# Patient Record
Sex: Female | Born: 1945 | Race: White | Hispanic: No | Marital: Married | Smoking: Former smoker
Health system: Southern US, Community
[De-identification: ages and names within clinical notes are randomized; demographics above are authoritative.]

## PROBLEM LIST (undated history)

## (undated) DIAGNOSIS — C50919 Malignant neoplasm of unspecified site of unspecified female breast: Secondary | ICD-10-CM

## (undated) DIAGNOSIS — E109 Type 1 diabetes mellitus without complications: Secondary | ICD-10-CM

## (undated) DIAGNOSIS — F419 Anxiety disorder, unspecified: Secondary | ICD-10-CM

## (undated) DIAGNOSIS — I10 Essential (primary) hypertension: Secondary | ICD-10-CM

## (undated) DIAGNOSIS — E222 Syndrome of inappropriate secretion of antidiuretic hormone: Secondary | ICD-10-CM

## (undated) DIAGNOSIS — I4891 Unspecified atrial fibrillation: Secondary | ICD-10-CM

## (undated) DIAGNOSIS — Z923 Personal history of irradiation: Secondary | ICD-10-CM

## (undated) DIAGNOSIS — E039 Hypothyroidism, unspecified: Secondary | ICD-10-CM

## (undated) DIAGNOSIS — I5030 Unspecified diastolic (congestive) heart failure: Secondary | ICD-10-CM

## (undated) DIAGNOSIS — N83209 Unspecified ovarian cyst, unspecified side: Secondary | ICD-10-CM

## (undated) DIAGNOSIS — Z9641 Presence of insulin pump (external) (internal): Secondary | ICD-10-CM

## (undated) DIAGNOSIS — E785 Hyperlipidemia, unspecified: Secondary | ICD-10-CM

## (undated) DIAGNOSIS — Z79811 Long term (current) use of aromatase inhibitors: Secondary | ICD-10-CM

## (undated) HISTORY — DX: Unspecified ovarian cyst, unspecified side: N83.209

## (undated) HISTORY — PX: TONSILLECTOMY: SUR1361

## (undated) HISTORY — PX: COLONOSCOPY: SHX174

## (undated) HISTORY — DX: Personal history of irradiation: Z92.3

## (undated) HISTORY — DX: Essential (primary) hypertension: I10

## (undated) HISTORY — DX: Syndrome of inappropriate secretion of antidiuretic hormone: E22.2

## (undated) HISTORY — PX: APPENDECTOMY: SHX54

## (undated) HISTORY — DX: Long term (current) use of aromatase inhibitors: Z79.811

## (undated) HISTORY — DX: Malignant neoplasm of unspecified site of unspecified female breast: C50.919

## (undated) HISTORY — PX: OVARIAN CYST SURGERY: SHX726

---

## 2010-11-06 ENCOUNTER — Encounter: Payer: Self-pay | Admitting: Otolaryngology

## 2012-01-25 ENCOUNTER — Other Ambulatory Visit: Payer: Self-pay | Admitting: Radiology

## 2012-01-25 DIAGNOSIS — C50912 Malignant neoplasm of unspecified site of left female breast: Secondary | ICD-10-CM

## 2012-01-28 ENCOUNTER — Other Ambulatory Visit: Payer: Self-pay | Admitting: *Deleted

## 2012-01-28 ENCOUNTER — Telehealth: Payer: Self-pay | Admitting: *Deleted

## 2012-01-28 DIAGNOSIS — C50419 Malignant neoplasm of upper-outer quadrant of unspecified female breast: Secondary | ICD-10-CM

## 2012-01-28 NOTE — Telephone Encounter (Signed)
Confirmed [facility] for 01/30/12 at 0800 .  Instructions and contact information given.  

## 2012-01-29 ENCOUNTER — Ambulatory Visit
Admission: RE | Admit: 2012-01-29 | Discharge: 2012-01-29 | Disposition: A | Discharge status: Home / Self Care | Payer: 59 | Source: Ambulatory Visit | Attending: Radiology | Admitting: Radiology

## 2012-01-29 DIAGNOSIS — C50912 Malignant neoplasm of unspecified site of left female breast: Secondary | ICD-10-CM

## 2012-01-29 MED ORDER — GADOBENATE DIMEGLUMINE 529 MG/ML IV SOLN
14.0000 mL | Freq: Once | INTRAVENOUS | Status: AC | PRN
  Administered 2012-01-29: 14 mL via INTRAVENOUS

## 2012-01-30 ENCOUNTER — Ambulatory Visit: Payer: 59 | Admitting: Oncology

## 2012-01-30 ENCOUNTER — Telehealth: Payer: Self-pay | Admitting: Oncology

## 2012-01-30 ENCOUNTER — Ambulatory Visit (HOSPITAL_BASED_OUTPATIENT_CLINIC_OR_DEPARTMENT_OTHER): Payer: 59 | Admitting: General Surgery

## 2012-01-30 ENCOUNTER — Encounter (INDEPENDENT_AMBULATORY_CARE_PROVIDER_SITE_OTHER): Payer: Self-pay | Admitting: General Surgery

## 2012-01-30 ENCOUNTER — Encounter: Payer: Self-pay | Admitting: Oncology

## 2012-01-30 ENCOUNTER — Ambulatory Visit: Payer: 59

## 2012-01-30 ENCOUNTER — Encounter: Payer: Self-pay | Admitting: Radiation Oncology

## 2012-01-30 ENCOUNTER — Encounter: Payer: Self-pay | Admitting: *Deleted

## 2012-01-30 ENCOUNTER — Ambulatory Visit
Admission: RE | Admit: 2012-01-30 | Discharge: 2012-01-30 | Disposition: A | Discharge status: Home / Self Care | Payer: Managed Care, Other (non HMO) | Source: Ambulatory Visit | Attending: Radiation Oncology | Admitting: Radiation Oncology

## 2012-01-30 ENCOUNTER — Other Ambulatory Visit: Payer: 59 | Admitting: Lab

## 2012-01-30 ENCOUNTER — Ambulatory Visit: Payer: Managed Care, Other (non HMO) | Attending: General Surgery | Admitting: Physical Therapy

## 2012-01-30 VITALS — BP 145/87 | HR 67 | Temp 98.1°F | Ht 63.0 in | Wt 152.8 lb

## 2012-01-30 DIAGNOSIS — E119 Type 2 diabetes mellitus without complications: Secondary | ICD-10-CM

## 2012-01-30 DIAGNOSIS — C50419 Malignant neoplasm of upper-outer quadrant of unspecified female breast: Secondary | ICD-10-CM

## 2012-01-30 DIAGNOSIS — R293 Abnormal posture: Secondary | ICD-10-CM | POA: Insufficient documentation

## 2012-01-30 DIAGNOSIS — C50919 Malignant neoplasm of unspecified site of unspecified female breast: Secondary | ICD-10-CM | POA: Insufficient documentation

## 2012-01-30 DIAGNOSIS — E7800 Pure hypercholesterolemia, unspecified: Secondary | ICD-10-CM

## 2012-01-30 DIAGNOSIS — IMO0001 Reserved for inherently not codable concepts without codable children: Secondary | ICD-10-CM | POA: Insufficient documentation

## 2012-01-30 DIAGNOSIS — Z170 Estrogen receptor positive status [ER+]: Secondary | ICD-10-CM

## 2012-01-30 LAB — CBC WITH DIFFERENTIAL/PLATELET
Basophils Absolute: 0.1 10*3/uL (ref 0.0–0.1)
Eosinophils Absolute: 0 10*3/uL (ref 0.0–0.5)
HGB: 13.1 g/dL (ref 11.6–15.9)
LYMPH%: 42.3 % (ref 14.0–49.7)
MONO#: 0.4 10*3/uL (ref 0.1–0.9)
NEUT#: 1.3 10*3/uL — ABNORMAL LOW (ref 1.5–6.5)
Platelets: 172 10*3/uL (ref 145–400)
RBC: 4.37 10*6/uL (ref 3.70–5.45)
WBC: 3.1 10*3/uL — ABNORMAL LOW (ref 3.9–10.3)
nRBC: 0 % (ref 0–0)

## 2012-01-30 LAB — COMPREHENSIVE METABOLIC PANEL
ALT: 29 U/L (ref 0–35)
CO2: 29 mEq/L (ref 19–32)
Calcium: 8.9 mg/dL (ref 8.4–10.5)
Chloride: 93 mEq/L — ABNORMAL LOW (ref 96–112)
Glucose, Bld: 286 mg/dL — ABNORMAL HIGH (ref 70–99)
Sodium: 130 mEq/L — ABNORMAL LOW (ref 135–145)
Total Protein: 6.7 g/dL (ref 6.0–8.3)

## 2012-01-30 NOTE — Progress Notes (Signed)
Gina [name] [id number] [id number] 66 y.o. 01/30/2012 12:04 PM  CC Dr. Glenna Fellows Dr. Lonie Peak Dr. Reather Littler Dr. 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 Patient is seen in the multidisciplinary clinic for discussion of treatment options.  STAGE:   Cancer of upper-outer quadrant of female breast   Primary site: Breast (Left)   Staging method: AJCC 7th Edition   Clinical: Stage IIA (T2, N0, cM0)   Summary: Stage IIA (T2, N0, cM0)  REFERRING PHYSICIAN: Dr. Sharlet Salina Hoxworth  HISTORY OF PRESENT ILLNESS:  Gina [name] is a 66 y.o. female.  With medical history significant for diabetes hypertension hypercholesterolemia. Patient has never had a mammogram previously. Recently she palpated a mass in her left breast. Because of this he she presented for a mammogram. The mammogram showed a spiculated mass in the upper outer quadrant of the left breast measuring about 1.8 cm. Subsequent ultrasound showed a well circumscribed hypoechoic mass at the 6:00 position 3 cm from the nipple. Patient went on to have a needle core biopsy performed on 01/24/2012. The pathology showed a invasive ductal carcinoma grade 1 ER positive PR positive HER-2/neu negative proliferation marker 10%. She had an MRI of the breasts performed. The MRI showed a round had true Beverely Low is an enhancing mass with spiculated margins in the 3:00 position of the left breast measuring 2.1 x 1.8 x 1.8 cm. There was no axillary or internal mammary adenopathy and no other masses or enhancement were seen in either breasts. Patient now presents to the multidisciplinary clinic for discussion of treatment options. Her case was discussed pathology and radiology was reviewed personally by me.She is without any complaints related to her breast cancer other than anxiety over her  treatment options.   Past Medical History: Past Medical History  Diagnosis Date  . Ovarian cyst   . Diabetes mellitus     Insulin pump  . Hypertension     Past Surgical History: Past Surgical History  Procedure Date  . Tonsillectomy     Family History: Family History  Problem Relation Age of Onset  . Cancer Mother     Social History History  Substance Use Topics  . Smoking status: Never Smoker   . Smokeless tobacco: Not on file  . Alcohol Use: No    Allergies: No Known Allergies  Current Medications: Current medications are reviewed and they are recorded separately into the electronic medical record.  OB/GYN History: Menarche at age 63 patient underwent menopause at 29. She was on hormone replacement therapy for a short while orally but then did not take it she has been using progesterone cream and will now discontinue it. She has had 3 term pregnancies first one at age 29. Her last colonoscopy was in 2011 bone density 2012 last Pap smear was about 5 years ago first mammogram was on 12/27/2011.  ECOG PERFORMANCE STATUS: 0 - Asymptomatic  Genetic Counseling/testing: Patient's family history is significant for mom with history of breast cancer at age 58 she died at 63 no other family history of malignancies. Therefore genetic counseling and testing has not been recommended at this time  REVIEW OF SYSTEMS:  Constitutional: positive for fatigue Ears, nose, mouth, throat, and face: positive for nasal congestion Respiratory: negative Cardiovascular: negative Gastrointestinal: negative Genitourinary:negative Integument/breast: positive for breast lump and breast tenderness Hematologic/lymphatic: negative Musculoskeletal:positive for arthralgias Neurological: negative Endocrine: positive for  diabetic symptoms including increased fatigue  PHYSICAL EXAMINATION: Blood pressure 145/87, pulse 67, temperature 98.1 F (36.7 C), temperature source Oral, height 5\' 3"  (1.6  m), weight 152 lb 12.8 oz (69.31 kg).  ZOX:WRUEA, healthy, no distress, well nourished and well developed SKIN: skin color, texture, turgor are normal HEAD: Normocephalic, No masses, lesions, tenderness or abnormalities EYES: PERRLA, EOMI, Conjunctiva are pink and non-injected, sclera clear EARS: External ears normal OROPHARYNX:no exudate, no erythema and lips, buccal mucosa, and tongue normal  NECK: no adenopathy, thyroid normal size, non-tender, without nodularity LYMPH:  no palpable lymphadenopathy, no hepatosplenomegaly BREAST:abnormal mass palpable In the left breast measuring about 2 cm there is area of ecchymosis from the biopsy site. LUNGS: clear to auscultation and percussion HEART: regular rate & rhythm, no murmurs and no gallops ABDOMEN:abdomen soft, non-tender, normal bowel sounds and no masses or organomegaly BACK: No CVA tenderness EXTREMITIES:no edema, no clubbing, no cyanosis  NEURO: alert & oriented x 3 with fluent speech, no focal motor/sensory deficits, gait normal, reflexes normal and symmetric   STUDIES/RESULTS: Mr Breast Bilateral W Wo Contrast  01/29/2012  *RADIOLOGY REPORT*  Clinical Data: New diagnosis left sided breast cancer  BILATERAL BREAST MRI WITH AND WITHOUT CONTRAST  Technique: Multiplanar, multisequence MR images of both breasts were obtained prior to and following the intravenous administration of 14ml of Multihance.  Three dimensional images were evaluated at the independent DynaCad workstation.  Comparison:  Mammogram dated 01/24/2012  Findings: Foci of nonspecific enhancement seen bilaterally.  A round, heterogeneously enhancing mass with spiculated margins is seen in the 3 o'clock position of the left breast, posteriorly measuring 2.1 x 1.8 x 1.8 cm.  A biopsy clip is seen in association with the mass.  No edema or enhancement is seen extending to the chest wall. No other suspicious mass or enhancement seen in either breast.  There is no axillary or  internal mammary adenopathy.  IMPRESSION: Known malignancy, left breast as detailed above.  No MRI specific evidence of malignancy, right breast.  THREE-DIMENSIONAL MR IMAGE RENDERING ON INDEPENDENT WORKSTATION:  Three-dimensional MR images were rendered by post-processing of the original MR data on an independent workstation.  The three- dimensional MR images were interpreted, and findings were reported in the accompanying complete MRI report for this study.  BI-RADS CATEGORY 6:  Known biopsy-proven malignancy - appropriate action should be taken.  Original Report Authenticated By: Hiram Gash, M.D.     LABS:    Chemistry      Component Value Date/Time   NA 130* 01/30/2012 0814   K 4.3 01/30/2012 0814   CL 93* 01/30/2012 0814   CO2 29 01/30/2012 0814   BUN 12 01/30/2012 0814   CREATININE 0.68 01/30/2012 0814      Component Value Date/Time   CALCIUM 8.9 01/30/2012 0814   ALKPHOS 85 01/30/2012 0814   AST 30 01/30/2012 0814   ALT 29 01/30/2012 0814   BILITOT 0.5 01/30/2012 0814      Lab Results  Component Value Date   WBC 3.1* 01/30/2012   HGB 13.1 01/30/2012   HCT 39.5 01/30/2012   MCV 90.4 01/30/2012   PLT 172 01/30/2012       PATHOLOGY: Needle core biopsy of left breast upper outer quadrant at 2:00 to 3:00 position revealing invasive ductal carcinoma grade 1 ER positive PR positive HER-2/neu negative proliferation marker 10%   ASSESSMENT    66 year old female with  #1 2.1 cm invasive ductal carcinoma of the left breast found on self  breast examination. Patient's clinical staging is T2, N0, M0 (stage II). Core needle biopsy revealed invasive ductal carcinoma grade 1 ER positive PR positive HER-2/neu negative proliferation marker 10%. Patient is seen in the multidisciplinary breast clinic for discussion of treatment options.  #2 diabetes patient is on insulin.  #3 hypertension hypercholesterolemia.  PLAN:    #1 patient and I discussed her pathology as well as radiology and  I gave her written material. Patient was recommended a lumpectomy with sentinel node biopsy. Post lumpectomy she would be a candidate for radiation therapy. Post Dr. Johna Sheriff and Dr. Doristine Devoid discussed these options respectively.  #2 adjuvant therapy was discussed. We discussed Oncotype DX 2 evaluated breast cancer recurrence score. Although my index of suspicion is that her recurrence score with most likely fall into the low risk category due to febrile goal prognostic features. We will order Oncotype DX however per patient wishes. Her antiestrogen therapy would most likely consist of an aromatase inhibitor. Risks and benefits of this were discussed with the patient as well. We discussed all 3 of the eyes. We also discussed tamoxifen.  #3 I spent extensive time discussing with the patient the standards of care for treatment of stage II hormone positive breast cancer. Per NCCN guidelines patient would be a candidate for endocrine therapy. Oncotype DX would evaluate whether she should receive chemotherapy or not.  #4 patient will be seen back in 4-5 weeks time after her surgery.        Thank you so much for allowing me to participate in the care of [facility]. I will continue to follow up the patient with you and assist in her care.  All questions were answered. The patient knows to call the clinic with any problems, questions or concerns. We can certainly see the patient much sooner if necessary.  I spent 60 minutes counseling the patient face to face. The total time spent in the appointment was 60 minutes.  Drue Second, MD Medical/Oncology [facility] [phone] (beeper) [phone] (Office)  01/30/2012, 12:04 PM 01/30/2012, 12:04 PM

## 2012-01-30 NOTE — Progress Notes (Signed)
[facility] Radiation Oncology NEW PATIENT EVALUATION  Name: Gina [name] MRN: [id number]  Date:   01/30/2012           DOB: August 14, 1946  Status: outpatient  REFERRING PHYSICIAN: Hoxworth, Lorne Skeens, MD   DIAGNOSIS: Clinical T2, N0, M0 Left breast cancer, upper outer quadrant, invasive ductal carcinoma, ER/PR positive HER-2/neu negative, grade 1    HISTORY OF PRESENT ILLNESS:  Gina [name] is a 66 y.o. female who self palpated a lump in her left breast about 16 months ago. She underwent her first mammogram to work this up in 12/27/2011. This demonstrated a  suspicious spiculated mass in the upper-outer quadrant of the left breast. On mammography it was 1.8 cm in greatest dimension. There was also a small circumscribed lesion appreciated in the right breast that was felt to be benign but malignancy could not be excluded. The patient underwent biopsies of both of these sites on 01/24/2012. The left lesion was biopsied by core needle and consistent with invasive ductal carcinoma. This was  grade 1 ER/PR positive HER-2/neu negative. The right lesion was biopsied by core needle biopsy and found to be a fibroadenoma  The patient is status post MRI of her breasts and 01/29/2012. It demonstrates a 2.1 x 1.8 x 1.8 cm in the 3:00 position of the left breast. There are no other suspicious masses in either breast nor in the axillary or internal mammary adenopathy.  She is otherwise in her usual state of health.  PREVIOUS RADIATION THERAPY: No   PAST MEDICAL HISTORY:  has a past medical history of Ovarian cyst; Diabetes mellitus; and Hypertension.  her diabetes is type I.   Gynecologic history: the patient has been applying a progesterone cream to her chest for about 15 years or more. She was on oral hormonal therapy for less than one month prior to using this cream. Age of first menstrual period was 50 age at last period was 50; she has carried 3 children at term; her age at first live  birth was 28.   PAST SURGICAL HISTORY:  Past Surgical History  Procedure Date  . Tonsillectomy      FAMILY HISTORY: family history includes Cancer in her mother.   SOCIAL HISTORY:  reports that she has never smoked. She does not have any smokeless tobacco history on file. She reports that she does not drink alcohol or use illicit drugs.   ALLERGIES: Review of patient's allergies indicates no known allergies.   MEDICATIONS:  Current Outpatient Prescriptions  Medication Sig Dispense Refill  . alendronate (FOSAMAX) 70 MG tablet Take 70 mg by mouth every 7 (seven) days. Take with a full glass of water on an empty stomach.      Marland Kitchen aspirin 81 MG tablet Take 81 mg by mouth daily.      . calcium carbonate (TUMS - DOSED IN MG ELEMENTAL CALCIUM) 500 MG chewable tablet Chew 1 tablet by mouth daily.      . cholecalciferol (VITAMIN D) 1000 UNITS tablet Take 2,000 Units by mouth daily.      Marland Kitchen co-enzyme Q-10 50 MG capsule Take 400 mg by mouth daily.      Marland Kitchen glucosamine-chondroitin 500-400 MG tablet Take 1 tablet by mouth 3 (three) times daily.      . insulin lispro (HUMALOG) 100 UNIT/ML injection Inject into the skin. 23.25 units basal 1:C ratio 1U to 10 Carbs      . Methylsulfonylmethane (MSM PO) Take 2,000 each by mouth daily.      Marland Kitchen  niacin (NIASPAN) 500 MG CR tablet Take 500 mg by mouth at bedtime.      . NON FORMULARY Take 600 mg by mouth daily. Tuneric Extract      . olmesartan (BENICAR) 20 MG tablet Take 10 mg by mouth daily.      Marland Kitchen UNABLE TO FIND Take 40 mg by mouth daily. Med Name: Phosphorus      . vitamin A 16109 UNIT capsule Take 10,000 Units by mouth daily.         REVIEW OF SYSTEMS:  A 14 point comprehensive review of systems was obtained and reviewed with the patient placed in her chart. It is notable for that above.    PHYSICAL EXAM:  vitals were not taken for this visit.  General: Alert and oriented, in no acute distress HEENT: Head is normocephalic. Pupils are equally round  and reactive to light. Extraocular movements are intact. Oropharynx is clear. Neck: Neck is supple, no palpable cervical or supraclavicular lymphadenopathy. Heart: Regular in rate and rhythm with no murmurs, rubs, or gallops. Chest: Clear to auscultation bilaterally, with no rhonchi, wheezes, or rales. Abdomen: Soft, nontender, nondistended, with no rigidity or guarding. Extremities: No cyanosis or edema. Lymphatics: No concerning lymphadenopathy. Skin: No concerning lesions. Musculoskeletal: symmetric strength and muscle tone throughout. Neurologic: Cranial nerves II through XII are grossly intact. No obvious focalities. Speech is fluent. Coordination is intact. Psychiatric: Judgment and insight are intact. Affect is appropriate. Breasts: In the 2:00 position - the left upper outer quadrant - there is a palpable abnormality corresponding to the known lesion. It is approximately 2 cm in greatest dimension. The right breast is free of any palpable abnormalities. There are no suspicious axillary lymph nodes bilaterally    LABORATORY DATA:  Lab Results  Component Value Date   WBC 3.1* 01/30/2012   HGB 13.1 01/30/2012   HCT 39.5 01/30/2012   MCV 90.4 01/30/2012   PLT 172 01/30/2012    CMP     Component Value Date/Time   NA 130* 01/30/2012 0814   K 4.3 01/30/2012 0814   CL 93* 01/30/2012 0814   CO2 29 01/30/2012 0814   GLUCOSE 286* 01/30/2012 0814   BUN 12 01/30/2012 0814   CREATININE 0.68 01/30/2012 0814   CALCIUM 8.9 01/30/2012 0814   PROT 6.7 01/30/2012 0814   ALBUMIN 3.7 01/30/2012 0814   AST 30 01/30/2012 0814   ALT 29 01/30/2012 0814   ALKPHOS 85 01/30/2012 0814   BILITOT 0.5 01/30/2012 0814     PATHOLOGY: As above   RADIOLOGY: As above    IMPRESSION/PLAN: This is a lovely 66 year old woman with T2, N0, M0 ER/PR positive HER-2/neu negative grade 1 left breast cancer. I spoke to her about mastectomy versus breast conservation.  I explained that both approaches have been found by  randomized data to procure equal overall survival. In the setting of breast conservation, radiotherapy would decrease her risk of a local recurrence by about two thirds compared to lumpectomy alone. The patient and her family had many thoughtful questions about radiotherapy in the adjuvant setting of lumpectomy.  All of their questions were answered today but they will be given  my contact information should more questions arise.  It was a pleasure meeting the patient today. We discussed the risks, benefits, and side effects of radiotherapy. We discussed that radiation would take approximately 6 weeks to complete.  We spoke about acute effects including skin irritation and fatigue as well as much less common late effects including  lung and heart irritation. We spoke about the latest technology that is used to minimize the risk of late effects for breast cancer patients undergoing radiotherapy. No guarantees of treatment were given.  I sense that she is leaning towards breast conservation but as of the end of our consultation, the patient expressed that she would like to give this some more thought. She will meet with our medical oncologist and surgeon later this morning to help clarify her decision.   Of note, I recommended that she stop the progesterone cream immediately in light of her hormone receptor positive cancer. She is amenable to this recommendation.

## 2012-01-30 NOTE — Telephone Encounter (Signed)
gve the pt her may 2013 appt calendar 

## 2012-01-30 NOTE — Progress Notes (Signed)
Mailed after appt letter to pt. 

## 2012-01-30 NOTE — Progress Notes (Signed)
Subjective:   new diagnosis cancer of left breast  Patient ID: Gina [name], female   DOB: 04/08/1946, 66 y.o.   MRN: [id number]  HPI Patient is a pleasant 66 year old female seen today in the breast multidisciplinary clinic for a new diagnosis of invasive left breast cancer, referred by Dr. Margy [name] at [facility] the patient states that she was able to initially feel a lump in her left breast at least several months ago. It has not changed since she noticed it. She recently saw Dr. Tanya [name] and was referred for further imaging and diagnosis. She has not noted any skin changes or nipple discharge or pain or other worrisome symptoms. Diagnostic mammogram was performed which revealed a 1.8 cm spiculated mass in the upper outer quadrant of the left breast. An 8 mm mass was also noted in the right retroareolar area. Ultrasound on the left showed an ill-defined 1.5 cm hypoechoic mass. Ultrasound-guided core biopsy was recommended and performed on both sides. The right revealed a benign fibroadenoma. The left revealed invasive ductal carcinoma, grade 1 ER PR positive. Subsequent breast MRI has been performed which has revealed a mass posteriorly at the 3:00 position in the left breast with greatest diameter of 2.1 cm. There was no extension to the chest wall or any abnormal lymph nodes seen. The patient is now here for further treatment planning.  Past Medical History  Diagnosis Date  . Ovarian cyst   . Diabetes mellitus     Insulin pump  . Hypertension    Past Surgical History  Procedure Date  . Tonsillectomy        Review of Systems  Constitutional: Negative.   HENT: Positive for hearing loss and tinnitus.   Eyes: Negative.   Respiratory: Negative.   Cardiovascular: Negative.   Gastrointestinal: Negative.        Colonoscopy up to date  Genitourinary: Negative.   Musculoskeletal: Negative.   Psychiatric/Behavioral: Negative.        Objective:   Physical Exam General: Alert,  well-developed Caucasian female, in no distress Skin: Warm and dry without rash or infection. HEENT: No palpable masses or thyromegaly. Sclera nonicteric. Pupils equal round and reactive. Oropharynx clear. Lymph nodes: No cervical, supraclavicular, or inguinal nodes palpable. Breasts: In the lateral aspect of the left breast posteriorly at the 3:00 position is an approximately 1-1/2 cm palpable freely movable mass. Slight bruising from the biopsy. No other palpable masses or skin changes Lungs: Breath sounds clear and equal without increased work of breathing Cardiovascular: Regular rate and rhythm without murmur. No JVD or edema. Peripheral pulses intact. Abdomen: Nondistended. Soft and nontender. No masses palpable. No organomegaly. No palpable hernias. Extremities: No edema or joint swelling or deformity. No chronic venous stasis changes. Neurologic: Alert and fully oriented. Gait normal.    Assessment:     New diagnosis of approximately 2 cm invasive ductal carcinoma of the left breast. We discussed initial surgical treatment options in detail today. I believe she would be a good candidate for breast conservation with lumpectomy and sentinel lymph node biopsy. We discussed this procedure in detail including its indications in nature and recovery as well as risks of bleeding, infection, and possible need for further surgery based on pathology findings. We discussed risks of anesthesia. We discussed total mastectomy as an option and discussed the advantages and disadvantages of each treatment course. We discussed that reconstruction is an option should she choose mastectomy. We discussed in general terms and treatment radiation and hormonal treatment. All  her questions were answered. She would like a little time to consider her options and will be back in touch with [place] to schedule initial surgical treatment.    Plan:     The patient is considering initial surgical treatment options and will be back  in touch with [place] in the near future to schedule surgery.

## 2012-01-30 NOTE — Progress Notes (Signed)
Patient came in today as a new patient with her husband ,she has one [employer] streamer.I did explain to her our financial assistance program and our co-pay assistance,she said she would call us if she need some assistance,so I gave her [employer] Drought cards just in case she had any question.

## 2012-01-30 NOTE — Patient Instructions (Signed)
1. I will see you back after surgery 2. Oncotype Dx ordered

## 2012-02-04 ENCOUNTER — Encounter: Payer: 59 | Admitting: Genetic Counselor

## 2012-02-04 ENCOUNTER — Telehealth: Payer: Self-pay | Admitting: *Deleted

## 2012-02-04 ENCOUNTER — Telehealth (INDEPENDENT_AMBULATORY_CARE_PROVIDER_SITE_OTHER): Payer: Self-pay | Admitting: General Surgery

## 2012-02-04 ENCOUNTER — Other Ambulatory Visit: Payer: 59 | Admitting: Lab

## 2012-02-04 ENCOUNTER — Other Ambulatory Visit (INDEPENDENT_AMBULATORY_CARE_PROVIDER_SITE_OTHER): Payer: Self-pay | Admitting: General Surgery

## 2012-02-04 DIAGNOSIS — C50919 Malignant neoplasm of unspecified site of unspecified female breast: Secondary | ICD-10-CM

## 2012-02-04 NOTE — Telephone Encounter (Signed)
Spoke to pt concerning [facility] from 01/30/12.  Pt relate she is concerned about radiation and is "leaning toward having surgery, skipping radiation, and taking the pill".  Pt also relate she would like to have surgery after May 15th due to CE with work.  I have called [facility] scheduling to inform of pt request.  Told pt we will r/s her f/u appt with Dr. Welton Flakes after surgery has been r/s d/t her oncotype results will not be back in time for 5/24 f/u with Dr. Welton Flakes.  Received verbal understanding.  Informed pt I would relay her concerns to Dr. Basilio Cairo regarding radiation.  Encourage pt to call with further questions or concerns.  Pt denies questions r/t dx.  Contact information given.

## 2012-02-06 ENCOUNTER — Telehealth: Payer: Self-pay | Admitting: *Deleted

## 2012-02-06 ENCOUNTER — Encounter: Payer: Self-pay | Admitting: *Deleted

## 2012-02-06 NOTE — Telephone Encounter (Signed)
Spoke to pt regarding change in f/u appt with Dr. Welton Flakes.  Informed pt that her 5/24 appt is being r/s to 6/11 d/t surgery date and oncotype results taking 2 wks to return.  Received verbal confirmation of f/u date.  Pt relate she has questions regarding radiation and heart irritation.  I will inform Dr. Basilio Cairo of patients concerns.  Pt denies further needs at this time.  Encourage pt to call with further questions.  Contact information given.

## 2012-02-07 ENCOUNTER — Telehealth: Payer: Self-pay | Admitting: Oncology

## 2012-02-07 NOTE — Telephone Encounter (Signed)
S/w the pt and she is aware of her md appt that has been r/s from may to June 11th

## 2012-02-12 NOTE — Telephone Encounter (Signed)
Written OR posting face sheet given to Amy M. In scheduling.

## 2012-02-13 ENCOUNTER — Telehealth: Payer: Self-pay | Admitting: *Deleted

## 2012-02-13 NOTE — Telephone Encounter (Signed)
Called patient to reschedule the genetics appt for 5/2 due to Gina [name] unable to be here and I confirmed 02/21/12 genetics appt w/ pt.

## 2012-02-14 ENCOUNTER — Other Ambulatory Visit: Payer: 59 | Admitting: Lab

## 2012-02-14 ENCOUNTER — Encounter: Payer: 59 | Admitting: Genetic Counselor

## 2012-02-21 ENCOUNTER — Other Ambulatory Visit: Payer: Self-pay

## 2012-02-21 ENCOUNTER — Ambulatory Visit (HOSPITAL_BASED_OUTPATIENT_CLINIC_OR_DEPARTMENT_OTHER): Payer: Managed Care, Other (non HMO) | Admitting: Genetic Counselor

## 2012-02-21 DIAGNOSIS — C50419 Malignant neoplasm of upper-outer quadrant of unspecified female breast: Secondary | ICD-10-CM

## 2012-02-22 ENCOUNTER — Encounter: Payer: Self-pay | Admitting: Genetic Counselor

## 2012-02-22 NOTE — Progress Notes (Signed)
Dr.  Welton Flakes requested a consultation for genetic counseling and risk assessment for Gina [name], a 66 y.o. female, for discussion of her personal history of breast cancer. She presents to clinic today with her daughters to discuss the possibility of a genetic predisposition to cancer, and to further clarify her risks, as well as her family members' risks for cancer.   HISTORY OF PRESENT ILLNESS: In April 2013, at the age of 45, Melessa Cowell was diagnosed with invasive ductal carcinoma.  Surgery is scheduled for Mar 05, 2012.    Past Medical History  Diagnosis Date  . Ovarian cyst   . Diabetes mellitus     Insulin pump  . Hypertension   . Breast cancer     Past Surgical History  Procedure Date  . Tonsillectomy     History  Substance Use Topics  . Smoking status: Never Smoker   . Smokeless tobacco: Not on file  . Alcohol Use: No    REPRODUCTIVE HISTORY AND PERSONAL RISK ASSESSMENT FACTORS: Menarche was at age 66.   Menopause was at age 77 Uterus Intact: Yes Ovaries Intact: Yes G2P3A0 , first live birth at age 73  She has not previously undergone treatment for infertility.   No OCP use   She has used HRT in the past.    FAMILY HISTORY:  We obtained a detailed, 4-generation family history.  Significant diagnoses are listed below: Family History  Problem Relation Age of Onset  . Breast cancer Mother 80  . Prostate cancer Maternal Uncle     died in his 80s  The patient was diagnosed with breast cancer at age 24.  There is no reported cancer history on her fathers side of the family.  Her mother was diagnosed with breast cancer at age 53 and died at 86.  Her mother has 8 full siblings and multiple paternal half siblings.  One full brother was diagnosed with prostate cancer and died in his 59s.  The patient's maternal grandmother had a lumpectomy, but it was unknown if it was cancerous.  There is no other reported cancer history on this side of the family.  Patient's  maternal ancestors are of [place] and [place] descent, and paternal ancestors are of [place] descent. There is no reported Ashkenazi Jewish ancestry. There is no known consanguinity.  GENETIC COUNSELING RISK ASSESSMENT, DISCUSSION, AND SUGGESTED FOLLOW UP: We reviewed the natural history and genetic etiology of sporadic, familial and hereditary cancer syndromes. About 5-10% of breast cancer is hereditary and of this, about 85% is the result of mutations in the BRCA1 or BRCA2 genes.  We discussed the red flags of hereditary cancer syndromes and the dominant inheritance pattern.  Currently, she does not meet guidelines for testing for BRCA mutations.  The family will look further into the family history to determine if others in the family had breast, ovarian or pancreatic cancer.  The patient's personal and family history of cancer is not suggestive, at this time, of a hereditary cancer syndrome.  We discussed that, if additional family history indicates that they are at increased risk for a hereditary cancer syndrome, genetic testing could be performed.  Identification of a hereditary cancer syndrome may help her care providers tailor the patients medical management. If a mutation indicating a hereditary cancer syndrome is detected in this case, the [employer] recommendations would include increased cancer screening and possible prophylactic surgery. If a mutation is detected, the patient will be referred back to the referring provider  and to any additional appropriate care providers to discuss the relevant options.   If a mutation is not found in the patient, this will decrease the likelihood of hereditary cancer sydnrome as the explanation for her breast cancer. Cancer surveillance options would be discussed for the patient according to the appropriate standard National Comprehensive Cancer Network and American Cancer Society guidelines, with consideration of their personal  and family history risk factors. In this case, the patient will be referred back to their care providers for discussions of management.   In order to estimate her chance of having a BRCA1 or BRCA2 mutation, we used statistical models (Penn II) and laboratory data that take into account her personal medical history, family history and ancestry.  Because each model is different, there can be a lot of variability in the risks they give.  Therefore, these numbers must be considered a rough range and not a precise risk of having a BRCA1 or BRCA2 mutation.  These models estimate that she has approximately a 7% chance of having a mutation. Specifically, there is about a 2% chance for a BRCA1 mutation and a 5% chance for a BRCA2 mutation.    After considering the risks, benefits, and limitations, the patient declined genetic testing at this time.   The patient was seen for a total of 60 minutes, greater than 50% of which was spent face-to-face counseling.  This plan is being carried out per Dr. Milta Deiters recommendations.  This note will also be sent to the referring provider via the electronic medical record. The patient will be supplied with a summary of this genetic counseling discussion as well as educational information on the discussed hereditary cancer syndromes following the conclusion of their visit.   Patient was discussed with Dr. Drue Second. _______________________________________________________________________ For Office Staff:  Number of people involved in session: 4 Was an Intern/ student involved with case: no

## 2012-02-28 ENCOUNTER — Encounter (HOSPITAL_BASED_OUTPATIENT_CLINIC_OR_DEPARTMENT_OTHER): Payer: Self-pay | Admitting: *Deleted

## 2012-02-28 NOTE — Progress Notes (Signed)
To come in for bmet and ekg-no resp problems Still works full time- Insulin pump cbg usually-100 or more in am-have good hs protein snack pm prior to surg-

## 2012-02-29 ENCOUNTER — Encounter: Payer: Self-pay | Admitting: Radiation Oncology

## 2012-02-29 ENCOUNTER — Encounter (HOSPITAL_BASED_OUTPATIENT_CLINIC_OR_DEPARTMENT_OTHER)
Admission: RE | Admit: 2012-02-29 | Discharge: 2012-02-29 | Disposition: A | Discharge status: Home / Self Care | Payer: Managed Care, Other (non HMO) | Source: Ambulatory Visit | Attending: General Surgery | Admitting: General Surgery

## 2012-02-29 LAB — BASIC METABOLIC PANEL
BUN: 8 mg/dL (ref 6–23)
CO2: 30 mEq/L (ref 19–32)
Calcium: 9.3 mg/dL (ref 8.4–10.5)
GFR calc non Af Amer: >90 mL/min (ref >90)
Glucose, Bld: 144 mg/dL — ABNORMAL HIGH (ref 70–99)
Sodium: 132 mEq/L — ABNORMAL LOW (ref 135–145)

## 2012-02-29 NOTE — Progress Notes (Signed)
Gina [name] has had numerous questions and concerns that I have addressed with her by phone conversations on two occasions since her consultation . All questions were answered each time to her satisfaction.  Since these discussions, she brought up more concerns with [name]. I have left messages on her home and work voicemail encouraging her to call me back today to discuss this further, in hope that I can be of help to her.

## 2012-03-05 ENCOUNTER — Ambulatory Visit (HOSPITAL_BASED_OUTPATIENT_CLINIC_OR_DEPARTMENT_OTHER)
Admission: RE | Admit: 2012-03-05 | Discharge: 2012-03-05 | Disposition: A | Discharge status: Home / Self Care | Payer: Managed Care, Other (non HMO) | Source: Ambulatory Visit | Attending: General Surgery | Admitting: General Surgery

## 2012-03-05 ENCOUNTER — Ambulatory Visit (HOSPITAL_COMMUNITY)
Admission: RE | Admit: 2012-03-05 | Discharge: 2012-03-05 | Disposition: A | Discharge status: Home / Self Care | Payer: Managed Care, Other (non HMO) | Source: Ambulatory Visit | Attending: General Surgery | Admitting: General Surgery

## 2012-03-05 ENCOUNTER — Encounter (HOSPITAL_BASED_OUTPATIENT_CLINIC_OR_DEPARTMENT_OTHER): Payer: Self-pay | Admitting: *Deleted

## 2012-03-05 ENCOUNTER — Ambulatory Visit (HOSPITAL_BASED_OUTPATIENT_CLINIC_OR_DEPARTMENT_OTHER): Payer: Managed Care, Other (non HMO) | Admitting: *Deleted

## 2012-03-05 ENCOUNTER — Encounter (HOSPITAL_BASED_OUTPATIENT_CLINIC_OR_DEPARTMENT_OTHER)
Admission: RE | Disposition: A | Discharge status: Home / Self Care | Payer: Self-pay | Source: Ambulatory Visit | Attending: General Surgery

## 2012-03-05 DIAGNOSIS — Z01812 Encounter for preprocedural laboratory examination: Secondary | ICD-10-CM | POA: Insufficient documentation

## 2012-03-05 DIAGNOSIS — C50919 Malignant neoplasm of unspecified site of unspecified female breast: Secondary | ICD-10-CM | POA: Insufficient documentation

## 2012-03-05 DIAGNOSIS — Z01810 Encounter for preprocedural cardiovascular examination: Secondary | ICD-10-CM | POA: Insufficient documentation

## 2012-03-05 DIAGNOSIS — I10 Essential (primary) hypertension: Secondary | ICD-10-CM | POA: Insufficient documentation

## 2012-03-05 DIAGNOSIS — D0590 Unspecified type of carcinoma in situ of unspecified breast: Secondary | ICD-10-CM

## 2012-03-05 DIAGNOSIS — C50419 Malignant neoplasm of upper-outer quadrant of unspecified female breast: Secondary | ICD-10-CM

## 2012-03-05 DIAGNOSIS — C773 Secondary and unspecified malignant neoplasm of axilla and upper limb lymph nodes: Secondary | ICD-10-CM | POA: Insufficient documentation

## 2012-03-05 DIAGNOSIS — Z794 Long term (current) use of insulin: Secondary | ICD-10-CM | POA: Insufficient documentation

## 2012-03-05 HISTORY — PX: BREAST LUMPECTOMY: SHX2

## 2012-03-05 HISTORY — DX: Malignant neoplasm of unspecified site of unspecified female breast: C50.919

## 2012-03-05 HISTORY — DX: Presence of insulin pump (external) (internal): Z96.41

## 2012-03-05 LAB — POCT HEMOGLOBIN-HEMACUE: Hemoglobin: 13.5 g/dL (ref 12.0–15.0)

## 2012-03-05 SURGERY — BREAST LUMPECTOMY WITH AXILLARY LYMPH NODE BIOPSY
Anesthesia: General | Laterality: Left | Wound class: Clean

## 2012-03-05 MED ORDER — BUPIVACAINE-EPINEPHRINE 0.25% -1:200000 IJ SOLN
INTRAMUSCULAR | Status: DC | PRN
  Administered 2012-03-05: 20 mL

## 2012-03-05 MED ORDER — FENTANYL CITRATE 0.05 MG/ML IJ SOLN
50.0000 ug | INTRAMUSCULAR | Status: DC | PRN
  Administered 2012-03-05: 50 ug via INTRAVENOUS

## 2012-03-05 MED ORDER — METHYLENE BLUE 1 % INJ SOLN
INTRAMUSCULAR | Status: DC | PRN
  Administered 2012-03-05: 2 mL via SUBMUCOSAL

## 2012-03-05 MED ORDER — EPHEDRINE SULFATE 50 MG/ML IJ SOLN
INTRAMUSCULAR | Status: DC | PRN
  Administered 2012-03-05 (×2): 5 mg via INTRAVENOUS
  Administered 2012-03-05: 10 mg via INTRAVENOUS

## 2012-03-05 MED ORDER — SODIUM CHLORIDE 0.9 % IJ SOLN
INTRAMUSCULAR | Status: DC | PRN
  Administered 2012-03-05: 3 mL

## 2012-03-05 MED ORDER — ACETAMINOPHEN 10 MG/ML IV SOLN
1000.0000 mg | Freq: Once | INTRAVENOUS | Status: AC
  Administered 2012-03-05: 1000 mg via INTRAVENOUS

## 2012-03-05 MED ORDER — LIDOCAINE HCL (CARDIAC) 20 MG/ML IV SOLN
INTRAVENOUS | Status: DC | PRN
  Administered 2012-03-05: 40 mg via INTRAVENOUS

## 2012-03-05 MED ORDER — ONDANSETRON HCL 4 MG/2ML IJ SOLN
INTRAMUSCULAR | Status: DC | PRN
  Administered 2012-03-05: 4 mg via INTRAVENOUS

## 2012-03-05 MED ORDER — OXYCODONE HCL 5 MG PO TABS
5.0000 mg | ORAL_TABLET | Freq: Once | ORAL | Status: AC
  Administered 2012-03-05: 5 mg via ORAL

## 2012-03-05 MED ORDER — LACTATED RINGERS IV SOLN
INTRAVENOUS | Status: DC
  Administered 2012-03-05 (×3): via INTRAVENOUS

## 2012-03-05 MED ORDER — CHLORHEXIDINE GLUCONATE 4 % EX LIQD: 1.0000 "application " | Freq: Once | CUTANEOUS | Status: DC

## 2012-03-05 MED ORDER — METOCLOPRAMIDE HCL 5 MG/ML IJ SOLN: 10.0000 mg | Freq: Once | INTRAMUSCULAR | Status: DC | PRN

## 2012-03-05 MED ORDER — FENTANYL CITRATE 0.05 MG/ML IJ SOLN
INTRAMUSCULAR | Status: DC | PRN
  Administered 2012-03-05: 50 ug via INTRAVENOUS
  Administered 2012-03-05: 25 ug via INTRAVENOUS

## 2012-03-05 MED ORDER — CEFAZOLIN SODIUM 1-5 GM-% IV SOLN
1.0000 g | INTRAVENOUS | Status: AC
  Administered 2012-03-05: 1 g via INTRAVENOUS

## 2012-03-05 MED ORDER — DEXAMETHASONE SODIUM PHOSPHATE 4 MG/ML IJ SOLN
INTRAMUSCULAR | Status: DC | PRN
  Administered 2012-03-05: 4 mg via INTRAVENOUS

## 2012-03-05 MED ORDER — PROPOFOL 10 MG/ML IV EMUL
INTRAVENOUS | Status: DC | PRN
  Administered 2012-03-05: 200 mg via INTRAVENOUS

## 2012-03-05 MED ORDER — HYDROMORPHONE HCL PF 1 MG/ML IJ SOLN
0.2500 mg | INTRAMUSCULAR | Status: DC | PRN
  Administered 2012-03-05: 0.5 mg via INTRAVENOUS
  Administered 2012-03-05: 0.25 mg via INTRAVENOUS
  Administered 2012-03-05: 0.5 mg via INTRAVENOUS
  Administered 2012-03-05: 0.25 mg via INTRAVENOUS

## 2012-03-05 MED ORDER — MIDAZOLAM HCL 2 MG/2ML IJ SOLN
0.5000 mg | INTRAMUSCULAR | Status: DC | PRN
  Administered 2012-03-05: 1 mg via INTRAVENOUS

## 2012-03-05 MED ORDER — TECHNETIUM TC 99M SULFUR COLLOID FILTERED
1.0000 | Freq: Once | INTRAVENOUS | Status: AC | PRN
  Administered 2012-03-05: 1 via INTRADERMAL

## 2012-03-05 MED ORDER — METOCLOPRAMIDE HCL 5 MG/ML IJ SOLN
INTRAMUSCULAR | Status: DC | PRN
  Administered 2012-03-05: 10 mg via INTRAVENOUS

## 2012-03-05 SURGICAL SUPPLY — 62 items
ADH SKN CLS APL DERMABOND .7 (GAUZE/BANDAGES/DRESSINGS) ×2
APPLIER CLIP 11 MED OPEN (CLIP)
APR CLP MED 11 20 MLT OPN (CLIP)
BINDER BREAST LRG (GAUZE/BANDAGES/DRESSINGS) ×1 IMPLANT
BLADE SURG 10 STRL SS (BLADE) ×2 IMPLANT
BLADE SURG 15 STRL LF DISP TIS (BLADE) ×1 IMPLANT
BLADE SURG 15 STRL SS (BLADE) ×2
CANISTER SUCTION 1200CC (MISCELLANEOUS) ×2 IMPLANT
CHLORAPREP W/TINT 26ML (MISCELLANEOUS) ×2 IMPLANT
CLIP APPLIE 11 MED OPEN (CLIP) IMPLANT
CLIP TI WIDE RED SMALL 6 (CLIP) ×3 IMPLANT
CLOTH BEACON ORANGE TIMEOUT ST (SAFETY) ×2 IMPLANT
COVER MAYO STAND STRL (DRAPES) ×2 IMPLANT
COVER PROBE W GEL 5X96 (DRAPES) ×2 IMPLANT
COVER TABLE BACK 60X90 (DRAPES) ×2 IMPLANT
DECANTER SPIKE VIAL GLASS SM (MISCELLANEOUS) IMPLANT
DERMABOND ADVANCED (GAUZE/BANDAGES/DRESSINGS) ×2
DERMABOND ADVANCED .7 DNX12 (GAUZE/BANDAGES/DRESSINGS) ×2 IMPLANT
DEVICE DUBIN W/COMP PLATE 8390 (MISCELLANEOUS) ×2 IMPLANT
DRAIN CHANNEL 19F RND (DRAIN) IMPLANT
DRAIN HEMOVAC 1/8 X 5 (WOUND CARE) IMPLANT
DRAPE LAPAROSCOPIC ABDOMINAL (DRAPES) ×2 IMPLANT
DRAPE UTILITY XL STRL (DRAPES) ×3 IMPLANT
ELECT COATED BLADE 2.86 ST (ELECTRODE) ×2 IMPLANT
ELECT REM PT RETURN 9FT ADLT (ELECTROSURGICAL) ×2
ELECTRODE REM PT RTRN 9FT ADLT (ELECTROSURGICAL) ×1 IMPLANT
EVACUATOR SILICONE 100CC (DRAIN) IMPLANT
GLOVE BIOGEL PI IND STRL 8 (GLOVE) ×1 IMPLANT
GLOVE BIOGEL PI INDICATOR 8 (GLOVE) ×1
GLOVE SKINSENSE NS SZ7.0 (GLOVE) ×2
GLOVE SKINSENSE STRL SZ7.0 (GLOVE) IMPLANT
GLOVE SS BIOGEL STRL SZ 7.5 (GLOVE) ×1 IMPLANT
GLOVE SUPERSENSE BIOGEL SZ 7.5 (GLOVE) ×1
GOWN PREVENTION PLUS XLARGE (GOWN DISPOSABLE) ×3 IMPLANT
GOWN PREVENTION PLUS XXLARGE (GOWN DISPOSABLE) ×2 IMPLANT
KIT MARKER MARGIN INK (KITS) ×2 IMPLANT
NDL HYPO 25X1 1.5 SAFETY (NEEDLE) ×2 IMPLANT
NDL SAFETY ECLIPSE 18X1.5 (NEEDLE) ×1 IMPLANT
NEEDLE HYPO 18GX1.5 SHARP (NEEDLE) ×2
NEEDLE HYPO 25X1 1.5 SAFETY (NEEDLE) ×6 IMPLANT
NS IRRIG 1000ML POUR BTL (IV SOLUTION) ×2 IMPLANT
PACK BASIN DAY SURGERY FS (CUSTOM PROCEDURE TRAY) ×2 IMPLANT
PAD ALCOHOL SWAB (MISCELLANEOUS) ×2 IMPLANT
PENCIL BUTTON HOLSTER BLD 10FT (ELECTRODE) ×2 IMPLANT
PIN SAFETY STERILE (MISCELLANEOUS) IMPLANT
SLEEVE SCD COMPRESS KNEE MED (MISCELLANEOUS) IMPLANT
SPONGE LAP 18X18 X RAY DECT (DISPOSABLE) IMPLANT
SPONGE LAP 4X18 X RAY DECT (DISPOSABLE) ×2 IMPLANT
STAPLER VISISTAT 35W (STAPLE) ×1 IMPLANT
SUT ETHILON 3 0 FSL (SUTURE) IMPLANT
SUT MON AB 4-0 PC3 18 (SUTURE) IMPLANT
SUT MON AB 5-0 PS2 18 (SUTURE) ×2 IMPLANT
SUT SILK 3 0 SH 30 (SUTURE) IMPLANT
SUT VIC AB 3-0 SH 27 (SUTURE) ×4
SUT VIC AB 3-0 SH 27X BRD (SUTURE) IMPLANT
SUT VICRYL 3-0 CR8 SH (SUTURE) IMPLANT
SYR CONTROL 10ML LL (SYRINGE) ×5 IMPLANT
TOWEL OR 17X24 6PK STRL BLUE (TOWEL DISPOSABLE) ×3 IMPLANT
TOWEL OR NON WOVEN STRL DISP B (DISPOSABLE) ×2 IMPLANT
TUBE CONNECTING 20X1/4 (TUBING) ×2 IMPLANT
WATER STERILE IRR 1000ML POUR (IV SOLUTION) ×2 IMPLANT
YANKAUER SUCT BULB TIP NO VENT (SUCTIONS) ×3 IMPLANT

## 2012-03-05 NOTE — Op Note (Signed)
Preoperative Diagnosis: left breast cancer  Postoprative Diagnosis: left breast cancer  Procedure: Procedure(s): BREAST LUMPECTOMY WITH AXILLARY SENTINAL LYMPH NODE BIOPSY, BLUE DYE INJECTION   Surgeon: Glenna Fellows T   Assistants: None  Anesthesia:  General LMA anesthesiaDiagnos  Indications:   Patient is a 66 year old female who who recently discovered a lump in the lateral aspect of her left breast. Subsequent diagnostic mammogram, ultrasound and MRI is significant for an approximately 2 cm mass in the lateral aspect of the left breast and the chest wall. Large core biopsy has shown a low-grade, ER/PR positive infiltrating ductal carcinoma. After extensive discussions of treatment options detailed elsewhere we are proceeding with left breast lumpectomy with sentinel lymph node biopsy.  Procedure Detail:  Patient is brought to the operating room, placed in the supine position on the operating table, and laryngeal mask anesthesia induced. Preoperatively she had undergone injection of 1 mCi of technetium sulfur colloid intradermally around the left nipple. The left breast chest axilla and arm were widely sterilely prepped and draped. She received preoperative IV antibiotics. PAS were in place. Patient timeout was performed and correct procedure verified. Sterile technique 10 cc of dilute methylene blue was injected subcutaneously beneath the left nipple and massaged for several minutes. Attention was then turned to the lumpectomy. The mass was palpable at the 3:00 position left breast. A curvilinear incision was made over the mass and dissection carried down into the subcutaneous tissue to the breast capsule. The fibrous capsule was approached the dissection was widened in all directions taking gross margins around the palpable area down toward the chest wall. The mass was near the chest wall but clearly mobile and as I came across the base of the mass the pectoralis fascia was taken with  the specimen. The specimen was oriented with ink. I did obtain a specimen mammogram which showed the clip in the spiculated lesion within the center of the specimen. This was sent for permanent section. The wound was irrigated and complete hemostasis obtained with cautery. I mobilized the breast somewhat off of the chest were medially toward the nipple and using interrupted 3-0 Vicryl the breast tissue was reapproximated bilaterally over the chest wall to at least partially obliterate the surgical defect. The subcutaneous was closed over this with interrupted 3-0 Vicryl. Attention was then turned to the axilla. The neoprobe was used to localize a hot area and a small transverse incision made. Dissection was carried down through the subcutaneous tissue and the clavipectoral fascia with cautery. Using blunt dissection I was able to identify a blue lymphatic and traced this into the axilla where there was a bright blue lymph node with very high counts. This was completely excised with cautery and ex vivo had counts in excess of 1600 background in the axilla less than 100. This was sent as a hot blue left axillary sentinel lymph node for permanent section. This wound was irrigated and hemostasis obtained. Both wounds were infiltrated with Marcaine with epinephrine. The deep tissue the axilla was closed with interrupted Vicryl. The subcutaneous was closed with interrupted Vicryl. Both skin incisions were closed with subcuticular 5-0 Monocryl and Dermabond. Sponge needle instrument counts were correct.    Estimated Blood Loss:  Minimal         Drains: none  Blood Given: none          Specimens: #1 left breast lumpectomy #2 left axillary sentinel lymph node        Complications:  * No complications entered in  OR log *         Disposition: PACU - hemodynamically stable.         Condition: stable  Mariella Saa MD, FACS  03/05/2012, 1:37 PM

## 2012-03-05 NOTE — Discharge Instructions (Signed)
[facility] °Office Phone Number [phone] ° °BREAST BIOPSY/ PARTIAL MASTECTOMY: POST OP INSTRUCTIONS ° °Always review your discharge instruction sheet given to you by the facility where your surgery was performed. ° °IF YOU HAVE DISABILITY OR FAMILY LEAVE FORMS, YOU MUST BRING THEM TO THE OFFICE FOR PROCESSING.  DO NOT GIVE THEM TO YOUR DOCTOR. ° °1. A prescription for pain medication may be given to you upon discharge.  Take your pain medication as prescribed, if needed.  If narcotic pain medicine is not needed, then you may take acetaminophen (Tylenol) or ibuprofen (Advil) as needed. °2. Take your usually prescribed medications unless otherwise directed °3. If you need a refill on your pain medication, please contact your pharmacy.  They will contact our office to request authorization.  Prescriptions will not be filled after 5pm or on week-ends. °4. You should eat very light the first 24 hours after surgery, such as soup, crackers, pudding, etc.  Resume your normal diet the day after surgery. °5. Most patients will experience some swelling and bruising in the breast.  Ice packs and a good support bra will help.  Swelling and bruising can take several days to resolve.  °6. It is common to experience some constipation if taking pain medication after surgery.  Increasing fluid intake and taking a stool softener will usually help or prevent this problem from occurring.  A mild laxative (Milk of Magnesia or Miralax) should be taken according to package directions if there are no bowel movements after 48 hours. °7. Unless discharge instructions indicate otherwise, you may remove your bandages 24-48 hours after surgery, and you may shower at that time.  You may have steri-strips (small skin tapes) in place directly over the incision.  These strips should be left on the skin for 7-10 days.  If your surgeon used skin glue on the incision, you may shower in 24 hours.  The glue will flake off over the  next 2-3 weeks.  Any sutures or staples will be removed at the office during your follow-up visit. °8. ACTIVITIES:  You may resume regular daily activities (gradually increasing) beginning the next day.  Wearing a good support bra or sports bra minimizes pain and swelling.  You may have sexual intercourse when it is comfortable. °a. You may drive when you no longer are taking prescription pain medication, you can comfortably wear a seatbelt, and you can safely maneuver your car and apply brakes. °b. RETURN TO WORK:  ______________________________________________________________________________________ °9. You should see your doctor in the office for a follow-up appointment approximately two weeks after your surgery.  Your doctor’s nurse will typically make your follow-up appointment when she calls you with your pathology report.  Expect your pathology report 2-3 business days after your surgery.  You may call to check if you do not hear from us after three days. °10. OTHER INSTRUCTIONS: _______________________________________________________________________________________________ _____________________________________________________________________________________________________________________________________ °_____________________________________________________________________________________________________________________________________ °_____________________________________________________________________________________________________________________________________ ° °WHEN TO CALL YOUR DOCTOR: °1. Fever over 101.0 °2. Nausea and/or vomiting. °3. Extreme swelling or bruising. °4. Continued bleeding from incision. °5. Increased pain, redness, or drainage from the incision. ° °The clinic staff is available to answer your questions during regular business hours.  Please don’t hesitate to call and ask to speak to one of the nurses for clinical concerns.  If you have a medical emergency, go to the nearest  emergency room or call 911.  A surgeon from [facility] is always on call at the hospital. ° °For further questions, please visit [email or web address]  ° ° °  Post Anesthesia Home Care Instructions ° °Activity: °Get plenty of rest for the remainder of the day. A responsible adult should stay with you for 24 hours following the procedure.  °For the next 24 hours, DO NOT: °-Drive a car °-Operate machinery °-Drink alcoholic beverages °-Take any medication unless instructed by your physician °-Make any legal decisions or sign important papers. ° °Meals: °Start with liquid foods such as gelatin or soup. Progress to regular foods as tolerated. Avoid greasy, spicy, heavy foods. If nausea and/or vomiting occur, drink only clear liquids until the nausea and/or vomiting subsides. Call your physician if vomiting continues. ° °Special Instructions/Symptoms: °Your throat may feel dry or sore from the anesthesia or the breathing tube placed in your throat during surgery. If this causes discomfort, gargle with warm salt water. The discomfort should disappear within 24 hours. ° °

## 2012-03-05 NOTE — Anesthesia Preprocedure Evaluation (Addendum)
Anesthesia Evaluation  Patient identified by MRN, date of birth, ID band Patient awake    Reviewed: Allergy & Precautions, H&P , NPO status , Patient's Chart, lab work & pertinent test results, reviewed documented beta blocker date and time   Airway Mallampati: II TM Distance: >3 FB Neck ROM: full    Dental   Pulmonary neg pulmonary ROS,          Cardiovascular hypertension, On Medications     Neuro/Psych negative neurological ROS  negative psych ROS   GI/Hepatic negative GI ROS, Neg liver ROS,   Endo/Other  Diabetes mellitus-, Insulin Dependent  Renal/GU negative Renal ROS  negative genitourinary   Musculoskeletal   Abdominal   Peds  Hematology negative hematology ROS (+)   Anesthesia Other Findings See surgeon's H&P   Reproductive/Obstetrics negative OB ROS                           Anesthesia Physical Anesthesia Plan  ASA: III  Anesthesia Plan: General   Post-op Pain Management:    Induction: Intravenous  Airway Management Planned: LMA  Additional Equipment:   Intra-op Plan:   Post-operative Plan: Extubation in OR  Informed Consent: I have reviewed the patients History and Physical, chart, labs and discussed the procedure including the risks, benefits and alternatives for the proposed anesthesia with the patient or authorized representative who has indicated his/her understanding and acceptance.   Dental Advisory Given  Plan Discussed with: CRNA and Surgeon  Anesthesia Plan Comments:         Anesthesia Quick Evaluation

## 2012-03-05 NOTE — Anesthesia Procedure Notes (Signed)
Procedure Name: LMA Insertion Date/Time: 03/05/2012 12:17 PM Performed by: Meyer Russel Pre-anesthesia Checklist: Patient identified, Emergency Drugs available, Suction available and Patient being monitored Patient Re-evaluated:Patient Re-evaluated prior to inductionOxygen Delivery Method: Circle System Utilized Preoxygenation: Pre-oxygenation with 100% oxygen Intubation Type: IV induction Ventilation: Mask ventilation without difficulty LMA: LMA inserted LMA Size: 4.0 Number of attempts: 1 Airway Equipment and Method: bite block Placement Confirmation: positive ETCO2 and breath sounds checked- equal and bilateral Tube secured with: Tape Dental Injury: Teeth and Oropharynx as per pre-operative assessment

## 2012-03-05 NOTE — Transfer of Care (Signed)
Immediate Anesthesia Transfer of Care Note  Patient: Gina [name]  Procedure(s) Performed: Procedure(s) (LRB): BREAST LUMPECTOMY WITH AXILLARY LYMPH NODE BIOPSY (Left)  Patient Location: PACU  Anesthesia Type: General  Level of Consciousness: awake and oriented  Airway & Oxygen Therapy: Patient Spontanous Breathing and Patient connected to face mask oxygen  Post-op Assessment: Report given to PACU RN, Post -op Vital signs reviewed and stable and Patient moving all extremities  Post vital signs: Reviewed and stable  Complications: No apparent anesthesia complications

## 2012-03-05 NOTE — H&P (Signed)
HPI  Patient is a pleasant 66 year old female seen today in the breast multidisciplinary clinic for a new diagnosis of invasive left breast cancer, referred by Dr. Margy Clarks at [facility] the patient states that she was able to initially feel a lump in her left breast at least several months ago. It has not changed since she noticed it. She recently saw Dr. Tanya Nones and was referred for further imaging and diagnosis. She has not noted any skin changes or nipple discharge or pain or other worrisome symptoms. Diagnostic mammogram was performed which revealed a 1.8 cm spiculated mass in the upper outer quadrant of the left breast. An 8 mm mass was also noted in the right retroareolar area. Ultrasound on the left showed an ill-defined 1.5 cm hypoechoic mass. Ultrasound-guided core biopsy was recommended and performed on both sides. The right revealed a benign fibroadenoma. The left revealed invasive ductal carcinoma, grade 1 ER PR positive. Subsequent breast MRI has been performed which has revealed a mass posteriorly at the 3:00 position in the left breast with greatest diameter of 2.1 cm. There was no extension to the chest wall or any abnormal lymph nodes seen. The patient is now here for further treatment planning.  Past Medical History   Diagnosis  Date   .  Ovarian cyst    .  Diabetes mellitus      Insulin pump   .  Hypertension     Past Surgical History   Procedure  Date   .  Tonsillectomy     Review of Systems  Constitutional: Negative.  HENT: Positive for hearing loss and tinnitus.  Eyes: Negative.  Respiratory: Negative.  Cardiovascular: Negative.  Gastrointestinal: Negative.  Colonoscopy up to date  Genitourinary: Negative.  Musculoskeletal: Negative.  Psychiatric/Behavioral: Negative.    Objective:   Physical Exam  General: Alert, well-developed Caucasian female, in no distress  Skin: Warm and dry without rash or infection.  HEENT: No palpable masses or thyromegaly. Sclera  nonicteric. Pupils equal round and reactive. Oropharynx clear.  Lymph nodes: No cervical, supraclavicular, or inguinal nodes palpable.  Breasts: In the lateral aspect of the left breast posteriorly at the 3:00 position is an approximately 1-1/2 cm palpable freely movable mass. Slight bruising from the biopsy. No other palpable masses or skin changes  Lungs: Breath sounds clear and equal without increased work of breathing  Cardiovascular: Regular rate and rhythm without murmur. No JVD or edema. Peripheral pulses intact.  Abdomen: Nondistended. Soft and nontender. No masses palpable. No organomegaly. No palpable hernias.  Extremities: No edema or joint swelling or deformity. No chronic venous stasis changes.  Neurologic: Alert and fully oriented. Gait normal.  Assessment:    New diagnosis of approximately 2 cm invasive ductal carcinoma of the left breast. We discussed initial surgical treatment options in detail today. I believe she would be a good candidate for breast conservation with lumpectomy and sentinel lymph node biopsy. We discussed this procedure in detail including its indications in nature and recovery as well as risks of bleeding, infection, and possible need for further surgery based on pathology findings. We discussed risks of anesthesia. We discussed total mastectomy as an option and discussed the advantages and disadvantages of each treatment course. We discussed that reconstruction is an option should she choose mastectomy. We discussed in general terms and treatment radiation and hormonal treatment. All her questions were answered. She has now elected to proceed with breast conservation therapy   PLAN: Left breast lumpectomy and sentinal lymph node  biopsy

## 2012-03-05 NOTE — Progress Notes (Signed)
On admission, patient reports taking 3 Glucose tablets prior to arrival, and placing insulin pump on hold.  Blood glucose checked, 96 at 1035. Dr Gelene Mink updated and patient instructed by Dr Gelene Mink to keep insulin pump off in pre op

## 2012-03-06 LAB — GLUCOSE, CAPILLARY: Glucose-Capillary: 217 mg/dL — ABNORMAL HIGH (ref 70–99)

## 2012-03-06 NOTE — Anesthesia Postprocedure Evaluation (Signed)
Anesthesia Post Note  Patient: Gina [name]  Procedure(s) Performed: Procedure(s) (LRB): BREAST LUMPECTOMY WITH AXILLARY LYMPH NODE BIOPSY (Left)  Anesthesia type: General  Patient location: PACU  Post pain: Pain level controlled  Post assessment: Patient's Cardiovascular Status Stable  Last Vitals:  Filed Vitals:   03/05/12 1524  BP: 156/77  Pulse: 64  Temp: 36.9 C  Resp: 18    Post vital signs: Reviewed and stable  Level of consciousness: alert  Complications: No apparent anesthesia complications

## 2012-03-07 ENCOUNTER — Ambulatory Visit: Payer: 59 | Admitting: Oncology

## 2012-03-11 ENCOUNTER — Encounter: Payer: Self-pay | Admitting: *Deleted

## 2012-03-11 ENCOUNTER — Telehealth (INDEPENDENT_AMBULATORY_CARE_PROVIDER_SITE_OTHER): Payer: Self-pay | Admitting: General Surgery

## 2012-03-11 NOTE — Telephone Encounter (Signed)
Discussed path report with pt

## 2012-03-11 NOTE — Progress Notes (Signed)
Ordered Oncotype Dx test w/ [facility].  Faxed request to [facility].  Faxed PAC to [employer]. 

## 2012-03-13 ENCOUNTER — Telehealth (INDEPENDENT_AMBULATORY_CARE_PROVIDER_SITE_OTHER): Payer: Self-pay

## 2012-03-13 NOTE — Telephone Encounter (Signed)
Follow up appointment given to patient for 03/14/12 @ 10:15 as work in w/Dr. Johna Sheriff.

## 2012-03-14 ENCOUNTER — Telehealth: Payer: Self-pay | Admitting: *Deleted

## 2012-03-14 ENCOUNTER — Ambulatory Visit (INDEPENDENT_AMBULATORY_CARE_PROVIDER_SITE_OTHER): Payer: Managed Care, Other (non HMO) | Admitting: General Surgery

## 2012-03-14 ENCOUNTER — Encounter (INDEPENDENT_AMBULATORY_CARE_PROVIDER_SITE_OTHER): Payer: Self-pay | Admitting: General Surgery

## 2012-03-14 VITALS — BP 125/80 | HR 63 | Temp 98.6°F | Ht 64.0 in | Wt 150.0 lb

## 2012-03-14 DIAGNOSIS — C50419 Malignant neoplasm of upper-outer quadrant of unspecified female breast: Secondary | ICD-10-CM

## 2012-03-14 NOTE — Telephone Encounter (Signed)
Pt called states " I dont know why I need the Oncotype DX test that was ordered. " Discussed with pt the test is performed to see if pt would benefit from Chemo and what recurrence score is if no chemo Pt was upset that this test is $4700.00. She received a call from insurance and did approve this test to be done.  After thinking more about the test pt is asking to hold off on the test until she see's Dr. Welton Flakes on 6/11 to discuss it further. Pt is aware the test results may be back by her office visit as she did consent to testing with insurance company. Offered to have Dawn, [employer] speak with pt to further discuss information concerning her treatment plan and test information. Pt declined. Pt states " I will just talk with Dr. Welton Flakes when I see her on 6/11." Reviewed with pt her concerns and I will forward them to Dr. Welton Flakes. Pt verbalized understanding.

## 2012-03-14 NOTE — Progress Notes (Signed)
Chief complaint: Followup lumpectomy and sentinel lymph node biopsy  History: Patient returns possibly 10 days following left breast lumpectomy and left axillary sentinel lymph node biopsy. She reports some expected soreness but is generally doing well.  Exam: Both incisions are healing very nicely without swelling or bruising or any evidence of infection.  Review of her pathology which were previously discussed by phone. Her sentinel lymph node was positive. Her tumor is 2 cm and margins are negative. The posterior margin was within 1 mm but this was against chest wall and I did take the fascia and this margin is clear.  The patient had a lot of questions regarding chemotherapy and hormonal therapy that I answered to the best of my ability. We discussed management of her axilla with positive sentinel lymph node. If she were to receive chemotherapy therapy and radiation certainly no lymph node dissection would be required. If she receives hormonal therapy and radiation there would be some controversy here which I would need to discuss with Dr. Park Breed. She has an appointment with medical oncology within the next 2 weeks. I will see her back in 3 months or sooner as necessary.

## 2012-03-18 ENCOUNTER — Encounter: Payer: Self-pay | Admitting: *Deleted

## 2012-03-18 NOTE — Progress Notes (Signed)
Received Oncotype Dx results of 5.  Gave copy to MD.  Took copy to Med Rec to scan. 

## 2012-03-20 ENCOUNTER — Encounter (INDEPENDENT_AMBULATORY_CARE_PROVIDER_SITE_OTHER): Payer: Managed Care, Other (non HMO) | Admitting: General Surgery

## 2012-03-20 ENCOUNTER — Telehealth: Payer: Self-pay | Admitting: *Deleted

## 2012-03-20 NOTE — Telephone Encounter (Signed)
Pt called to c/o cost of oncotype dx testing and why she wasn't informed testing would be performed.  Referred pt to care plan summary from [facility] clinic on 01/30/12 which states in the recommendations we would send out testing for recurrence score.  I also referred her to the written material we sent home with her about the testing.  Explained the importance of testing and that we would be able to cancel if she desired.  Pt request percentage of recurrence with and without chemo and with and with radiation.  I have given these requests to Dr. Welton Flakes and Dr. Basilio Cairo.  Confirmed f/u appt with Dr. Welton Flakes on 6/11.  Encourage pt to call with further concerns.  I called and put her oncotype dx testing on hold until further notice from the pt.

## 2012-03-20 NOTE — Telephone Encounter (Signed)
Thank you :)

## 2012-03-21 ENCOUNTER — Telehealth: Payer: Self-pay | Admitting: *Deleted

## 2012-03-21 NOTE — Telephone Encounter (Signed)
Pt call to c/o of not understanding billing and why she was charged for certain visits etc.  Informed pt that I could give her information to the billing department for them to discuss exactly what was billed to her.  Informed pt I didn't work with billing and wasn't very knowledgeable in the area.  Pt relayed she understood and gave me permission to reach billing and have them call her to discuss her concerns.  I called and gave billing department her information for them to reach out as well as [facility] billing department to contact her about her concerns.

## 2012-03-25 ENCOUNTER — Encounter: Payer: Self-pay | Admitting: Oncology

## 2012-03-25 ENCOUNTER — Encounter: Payer: Self-pay | Admitting: Radiation Oncology

## 2012-03-25 ENCOUNTER — Ambulatory Visit (HOSPITAL_BASED_OUTPATIENT_CLINIC_OR_DEPARTMENT_OTHER): Payer: Managed Care, Other (non HMO) | Admitting: Oncology

## 2012-03-25 ENCOUNTER — Telehealth: Payer: Self-pay | Admitting: *Deleted

## 2012-03-25 VITALS — BP 135/75 | HR 67 | Temp 98.1°F | Ht 64.0 in | Wt 150.9 lb

## 2012-03-25 DIAGNOSIS — C773 Secondary and unspecified malignant neoplasm of axilla and upper limb lymph nodes: Secondary | ICD-10-CM

## 2012-03-25 DIAGNOSIS — C50419 Malignant neoplasm of upper-outer quadrant of unspecified female breast: Secondary | ICD-10-CM

## 2012-03-25 DIAGNOSIS — Z170 Estrogen receptor positive status [ER+]: Secondary | ICD-10-CM

## 2012-03-25 NOTE — Progress Notes (Signed)
OFFICE PROGRESS NOTE  CC  [name],[name], MD, MD [address] [place] [address] Dr. Felizardo [address]mann Dr. 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 Patient is status post lumpectomy with sentinel node biopsy performed on 03/05/2012  PRIOR THERAPY:  #1 patient was originally seen at the multidisciplinary breast clinic. At that time she was recommended a lumpectomy with sentinel node biopsy. Based on her final pathology we recommended sending a Oncotype testing on her as well.  #2 patient has gone on to have a lumpectomy with sentinel node biopsy on 03/05/2012. Her final pathology revealed a 2.0 cm invasive ductal carcinoma. ER positive PR positive HER-2/neu negative grade 1. One sentinel node was positive for macro metastatic disease with no extracapsular extension. There was a close posterior margin at the fascia.  #3 patient had Oncotype DX testing performed on her final specimen and her recurrence score was 5 with an average rate of distant recurrence of only 5%. This patient is now being recommended adjuvant chemotherapy.  #4 patient will begin radiation therapy postoperatively and she will be seen by Dr. Doristine Devoid on 03/26/2012.once patient completes radiation therapy then we will plan on giving her antiestrogen therapy and I discussed aromatase inhibitors with her as well as tamoxifen.  CURRENT THERAPY:patient to begin adjuvant radiation therapy to the left breast.  INTERVAL HISTORY: Gina [name] 66 y.o. female returns for followup visit post lumpectomy. Patient has been in constant contact with our breast navigator Serafina Royals regarding a lot of questions which.has very nicely and kindly counselor for her. Postoperatively patient seems to be doing well. She clinically  feels well she has no fevers chills night sweats she does experiencing some shooting pains in the left breast. She is denying any nausea or vomiting no myalgias or arthralgias. Remainder of the 10 point review of systems is negative.  MEDICAL HISTORY: Past Medical History  Diagnosis Date  . Ovarian cyst   . Hypertension   . Breast cancer   . Diabetes mellitus     Insulin pump  . Insulin pump in place     ALLERGIES:   has no known allergies.  MEDICATIONS:  Current Outpatient Prescriptions  Medication Sig Dispense Refill  . alendronate (FOSAMAX) 70 MG tablet Take 70 mg by mouth every 7 (seven) days. Take with a full glass of water on an empty stomach.      Marland Kitchen aspirin 81 MG tablet Take 81 mg by mouth daily.      . calcium carbonate (TUMS - DOSED IN MG ELEMENTAL CALCIUM) 500 MG chewable tablet Chew 1 tablet by mouth daily.      . cholecalciferol (VITAMIN D) 1000 UNITS tablet Take 2,000 Units by mouth daily.      Marland Kitchen co-enzyme Q-10 50 MG capsule Take 400 mg by mouth daily.      Marland Kitchen glucosamine-chondroitin 500-400 MG tablet Take 1 tablet by mouth 3 (three) times daily.      Marland Kitchen glucose 4 GM chewable tablet Chew 16 g by mouth as needed.      . insulin lispro (HUMALOG) 100 UNIT/ML injection Inject into the skin. 23.25 units basal 1:C ratio 1U to 10 Carbs      . Methylsulfonylmethane (MSM PO) Take 2,000 each by mouth daily.      Marland Kitchen  niacin (NIASPAN) 500 MG CR tablet Take 500 mg by mouth at bedtime.      . NON FORMULARY Take 600 mg by mouth daily. Tuneric Extract      . olmesartan (BENICAR) 20 MG tablet Take 10 mg by mouth daily.      Marland Kitchen UNABLE TO FIND Take 40 mg by mouth daily. Med Name: Phosphorus      . vitamin A 16109 UNIT capsule Take 10,000 Units by mouth daily.        SURGICAL HISTORY:  Past Surgical History  Procedure Date  . Tonsillectomy   . Ovarian cyst surgery   . Appendectomy   . Colonoscopy   . Breast surgery 03/05/2012    Lt br lumpectomy    REVIEW OF SYSTEMS:  Pertinent items  are noted in HPI.   PHYSICAL EXAMINATION: General appearance: alert, cooperative and appears stated age Resp: clear to auscultation bilaterally and normal percussion bilaterally Back: symmetric, no curvature. ROM normal. No CVA tenderness. Cardio: regular rate and rhythm, S1, S2 normal, no murmur, click, rub or gallop GI: soft, non-tender; bowel sounds normal; no masses,  no organomegaly Extremities: extremities normal, atraumatic, no cyanosis or edema Neurologic: Grossly normal Breast examination left breast reveals a healed incisional scar in the upper outer quadrant as well as the sentinel node incisional scar. There is no redness erythema no nipple discharge no skin changes otherwise. Right breast no masses or nipple discharge.  ECOG PERFORMANCE STATUS: 0 - Asymptomatic  Blood pressure 135/75, pulse 67, temperature 98.1 F (36.7 C), temperature source Oral, height 5\' 4"  (1.626 m), weight 150 lb 14.4 oz (68.448 kg).  LABORATORY DATA: Lab Results  Component Value Date   WBC 3.1* 01/30/2012   HGB 13.5 03/05/2012   HCT 39.5 01/30/2012   MCV 90.4 01/30/2012   PLT 172 01/30/2012      Chemistry      Component Value Date/Time   NA 132* 02/29/2012 1350   K 4.9 02/29/2012 1350   CL 93* 02/29/2012 1350   CO2 30 02/29/2012 1350   BUN 8 02/29/2012 1350   CREATININE 0.64 02/29/2012 1350      Component Value Date/Time   CALCIUM 9.3 02/29/2012 1350   ALKPHOS 85 01/30/2012 0814   AST 30 01/30/2012 0814   ALT 29 01/30/2012 0814   BILITOT 0.5 01/30/2012 0814     Visit #: [id number] REPORT OF SURGICAL PATHOLOGY ADDITIONAL INFORMATION: 1. A sample (Block 1G) was sent to [facility] for Oncotype testing. The patient's recurrence score is 5. Those patients who had a recurrence score of 5 had an average rate of distant recurrence of 5%. (JK:mw 03-18-12) Pecola Leisure MD Pathologist, Electronic Signature ( Signed 03/18/2012) 1. CHROMOGENIC IN-SITU HYBRIDIZATION Interpretation HER-2/NEU BY CISH - NO  AMPLIFICATION OF HER-2 DETECTED. THE RATIO OF HER-2: CEP 17 SIGNALS WAS 1.00. Reference range: Ratio: HER2:CEP17 < 1.8 - gene amplification not observed Ratio: HER2:CEP 17 1.8-2.2 - equivocal result Ratio: HER2:CEP17 > 2.2 - gene amplification observed Pecola Leisure MD Pathologist, Electronic Signature ( Signed 03/12/2012) FINAL DIAGNOSIS Diagnosis 1. Breast, lumpectomy, Left breast - INVASIVE DUCTAL CARCINOMA, 2 CM. - DUCTAL CARCINOMA IN SITU WITH CALCIFICATIONS. 1 of 3 FINAL for [facility], [place] [phone]) Diagnosis(continued) - INVASIVE CARCINOMA LESS THAN 0.1 CM FROM POSTERIOR MARGIN. - FOCAL LYMPHATIC VASCULAR INVOLVEMENT BY TUMOR. 2. Lymph node, sentinel, biopsy, Left axilla - METASTATIC CARCINOMA IN ONE LYMPH NODE (1/1). Microscopic Comment 1. BREAST, INVASIVE TUMOR, WITH LYMPH NODE SAMPLING Specimen, including laterality: Left breast Procedure:  Lumpectomy and sentinel lymph node Grade: I Tubule formation: 2 Nuclear pleomorphism: 2 Mitotic: 1 Tumor size (gross measurement): 2 cm Margins: Free of tumor Invasive, distance to closest margin: Less than 0.1 cm from posterior margin In-situ, distance to closest margin: 0.3 cm from posterior margin If margin positive, focally or broadly: N/A Lymphovascular invasion: Present Ductal carcinoma in situ: Present Grade: Intermediate Extensive intraductal component: No Lobular neoplasia: No Tumor focality: Unifocal Treatment effect: No If present, treatment effect in breast tissue, lymph nodes or both: N/A Extent of tumor: Skin: N/A Nipple: N/A Skeletal muscle: N/A Lymph nodes: # examined: 1 Lymph nodes with metastasis: 1 Isolated tumor cells (< 0.2 mm): 0 Micrometastasis: (> 0.2 mm and < 2.0 mm): 0 Macrometastasis: (> 2.0 mm): 1 Extracapsular extension: No Breast prognostic profile: Case [id number] Estrogen receptor: 99%, positive Progesterone receptor: 100%, positive Her 2 neu: No amplification, ratio 1.19. Will be  repeated on current specimen. Ki-67: 12% Non-neoplastic breast: Fibrocystic changes. TNM: pT1c, pN1a, pMX (JDP:kh 03-07-12) Jimmy Picket MD Pathologist, Electronic Signature (Case signed 03/07/2012) Specimen Gross and Clinical Information 2 of  RADIOGRAPHIC STUDIES:  Nm Sentinel Node Inj-no Rpt (breast)  03/05/2012  CLINICAL DATA: Cancer breast   Sulfur colloid was injected intradermally by the nuclear medicine  technologist for breast cancer sentinel node localization.      ASSESSMENT: 66 year old female with  #1 stage II (T1 C. N1 a) low-grade invasive ductal carcinoma of the left breast she is now status post lumpectomy with the final pathology revealing a 2.0 cm invasive ductal carcinoma with a posterior margin that is less than 0.1 cm. One sentinel node was positive for metastatic disease without any extracapsular extension.  #2 patient had Oncotype DX testing performed with a recurrence score of only 5 giving her a distant recurrence of only 9% with 5 years of tamoxifen. Patient and I discussed this at great lengths today. We discussed the rationale for obtaining this test.  #3 patient had other numerous questions which I spent a great deal of time these tests did involve some billing issues as well as others logistics including getting an appointment with Dr. Basilio Cairo. A referral was made to her as well.   PLAN:   #1 patient will be referred back to Dr. Basilio Cairo for postop radiation therapy.  #2 once patient finishes the radiation therapy then she will go on to antiestrogen therapy with letrozole 2.5 mg daily. We discussed the risks and benefits. They also mentioned other antiestrogen treatments as well including Arimidex Aromasin. We also discussed tamoxifen. Risks and benefits of the treatments were discussed with the family and the patient.  #3 I spent approximately one hour with the patient answered all of her questions and counseling her regarding her diagnosis treatment options  and her final pathology results as well as the Oncotype DX testing results. Do think that at the end of our discussion patient understood quite a bit regarding her disease process.  #4 patient up to date has declined genetic testing certainly we could discuss this again at a later date.   All questions were answered. The patient knows to call the clinic with any problems, questions or concerns. We can certainly see the patient much sooner if necessary.  I spent 55 minutes counseling the patient face to face. The total time spent in the appointment was 30 minutes.    Drue Second, MD Medical/Oncology [facility] [phone] (beeper) [phone] (Office)  03/25/2012, 10:25 AM

## 2012-03-25 NOTE — Patient Instructions (Signed)
1. Proceed with radiation first  2. i will see you back in 2 months  3. Please call with any questions or problems

## 2012-03-25 NOTE — Telephone Encounter (Signed)
made patient appointment for 03-26-2012 with dr.squire on 03-26-2012 starting at 3:00pm gave patient appointment for 06-04-2012 at 9:00am printed out calendar and gave to the patient

## 2012-03-26 ENCOUNTER — Ambulatory Visit
Admission: RE | Admit: 2012-03-26 | Discharge: 2012-03-26 | Disposition: A | Discharge status: Home / Self Care | Payer: Managed Care, Other (non HMO) | Source: Ambulatory Visit | Attending: Radiation Oncology | Admitting: Radiation Oncology

## 2012-03-26 ENCOUNTER — Encounter: Payer: Self-pay | Admitting: Radiation Oncology

## 2012-03-26 ENCOUNTER — Encounter: Payer: Self-pay | Admitting: *Deleted

## 2012-03-26 VITALS — BP 146/77 | HR 65 | Temp 97.5°F | Resp 20 | Wt 151.3 lb

## 2012-03-26 DIAGNOSIS — Z794 Long term (current) use of insulin: Secondary | ICD-10-CM | POA: Insufficient documentation

## 2012-03-26 DIAGNOSIS — C50419 Malignant neoplasm of upper-outer quadrant of unspecified female breast: Secondary | ICD-10-CM

## 2012-03-26 DIAGNOSIS — E119 Type 2 diabetes mellitus without complications: Secondary | ICD-10-CM | POA: Insufficient documentation

## 2012-03-26 DIAGNOSIS — I10 Essential (primary) hypertension: Secondary | ICD-10-CM | POA: Insufficient documentation

## 2012-03-26 DIAGNOSIS — Z510 Encounter for antineoplastic radiation therapy: Secondary | ICD-10-CM | POA: Insufficient documentation

## 2012-03-26 DIAGNOSIS — F419 Anxiety disorder, unspecified: Secondary | ICD-10-CM | POA: Insufficient documentation

## 2012-03-26 DIAGNOSIS — D0590 Unspecified type of carcinoma in situ of unspecified breast: Secondary | ICD-10-CM | POA: Insufficient documentation

## 2012-03-26 DIAGNOSIS — E785 Hyperlipidemia, unspecified: Secondary | ICD-10-CM | POA: Insufficient documentation

## 2012-03-26 DIAGNOSIS — Z79899 Other long term (current) drug therapy: Secondary | ICD-10-CM | POA: Insufficient documentation

## 2012-03-26 HISTORY — DX: Hyperlipidemia, unspecified: E78.5

## 2012-03-26 HISTORY — DX: Anxiety disorder, unspecified: F41.9

## 2012-03-26 NOTE — Progress Notes (Signed)
Left breast cancer MTOCH  Consult Alert ,oriented x3, interested in nutrition consult asap,  Married, 3 children Allergies: NKDA

## 2012-03-26 NOTE — Progress Notes (Signed)
Radiation Oncology         ([phone]) [phone] ________________________________  Initial outpatient Consultation  Name: Gina [name] MRN: [id number]  Date: 03/26/2012  DOB: 09-Jul-1946  ZH:[id number],ONGEXB TOM, MD  Victorino December, MD   REFERRING PHYSICIAN: Victorino December, MD  DIAGNOSIS: Stage II Pathologic T1c N1a clinical M0 grade 1 ER/PR positive HER-2/neu negative invasive ductal carcinoma of the left breast with ductal carcinoma in situ in the specimen.  HISTORY OF PRESENT ILLNESS::Gina [name] is a 66 y.o. female who returns for followup after her surgery. She underwent lumpectomy and sentinel lymph node biopsy on 03/05/2012. Pathology revealed a T1 C. lesion measuring 2.0 cm. She had a close posterior margin at the fascia but her surgeon is not concerned about this and feels that it is clear. The margin at that point was less than 0.1 cm due to fascia. There was focal lymphovascular space invasion. One out of one sentinel lymph nodes were positive. The sentinel lymph node contained a macro metastasis but no extracapsular extension.  She is healing well from surgery. She has shooting pains in her left breast. She has numerous questions today. I have also spoken to her on the phone since consultation with other questions.  She underwent Oncotype DX testing and has been told that she won't need chemotherapy but will eventually receive antiestrogen therapy. She has met with her genetics counselor but declined genetic testing.  PREVIOUS RADIATION THERAPY: No  PAST MEDICAL HISTORY:  has a past medical history of Ovarian cyst; Hypertension; Insulin pump in place; Breast cancer (03/05/12); Diabetes mellitus; Anxiety; and Hyperlipidemia.    PAST SURGICAL HISTORY: Past Surgical History  Procedure Date  . Tonsillectomy   . Ovarian cyst surgery   . Colonoscopy     1 polyp   . Breast surgery 03/05/2012    Left Breast Lumpectomy: Invasive Ductla Carcinoma: Ductal Carcinoma  Insitu with  Calcifications: 1/1 Node Positive for Mets.: ER/PR POs., Her 2 Neu negative, Ki-67 12%  . Appendectomy     FAMILY HISTORY: family history includes Breast cancer (age of onset:57) in her mother and Prostate cancer in her maternal uncle.  SOCIAL HISTORY:  reports that she has never smoked. She does not have any smokeless tobacco history on file. She reports that she does not drink alcohol or use illicit drugs.  ALLERGIES: Review of patient's allergies indicates no known allergies.  MEDICATIONS:  Reviewed in electronic medical record.  REVIEW OF SYSTEMS:  As above   PHYSICAL EXAM:  weight is 151 lb 4.8 oz (68.629 kg). Her oral temperature is 97.5 F (36.4 C). Her blood pressure is 146/77 and her pulse is 65. Her respiration is 20.   She is well appearing and in no acute distress. Her left breast demonstrates a lumpectomy scar that is healing well at the 3:00 position. Axillary scar is also healing well. There is no palpable seroma.   LABORATORY DATA:  Lab Results  Component Value Date   WBC 3.1* 01/30/2012   HGB 13.5 03/05/2012   HCT 39.5 01/30/2012   MCV 90.4 01/30/2012   PLT 172 01/30/2012   Lab Results  Component Value Date   NA 132* 02/29/2012   K 4.9 02/29/2012   CL 93* 02/29/2012   CO2 30 02/29/2012   Lab Results  Component Value Date   ALT 29 01/30/2012   AST 30 01/30/2012   ALKPHOS 85 01/30/2012   BILITOT 0.5 01/30/2012     RADIOGRAPHY: Nm Sentinel Node Inj-no Rpt (breast)  03/05/2012  CLINICAL DATA: Cancer breast   Sulfur colloid was injected intradermally by the nuclear medicine  technologist for breast cancer sentinel node localization.        IMPRESSION/PLAN:  Doing well postoperatively. The patient had numerous questions today which I answered in detail. She is very concerned about side effects from therapy. I explained the recommendations that have been agreed upon at our tumor board conference.  She will not need chemotherapy, but will eventually be a candidate for  antiestrogen therapy.  In terms of surgery, I do not think that further axillary lymph node dissection is necessary.  Based on the acosog z-0011 trial, I will treat the patient with high tangents to cover her axillary lymph nodes that are undissected. She prefers this approach to further surgery.  In terms of radiation, we spoke extensively about her future treatment course. I told her to anticipate about 6 weeks of radiotherapy. We spoke extensively about the risks benefits and side effects of radiotherapy. All questions were answered today. In detail, I explained how our  technology minimizes dose to normal tissues including the heart and lung.  Ultimately, the patient and her husband seemed satisfied with the plan. A consent form has been signed and placed in her chart. They have my contact information and have been encouraged to call if they have further questions. Otherwise I will schedule a simulation next week with anticipation of starting treatment during the last week of June.  Additionally, the patient is concerned about managing her diabetes and adhering to an ideal diet while seeding radiotherapy. She requested a nutrition consult and I will make that referral.   I spent 60 minutes minutes face to face with the patient and more than 50% of that time was spent in counseling and/or coordination of care.  -----------------------------------  Lonie Peak, MD

## 2012-03-26 NOTE — Progress Notes (Signed)
Please see the Nurse Progress Note in the MD Initial Consult Encounter for this patient. 

## 2012-03-27 ENCOUNTER — Telehealth: Payer: Self-pay | Admitting: *Deleted

## 2012-03-27 ENCOUNTER — Ambulatory Visit: Payer: Managed Care, Other (non HMO)

## 2012-03-27 NOTE — Telephone Encounter (Signed)
Error

## 2012-03-31 ENCOUNTER — Telehealth: Payer: Self-pay | Admitting: Nutrition

## 2012-03-31 ENCOUNTER — Telehealth: Payer: Self-pay | Admitting: Oncology

## 2012-03-31 NOTE — Telephone Encounter (Signed)
The patient called to follow up on her AM request.    I called Gina [name]- who reported that Dr. Johna [name] is out of the office until 04/10/12.    Gina [name] will talk to Dr. Jamse [name] partner for a note to keep her out of work until he can see her July 2 at 3:00 PM.   The patient stated that she felt better week number 2.   However, week number 3- she was having diarrhea- four-five times a day.   Pt started taking Imodium for the first day but then stopped.   We discussed dehydration as a possible side effect from the diarrhea.    I suggested that she resume imodium if she has diarrhea again, and to sip water the rest of the evening- and explained the importance of fluid intake.    I asked the patient to call me in the am with an update on her status.

## 2012-03-31 NOTE — Telephone Encounter (Signed)
Patient requested that I contact her by phone instead of setting up a nutrition consult.  I spoke with patient today but she was waiting for a call back from her MD.  She requested to call me back after she speaks to her MD.  I will await patient's phone call and assist her as needed.

## 2012-03-31 NOTE — Telephone Encounter (Signed)
The patient called stating that she is too tired to return to work.  She is post op from her breast surgery and is scheduled for a sim 04/02/12.   I emailed Dr. Johna Sheriff and Maryan Puls to see how they would like to proceed.

## 2012-04-01 ENCOUNTER — Telehealth (INDEPENDENT_AMBULATORY_CARE_PROVIDER_SITE_OTHER): Payer: Self-pay

## 2012-04-01 ENCOUNTER — Telehealth: Payer: Self-pay | Admitting: Oncology

## 2012-04-01 ENCOUNTER — Encounter (INDEPENDENT_AMBULATORY_CARE_PROVIDER_SITE_OTHER): Payer: Self-pay

## 2012-04-01 NOTE — Telephone Encounter (Signed)
The patient called this am stating that her diarrhea has improved, and she is drinking more water.  She is feeling better.   Gina [name] emailed a letter for medical leave to the patient's employer for her to stay home and recover until she sees Dr. Johna Sheriff 04/15/12.   Pt verbalized understanding.

## 2012-04-01 NOTE — Telephone Encounter (Signed)
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  Patient RTW note extended to 04/12/12, will review with Dr. Johna Sheriff on 04/10/2012.  Patient requested this to be emailed to her supervisor:  [email or web address]

## 2012-04-02 ENCOUNTER — Ambulatory Visit
Admission: RE | Admit: 2012-04-02 | Discharge: 2012-04-02 | Disposition: A | Discharge status: Home / Self Care | Payer: Managed Care, Other (non HMO) | Source: Ambulatory Visit | Attending: Radiation Oncology | Admitting: Radiation Oncology

## 2012-04-02 DIAGNOSIS — C50419 Malignant neoplasm of upper-outer quadrant of unspecified female breast: Secondary | ICD-10-CM

## 2012-04-02 NOTE — Progress Notes (Signed)
Met with patient and spouse, Jake Shark, to discuss RO billing.  Patient had no concerns today except stated she did not "have any trust in the [facility] billing system" and that she had already spoken to Trey Paula H previously and wanted to speak with him again.  Will email Trey Paula and ask him to contact the patient.

## 2012-04-02 NOTE — Progress Notes (Signed)
Simulation / 3-D conformal Treatment Planning Note  The patient has a diagnosis of  [id number] Left breast cancer; she is status post lumpectomy and sentinel lymph node biospy. She will receive whole breast radiotherapy. The patient was laid in the supine position on the treatment table with her arms over her head. Her head was in an Accuform device. I placed adhesive wiring over her lumpectomy scar and around the borders of her breast tissue. High-resolution CT axial imaging was obtained of the patient's chest. An isocenter was placed in her anterior left lung. Skin markings were made and she tolerated the procedure well without any complications.  The patient was then scanned in the CT simulator while holding her breath. I verified with our physicist that this increased the distance between her heart and chest wall. We will treat her with the breath-hold technique and base her planning on the breath-hold images.   Treatment planning note: the patient will be treated with opposed tangential fields using MLCs for custom blocks. High Tangents will be used to cover her axillary nodes and left breast together.  I plan to prescribe 50 Gray in 25 fractions to the whole breast. 3D conformal radiotherapy will be used to minimize the dose to her heart and lungs while delivering adequate dose to the breast /axilla and lumpectomy cavity. A DVH has been ordered of the heart and lungs and also the lumpectomy cavity.  After whole breast radiation, An electron boost will then follow to the lumpectomy cavity. This will be given with the patient is freely breathing. This will be to a additional dose of 10 Gray in 5 fractions.

## 2012-04-03 ENCOUNTER — Encounter: Payer: Managed Care, Other (non HMO) | Admitting: Nutrition

## 2012-04-03 ENCOUNTER — Encounter: Payer: Self-pay | Admitting: Radiation Oncology

## 2012-04-09 ENCOUNTER — Ambulatory Visit
Admission: RE | Admit: 2012-04-09 | Discharge: 2012-04-09 | Disposition: A | Discharge status: Home / Self Care | Payer: Managed Care, Other (non HMO) | Source: Ambulatory Visit | Attending: Radiation Oncology | Admitting: Radiation Oncology

## 2012-04-09 DIAGNOSIS — C50419 Malignant neoplasm of upper-outer quadrant of unspecified female breast: Secondary | ICD-10-CM

## 2012-04-09 NOTE — Progress Notes (Signed)
VERIFICATION SIMULATION NOTE  The patient was laid in the correct position on the treatment table for simulation verification. Portal imaging was obtained and I verified the fields and MLCs to be accurate for her left breast. The patient tolerated the procedure well.  -----------------------------------------------------  Lonie Peak, MD

## 2012-04-10 ENCOUNTER — Ambulatory Visit
Admission: RE | Admit: 2012-04-10 | Discharge: 2012-04-10 | Disposition: A | Discharge status: Home / Self Care | Payer: Managed Care, Other (non HMO) | Source: Ambulatory Visit | Attending: Radiation Oncology | Admitting: Radiation Oncology

## 2012-04-11 ENCOUNTER — Ambulatory Visit: Payer: Managed Care, Other (non HMO)

## 2012-04-14 ENCOUNTER — Ambulatory Visit
Admission: RE | Admit: 2012-04-14 | Discharge: 2012-04-14 | Disposition: A | Discharge status: Home / Self Care | Payer: Managed Care, Other (non HMO) | Source: Ambulatory Visit | Attending: Radiation Oncology | Admitting: Radiation Oncology

## 2012-04-14 ENCOUNTER — Encounter: Payer: Self-pay | Admitting: Radiation Oncology

## 2012-04-14 VITALS — BP 143/84 | HR 67 | Resp 18 | Wt 151.9 lb

## 2012-04-14 DIAGNOSIS — C50419 Malignant neoplasm of upper-outer quadrant of unspecified female breast: Secondary | ICD-10-CM

## 2012-04-14 NOTE — Progress Notes (Signed)
Patient presents to the clinic today accompanied by her husband for an under treat visit with Dr. Basilio Cairo. Patient is alert and oriented to person, place, and time. No distress noted. Steady gait noted. Pleasant affect noted. Patient denies pain at this time. However, patient does reports left breast discomfort. No skin changes noted to treated breast. Patient denies nausea, vomiting, headache, dizziness or diarrhea. Patient reports that today is her first day back at work. Reported all findings to Dr. Basilio Cairo.

## 2012-04-14 NOTE — Progress Notes (Signed)
   Weekly Management Note- pt seen before treatment today. pT1cN1aM0 Left breast cancer  Current Dose:   200 cGy  Projected Dose: 6000 cGy   Narrative:  The patient presents for routine under treatment assessment.  CBCT/MVCT images/Port film x-rays were reviewed.  The chart was checked. Doing well. Did not get treatment on Friday due to the machine being out of order. It is now restored.  Physical Findings:  weight is 151 lb 14.4 oz (68.901 kg). Her blood pressure is 143/84 and her pulse is 67. Her respiration is 18.  No distress.  Impression:  The patient is tolerating radiotherapy.  Plan:  Continue radiotherapy as planned.  ________________________________   Lonie Peak, M.D.

## 2012-04-15 ENCOUNTER — Ambulatory Visit
Admission: RE | Admit: 2012-04-15 | Discharge: 2012-04-15 | Disposition: A | Discharge status: Home / Self Care | Payer: Managed Care, Other (non HMO) | Source: Ambulatory Visit | Attending: Radiation Oncology | Admitting: Radiation Oncology

## 2012-04-15 ENCOUNTER — Encounter (INDEPENDENT_AMBULATORY_CARE_PROVIDER_SITE_OTHER): Payer: Managed Care, Other (non HMO) | Admitting: General Surgery

## 2012-04-15 DIAGNOSIS — C50419 Malignant neoplasm of upper-outer quadrant of unspecified female breast: Secondary | ICD-10-CM

## 2012-04-16 ENCOUNTER — Ambulatory Visit
Admission: RE | Admit: 2012-04-16 | Discharge: 2012-04-16 | Disposition: A | Discharge status: Home / Self Care | Payer: Managed Care, Other (non HMO) | Source: Ambulatory Visit | Attending: Radiation Oncology | Admitting: Radiation Oncology

## 2012-04-16 MED ORDER — ALRA NON-METALLIC DEODORANT (RAD-ONC)
1.0000 "application " | Freq: Once | TOPICAL | Status: AC
  Administered 2012-04-16: 1 via TOPICAL

## 2012-04-16 MED ORDER — RADIAPLEXRX EX GEL
Freq: Once | CUTANEOUS | Status: AC
  Administered 2012-04-16: 11:00:00 via TOPICAL

## 2012-04-16 NOTE — Progress Notes (Signed)
Received patient in the clinic tonight following treatment for post sim education with Sam, RN. Patient is unaccompanied. Patient is alert and oriented to person, place, and time. No distress noted. Steady gait noted. Pleasant affect noted. Patient denies pain at this time. Oriented patient to staff and routine of the clinic. Provided patient with Radiaplex and Alra then, reviewed use. Provided patient with RADIATION THERAPY AND YOU handbook then, reviewed pertinent information. Educated patient on potential side effects and management. All questions answered. Provided patient with this writer's business card and encouraged to call with needs. Patient verbalized understanding of all things reviewed.

## 2012-04-18 ENCOUNTER — Ambulatory Visit
Admission: RE | Admit: 2012-04-18 | Discharge: 2012-04-18 | Disposition: A | Discharge status: Home / Self Care | Payer: Managed Care, Other (non HMO) | Source: Ambulatory Visit | Attending: Radiation Oncology | Admitting: Radiation Oncology

## 2012-04-21 ENCOUNTER — Encounter: Payer: Self-pay | Admitting: Radiation Oncology

## 2012-04-21 ENCOUNTER — Ambulatory Visit
Admission: RE | Admit: 2012-04-21 | Discharge: 2012-04-21 | Disposition: A | Discharge status: Home / Self Care | Payer: Managed Care, Other (non HMO) | Source: Ambulatory Visit | Attending: Radiation Oncology | Admitting: Radiation Oncology

## 2012-04-21 VITALS — BP 135/84 | HR 59 | Resp 18 | Wt 152.5 lb

## 2012-04-21 DIAGNOSIS — C50419 Malignant neoplasm of upper-outer quadrant of unspecified female breast: Secondary | ICD-10-CM

## 2012-04-21 NOTE — Progress Notes (Signed)
Patient presents to the clinic today accompanied by her husband for an under treat visit with Dr. Basilio Cairo. Patient is alert and oriented to person, place, and time. No distress noted. Steady gait noted. Pleasant affect noted. Patient denies pain at this time. Patient reports an occasional brief sharp shooting pain in her left/treated breast but, understanding that is"part of healing." Patient reports hyperpigmentation without desquamation of left/treated breast. Patient reports using Radiaplex as directed. Patient reports eating and sleeping without difficulty. Reported all findings to Dr. Basilio Cairo.

## 2012-04-21 NOTE — Progress Notes (Addendum)
   Weekly Management Note T1 N1 left breast cancer Current Dose:   12 Gy  Projected Dose: 60 Gy   Narrative:  The patient presents for routine under treatment assessment.  CBCT/MVCT images/Port film x-rays were reviewed.  The chart was checked. Noticing some early skin irritation over the left breast. She is working full-time.  Physical Findings:  weight is 152 lb 8 oz (69.174 kg). Her blood pressure is 135/84 and her pulse is 59. Her respiration is 18.  early erythema over the left breast.  Impression:  The patient is tolerating radiotherapy.  Plan:  Continue radiotherapy as planned.  ________________________________   Lonie Peak, M.D.

## 2012-04-22 ENCOUNTER — Ambulatory Visit
Admission: RE | Admit: 2012-04-22 | Discharge: 2012-04-22 | Disposition: A | Discharge status: Home / Self Care | Payer: Managed Care, Other (non HMO) | Source: Ambulatory Visit | Attending: Radiation Oncology | Admitting: Radiation Oncology

## 2012-04-23 ENCOUNTER — Ambulatory Visit
Admission: RE | Admit: 2012-04-23 | Discharge: 2012-04-23 | Disposition: A | Discharge status: Home / Self Care | Payer: Managed Care, Other (non HMO) | Source: Ambulatory Visit | Attending: Radiation Oncology | Admitting: Radiation Oncology

## 2012-04-24 ENCOUNTER — Ambulatory Visit
Admission: RE | Admit: 2012-04-24 | Discharge: 2012-04-24 | Disposition: A | Discharge status: Home / Self Care | Payer: Managed Care, Other (non HMO) | Source: Ambulatory Visit | Attending: Radiation Oncology | Admitting: Radiation Oncology

## 2012-04-25 ENCOUNTER — Encounter (INDEPENDENT_AMBULATORY_CARE_PROVIDER_SITE_OTHER): Payer: Managed Care, Other (non HMO) | Admitting: General Surgery

## 2012-04-25 ENCOUNTER — Ambulatory Visit
Admission: RE | Admit: 2012-04-25 | Discharge: 2012-04-25 | Disposition: A | Discharge status: Home / Self Care | Payer: Managed Care, Other (non HMO) | Source: Ambulatory Visit | Attending: Radiation Oncology | Admitting: Radiation Oncology

## 2012-04-28 ENCOUNTER — Ambulatory Visit: Admission: RE | Admit: 2012-04-28 | Payer: Managed Care, Other (non HMO) | Source: Ambulatory Visit

## 2012-04-28 ENCOUNTER — Ambulatory Visit
Admission: RE | Admit: 2012-04-28 | Discharge: 2012-04-28 | Disposition: A | Discharge status: Home / Self Care | Payer: Managed Care, Other (non HMO) | Source: Ambulatory Visit | Attending: Radiation Oncology | Admitting: Radiation Oncology

## 2012-04-28 ENCOUNTER — Encounter: Payer: Self-pay | Admitting: Radiation Oncology

## 2012-04-28 VITALS — BP 134/74 | HR 62 | Temp 97.2°F | Wt 151.0 lb

## 2012-04-28 DIAGNOSIS — C50419 Malignant neoplasm of upper-outer quadrant of unspecified female breast: Secondary | ICD-10-CM

## 2012-04-28 NOTE — Progress Notes (Signed)
   Weekly Management Note Current Dose:   20 Gy  Projected Dose: 60  Gy   Narrative:  The patient presents for routine under treatment assessment.  CBCT/MVCT images/Port film x-rays were reviewed.  The chart was checked. She is doing well. Has some pruritis in the upper inner quadrant of the left breast.hydrocortisone cream is mildly effective  Physical Findings:  weight is 151 lb (68.493 kg). Her temperature is 97.2 F (36.2 C). Her blood pressure is 134/74 and her pulse is 62.  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 Early erythema throughout the rest of the left breast.  Impression:  The patient is tolerating radiotherapy.  Plan:  Continue radiotherapy as planned.we will transition to Biafine. Continue hydrocortisone cream. Will try hydrogel pads as well for the itching.. Machine broke down this afternoon so patient did not get treated. We'll hopefully resume tomorrow. ________________________________   Lonie Peak, M.D.

## 2012-04-29 ENCOUNTER — Ambulatory Visit
Admission: RE | Admit: 2012-04-29 | Discharge: 2012-04-29 | Disposition: A | Discharge status: Home / Self Care | Payer: Managed Care, Other (non HMO) | Source: Ambulatory Visit | Attending: Radiation Oncology | Admitting: Radiation Oncology

## 2012-04-30 ENCOUNTER — Ambulatory Visit
Admission: RE | Admit: 2012-04-30 | Discharge: 2012-04-30 | Disposition: A | Discharge status: Home / Self Care | Payer: Managed Care, Other (non HMO) | Source: Ambulatory Visit | Attending: Radiation Oncology | Admitting: Radiation Oncology

## 2012-05-01 ENCOUNTER — Ambulatory Visit
Admission: RE | Admit: 2012-05-01 | Discharge: 2012-05-01 | Disposition: A | Discharge status: Home / Self Care | Payer: Managed Care, Other (non HMO) | Source: Ambulatory Visit | Attending: Radiation Oncology | Admitting: Radiation Oncology

## 2012-05-02 ENCOUNTER — Ambulatory Visit
Admission: RE | Admit: 2012-05-02 | Discharge: 2012-05-02 | Disposition: A | Discharge status: Home / Self Care | Payer: Managed Care, Other (non HMO) | Source: Ambulatory Visit | Attending: Radiation Oncology | Admitting: Radiation Oncology

## 2012-05-05 ENCOUNTER — Ambulatory Visit
Admission: RE | Admit: 2012-05-05 | Discharge: 2012-05-05 | Disposition: A | Discharge status: Home / Self Care | Payer: Managed Care, Other (non HMO) | Source: Ambulatory Visit | Attending: Radiation Oncology | Admitting: Radiation Oncology

## 2012-05-05 VITALS — BP 137/79 | HR 57 | Temp 97.0°F | Wt 151.1 lb

## 2012-05-05 DIAGNOSIS — C50919 Malignant neoplasm of unspecified site of unspecified female breast: Secondary | ICD-10-CM

## 2012-05-05 NOTE — Progress Notes (Signed)
C/o tenderness left axilla. Itching Rt. upper innner left breast region which demonstrates a rash-like appearance with erythema.  Skin left breast remains intact.    Has been using Biafine for itching without results and states she tried 1% Hydrocortisone cream without any benefit.

## 2012-05-05 NOTE — Progress Notes (Signed)
Weekly Management Note:  Site:L Breast/axilla Current Dose:  3000  cGy Projected Dose: 5000  CGy plus 1000cGy boost  Narrative: The patient is seen today for routine under treatment assessment. CBCT/MVCT images/port films were reviewed. The chart was reviewed.   She is having more pruritus along her left breast which is not well-controlled with Biafine cream.  Physical Examination:  Filed Vitals:   05/05/12 1824  BP: 137/79  Pulse: 57  Temp: 97 F (36.1 C)  .  Weight: 151 lb 1.6 oz (68.539 kg). There is a papular radiation dermatitis along left breast, particularly the sun exposed region along the upper inner quadrant.  Impression: Tolerating radiation therapy well, although she is having significant pruritus. I told her that I would like for her to stop Biafine cream to start hydrocortisone cream when necessary. I do not feel that she is allergic to Biafine cream. She also make take Benadryl at night should she have nighttime pruritus.  Plan: Continue radiation therapy as planned.

## 2012-05-06 ENCOUNTER — Ambulatory Visit
Admission: RE | Admit: 2012-05-06 | Discharge: 2012-05-06 | Disposition: A | Discharge status: Home / Self Care | Payer: Managed Care, Other (non HMO) | Source: Ambulatory Visit | Attending: Radiation Oncology | Admitting: Radiation Oncology

## 2012-05-07 ENCOUNTER — Ambulatory Visit
Admission: RE | Admit: 2012-05-07 | Discharge: 2012-05-07 | Disposition: A | Discharge status: Home / Self Care | Payer: Managed Care, Other (non HMO) | Source: Ambulatory Visit | Attending: Radiation Oncology | Admitting: Radiation Oncology

## 2012-05-08 ENCOUNTER — Ambulatory Visit
Admission: RE | Admit: 2012-05-08 | Discharge: 2012-05-08 | Disposition: A | Discharge status: Home / Self Care | Payer: Managed Care, Other (non HMO) | Source: Ambulatory Visit | Attending: Radiation Oncology | Admitting: Radiation Oncology

## 2012-05-09 ENCOUNTER — Ambulatory Visit
Admission: RE | Admit: 2012-05-09 | Discharge: 2012-05-09 | Disposition: A | Discharge status: Home / Self Care | Payer: Managed Care, Other (non HMO) | Source: Ambulatory Visit | Attending: Radiation Oncology | Admitting: Radiation Oncology

## 2012-05-09 DIAGNOSIS — C50419 Malignant neoplasm of upper-outer quadrant of unspecified female breast: Secondary | ICD-10-CM

## 2012-05-09 MED ORDER — BIAFINE EX EMUL
CUTANEOUS | Status: DC | PRN
  Administered 2012-05-09: 18:00:00 via TOPICAL

## 2012-05-12 ENCOUNTER — Encounter: Payer: Self-pay | Admitting: Radiation Oncology

## 2012-05-12 ENCOUNTER — Ambulatory Visit
Admission: RE | Admit: 2012-05-12 | Discharge: 2012-05-12 | Disposition: A | Discharge status: Home / Self Care | Payer: Managed Care, Other (non HMO) | Source: Ambulatory Visit | Attending: Radiation Oncology | Admitting: Radiation Oncology

## 2012-05-12 VITALS — BP 157/78 | HR 63 | Resp 18 | Wt 152.2 lb

## 2012-05-12 DIAGNOSIS — C50419 Malignant neoplasm of upper-outer quadrant of unspecified female breast: Secondary | ICD-10-CM

## 2012-05-12 DIAGNOSIS — C50919 Malignant neoplasm of unspecified site of unspecified female breast: Secondary | ICD-10-CM

## 2012-05-12 MED ORDER — LIDOCAINE HCL 2 % EX GEL: Freq: Three times a day (TID) | CUTANEOUS | Status: DC

## 2012-05-12 NOTE — Progress Notes (Signed)
Received patient in the clinic today following treatment for PUT with Dr. Basilio Cairo. Patient is alert and oriented to person, place, and time. No distress noted. Steady gait noted. Pleasant affect noted. Patient denies pain at this time. Blood pressure elevated but, patient relates it to itching associated with radiation dermatitis. Hyperpigmentation with radiation dermatitis to left chest wall and center. Patient reports using Biafine, hydrocortisone, benadryl spray and oral benadryl but, is unable to obtain any relief. Patient reports the itching from the dermatitis is "about to dry her crazy and wearing me out." Patient reports she is unable to concentrate to perform her job because of the intense itching.Reported all finding to Dr. Basilio Cairo.

## 2012-05-12 NOTE — Progress Notes (Signed)
   Weekly Management Note Current Dose:  40 Gy  Projected Dose:  60 Gy   Narrative:  The patient presents for routine under treatment assessment.  CBCT/MVCT images/Port film x-rays were reviewed.  The chart was checked. She has extreme itching in the upper inner  Quadrant of the left breast.  She is taking oral Benadryl and also using topical Benadryl as well as hydrocortisone. She has tried hydrogel pads but these are not helping. The symptoms are very bothersome in distracting her work. She's not sure she can continue radiotherapy due to the itching  Physical Findings:  weight is 152 lb 3.2 oz (69.037 kg). Her blood pressure is 157/78 and her pulse is 63. Her respiration is 18.  left breast is erythematous with radiation dermatitis in the upper inner quadrant.  Impression:  The patient is tolerating radiotherapy with difficulty.  Plan:  I discussed starting the boost treatments with the patient and putting her whole breast radiotherapy on hold for about a week. She is pleased with this idea. We will start the boost treatments tomorrow and after these 5 treatments she will resume the whole breast radiotherapy. I will reevaluate her in a week. I recommended trying 2 percent lidocaine gel over the area where she is itching. I provided a note to excuse her from work..   ________________________________   Lonie Peak, M.D.

## 2012-05-13 ENCOUNTER — Ambulatory Visit
Admission: RE | Admit: 2012-05-13 | Discharge: 2012-05-13 | Disposition: A | Discharge status: Home / Self Care | Payer: Managed Care, Other (non HMO) | Source: Ambulatory Visit | Attending: Radiation Oncology | Admitting: Radiation Oncology

## 2012-05-14 ENCOUNTER — Ambulatory Visit
Admission: RE | Admit: 2012-05-14 | Discharge: 2012-05-14 | Disposition: A | Discharge status: Home / Self Care | Payer: Managed Care, Other (non HMO) | Source: Ambulatory Visit | Attending: Radiation Oncology | Admitting: Radiation Oncology

## 2012-05-14 ENCOUNTER — Telehealth: Payer: Self-pay | Admitting: Radiation Oncology

## 2012-05-14 NOTE — Telephone Encounter (Signed)
Late entry from 05/13/2012 at 1742. Patient presented to the clinic prior to treatment. Patient expressed that she is upset and so is her husband that no one retrieved him from the waiting area so he could be present during her PUT visit the Dr. Basilio Cairo the night prior. Validated the patient's feelings. Expressed to the patient that we encourage spouse and family involvement. Encouraged patient to remind staff about her husbands desire to be present for those PUT if they forgot. Patient verbalized understanding. Apologized for the event. Called the patient's husband at home and extended the apology to him.

## 2012-05-15 ENCOUNTER — Ambulatory Visit
Admission: RE | Admit: 2012-05-15 | Discharge: 2012-05-15 | Disposition: A | Discharge status: Home / Self Care | Payer: Managed Care, Other (non HMO) | Source: Ambulatory Visit | Attending: Radiation Oncology | Admitting: Radiation Oncology

## 2012-05-16 ENCOUNTER — Ambulatory Visit
Admission: RE | Admit: 2012-05-16 | Discharge: 2012-05-16 | Disposition: A | Discharge status: Home / Self Care | Payer: Managed Care, Other (non HMO) | Source: Ambulatory Visit | Attending: Radiation Oncology | Admitting: Radiation Oncology

## 2012-05-19 ENCOUNTER — Encounter: Payer: Self-pay | Admitting: Radiation Oncology

## 2012-05-19 ENCOUNTER — Ambulatory Visit
Admission: RE | Admit: 2012-05-19 | Discharge: 2012-05-19 | Disposition: A | Discharge status: Home / Self Care | Payer: Managed Care, Other (non HMO) | Source: Ambulatory Visit | Attending: Radiation Oncology | Admitting: Radiation Oncology

## 2012-05-19 VITALS — BP 152/83 | HR 65 | Resp 18 | Wt 150.5 lb

## 2012-05-19 DIAGNOSIS — C50419 Malignant neoplasm of upper-outer quadrant of unspecified female breast: Secondary | ICD-10-CM

## 2012-05-19 MED ORDER — BIAFINE EX EMUL
Freq: Every day | CUTANEOUS | Status: DC
  Administered 2012-05-19: 18:00:00 via TOPICAL

## 2012-05-19 NOTE — Progress Notes (Signed)
   Weekly Management Note Current Dose:  50 Gy  Projected Dose: 60 Gy   Narrative:  The patient presents for routine under treatment assessment.  CBCT/MVCT images/Port film x-rays were reviewed.  The chart was checked. The radiation dermatitis resolved nicely when she took a week off from whole breast radiation and received her boost treatments. She is using Biafine over her skin has decided not to use the lidocaine. She is very fatigued- plan to cut down to half a day at work.  Physical Findings:  weight is 150 lb 8 oz (68.266 kg). Her blood pressure is 152/83 and her pulse is 65. Her respiration is 18.  radiation dermatitis has settled down in the upper inner quadrant. Her left breast and axilla are diffusely erythematous with no  desquamation.  Impression:  The patient is tolerating radiotherapy.  Plan:  Continue radiotherapy as planned. We will resume her whole breast radiation tomorrow. Patient is agreeable with this.  ________________________________   Lonie Peak, M.D.

## 2012-05-19 NOTE — Progress Notes (Signed)
Patient presents to the clinic today accompanied by her husband for a PUT with Dr. Basilio Cairo. Patient is alert and oriented to person, place, and time. No distress noted. Steady gait noted. Pleasant affect noted. Patient denies pain at this time. However, patient reports discomfort and tenderness of left/treated breast. Patient big complaints is marked fatigue. Patient reports that she worked a full day today but, remained in the bed most of the weekend. Patient reports that she plans to take half day tomorrow but, reports that will put her behind at work. Patient reports she is unable to walk each day. Validated patient's feelings and attempt to provide reassurance that once treatment was complete her energy level would improve with time. Patient states,"I invite anyone who is telling patient's these things to walk in my shoes." patient reports that her rash has greatly improved. Patient reports she continues to use Biafine but, refuses to use lidocaine. Provided patient with additional tube of biafine. Reported all findings to Dr. Basilio Cairo.

## 2012-05-20 ENCOUNTER — Ambulatory Visit
Admission: RE | Admit: 2012-05-20 | Discharge: 2012-05-20 | Disposition: A | Discharge status: Home / Self Care | Payer: Managed Care, Other (non HMO) | Source: Ambulatory Visit | Attending: Radiation Oncology | Admitting: Radiation Oncology

## 2012-05-21 ENCOUNTER — Ambulatory Visit
Admission: RE | Admit: 2012-05-21 | Discharge: 2012-05-21 | Disposition: A | Discharge status: Home / Self Care | Payer: Managed Care, Other (non HMO) | Source: Ambulatory Visit | Attending: Radiation Oncology | Admitting: Radiation Oncology

## 2012-05-22 ENCOUNTER — Ambulatory Visit
Admission: RE | Admit: 2012-05-22 | Discharge: 2012-05-22 | Disposition: A | Discharge status: Home / Self Care | Payer: Managed Care, Other (non HMO) | Source: Ambulatory Visit | Attending: Radiation Oncology | Admitting: Radiation Oncology

## 2012-05-23 ENCOUNTER — Ambulatory Visit: Payer: Managed Care, Other (non HMO)

## 2012-05-23 ENCOUNTER — Ambulatory Visit
Admission: RE | Admit: 2012-05-23 | Discharge: 2012-05-23 | Disposition: A | Discharge status: Home / Self Care | Payer: Managed Care, Other (non HMO) | Source: Ambulatory Visit | Attending: Radiation Oncology | Admitting: Radiation Oncology

## 2012-05-26 ENCOUNTER — Ambulatory Visit: Payer: Managed Care, Other (non HMO)

## 2012-05-26 ENCOUNTER — Encounter: Payer: Self-pay | Admitting: Radiation Oncology

## 2012-05-26 ENCOUNTER — Ambulatory Visit
Admission: RE | Admit: 2012-05-26 | Discharge: 2012-05-26 | Disposition: A | Discharge status: Home / Self Care | Payer: Managed Care, Other (non HMO) | Source: Ambulatory Visit | Attending: Radiation Oncology | Admitting: Radiation Oncology

## 2012-05-26 ENCOUNTER — Telehealth: Payer: Self-pay | Admitting: Oncology

## 2012-05-26 VITALS — BP 130/83 | HR 66 | Resp 18 | Wt 151.0 lb

## 2012-05-26 DIAGNOSIS — C50419 Malignant neoplasm of upper-outer quadrant of unspecified female breast: Secondary | ICD-10-CM

## 2012-05-26 NOTE — Telephone Encounter (Signed)
Pt. Requesting all her medical records. Printed pt. Med. Rec. For pt to pick up.

## 2012-05-26 NOTE — Progress Notes (Signed)
   Weekly Management Note Completed Radiotherapy. Total Dose:  60 Gy   Narrative:  The patient presents for routine under treatment assessment on last day of radiotherapy.  CBCT/MVCT images/Port film x-rays were reviewed.  The chart was checked. She reports that the dermatitis in her upper inner quadrant is starting to itch a quite a bit again. She decided to take today off of work due to fatigue. Using Biafine only over her skin  Physical Findings:  weight is 151 lb (68.493 kg). Her blood pressure is 130/83 and her pulse is 66. Her respiration is 18.  left breast is diffusely erythematous. There is radiation dermatitis in the upper inner quadrant but it's not as bad as it had looked 2 weeks ago. The lateral aspect of her breast at the lumpectomy scar is most erythematous. There is minimal dry desquamation over her breast.  Impression:  The patient has tolerated radiotherapy.  Plan:  Routine follow-up in one month. Continue Biafine. ________________________________   Lonie Peak, M.D.

## 2012-05-26 NOTE — Progress Notes (Signed)
Patient presents to the clinic today accompanied by her husband for a PUT with Dr. Basilio Cairo. Patient is alert and oriented to person, place, and time. No distress noted. Steady gait noted. Pleasant affect noted. Patient denies pain at this time. However, patient reports occasional brief sharp shooting pains in her left breast which she understands are related to healing. Radiation dermatitis in the center of the chest wall has greatly improved. Hyperpigmentation of the left breast noted mostly area closest to axilla. Patient reports using Biafine as directed. Encouraged patient to continue to apply Biafine for two more weeks. Provided another tube. Provided patient with follow up appointment card. Patient reports that she did not go to work today because she is so tired. Encouraged patient to call with needs. Patient verbalized understanding of all reviewed. Reported all findings to Dr. Basilio Cairo.

## 2012-05-27 ENCOUNTER — Encounter: Payer: Self-pay | Admitting: Radiation Oncology

## 2012-05-27 ENCOUNTER — Ambulatory Visit: Payer: Managed Care, Other (non HMO)

## 2012-05-27 NOTE — Progress Notes (Deleted)
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  Therefore, after 20 fractions to her left breast/ axilla, she was given a week's break from whole breast radiotherapy while receiving her 5 lumpectomy boost treatments.  After these treatments, the patient received a final week of radiotherapy to the breast and axilla. Her dermatitis responded nicely to this approach.  Plan: The patient has completed radiation treatment. The patient will return to radiation oncology clinic for routine followup in one month. I advised them to call or return sooner if they have any questions or concerns related to their recovery or treatment.  -----------------------------------  Lonie Peak, MD

## 2012-05-28 MED ORDER — BIAFINE EX EMUL
Freq: Every day | CUTANEOUS | Status: AC
  Administered 2012-05-28: 14:00:00 via TOPICAL

## 2012-05-28 NOTE — Addendum Note (Signed)
Encounter addended by: Agnes Lawrence, RN on: 05/28/2012  2:00 PM<BR>     Documentation filed: Inpatient MAR, Orders

## 2012-06-04 ENCOUNTER — Telehealth: Payer: Self-pay | Admitting: Oncology

## 2012-06-04 ENCOUNTER — Encounter: Payer: Self-pay | Admitting: Oncology

## 2012-06-04 ENCOUNTER — Ambulatory Visit (HOSPITAL_BASED_OUTPATIENT_CLINIC_OR_DEPARTMENT_OTHER): Payer: Managed Care, Other (non HMO) | Admitting: Oncology

## 2012-06-04 VITALS — BP 152/77 | HR 80 | Temp 97.5°F | Resp 20 | Ht 64.0 in | Wt 150.0 lb

## 2012-06-04 DIAGNOSIS — Z170 Estrogen receptor positive status [ER+]: Secondary | ICD-10-CM

## 2012-06-04 DIAGNOSIS — C50919 Malignant neoplasm of unspecified site of unspecified female breast: Secondary | ICD-10-CM

## 2012-06-04 DIAGNOSIS — C50419 Malignant neoplasm of upper-outer quadrant of unspecified female breast: Secondary | ICD-10-CM

## 2012-06-04 DIAGNOSIS — C773 Secondary and unspecified malignant neoplasm of axilla and upper limb lymph nodes: Secondary | ICD-10-CM

## 2012-06-04 MED ORDER — LETROZOLE 2.5 MG PO TABS: 2.5000 mg | ORAL_TABLET | Freq: Every day | ORAL | Status: DC

## 2012-06-04 NOTE — Telephone Encounter (Signed)
gve the pt her nov 2013 appt calendar °

## 2012-06-04 NOTE — Progress Notes (Signed)
OFFICE PROGRESS NOTE  CC  [name],[name], MD [address] [place] [address] Dr. Felizardo Hoffmann Dr. 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 Patient is status post lumpectomy with sentinel node biopsy performed on 03/05/2012  PRIOR THERAPY:  #1 patient was originally seen at the multidisciplinary breast clinic. At that time she was recommended a lumpectomy with sentinel node biopsy. Based on her final pathology we recommended sending a Oncotype testing on her as well.  #2 patient has gone on to have a lumpectomy with sentinel node biopsy on 03/05/2012. Her final pathology revealed a 2.0 cm invasive ductal carcinoma. ER positive PR positive HER-2/neu negative grade 1. One sentinel node was positive for macro metastatic disease with no extracapsular extension. There was a close posterior margin at the fascia.  #3 patient had Oncotype DX testing performed on her final specimen and her recurrence score was 5 with an average rate of distant recurrence of only 5%. This patient is now being recommended adjuvant chemotherapy.  #4 patient will begin radiation therapy postoperatively and she will be seen by Dr. Doristine Devoid on 03/26/2012.once patient completes radiation therapy then we will plan on giving her antiestrogen therapy and I discussed aromatase inhibitors with her as well as tamoxifen.  5. S/p radiation therapy to the left breast complete on 05/26/12  CURRENT THERAPY:patient to begin adjuvant letrozole 2.5 mg daily INTERVAL HISTORY: Gina [name] 66 y.o. female returns for followup visit post radiation. Overall she did well with radiation. She did develop weakness and fatigue as well as skin changes secondary to the RT, no nausea or vomiting no fevers or chills no headaches.  Remainder of the 10 point review of systems is negative. MEDICAL HISTORY: Past Medical History  Diagnosis Date  . Ovarian cyst   . Hypertension   . Insulin pump in place   . 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 Diabetes mellitus     Insulin pump  . Anxiety     new dx  . Hyperlipidemia     ALLERGIES:   has no known allergies.  MEDICATIONS:  reviewed  SURGICAL HISTORY:  Past Surgical History  Procedure Date  . Tonsillectomy   . Ovarian cyst surgery   . Colonoscopy     1 polyp   . Breast surgery 03/05/2012    Left Breast Lumpectomy: 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: 1/1 Node Positive for Mets.: ER/PR POs., Her 2 Neu negative, Ki-67 12%  . Appendectomy     REVIEW OF SYSTEMS:  Pertinent items are noted in HPI.   PHYSICAL EXAMINATION: General appearance: alert, cooperative and appears stated age Resp: clear to auscultation bilaterally and normal percussion bilaterally Back: symmetric, no curvature. ROM normal. No CVA tenderness. Cardio: regular rate and rhythm, S1, S2 normal, no murmur, click, rub or gallop GI: soft, non-tender; bowel sounds normal; no masses,  no organomegaly Extremities: extremities normal, atraumatic, no cyanosis or edema Neurologic: Grossly normal Breast examination left breast reveals a healed incisional scar in the upper outer quadrant as well as the sentinel node incisional scar. There is some redness erythema no nipple discharge no skin changes otherwise. Right breast no masses or nipple discharge.  ECOG PERFORMANCE STATUS: 0 -  Asymptomatic  Blood pressure 152/77, pulse 80, temperature 97.5 F (36.4 C), resp. rate 20, height 5\' 4"  (1.626 m), weight 150 lb (68.04 kg).  LABORATORY DATA: Lab Results  Component Value Date   WBC 3.1* 01/30/2012   HGB 13.5 03/05/2012   HCT 39.5 01/30/2012   MCV 90.4 01/30/2012   PLT 172 01/30/2012        Chemistry      Component Value Date/Time   NA 132* 02/29/2012 1350   K 4.9 02/29/2012 1350   CL 93* 02/29/2012 1350   CO2 30 02/29/2012 1350   BUN 8 02/29/2012 1350   CREATININE 0.64 02/29/2012 1350      Component Value Date/Time   CALCIUM 9.3 02/29/2012 1350   ALKPHOS 85 01/30/2012 0814   AST 30 01/30/2012 0814   ALT 29 01/30/2012 0814   BILITOT 0.5 01/30/2012 0814     Visit #: [id number] REPORT OF SURGICAL PATHOLOGY ADDITIONAL INFORMATION: 1. A sample (Block 1G) was sent to [facility] for Oncotype testing. The patient's recurrence score is 5. Those patients who had a recurrence score of 5 had an average rate of distant recurrence of 5%. (JK:mw 03-18-12) Pecola Leisure MD Pathologist, Electronic Signature ( Signed 03/18/2012) 1. CHROMOGENIC IN-SITU HYBRIDIZATION Interpretation HER-2/NEU BY CISH - NO AMPLIFICATION OF HER-2 DETECTED. THE RATIO OF HER-2: CEP 17 SIGNALS WAS 1.00. Reference range: Ratio: HER2:CEP17 < 1.8 - gene amplification not observed Ratio: HER2:CEP 17 1.8-2.2 - equivocal result Ratio: HER2:CEP17 > 2.2 - gene amplification observed Pecola Leisure MD Pathologist, Electronic Signature ( Signed 03/12/2012) FINAL DIAGNOSIS Diagnosis 1. Breast, lumpectomy, Left breast - INVASIVE DUCTAL CARCINOMA, 2 CM. - DUCTAL CARCINOMA IN SITU WITH CALCIFICATIONS. 1 of 3 FINAL for [facility], [place] [phone]) Diagnosis(continued) - INVASIVE CARCINOMA LESS THAN 0.1 CM FROM POSTERIOR MARGIN. - FOCAL LYMPHATIC VASCULAR INVOLVEMENT BY TUMOR. 2. Lymph node, sentinel, biopsy, Left axilla - METASTATIC CARCINOMA IN ONE LYMPH NODE (1/1). Microscopic Comment 1. BREAST, INVASIVE TUMOR, WITH LYMPH NODE SAMPLING Specimen, including laterality: Left breast Procedure: Lumpectomy and sentinel lymph node Grade: I Tubule formation: 2 Nuclear pleomorphism: 2 Mitotic: 1 Tumor size (gross measurement): 2 cm Margins: Free of tumor Invasive, distance to closest margin: Less than 0.1 cm  from posterior margin In-situ, distance to closest margin: 0.3 cm from posterior margin If margin positive, focally or broadly: N/A Lymphovascular invasion: Present Ductal carcinoma in situ: Present Grade: Intermediate Extensive intraductal component: No Lobular neoplasia: No Tumor focality: Unifocal Treatment effect: No If present, treatment effect in breast tissue, lymph nodes or both: N/A Extent of tumor: Skin: N/A Nipple: N/A Skeletal muscle: N/A Lymph nodes: # examined: 1 Lymph nodes with metastasis: 1 Isolated tumor cells (< 0.2 mm): 0 Micrometastasis: (> 0.2 mm and < 2.0 mm): 0 Macrometastasis: (> 2.0 mm): 1 Extracapsular extension: No Breast prognostic profile: Case [id number] Estrogen receptor: 99%, positive Progesterone receptor: 100%, positive Her 2 neu: No amplification, ratio 1.19. Will be repeated on current specimen. Ki-67: 12% Non-neoplastic breast: Fibrocystic changes. TNM: pT1c, pN1a, pMX (JDP:kh 03-07-12) Jimmy Picket MD Pathologist, Electronic Signature (Case signed 03/07/2012) Specimen Gross and Clinical Information 2 of  RADIOGRAPHIC STUDIES:  ASSESSMENT: 66 year old female with  #1 stage II (T1 C. N1 a) low-grade invasive ductal carcinoma of the left breast she is now status post lumpectomy with the final pathology revealing a 2.0 cm invasive ductal carcinoma with a posterior margin that is less than 0.1 cm. One sentinel node was positive for metastatic disease without any extracapsular extension.  #2 patient  had Oncotype DX testing performed with a recurrence score of only 5 giving her a distant recurrence of only 9% with 5 years of tamoxifen. Patient and I discussed this at great lengths today. We discussed the rationale for obtaining this test.  #3 She haas completed radiation therapy.  PLAN:   #1 patient finished the radiation therapy and nowshe will go on to antiestrogen therapy with letrozole 2.5 mg daily. We discussed the risks and benefits.  Patient unsure if she wants to take it or not. She wants to look into other alternatives. I did give her a prescription for letrozole. She will fill it and let me know if she is going to take it.  #2 I will see her back in 3 months or sooner.  #3 patient did speak to Jule Ser regarding her other concerns about billing  All questions were answered. The patient knows to call the clinic with any problems, questions or concerns. We can certainly see the patient much sooner if necessary.  I spent >25 minutes counseling the patient face to face. The total time spent in the appointment was 30 minutes.    Drue Second, MD Medical/Oncology [facility] [phone] (beeper) [phone] (Office)  06/04/2012, 9:12 AM

## 2012-06-04 NOTE — Patient Instructions (Addendum)
Prescription for letrozole 2.5 mg daily basis  Please vitamin D3 2000 iu daily  Please get a copy of the bone density results to [place] [phone] (fax)  I will see you back in November 2013

## 2012-06-06 ENCOUNTER — Encounter (INDEPENDENT_AMBULATORY_CARE_PROVIDER_SITE_OTHER): Payer: Self-pay | Admitting: General Surgery

## 2012-06-06 ENCOUNTER — Ambulatory Visit (INDEPENDENT_AMBULATORY_CARE_PROVIDER_SITE_OTHER): Payer: Managed Care, Other (non HMO) | Admitting: General Surgery

## 2012-06-06 VITALS — BP 140/72 | HR 74 | Ht 64.0 in | Wt 151.6 lb

## 2012-06-06 DIAGNOSIS — C50419 Malignant neoplasm of upper-outer quadrant of unspecified female breast: Secondary | ICD-10-CM

## 2012-06-06 NOTE — Progress Notes (Signed)
Chief complaint: Followup cancer of the breast  History: Patient returns for more long-term followup approximately 3 months following left breast lumpectomy and sentinel lymph node biopsy for T1 C. N 1 invasive cancer of the left breast. she has completed radiation therapy.  Dr. Park Breed has recommended anti-estrogen therapy the patient is concerned about side effects and is still thinking about her options. She reports no problems from the surgical site.  Exam: Axillary and breast wounds are well healed with some slight induration of the breast wound and minimal post radiation changes.  Assessment and plan: Doing well following surgery and radiation for cancer of the left breast. I told her that I thought systemic treatment is very important with her positive lymph node.  Questions were answered I will plan to see her back in 4 months.  She has an appointment with Dr. Park Breed in the near future.

## 2012-06-09 NOTE — Addendum Note (Signed)
Encounter addended by: Lonie Peak, MD on: 06/09/2012  3:24 PM<BR>     Documentation filed: Notes Section

## 2012-06-09 NOTE — Progress Notes (Signed)
[employer], operational Note  Diagnosis: Breast Cancer   The patient's CT images from her initial simulation were reviewed to plan her boost treatment to her left breast  lumpectomy cavity.  Measurements were made regarding the size and depth of the surgical bed. The boost to the lumpectomy cavity will be delivered with 12 MeV electrons prescribed to the 100% isodose line.   A special port plan was reviewed and approved. A custom electron cut-out will be used for her boost field.  10 Gy in 5 fractions has been prescribed.

## 2012-06-09 NOTE — Progress Notes (Addendum)
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  Therefore, after 20 fractions to her left breast/ axilla, she was given a week's break from whole breast radiotherapy while receiving her 5 lumpectomy boost treatments.  After these treatments, the patient received a final week of radiotherapy to the breast and axilla. Her dermatitis responded nicely to this approach.  Plan: The patient has completed radiation treatment. The patient will return to radiation oncology clinic for routine followup in one month. I advised them to call or return sooner if they have any questions or concerns related to their recovery or treatment.  -----------------------------------  Lonie Peak, MD

## 2012-06-13 DIAGNOSIS — Z79811 Long term (current) use of aromatase inhibitors: Secondary | ICD-10-CM

## 2012-06-13 HISTORY — DX: Long term (current) use of aromatase inhibitors: Z79.811

## 2012-06-20 ENCOUNTER — Ambulatory Visit: Payer: Managed Care, Other (non HMO) | Admitting: Radiation Oncology

## 2012-06-27 ENCOUNTER — Ambulatory Visit: Payer: Managed Care, Other (non HMO) | Admitting: Radiation Oncology

## 2012-07-04 ENCOUNTER — Ambulatory Visit
Admission: RE | Admit: 2012-07-04 | Discharge: 2012-07-04 | Disposition: A | Discharge status: Home / Self Care | Payer: Managed Care, Other (non HMO) | Source: Ambulatory Visit | Attending: Radiation Oncology | Admitting: Radiation Oncology

## 2012-07-04 ENCOUNTER — Encounter: Payer: Self-pay | Admitting: Radiation Oncology

## 2012-07-04 VITALS — BP 130/64 | HR 68 | Temp 97.5°F | Wt 152.1 lb

## 2012-07-04 DIAGNOSIS — C50419 Malignant neoplasm of upper-outer quadrant of unspecified female breast: Secondary | ICD-10-CM | POA: Insufficient documentation

## 2012-07-04 NOTE — Progress Notes (Signed)
Radiation Oncology         (336) [phone] ________________________________  Name: Gina [name] MRN: [id number]  Date: 07/04/2012  DOB: Jul 12, 1946  Follow-Up Visit Note  CC: Leo Grosser, MD  Donita Brooks, MD  Diagnosis:  T1cN1a M0 left breast cancer  Interval Since Last Radiation: 5-6 weeks  Narrative:  The patient returns today for routine follow-up.   She is doing well.  Her skin has healed well.  She has decided to decline on Femara.  Has instead chosen a "holistic" therapy recommended by a holistic provider, but she is not sure of its name.                             ALLERGIES:   has no known allergies.  Meds: Current Outpatient Prescriptions  Medication Sig Dispense Refill  . alendronate (FOSAMAX) 70 MG tablet Take 70 mg by mouth every 7 (seven) days. Take with a full glass of water on an empty stomach.      Marland Kitchen aspirin 81 MG tablet Take 81 mg by mouth daily.      . cholecalciferol (VITAMIN D) 1000 UNITS tablet Take 2,000 Units by mouth daily.      Marland Kitchen co-enzyme Q-10 50 MG capsule Take 400 mg by mouth daily.      Marland Kitchen glucosamine-chondroitin 500-400 MG tablet Take 1 tablet by mouth daily.       Marland Kitchen glucose 4 GM chewable tablet Chew 16 g by mouth as needed.      . insulin lispro (HUMALOG) 100 UNIT/ML injection Inject into the skin. 23.25 units basal 1:C ratio 1U to 10 Carbs      . Methylsulfonylmethane (MSM PO) Take 2,000 each by mouth daily.      . naproxen sodium (ANAPROX) 220 MG tablet Take 220 mg by mouth as needed.      . NON FORMULARY Take 600 mg by mouth daily. Tumeric Extract      . olmesartan (BENICAR) 20 MG tablet Take 10 mg by mouth daily.      . vitamin A 40981 UNIT capsule Take 10,000 Units by mouth daily.      . calcium carbonate (TUMS - DOSED IN MG ELEMENTAL CALCIUM) 500 MG chewable tablet Chew 1 tablet by mouth daily.       No current facility-administered medications for this encounter.   Facility-Administered Medications Ordered in Other Encounters    Medication Dose Route Frequency Provider Last Rate Last Dose  . topical emolient (BIAFINE) emulsion   Topical Daily Lonie Peak, MD        Physical Findings: The patient is in no acute distress. Patient is alert and oriented.  weight is 152 lb 1.6 oz (68.992 kg). Her temperature is 97.5 F (36.4 C). Her blood pressure is 130/64 and her pulse is 68. .  No significant changes. Left breast has healed well.  Mild residual hyperpigmentation, skin is intact.  Lab Findings: Lab Results  Component Value Date   WBC 3.1* 01/30/2012   HGB 13.5 03/05/2012   HCT 39.5 01/30/2012   MCV 90.4 01/30/2012   PLT 172 01/30/2012      Radiographic Findings: No results found.  Impression:  The patient is recovering from the effects of radiation.    Plan:  I recommended vitamin E cream for further breast healing.  At length, I discussed the importance of anti-estrogen therapy to optimize her prognosis.  She will think about this, but I am not  confident she will chose to take Femara. I will schedule f/u with mammography and a visit with her in the end of April 2014. _____________________________________   Lonie Peak, MD

## 2012-07-04 NOTE — Progress Notes (Signed)
FU appt. Today.  Received Radiation to the left breast which completed on 05/26/12.  States she has periods of fatigue intermittently.  Denies any pain.  Reports diabetes under control.  Continues to have some hyperpigmentation in the tx. Field.  Skin intact.

## 2012-07-07 ENCOUNTER — Telehealth: Payer: Self-pay | Admitting: *Deleted

## 2012-07-07 NOTE — Telephone Encounter (Signed)
CALLED PATIENT TO INFORM OF TEST AND FU VISIT, LVM FOR A RETURN CALL 

## 2012-08-20 ENCOUNTER — Encounter: Payer: Self-pay | Admitting: Oncology

## 2012-08-20 NOTE — Progress Notes (Signed)
Faxed cancer claim to [facility] @ [phone].

## 2012-08-25 ENCOUNTER — Telehealth: Payer: Self-pay | Admitting: Radiation Oncology

## 2012-08-25 NOTE — Telephone Encounter (Signed)
[employer] cancer claim forms mailed to pt today w path report, per patient.

## 2012-09-01 ENCOUNTER — Ambulatory Visit: Payer: Managed Care, Other (non HMO) | Admitting: Oncology

## 2012-09-01 ENCOUNTER — Other Ambulatory Visit: Payer: Managed Care, Other (non HMO) | Admitting: Lab

## 2013-01-28 ENCOUNTER — Encounter: Payer: Self-pay | Admitting: *Deleted

## 2013-01-28 ENCOUNTER — Encounter: Payer: Self-pay | Admitting: Radiation Oncology

## 2013-01-30 ENCOUNTER — Ambulatory Visit: Payer: Managed Care, Other (non HMO) | Admitting: Radiation Oncology

## 2013-02-03 ENCOUNTER — Encounter: Payer: Self-pay | Admitting: Radiation Oncology

## 2013-02-03 ENCOUNTER — Ambulatory Visit
Admission: RE | Admit: 2013-02-03 | Discharge: 2013-02-03 | Disposition: A | Discharge status: Home / Self Care | Payer: Medicare Other | Source: Ambulatory Visit | Attending: Radiation Oncology | Admitting: Radiation Oncology

## 2013-02-03 VITALS — BP 139/59 | HR 67 | Temp 97.4°F | Wt 147.0 lb

## 2013-02-03 DIAGNOSIS — C50412 Malignant neoplasm of upper-outer quadrant of left female breast: Secondary | ICD-10-CM

## 2013-02-03 NOTE — Progress Notes (Signed)
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  She is taking holistic medication in lieu of Femara, still unsure of the name(s) of the medication(s).     Mammography on 01/26/13 was BIRADS 2.  She has some tightness in the left axilla. Has been trying to exercise arm to loosen it up. No swelling in arm/hand.                    ALLERGIES:  has No Known Allergies.  Meds: Current Outpatient Prescriptions  Medication Sig Dispense Refill  . alendronate (FOSAMAX) 70 MG tablet Take 70 mg by mouth every 7 (seven) days. Take with a full glass of water on an empty stomach.      Marland Kitchen aspirin 81 MG tablet Take 81 mg by mouth daily.      . calcium carbonate (TUMS - DOSED IN MG ELEMENTAL CALCIUM) 500 MG chewable tablet Chew 1 tablet by mouth daily.      . cholecalciferol (VITAMIN D) 1000 UNITS tablet Take 2,000 Units by mouth daily.      Marland Kitchen co-enzyme Q-10 50 MG capsule Take 400 mg by mouth daily.      Marland Kitchen glucosamine-chondroitin 500-400 MG tablet Take 1 tablet by mouth daily.       Marland Kitchen glucose 4 GM chewable tablet Chew 16 g by mouth as needed.      . insulin lispro (HUMALOG) 100 UNIT/ML injection Inject into the skin. 23.25 units basal 1:C ratio 1U to 10 Carbs      . Methylsulfonylmethane (MSM PO) Take 2,000 each by mouth daily.      . naproxen sodium (ANAPROX) 220 MG tablet Take 220 mg by mouth as  needed.      . NON FORMULARY Take 600 mg by mouth daily. Tumeric Extract      . olmesartan (BENICAR) 20 MG tablet Take 10 mg by mouth daily.      . vitamin A 40981 UNIT capsule Take 10,000 Units by mouth daily.       No current facility-administered medications for this encounter.   Facility-Administered Medications Ordered in Other Encounters  Medication Dose Route Frequency Provider Last Rate Last Dose  . topical emolient (BIAFINE) emulsion   Topical Daily Lonie Peak, MD        Physical Findings: The patient is in no acute distress. Patient is alert and oriented.  weight is 147 lb (66.679 kg). Her temperature is 97.4 F (36.3 C). Her blood pressure is 139/59 and her pulse is 67.  No neck adenopathy. No concerning lesions in either breast and no palpable nodes in axillary regions.  Mild hyperpgimentation over left breast.  Lymphedema vs fibrosis in the lateral lower left chest wall adjacent to breast. No swelling in UE's. Good ROM is shoulders.  Lab Findings: Lab Results  Component Value Date   WBC 3.1* 01/30/2012  HGB 13.5 03/05/2012   HCT 39.5 01/30/2012   MCV 90.4 01/30/2012   PLT 172 01/30/2012     Radiographic Findings: As above  Impression/Plan: Doing well, no evidence of recurrence.  Refer to PT for sense of  tightness in axilla, lymphedema vs fibrosis in lateral chest wall.  F/u in 1 yr; will order mammography for mid April 2015.  Pt encouraged to call if questions prior to then.  I spent 20 minutes minutes face to face with the patient and more than 50% of that time was spent in counseling and/or coordination of care. _____________________________________   Lonie Peak, MD

## 2013-02-03 NOTE — Progress Notes (Signed)
Ms. Cogliano here for assessment following radiation therapy to her left breast.  She c/o soreness in the upper part of her Left breast and in the left lateral breast.  She denies any overt pain, but she states her left breast feels denser than before surgery.  She also feels a pulling sensation when she raises her left arm in the air and she notes an intermitent pulling sensation in her left forearm. She just had a mammogram at [facility].

## 2013-02-04 ENCOUNTER — Telehealth: Payer: Self-pay | Admitting: *Deleted

## 2013-02-04 NOTE — Telephone Encounter (Signed)
Called pt. To inform of mammo. On 01-27-14- arrival time - 10:15 am , at [facility] and fu visit on 01-29-14 at 3:00 pm, spoke with patient and she is aware of these appts.

## 2013-02-04 NOTE — Telephone Encounter (Addendum)
Gina [name] called and informed that her mammogram was received from [facility] and it reports "No Evidence of Malignancy".

## 2013-02-11 ENCOUNTER — Ambulatory Visit: Payer: Medicare Other | Attending: General Surgery | Admitting: Physical Therapy

## 2013-04-29 ENCOUNTER — Encounter: Payer: Self-pay | Admitting: Endocrinology

## 2013-06-19 ENCOUNTER — Other Ambulatory Visit: Payer: Self-pay | Admitting: *Deleted

## 2013-06-19 DIAGNOSIS — E119 Type 2 diabetes mellitus without complications: Secondary | ICD-10-CM

## 2013-06-23 ENCOUNTER — Other Ambulatory Visit: Payer: Medicare Other

## 2013-06-25 ENCOUNTER — Ambulatory Visit (INDEPENDENT_AMBULATORY_CARE_PROVIDER_SITE_OTHER): Payer: Medicare Other | Admitting: Endocrinology

## 2013-06-25 ENCOUNTER — Encounter: Payer: Self-pay | Admitting: Endocrinology

## 2013-06-25 VITALS — BP 135/68 | HR 78 | Temp 97.7°F | Ht 63.5 in | Wt 138.4 lb

## 2013-06-25 DIAGNOSIS — I10 Essential (primary) hypertension: Secondary | ICD-10-CM | POA: Insufficient documentation

## 2013-06-25 DIAGNOSIS — E1065 Type 1 diabetes mellitus with hyperglycemia: Secondary | ICD-10-CM

## 2013-06-25 NOTE — Progress Notes (Addendum)
Patient ID: Gina [name], female   DOB: 1945/11/24, 67 y.o.   MRN: [id number]  Gina [name] is an 67 y.o. female.   Reason for Appointment: Insulin Pump followup:   History of Present Illness   Diagnosis: Type 1 DIABETES MELITUS, date of diagnosis:  1984     CURRENT insulin pump:  One Touch Ping, has been on a pump since 1995  HISTORY: She has had overall fair control of her diabetes with insulin pump and A1c usually under 7%. However on her last visit she was having more frequent low sugars early morning. She thinks that she was again having low blood sugars recently overnight but does not appear to have made any changes in her pump settings except reducing by 0.05 at midnight. She is also having periodic HYPOGLYCEMIA may be an afternoon. She is still having some hypoglycemia on waking up with readings in the 50s and 60s. However she is tending to have more HYPERGLYCEMIA after breakfast and periodically midday and late evening also She has variable readings from day to day with occasional basis where she has more significant hyperglycemia and her lowest reading was 35 at about 1 AM, preceded by a bedtime reading of 82 and needing correction boluses 3 times the previous day  The pump SETTINGS are: Basal rate: Mn  Carb Ratio: 1: 0.50; 5 AM = 1.0, 10 AM = 1.2, 12 noon = 1.0, 4 PM = 0.8 and 9 PM = 0.75  Correction factor 1: 30 with target 110, carbohydrate ratio 1:10  GLUCOSE CONTROL with the pump is assessed today by pump download. Recent fasting blood sugars: 51-188, midday 51-265, suppertime 71-194 and late evening/PC supper 106-193. Overall median 124 but highest median ridge morning of 209 and lowest of 50 overnight Pump management: She is bolusing about 3 times a day, average carbohydrate intake 32 g per meal  HYPOGLYCEMIC episodes are  Overnight, 6-8 am and 11-1  Previous A1c 6.5 done in 6/14 and on 06/16/13 was 7.1%  EXERCISE: Gardening  MICROALBUMIN has been tested, and the  result is normal .     Medication List       This list is accurate as of: 06/25/13  9:42 AM.  Always use your most recent med list.               alendronate 70 MG tablet  Commonly known as:  FOSAMAX  Take 70 mg by mouth every 7 (seven) days. Take with a full glass of water on an empty stomach.     aspirin 81 MG tablet  Take 81 mg by mouth daily.     calcium carbonate 500 MG chewable tablet  Commonly known as:  TUMS - dosed in mg elemental calcium  Chew 1 tablet by mouth daily.     co-enzyme Q-10 50 MG capsule  Take 400 mg by mouth daily.     glucosamine-chondroitin 500-400 MG tablet  Take 1 tablet by mouth daily.     glucose 4 GM chewable tablet  Chew 16 g by mouth as needed.     insulin lispro 100 UNIT/ML injection  Commonly known as:  HUMALOG  Inject into the skin. 23.25 units basal 1:C ratio 1U to 10 Carbs     MSM PO  Take 2,000 each by mouth daily.     naproxen sodium 220 MG tablet  Commonly known as:  ANAPROX  Take 220 mg by mouth as needed.     NON FORMULARY  Take 600 mg by  mouth daily. Tumeric Extract     olmesartan 20 MG tablet  Commonly known as:  BENICAR  Take 10 mg by mouth daily.     vitamin A 16109 UNIT capsule  Take 10,000 Units by mouth daily.     Vitamin D-3 5000 UNITS Tabs  Take 5,000 Units by mouth daily.        Allergies: No Known Allergies  Past Medical History  Diagnosis Date  . Ovarian cyst   . Hypertension   . Insulin pump in place   . 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 Diabetes mellitus     Insulin pump  . Anxiety     new dx  . Hyperlipidemia   . S/P radiation therapy 04/10/12- 05/26/2012    left Breast and Axilla / 50 gy / 25 Fractions with Left Breast Boost / 10 Gy / 5 Fractions  . Use of letrozole (Femara) 06/13/12    Past Surgical History  Procedure Laterality Date  . Tonsillectomy    . Ovarian cyst surgery    . Colonoscopy       1 polyp   . 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 Appendectomy      Family History  Problem Relation Age of Onset  . Breast cancer Mother 35  . Prostate cancer Maternal Uncle     died in his 53s    Social History:  reports that she has never smoked. She does not have any smokeless tobacco history on file. She reports that she does not drink alcohol or use illicit drugs.  REVIEW of systems:  Syncope recently, at lunch and has had this 2-3x since June. Not associated with hypoglycemia and recovers spontaneously although features of seizure  HYPERCHOLESTEROLEMIA: Her LDL has been persistently high and on her last visit she agreed to start pravastatin. However she is out of this couple of weeks before her labs were done.  EXAM:  BP 130/70  Pulse 78  Temp(Src) 97.7 F (36.5 C) (Oral)  Ht 5' 3.5" (1.613 m)  Wt 138 lb 6.4 oz (62.778 kg)  BMI 24.13 kg/m2  SpO2 94%  ASSESSMENT:  DIABETES: Her blood sugars are somewhat more labile and continues to have episodes of hypoglycemia overnight and now in the morning also. She is however still having relatively higher A1c of 7.1% compared to 6.5 the last time Low glucose readings and not related to activity except possibly before lunch. Discussed reducing basal rates overnight and before lunch to avoid hypoglycemia  Her highest blood sugars are after breakfast and she should get a higher carbohydrate coverage at this time especially with reducing morning basal rates also Blood sugars before supper are generally fairly good with average about 123 and sporadically higher later at night However she is tending to eat little more the evening to prevent nocturnal hypoglycemia, discussed that it is better to avoid hypoglycemia with adjusting basal rate Have recommended continuous  glucose monitoring to her and she will look into this, discussed in detail how this works and recommended [employer], brochure given NEW basal rate: Midnight = 0.45, 5 AM = 0.9, 10 AM = 1.1. Carbohydrate ratio 1:8 at breakfast and 1:10 for lunch and supper  HYPERTENSION: Well controlled on Benicar  SYNCOPE: This does not appear to  be related to hypoglycemia or orthostatic hypotension, discussed that she should followup with her PCP as soon as possible  HYPERLIPIDEMIA: Her LDL is still high at 155 and she will resume her pravastatin which she ran out of  Counseling time over 50% of today's 25 minute visit  Franchot Pollitt 06/25/2013, 9:42 AM

## 2013-06-25 NOTE — Patient Instructions (Addendum)
Change setting as diected, Call Dexcom for continuous glucose monitoring

## 2013-08-06 ENCOUNTER — Ambulatory Visit (INDEPENDENT_AMBULATORY_CARE_PROVIDER_SITE_OTHER): Payer: Medicare Other | Admitting: Endocrinology

## 2013-08-06 ENCOUNTER — Other Ambulatory Visit: Payer: Self-pay | Admitting: *Deleted

## 2013-08-06 ENCOUNTER — Encounter: Payer: Self-pay | Admitting: Endocrinology

## 2013-08-06 VITALS — BP 158/84 | HR 65 | Temp 98.2°F | Resp 12 | Ht 64.0 in | Wt 139.4 lb

## 2013-08-06 DIAGNOSIS — E119 Type 2 diabetes mellitus without complications: Secondary | ICD-10-CM

## 2013-08-06 DIAGNOSIS — I10 Essential (primary) hypertension: Secondary | ICD-10-CM

## 2013-08-06 DIAGNOSIS — E785 Hyperlipidemia, unspecified: Secondary | ICD-10-CM

## 2013-08-06 LAB — LIPID PANEL
Cholesterol: 226 mg/dL — ABNORMAL HIGH (ref 0–200)
Total CHOL/HDL Ratio: 3
Triglycerides: 32 mg/dL (ref 0.0–149.0)
VLDL: 6.4 mg/dL (ref 0.0–40.0)

## 2013-08-06 LAB — COMPREHENSIVE METABOLIC PANEL
Albumin: 3.7 g/dL (ref 3.5–5.2)
Alkaline Phosphatase: 63 U/L (ref 39–117)
BUN: 8 mg/dL (ref 6–23)
Creatinine, Ser: 0.8 mg/dL (ref 0.4–1.2)
Glucose, Bld: 217 mg/dL — ABNORMAL HIGH (ref 70–99)
Total Bilirubin: 0.7 mg/dL (ref 0.3–1.2)

## 2013-08-06 LAB — MICROALBUMIN / CREATININE URINE RATIO
Creatinine,U: 39.4 mg/dL
Microalb, Ur: 0.1 mg/dL (ref 0.0–1.9)

## 2013-08-06 LAB — URINALYSIS
Leukocytes, UA: NEGATIVE
Nitrite: NEGATIVE
Specific Gravity, Urine: 1.01 (ref 1.000–1.030)
Urobilinogen, UA: 0.2 (ref 0.0–1.0)

## 2013-08-06 NOTE — Patient Instructions (Addendum)
Use Temp Basal for upto 6 hrs for exercise  Dexcom? pharmacy benefit  Bolus with every meal and snack  Check BP at home weekly

## 2013-08-06 NOTE — Progress Notes (Signed)
Patient ID: Gina [name], female   DOB: March 26, 1946, 67 y.o.   MRN: [id number]  Gina [name] is an 67 y.o. female.   Reason for Appointment: Insulin Pump followup:   History of Present Illness   Diagnosis: Type 1 DIABETES MELITUS, date of diagnosis:  1984     CURRENT insulin pump:  One Touch Ping, has been on a pump since 1995  HISTORY: She has had overall fair control of her diabetes with insulin pump and A1c usually under 7%.  Current blood sugar patterns and problems identified:  Frequent hypoglycemia but this is somewhat inconsistent, most often late in the evening but occasionally on waking up an early afternoon  Hypoglycemia may be at times caused by increased physical activity and her not using the temporary basal rate for this but rather waiting for the blood sugar to be low to treat it  Recently not waking up with low blood sugars  Occasional hypoglycemia may be following a mealtime bolus, possibly a post prandial bolus  Some of her relatively high readings are from either missing a bolus at mealtimes or taking it too late  Frequently overriding the pump and taking less insulin for fear of hypoglycemia; usually the post bolus that sugars are fairly good but occasionally over 250 or low  Rarely may have infusion set issues causing hyperglycemia as an 10/19  BOLUSES are infrequent and does not appear to be doing correction boluses every time her blood sugar is high and sometimes has no boluses around mealtimes possibly when she is eating less carbohydrate  The pump SETTINGS are: Midnight = 0.45, 5 AM = 0.9, 10 AM = 1.1, 12 noon = 1.0, 4 PM = 0.8 and 9 PM = 0.75. Carbohydrate ratio 1:8 at breakfast and 1:10 for lunch and supper. Blood sugar target 110  GLUCOSE CONTROL with the pump is assessed today by pump download. Recent fasting blood sugars: 75-180 but has been as low as 51 and as high as 313 Recent readings midday 86-251, afternoon 63-288, late evening 39-323 Overall  AVERAGE 154 with MEAN blood sugars as follows: Mornings about 150, midday 105, early afternoon 172, evenings about 165, showing significant fluctuation at all different times especially around 8-10 PM Pump management: She is bolusing 2.6 times a day, average carbohydrate intake 27 g daily  HYPOGLYCEMIC episodes are  Overnight, 6-8 am and 11-1  Previous A1c 6.5 done in 6/14 and on 06/16/13 was 7.1%  EXERCISE: Gardening  LABS:  Lab Results  Component Value Date   HGBA1C 7.4* 08/06/2013   Lab Results  Component Value Date   MICROALBUR 0.1 08/06/2013   CREATININE 0.8 08/06/2013       Medication List       This list is accurate as of: 08/06/13  9:18 AM.  Always use your most recent med list.               alendronate 70 MG tablet  Commonly known as:  FOSAMAX  Take 70 mg by mouth every 7 (seven) days. Take with a full glass of water on an empty stomach.     aspirin 81 MG tablet  Take 81 mg by mouth daily.     calcium carbonate 500 MG chewable tablet  Commonly known as:  TUMS - dosed in mg elemental calcium  Chew 1 tablet by mouth daily.     co-enzyme Q-10 50 MG capsule  Take 400 mg by mouth daily.     Cod Liver Oil 1000 MG Caps  Take by mouth.     glucosamine-chondroitin 500-400 MG tablet  Take 1 tablet by mouth daily.     glucose 4 GM chewable tablet  Chew 16 g by mouth as needed.     insulin lispro 100 UNIT/ML injection  Commonly known as:  HUMALOG  Inject into the skin. 23.25 units basal 1:C ratio 1U to 10 Carbs     MSM PO  Take 2,000 each by mouth daily.     naproxen sodium 220 MG tablet  Commonly known as:  ANAPROX  Take 220 mg by mouth as needed.     NON FORMULARY  Take 600 mg by mouth daily. Tumeric Extract     olmesartan 20 MG tablet  Commonly known as:  BENICAR  Take 10 mg by mouth daily.     pravastatin 40 MG tablet  Commonly known as:  PRAVACHOL  Take 40 mg by mouth daily.     vitamin A 16109 UNIT capsule  Take 10,000 Units by mouth  daily.     vitamin C 1000 MG tablet  Take 1,000 mg by mouth daily.     Vitamin D-3 5000 UNITS Tabs  Take 5,000 Units by mouth daily.        Allergies: No Known Allergies  Past Medical History  Diagnosis Date  . Ovarian cyst   . Hypertension   . Insulin pump in place   . 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 Diabetes mellitus     Insulin pump  . Anxiety     new dx  . Hyperlipidemia   . S/P radiation therapy 04/10/12- 05/26/2012    left Breast and Axilla / 50 gy / 25 Fractions with Left Breast Boost / 10 Gy / 5 Fractions  . Use of letrozole (Femara) 06/13/12    Past Surgical History  Procedure Laterality Date  . Tonsillectomy    . Ovarian cyst surgery    . Colonoscopy      1 polyp   . 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 Appendectomy      Family History  Problem Relation Age of Onset  . Breast cancer Mother 78  . Prostate cancer Maternal Uncle     died in his 40s    Social History:  reports that she has never smoked. She does not have any smokeless tobacco history on file. She reports that she does not drink alcohol or use illicit drugs.  REVIEW of systems:  Syncope recently, at lunch and has had this 2-3x since June. Not associated with hypoglycemia and recovers spontaneously although features of seizure  HYPERCHOLESTEROLEMIA: Her LDL has been persistently high and is supposed to be taking pravastatin   EXAM:  BP 158/84  Pulse 65  Temp(Src) 98.2 F (36.8 C)  Resp 12  Ht 5\' 4"  (1.626 m)  Wt 139 lb 6.4 oz (63.231 kg)  BMI 23.92 kg/m2  SpO2 99%  Repeat BP 146/80  ASSESSMENT:  DIABETES: Her blood sugars are continuing to be labile but no consistent pattern See history of present illness for detailed  discussion of current blood sugar levels, abnormal patterns, problems identified Discussed day-to-day management of her insulin in the following physicians were made  Use a temporary basal of around 50% for increased activity levels like  farming and may continue this for up to 6 hours to prevent delayed hypoglycemia  Bolus for every meal and snack while eating  Bolus for all high sugars  Checking to be continuous glucose monitoring again and see if it is covered under pharmacy benefit  Call if having low blood sugars on waking up or overnight  Will need to consider changing her correction factor or carbohydrate coverage since he is overriding the pump frequently. Currently we'll leave her settings the same  Continue present basal rates: Midnight = 0.45, 5 AM = 0.9, 10 AM = 1.1, 12 noon = 1.0, 4 PM = 0.8 and 9 PM = 0.75. Carbohydrate ratio 1:8 at breakfast and 1:10 for lunch and supper. Blood sugar target 110  HYPERTENSION: Usually well controlled on Benicar 20 mg but higher today  HYPERLIPIDEMIA: Her LDL is 148, discussed need to take pravastatin regularly  Counseling time over 50% of today's 25 minute visit  Arjay Jaskiewicz 08/06/2013, 9:18 AM

## 2013-08-07 ENCOUNTER — Telehealth: Payer: Self-pay | Admitting: Endocrinology

## 2013-08-07 DIAGNOSIS — E785 Hyperlipidemia, unspecified: Secondary | ICD-10-CM | POA: Insufficient documentation

## 2013-08-07 NOTE — Telephone Encounter (Signed)
Patient notified of results. Patient wants to know what the partial level is for her cholesterol before she makes any medication changes?

## 2013-08-07 NOTE — Telephone Encounter (Signed)
Cholesterol was significantly high on her visit, need to know if she has been taking her pravastatin recently, if so may need to consider changing the medication

## 2013-08-07 NOTE — Telephone Encounter (Signed)
LDL or bad cholesterol is 409, should be under 100 We can change her pravastatin to either generic Lipitor 40 mg daily or Crestor 20 mg daily

## 2013-08-10 NOTE — Telephone Encounter (Signed)
Patient given information. Patient stated that she will continue taking the pravastatin for now. Patient became upset when I told her I only see pravastatin on her med list. Patient wants to know where her records are from [place]. Patient is unreasonable about discussing her lab results and medication changes. Patient requested that I ask Bjorn Loser where patient's records are. Please advise.

## 2013-08-10 NOTE — Telephone Encounter (Signed)
Patient was given lab results, she said that she wants to stay on the pravastatin, she said her LDL is lower than last time and even though it's coming down slow, it's coming down and she does not want to make any changes at this time.

## 2013-08-10 NOTE — Telephone Encounter (Signed)
Please explain to the patient that LDL or bad cholesterol is 161, should be under 100  We can change her pravastatin to either generic Lipitor 40 mg daily or brand name Crestor 20 mg daily  Need to sort out about her records

## 2013-09-22 ENCOUNTER — Other Ambulatory Visit: Payer: Self-pay | Admitting: *Deleted

## 2013-09-22 MED ORDER — OLMESARTAN MEDOXOMIL 20 MG PO TABS: 20.0000 mg | ORAL_TABLET | Freq: Every day | ORAL | Status: DC

## 2013-09-24 ENCOUNTER — Ambulatory Visit: Payer: Medicare Other | Admitting: Endocrinology

## 2013-11-03 ENCOUNTER — Other Ambulatory Visit: Payer: Self-pay | Admitting: *Deleted

## 2013-11-03 ENCOUNTER — Other Ambulatory Visit: Payer: Medicare Other

## 2013-11-05 ENCOUNTER — Ambulatory Visit: Payer: Medicare Other | Admitting: Endocrinology

## 2013-11-30 ENCOUNTER — Other Ambulatory Visit (INDEPENDENT_AMBULATORY_CARE_PROVIDER_SITE_OTHER): Payer: Medicare Other

## 2013-11-30 DIAGNOSIS — E119 Type 2 diabetes mellitus without complications: Secondary | ICD-10-CM

## 2013-11-30 LAB — URINALYSIS
BILIRUBIN URINE: NEGATIVE
Hgb urine dipstick: NEGATIVE
KETONES UR: NEGATIVE
Leukocytes, UA: NEGATIVE
Nitrite: NEGATIVE
PH: 7 (ref 5.0–8.0)
SPECIFIC GRAVITY, URINE: 1.01 (ref 1.000–1.030)
Total Protein, Urine: NEGATIVE
URINE GLUCOSE: NEGATIVE
Urobilinogen, UA: 0.2 (ref 0.0–1.0)

## 2013-11-30 LAB — COMPREHENSIVE METABOLIC PANEL
ALT: 26 U/L (ref 0–35)
AST: 30 U/L (ref 0–37)
Albumin: 3.5 g/dL (ref 3.5–5.2)
Alkaline Phosphatase: 67 U/L (ref 39–117)
BILIRUBIN TOTAL: 0.6 mg/dL (ref 0.3–1.2)
BUN: 14 mg/dL (ref 6–23)
CALCIUM: 8.8 mg/dL (ref 8.4–10.5)
CHLORIDE: 98 meq/L (ref 96–112)
CO2: 29 meq/L (ref 19–32)
CREATININE: 0.7 mg/dL (ref 0.4–1.2)
GFR: 88.07 mL/min (ref >60.00)
Glucose, Bld: 105 mg/dL — ABNORMAL HIGH (ref 70–99)
Potassium: 4.2 mEq/L (ref 3.5–5.1)
SODIUM: 133 meq/L — AB (ref 135–145)
TOTAL PROTEIN: 6.2 g/dL (ref 6.0–8.3)

## 2013-11-30 LAB — MICROALBUMIN / CREATININE URINE RATIO
CREATININE, U: 39.8 mg/dL
MICROALB/CREAT RATIO: 0.5 mg/g (ref 0.0–30.0)
Microalb, Ur: 0.2 mg/dL (ref 0.0–1.9)

## 2013-11-30 LAB — HEMOGLOBIN A1C: HEMOGLOBIN A1C: 7.9 % — AB (ref 4.6–6.5)

## 2013-12-02 ENCOUNTER — Ambulatory Visit: Payer: Medicare Other | Admitting: Endocrinology

## 2013-12-16 ENCOUNTER — Encounter: Payer: Self-pay | Admitting: Endocrinology

## 2013-12-16 ENCOUNTER — Ambulatory Visit (INDEPENDENT_AMBULATORY_CARE_PROVIDER_SITE_OTHER): Payer: Medicare Other | Admitting: Endocrinology

## 2013-12-16 VITALS — BP 132/62 | HR 73 | Temp 97.7°F | Resp 14 | Ht 63.0 in | Wt 142.6 lb

## 2013-12-16 DIAGNOSIS — E063 Autoimmune thyroiditis: Secondary | ICD-10-CM

## 2013-12-16 DIAGNOSIS — I10 Essential (primary) hypertension: Secondary | ICD-10-CM

## 2013-12-16 DIAGNOSIS — IMO0002 Reserved for concepts with insufficient information to code with codable children: Secondary | ICD-10-CM

## 2013-12-16 DIAGNOSIS — E785 Hyperlipidemia, unspecified: Secondary | ICD-10-CM

## 2013-12-16 DIAGNOSIS — E1065 Type 1 diabetes mellitus with hyperglycemia: Secondary | ICD-10-CM

## 2013-12-16 DIAGNOSIS — E038 Other specified hypothyroidism: Secondary | ICD-10-CM

## 2013-12-16 NOTE — Patient Instructions (Signed)
Bolus with every meal and snack even if low  Take Pravastatin

## 2013-12-16 NOTE — Progress Notes (Signed)
Patient ID: Gina [name], female   DOB: 07-12-1946, 68 y.o.   MRN: [id number]   Reason for Appointment: Insulin Pump followup:   History of Present Illness   Diagnosis: Type 1 DIABETES MELITUS, date of diagnosis:  1984     CURRENT insulin pump:  One Touch Ping, has been on a pump since 1995  HISTORY: She has had overall fair control of her diabetes with insulin pump  Previous A1c 6.5  in 6/14 and on 06/16/13 was 7.1% Subsequently A1c appears to be relatively higher However she does not appear to have any consistent trend in her blood sugars with variable readings at all times  Current blood sugar patterns and problems identified:  Blood sugars are relatively good on waking up with occasional low sugars, only once below 60  Blood sugars are variable there are lunchtime and midafternoon  Has some tendency to low sugars between 1 PM-3 PM, not many recently  Has several high readings around 4 PM but again not consistent.  Blood sugars after supper late at night or extremely variable with some very high readings, possibly from missing her evening boluses  She thinks she may not bolus at every meal when she is eating only small amount of carbohydrate  The pump SETTINGS are: Midnight = 0.45, 5 AM = 0.9, 10 AM = 1.1, 12 noon = 1.0, 4 PM = 0.8 and 9 PM = 0.75. Carbohydrate ratio 1:8 at breakfast and 1:10 for lunch and supper. Blood sugar target 110  GLUCOSE CONTROL with the pump is assessed today by pump download.  She has 16% of readings below 70 and 50% of readings above 140  PREMEAL Breakfast Lunch Dinner Bedtime Overall  Glucose range:  55-196   55-277   85-181   60-443    Mean/median:  136   166   109   189   134    POST-MEAL PC Breakfast PC Lunch PC Dinner  Glucose range:   59-263    Mean/median:   160     Pump management: She is bolusing 3.8 times a day, average carbohydrate intake 29 g daily  HYPOGLYCEMIC episodes are  occurring at all different times and overall most  frequently after 6 PM but sporadically he also around breakfast and lunch  EXERCISE: None recently  Wt Readings from Last 3 Encounters:  12/16/13 142 lb 9.6 oz (64.683 kg)  08/06/13 139 lb 6.4 oz (63.231 kg)  06/25/13 138 lb 6.4 oz (62.778 kg)     LABS:  Lab Results  Component Value Date   HGBA1C 7.9* 11/30/2013   HGBA1C 7.4* 08/06/2013   Lab Results  Component Value Date   MICROALBUR 0.2 11/30/2013   CREATININE 0.7 11/30/2013       Medication List       This list is accurate as of: 12/16/13  2:01 PM.  Always use your most recent med list.               alendronate 70 MG tablet  Commonly known as:  FOSAMAX  Take 70 mg by mouth every 7 (seven) days. Take with a full glass of water on an empty stomach.     aspirin 81 MG tablet  Take 81 mg by mouth daily.     Betaine HCl 300 MG Tabs  Take by mouth every 3 (three) days.     calcium carbonate 500 MG chewable tablet  Commonly known as:  TUMS - dosed in mg elemental calcium  Chew 1  tablet by mouth daily.     co-enzyme Q-10 50 MG capsule  Take 200 mg by mouth daily.     Cod Liver Oil 1000 MG Caps  Take by mouth. 4 tablets daily     cyanocobalamin 1000 MCG tablet  Take 100 mcg by mouth daily.     glucosamine-chondroitin 500-400 MG tablet  Take 1 tablet by mouth daily.     glucose 4 GM chewable tablet  Chew 16 g by mouth as needed.     insulin lispro 100 UNIT/ML injection  Commonly known as:  HUMALOG  Inject into the skin. 23.25 units basal 1:C ratio 1U to 10 Carbs     MSM PO  Take 2,000 each by mouth daily.     naproxen sodium 220 MG tablet  Commonly known as:  ANAPROX  Take 220 mg by mouth as needed.     olmesartan 20 MG tablet  Commonly known as:  BENICAR  Take 1 tablet (20 mg total) by mouth daily.     pravastatin 40 MG tablet  Commonly known as:  PRAVACHOL  Take 40 mg by mouth daily.     PROGESTERONE (VAGINAL) 4 % Gel  Place vaginally.     thyroid 30 MG tablet  Commonly known as:  ARMOUR   Take 30 mg by mouth daily before breakfast. Takes 1 tablet every other day and 2 tablets alternating days     vitamin C 1000 MG tablet  Take 6,000 mg by mouth daily.     Vitamin D-3 5000 UNITS Tabs  Take 5,000 Units by mouth every other day.        Allergies: No Known Allergies  Past Medical History  Diagnosis Date  . Ovarian cyst   . Hypertension   . Insulin pump in place   . 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 Diabetes mellitus     Insulin pump  . Anxiety     new dx  . Hyperlipidemia   . S/P radiation therapy 04/10/12- 05/26/2012    left Breast and Axilla / 50 gy / 25 Fractions with Left Breast Boost / 10 Gy / 5 Fractions  . Use of letrozole (Femara) 06/13/12    Past Surgical History  Procedure Laterality Date  . Tonsillectomy    . Ovarian cyst surgery    . Colonoscopy      1 polyp   . 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 Appendectomy      Family History  Problem Relation Age of Onset  . Breast cancer Mother 12  . Prostate cancer Maternal Uncle     died in his 65s    Social History:  reports that she has never smoked. She does not have any smokeless tobacco history on file. She reports that she does not drink alcohol or use illicit drugs.  REVIEW of systems:   She has a history of mild hypothyroidism followed by PCP   No results found for this basename: TSH    HYPERCHOLESTEROLEMIA: Her LDL has been persistently high and is supposed to be taking pravastatin. Again has refused to do this since she thinks that it will cause side effects and is not going to improve her lifespan Also had recent lipoprotein analysis done with particle number of  1157 and borderline LDL of 116 from outside lab  Lab Results  Component  Value Date   CHOL 226* 08/06/2013   HDL 75.60 08/06/2013   LDLDIRECT 148.4 08/06/2013   TRIG 32.0 08/06/2013   CHOLHDL 3 08/06/2013     EXAM:  BP 132/62  Pulse 73  Temp(Src) 97.7 F (36.5 C)  Resp 14  Ht $R'5\' 3"'xa$  (1.6 m)  Wt 142 lb 9.6 oz (64.683 kg)  BMI 25.27 kg/m2  SpO2 99%   ASSESSMENT:  DIABETES: Her blood sugars are continuing to be labile  with no consistent pattern At any given time  A1c is relatively higher at 7.9% even though recent average glucose is 143 See history of present illness for detailed discussion of current blood sugar levels, abnormal patterns, problems identified Discussed needing to bolus for every meal with carbohydrate and she may not be doing this consistently in the evenings; may consider increasing her carbohydrate ratio at dinnertime if she has persistently high postprandial readings She is also good candidate for continuous glucose monitoring but  is concerned about the cost  HYPERTENSION: Usually well controlled on Benicar 20 mg   HYPERLIPIDEMIA: Again discussed in detail the benefits of statin drugs in patients with diabetes. Encouraged her to start taking statin drugs as recommended by the American diabetes Association recently. Also emphasized that she does have a LDL particle number which is above recommended range of 1000 or less  Counseling time over 50% of today's 25 minute visit  Sander Remedios 12/16/2013, 2:01 PM

## 2014-01-29 ENCOUNTER — Ambulatory Visit: Payer: Medicare Other | Admitting: Radiation Oncology

## 2014-02-12 ENCOUNTER — Other Ambulatory Visit: Payer: Self-pay

## 2014-02-12 MED ORDER — INSULIN LISPRO 100 UNIT/ML ~~LOC~~ SOLN: SUBCUTANEOUS | Status: DC

## 2014-02-19 ENCOUNTER — Ambulatory Visit
Admission: RE | Admit: 2014-02-19 | Discharge: 2014-02-19 | Disposition: A | Discharge status: Home / Self Care | Payer: Medicare Other | Source: Ambulatory Visit | Attending: Radiation Oncology | Admitting: Radiation Oncology

## 2014-02-19 ENCOUNTER — Encounter: Payer: Self-pay | Admitting: Radiation Oncology

## 2014-02-19 VITALS — BP 123/73 | HR 71 | Temp 97.7°F | Wt 146.7 lb

## 2014-02-19 DIAGNOSIS — C50419 Malignant neoplasm of upper-outer quadrant of unspecified female breast: Secondary | ICD-10-CM

## 2014-02-19 NOTE — Progress Notes (Signed)
Ms/ Gina [name] here for fu s/p radiation to her left breast.  She denies ay pain or concerns regarding her left breast, and states intermittent fatigue.  She is taking Osteo-strong and she feels much better.

## 2014-02-19 NOTE — Progress Notes (Signed)
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  She had a  BIRADS 2 Mammogram, [facility], 02-05-14.     She started progesterone vaginal cream approximately 1 to 1.5 yrs ago through [facility].  She is gardening with her husband. No complaints.               ALLERGIES:  has No Known Allergies.  Meds: Current Outpatient Prescriptions  Medication Sig Dispense Refill  . aspirin 81 MG tablet Take 81 mg by mouth daily.      . Betaine HCl 300 MG TABS Take by mouth every 3 (three) days.      . calcium carbonate (TUMS - DOSED IN MG ELEMENTAL CALCIUM) 500 MG chewable tablet Chew 1 tablet by mouth daily.      . Cholecalciferol (VITAMIN D-3) 5000 UNITS TABS Take 5,000 Units by mouth every other day.       Marland Kitchen co-enzyme Q-10 50 MG capsule Take 200 mg by mouth daily.       Marland Kitchen Cod Liver Oil 1000 MG CAPS Take by mouth. 4 tablets daily      . cyanocobalamin 1000 MCG tablet Take 100 mcg by mouth daily.      Marland Kitchen glucosamine-chondroitin 500-400 MG tablet Take 1 tablet by mouth daily.       Marland Kitchen glucose 4 GM chewable tablet Chew 16 g by mouth as needed.      . insulin lispro (HUMALOG) 100 UNIT/ML injection Basal rate 0.9 7am-12, 0.8 Noon-7am. Max of 60 units SQ every day with pump mer MD  20 mL  6  . Methylsulfonylmethane (MSM PO) Take 2,000 each by mouth daily.      Marland Kitchen olmesartan (BENICAR) 20 MG tablet  Take 1 tablet (20 mg total) by mouth daily.  30 tablet  6  . PROGESTERONE, VAGINAL, 4 % GEL Place vaginally.      . Red Yeast Rice 600 MG TABS Take 1 tablet by mouth 4 (four) times daily.      Marland Kitchen thyroid (ARMOUR) 30 MG tablet Take 30 mg by mouth daily before breakfast. Takes 1 tablet every other day and 2 tablets alternating days       No current facility-administered medications for this encounter.   Facility-Administered Medications Ordered in Other Encounters  Medication Dose Route Frequency Provider Last Rate Last Dose  . topical emolient (BIAFINE) emulsion   Topical Daily Lonie Peak, MD        Physical Findings: The patient is in no acute distress. Patient is alert and oriented.  weight is 146 lb 11.2 oz (66.543 kg). Her temperature is 97.7 F (36.5 C). Her blood pressure is 123/73 and her pulse is 71. Peri [place] Range of motion in her arms. Both breasts demonstrate no palpable lesions of concern. Underneath her left lumpectomy scar there is a little bit of firmness consistent  with scar tissue from surgery and radiation. No axillary adenopathy bilaterally. No cervical or supraclavicular adenopathy bilaterally. No lymphedema in her arms.   Lab Findings: Lab Results  Component Value Date   WBC 3.1* 01/30/2012   HGB 13.5 03/05/2012   HCT 39.5 01/30/2012   MCV 90.4 01/30/2012   PLT 172 01/30/2012    Radiographic Findings: BIRADS 2 Mammogram, [facility], 02-05-14  Impression/Plan:  Doing well with no evidence of disease. She has declined an antiestrogen pill as described in previous documentation. With a node positive ER/PR positive breast cancer, I would not recommend that she take progesterone vaginal cream. I informed her of the risks of this. I sent him an in basket note to providers in medical oncology to see if there are any alternative creams if they would recommend for patients with her prior diagnosis and risk factors. She is not interested in a followup appointment with them at this  time.  I will see her back for 1 yr f/u.  _____________________________________   Eppie Gibson, MD

## 2014-02-22 ENCOUNTER — Telehealth: Payer: Self-pay | Admitting: *Deleted

## 2014-02-22 NOTE — Telephone Encounter (Signed)
CALLED PATIENT TO INFORM OF MAMMO. SCHEDULED FOR 02-24-15- ARRIVAL TIME - 12:45 PM @ [facility], LVM FOR A RETURN CALL

## 2014-02-23 ENCOUNTER — Telehealth: Payer: Self-pay | Admitting: Adult Health

## 2014-02-23 NOTE — Telephone Encounter (Signed)
Attempted to call patient to discuss vaginal dryness issue she has been having.  LVOM.

## 2014-03-02 ENCOUNTER — Ambulatory Visit: Payer: Medicare Other | Admitting: Radiation Oncology

## 2014-03-18 ENCOUNTER — Ambulatory Visit: Payer: Medicare Other | Admitting: Endocrinology

## 2014-03-18 ENCOUNTER — Other Ambulatory Visit: Payer: Medicare Other

## 2014-03-19 ENCOUNTER — Ambulatory Visit: Payer: Medicare Other | Admitting: Endocrinology

## 2014-03-19 ENCOUNTER — Other Ambulatory Visit: Payer: Medicare Other

## 2014-03-22 ENCOUNTER — Other Ambulatory Visit: Payer: Medicare Other

## 2014-03-22 ENCOUNTER — Ambulatory Visit: Payer: Medicare Other | Admitting: Endocrinology

## 2014-04-05 ENCOUNTER — Other Ambulatory Visit: Payer: Medicare Other

## 2014-04-05 ENCOUNTER — Encounter: Payer: Self-pay | Admitting: Endocrinology

## 2014-04-05 ENCOUNTER — Ambulatory Visit (INDEPENDENT_AMBULATORY_CARE_PROVIDER_SITE_OTHER): Payer: Medicare Other | Admitting: Endocrinology

## 2014-04-05 VITALS — BP 130/70 | HR 68 | Temp 97.9°F | Resp 14 | Ht 63.0 in | Wt 139.6 lb

## 2014-04-05 DIAGNOSIS — E063 Autoimmune thyroiditis: Secondary | ICD-10-CM

## 2014-04-05 DIAGNOSIS — E038 Other specified hypothyroidism: Secondary | ICD-10-CM

## 2014-04-05 DIAGNOSIS — IMO0002 Reserved for concepts with insufficient information to code with codable children: Secondary | ICD-10-CM

## 2014-04-05 DIAGNOSIS — I10 Essential (primary) hypertension: Secondary | ICD-10-CM

## 2014-04-05 DIAGNOSIS — E785 Hyperlipidemia, unspecified: Secondary | ICD-10-CM

## 2014-04-05 DIAGNOSIS — E1065 Type 1 diabetes mellitus with hyperglycemia: Secondary | ICD-10-CM

## 2014-04-05 NOTE — Patient Instructions (Signed)
Bolus at lunch whenever getting any Carbs

## 2014-04-05 NOTE — Progress Notes (Signed)
Patient ID: Gina [name], female   DOB: 1946/01/25, 68 y.o.   MRN: [id number]   Reason for Appointment: Insulin Pump followup:   History of Present Illness   Diagnosis: Type 1 DIABETES MELITUS, date of diagnosis:  1984     CURRENT insulin pump:  One Touch Ping, has been on a pump since 1995  HISTORY:  Gina [name] has had overall fair control of Gina [name] diabetes with insulin pump  Previous A1c 6.5  in 6/14 and on 06/16/13 was 7.1% Subsequently A1c appears to be relatively higher and now over 7% consistently However Gina [name] has variable readings at all times  Current blood sugar patterns and problems identified:  Blood sugars are relatively good on waking up with occasional  high readings  Blood sugars may be higher after breakfast at times  Blood sugars are mostly significantly high in the afternoon  Blood sugars before supper are variable but sometimes high also  Not checking enough readings after meals to identify patterns  Usually blood sugars after breakfast or fairly good but sporadically higher  Minimal bolusing at lunchtime  No recent hypoglycemia compared to previous visits  Overall average glucose is higher recently  Not entering carbohydrates for Gina [name] boluses consistently even though Gina [name] claims Gina [name] is eating low-carb meals Gina [name] thinks Gina [name] is getting cardiograms at lunchtime  The pump SETTINGS are: Midnight = 0.45, 5 AM = 0.9, 10 AM = 1.1, 12 noon = 1.0, 4 PM = 0.8 and 9 PM = 0.75. Carbohydrate ratio 1:8 at breakfast and 1:10 for lunch and supper. Blood sugar target 110  GLUCOSE CONTROL with the pump is assessed today by pump download.  Gina [name] has 0% of readings below 70 and 53% of readings above 140  PREMEAL Breakfast Lunch Dinner Bedtime Overall  Glucose range:  86-209   112-286   113-375   146, 161    Mean/median:  159    196    188    POST-MEAL PC Breakfast PC Lunch PC Dinner  Glucose range:  100-312   198-300    Mean/median:        PREMEAL Breakfast Lunch Dinner Bedtime  Overall  Glucose range:  55-196   55-277   85-181   60-443    Mean/median:  136   166   109   189   134    POST-MEAL PC Breakfast PC Lunch PC Dinner  Glucose range:   59-263    Mean/median:   160     Pump management: Gina [name] is bolusing 3.8 times a day, average carbohydrate intake 29 g daily  HYPOGLYCEMIC episodes are  occurring at all different times and overall most frequently after 6 PM but sporadically he also around breakfast and lunch  EXERCISE: Irregular recently, walking 30 min  Wt Readings from Last 3 Encounters:  04/05/14 139 lb 9.6 oz (63.322 kg)  02/19/14 146 lb 11.2 oz (66.543 kg)  12/16/13 142 lb 9.6 oz (64.683 kg)    LABS:   A1c 7.6 in 5/15  Lab Results  Component Value Date   HGBA1C 7.9* 11/30/2013   HGBA1C 7.4* 08/06/2013   Lab Results  Component Value Date   MICROALBUR 0.2 11/30/2013   CREATININE 0.7 11/30/2013       Medication List       This list is accurate as of: 04/05/14 11:43 AM.  Always use your most recent med list.               aspirin 81 MG tablet  Take 81 mg by mouth daily.     Betaine HCl 300 MG Tabs  Take 600 mg by mouth every 3 (three) days.     calcium carbonate 500 MG chewable tablet  Commonly known as:  TUMS - dosed in mg elemental calcium  Chew 1 tablet by mouth daily.     co-enzyme Q-10 50 MG capsule  Take 200 mg by mouth daily.     Cod Liver Oil 1000 MG Caps  Take by mouth. 4 tablets daily     cyanocobalamin 1000 MCG tablet  Take 100 mcg by mouth daily.     glucosamine-chondroitin 500-400 MG tablet  Take 1 tablet by mouth daily.     glucose 4 GM chewable tablet  Chew 16 g by mouth as needed.     insulin lispro 100 UNIT/ML injection  Commonly known as:  HUMALOG  Basal rate 0.9 7am-12, 0.8 Noon-7am. Max of 60 units SQ every day with pump mer MD     MAGNESIUM GLUCONATE PO  Take 400 mg by mouth.     MSM PO  Take 2,000 each by mouth daily.     NALTREXONE HCL PO  Take 4.5 mg by mouth.     olmesartan 20 MG  tablet  Commonly known as:  BENICAR  Take 1 tablet (20 mg total) by mouth daily.     PROGESTERONE (VAGINAL) 4 % Gel  Place vaginally.     Red Yeast Rice 600 MG Tabs  Take 1 tablet by mouth 4 (four) times daily.     thyroid 30 MG tablet  Commonly known as:  ARMOUR  Take 30 mg by mouth daily before breakfast. Takes 1 tablet every other day and 2 tablets alternating days     vitamin C 1000 MG tablet  Take 8,000 mg by mouth daily.     Vitamin D-3 5000 UNITS Tabs  Take 5,000 Units by mouth every other day.        Allergies: No Known Allergies  Past Medical History  Diagnosis Date  . Ovarian cyst   . Hypertension   . Insulin pump in place   . 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 Diabetes mellitus     Insulin pump  . Anxiety     new dx  . Hyperlipidemia   . S/P radiation therapy 04/10/12- 05/26/2012    left Breast and Axilla / 50 gy / 25 Fractions with Left Breast Boost / 10 Gy / 5 Fractions  . Use of letrozole (Femara) 06/13/12    Past Surgical History  Procedure Laterality Date  . Tonsillectomy    . Ovarian cyst surgery    . Colonoscopy      1 polyp   . 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 Appendectomy      Family History  Problem Relation Age of Onset  . Breast cancer Mother 46  . Prostate cancer Maternal Uncle     died in his 22s    Social History:  reports that Gina [name] has never smoked. Gina [name] does not have any smokeless tobacco history on file. Gina [name] reports that Gina [name] does not drink alcohol or use illicit drugs.  REVIEW of systems:   Gina [name] has a history of mild hypothyroidism followed by PCP   No  results found for this basename: TSH    HYPERCHOLESTEROLEMIA: Gina [name] LDL has been persistently high and refuses to be taking  pravastatin. Again has refused to do this since Gina [name] thinks that it will cause side effects and is not going to improve Gina [name] lifespan  Also had recent lipoprotein analysis done with particle number of 1258 compared to this level of 1157 and recent LDL of 143  from outside lab  Lab Results  Component Value Date   CHOL 226* 08/06/2013   HDL 75.60 08/06/2013   LDLDIRECT 148.4 08/06/2013   TRIG 32.0 08/06/2013   CHOLHDL 3 08/06/2013     EXAM:  BP 130/70  Pulse 68  Temp(Src) 97.9 F (36.6 C)  Resp 14  Ht $R'5\' 3"'af$  (1.6 m)  Wt 139 lb 9.6 oz (63.322 kg)  BMI 24.74 kg/m2  SpO2 97%   ASSESSMENT:  DIABETES: Gina [name] blood sugars are continuing to be labile but overall high See history of present illness for detailed discussion of current blood sugar levels, abnormal patterns, problems identified Gina [name] is usually not compliant instructions on changing Gina [name] bolusing to cover Gina [name] lunch meal Patients download was shown to Gina [name] and explained that Gina [name] sugars are significantly higher in the afternoon most likely from not covering Gina [name] lunch meal when Gina [name] does probably getting carbohydrate fairly often Recommendations made:  Bolus for lunch consistently  Also occasionally has high readings after breakfast and may need to adjust boluses especially with high fat meals are the issue  Gina [name] needs to check more readings after dinner  Increase basal rate at a.m. to 1.1 and continue until 4 PM  HYPERTENSION:  well controlled on Benicar 20 mg   HYPERLIPIDEMIA: Again Gina [name] is totally against taking statin drugs even though Gina [name] LDL particle number and LDL is above target and Gina [name] will benefit from statin drugs simply because of having diabetes Gina [name] was to continue Gina [name] dietary changes and follow with Gina [name] holistic physician  Hypothyroidism: Advised Gina [name] to get TSH checked when Gina [name] has Gina [name] next lab work since this has not been done for a while  Counseling time over 50% of today's 25 minute  visit  Alejo Beamer 04/05/2014, 11:43 AM

## 2014-04-06 ENCOUNTER — Other Ambulatory Visit: Payer: Self-pay | Admitting: Endocrinology

## 2014-04-23 ENCOUNTER — Other Ambulatory Visit: Payer: Self-pay | Admitting: *Deleted

## 2014-04-23 ENCOUNTER — Telehealth: Payer: Self-pay | Admitting: Endocrinology

## 2014-04-23 DIAGNOSIS — E119 Type 2 diabetes mellitus without complications: Secondary | ICD-10-CM

## 2014-04-23 NOTE — Telephone Encounter (Signed)
Pt's pump is out of warranty and for her to qualify for a new pump she has to have a c peptide test done

## 2014-04-23 NOTE — Telephone Encounter (Signed)
Labs scheduled  

## 2014-04-26 ENCOUNTER — Other Ambulatory Visit (INDEPENDENT_AMBULATORY_CARE_PROVIDER_SITE_OTHER): Payer: Medicare Other

## 2014-04-26 DIAGNOSIS — E1065 Type 1 diabetes mellitus with hyperglycemia: Secondary | ICD-10-CM

## 2014-04-26 DIAGNOSIS — E038 Other specified hypothyroidism: Secondary | ICD-10-CM

## 2014-04-26 DIAGNOSIS — IMO0002 Reserved for concepts with insufficient information to code with codable children: Secondary | ICD-10-CM

## 2014-04-26 DIAGNOSIS — E785 Hyperlipidemia, unspecified: Secondary | ICD-10-CM

## 2014-04-26 DIAGNOSIS — E063 Autoimmune thyroiditis: Secondary | ICD-10-CM

## 2014-04-26 LAB — HEMOGLOBIN A1C: Hgb A1c MFr Bld: 7.4 % — ABNORMAL HIGH (ref 4.6–6.5)

## 2014-04-27 LAB — COMPREHENSIVE METABOLIC PANEL
ALT: 26 U/L (ref 0–35)
AST: 29 U/L (ref 0–37)
Albumin: 3.8 g/dL (ref 3.5–5.2)
Alkaline Phosphatase: 68 U/L (ref 39–117)
BILIRUBIN TOTAL: 0.6 mg/dL (ref 0.2–1.2)
BUN: 8 mg/dL (ref 6–23)
CALCIUM: 8.9 mg/dL (ref 8.4–10.5)
CHLORIDE: 99 meq/L (ref 96–112)
CO2: 29 meq/L (ref 19–32)
CREATININE: 0.7 mg/dL (ref 0.4–1.2)
GFR: 89.44 mL/min (ref >60.00)
GLUCOSE: 96 mg/dL (ref 70–99)
Potassium: 4.2 mEq/L (ref 3.5–5.1)
Sodium: 133 mEq/L — ABNORMAL LOW (ref 135–145)
Total Protein: 6.4 g/dL (ref 6.0–8.3)

## 2014-04-27 LAB — TSH: TSH: 1.66 u[IU]/mL (ref 0.35–4.50)

## 2014-04-27 LAB — T4, FREE: Free T4: 0.8 ng/dL (ref 0.60–1.60)

## 2014-04-27 LAB — LIPID PANEL
CHOLESTEROL: 225 mg/dL — AB (ref 0–200)
HDL: 70 mg/dL (ref >39.00)
LDL Cholesterol: 146 mg/dL — ABNORMAL HIGH (ref 0–99)
NonHDL: 155
TRIGLYCERIDES: 45 mg/dL (ref 0.0–149.0)
Total CHOL/HDL Ratio: 3
VLDL: 9 mg/dL (ref 0.0–40.0)

## 2014-05-02 NOTE — Progress Notes (Signed)
Quick Note:  Please let patient know that the thyroid result is normal and A1c was 7.4 ______

## 2014-05-10 ENCOUNTER — Other Ambulatory Visit: Payer: Self-pay | Admitting: *Deleted

## 2014-05-10 DIAGNOSIS — E119 Type 2 diabetes mellitus without complications: Secondary | ICD-10-CM

## 2014-05-12 ENCOUNTER — Other Ambulatory Visit (INDEPENDENT_AMBULATORY_CARE_PROVIDER_SITE_OTHER): Payer: Medicare Other

## 2014-05-12 DIAGNOSIS — E119 Type 2 diabetes mellitus without complications: Secondary | ICD-10-CM

## 2014-05-12 LAB — GLUCOSE, RANDOM: GLUCOSE: 156 mg/dL — AB (ref 70–99)

## 2014-05-13 LAB — C-PEPTIDE: C-Peptide: <0.1 ng/mL — ABNORMAL LOW (ref 1.1–4.4)

## 2014-05-14 ENCOUNTER — Telehealth: Payer: Self-pay | Admitting: Endocrinology

## 2014-05-14 NOTE — Telephone Encounter (Signed)
Patient Insurance Co requesting her C pep tide test results  In order for patient to get a new pump. Fax information over to [place]

## 2014-07-06 ENCOUNTER — Encounter: Payer: Self-pay | Admitting: Endocrinology

## 2014-07-06 ENCOUNTER — Ambulatory Visit (INDEPENDENT_AMBULATORY_CARE_PROVIDER_SITE_OTHER): Payer: Medicare Other | Admitting: Endocrinology

## 2014-07-06 VITALS — BP 132/67 | HR 65 | Temp 98.0°F | Resp 16 | Ht 63.0 in | Wt 146.4 lb

## 2014-07-06 DIAGNOSIS — E1065 Type 1 diabetes mellitus with hyperglycemia: Secondary | ICD-10-CM

## 2014-07-06 DIAGNOSIS — IMO0002 Reserved for concepts with insufficient information to code with codable children: Secondary | ICD-10-CM

## 2014-07-06 DIAGNOSIS — E785 Hyperlipidemia, unspecified: Secondary | ICD-10-CM

## 2014-07-06 DIAGNOSIS — E039 Hypothyroidism, unspecified: Secondary | ICD-10-CM

## 2014-07-06 NOTE — Progress Notes (Signed)
Patient ID: Gina [name], female   DOB: Feb 25, 1946, 68 y.o.   MRN: [id number]   Reason for Appointment: Insulin Pump followup:   History of Present Illness   Diagnosis: Type 1 DIABETES MELITUS, date of diagnosis:  1984     CURRENT insulin pump:  One Touch Ping, has been on a pump since 1995  HISTORY:  She has had overall fair control of her diabetes with insulin pump  Previous A1c 6.5  in 6/14 and on 06/16/13 was 7.1% Subsequently A1c was relatively higher but recently was down to 6.7 However she has somewhat variable readings at all times  Current blood sugar patterns and problems identified:  Blood sugars are on an average good on waking up with occasional  low normal readings and one high reading after rebound  She has tendency to significant hypoglycemia in the early afternoon and occasionally in the evenings which 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 She mostly forgets to  suspend her pump and did that only once when her glucose was 39  She has periodic high readings in the afternoon generally after her relatively large meals but does not notice with high fat.; She is usually entering her carbohydrates at mealtimes   occasionally will have a low carbohydrate meals at lunchtime  Most of her low sugars are after 6 PM and her high readings are usually around 4 PM but not consistently  When she does correction boluses she may have a blood sugar below 100 subsequently or a low sugar; currently her blood sugar target is 100   She had difficulty adjusting her pump settings especially boluses on her own and did not change her basal rate as directed on the last visit   The pump SETTINGS are: Midnight = 0.45, 5 AM = 0.9, 10 AM = 1.1, 12 noon = 1.0, 4 PM = 0.8 and 9 PM = 0.75. Carbohydrate ratio 1:8.  Blood sugar target 100   GLUCOSE CONTROL with the pump is assessed today by pump download.  She has 0% of readings below 70 and 53% of readings above  140  PREMEAL Breakfast Lunch Dinner Bedtime Overall  Glucose range: 48-209  58-183  32-279   48-218   32-284   Mean/median:  110     107   125+/-57    Pump management: She is bolusing 2.7  times a day, average carbohydrate intake 53  g daily  EXERCISE:  mostly with farming   Wt Readings from Last 3 Encounters:  07/06/14 146 lb 6.4 oz (66.407 kg)  04/05/14 139 lb 9.6 oz (63.322 kg)  02/19/14 146 lb 11.2 oz (66.543 kg)    LABS:   A1c  6.7 on 06/24/14, previously 7.6 in 5/15  Lab Results  Component Value Date   HGBA1C 7.4* 04/26/2014   HGBA1C 7.9* 11/30/2013   HGBA1C 7.4* 08/06/2013   Lab Results  Component Value Date   MICROALBUR 0.2 11/30/2013   LDLCALC 146* 04/26/2014   CREATININE 0.7 04/26/2014       Medication List       This list is accurate as of: 07/06/14 10:26 AM.  Always use your most recent med list.               aspirin 81 MG tablet  Take 81 mg by mouth daily.     BENICAR 20 MG tablet  Generic drug:  olmesartan  TAKE 1 TABLET BY MOUTH EVERY DAY     Betaine HCl  300 MG Tabs  Take 600 mg by mouth every 3 (three) days.     calcium carbonate 500 MG chewable tablet  Commonly known as:  TUMS - dosed in mg elemental calcium  Chew 1 tablet by mouth daily.     co-enzyme Q-10 50 MG capsule  Take 200 mg by mouth daily.     Cod Liver Oil 1000 MG Caps  Take by mouth. 4 tablets daily     cyanocobalamin 1000 MCG tablet  Take 100 mcg by mouth daily.     glucosamine-chondroitin 500-400 MG tablet  Take 1 tablet by mouth daily.     glucose 4 GM chewable tablet  Chew 16 g by mouth as needed.     insulin lispro 100 UNIT/ML injection  Commonly known as:  HUMALOG  Basal rate 0.9 7am-12, 0.8 Noon-7am. Max of 60 units SQ every day with pump mer MD     MAGNESIUM GLUCONATE PO  Take 400 mg by mouth.     MSM PO  Take 2,000 each by mouth daily.     NALTREXONE HCL PO  Take 4.5 mg by mouth.     PROGESTERONE (VAGINAL) 4 % Gel  Place vaginally.     Red Yeast  Rice 600 MG Tabs  Take 1 tablet by mouth 4 (four) times daily.     thyroid 30 MG tablet  Commonly known as:  ARMOUR  Take 30 mg by mouth daily before breakfast. Takes 1 tablet every other day and 2 tablets alternating days     vitamin C 1000 MG tablet  Take 8,000 mg by mouth daily.     Vitamin D-3 5000 UNITS Tabs  Take 5,000 Units by mouth every other day.        Allergies: No Known Allergies  Past Medical History  Diagnosis Date  . Ovarian cyst   . Hypertension   . Insulin pump in place   . 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 Diabetes mellitus     Insulin pump  . Anxiety     new dx  . Hyperlipidemia   . S/P radiation therapy 04/10/12- 05/26/2012    left Breast and Axilla / 50 gy / 25 Fractions with Left Breast Boost / 10 Gy / 5 Fractions  . Use of letrozole (Femara) 06/13/12    Past Surgical History  Procedure Laterality Date  . Tonsillectomy    . Ovarian cyst surgery    . Colonoscopy      1 polyp   . 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 Appendectomy      Family History  Problem Relation Age of Onset  . Breast cancer Mother 39  . Prostate cancer Maternal Uncle     died in his 26s    Social History:  reports that she has never smoked. She does not have any smokeless tobacco history on file. She reports that she does not drink alcohol or use illicit drugs.  REVIEW of systems:   She has a history of mild hypothyroidism followed by PCP   Lab Results  Component Value Date   TSH 1.66 04/26/2014    HYPERCHOLESTEROLEMIA: Her LDL has been persistently high and refuses to be taking pravastatin even though it has had no side effects with her.  She was told by her wellness  physician that she does not meet treatment  because her particle size normal  However her LDL particle number is much higher at 1619 in 9/15  Also had  recent LDL of 179  from outside lab  Lab Results  Component Value Date   CHOL 225* 04/26/2014   HDL 70.00 04/26/2014   LDLCALC 146* 04/26/2014   LDLDIRECT 148.4 08/06/2013   TRIG 45.0 04/26/2014   CHOLHDL 3 04/26/2014     EXAM:  BP 132/67  Pulse 65  Temp(Src) 98 F (36.7 C)  Resp 16  Ht 5' 3" (1.6 m)  Wt 146 lb 6.4 oz (66.407 kg)  BMI 25.94 kg/m2  SpO2 99%   ASSESSMENT:  DIABETES: Her blood sugars are continuing to be labile but overall high See history of present illness for detailed discussion of current blood sugar levels, abnormal patterns, problems identified She is usually not compliant instructions on changing her bolusing to cover her lunch meal Patients download was shown to her and explained that her sugars are significantly higher in the afternoon most likely from not covering her lunch meal when she does probably getting carbohydrate fairly often Recommendations made:  When eating a relatively higher fat meal may add extra 1-2 units bolus.  Consider increasing lunchtime carbohydrate coverage if postprandial readings are consistently high  More blood sugars after dinner to help adjust suppertime bolus  Suspend insulin pump when very active and also may need extra snack  Change sensitivity to 1:30 and target 110 from 100  HYPERTENSION:  well controlled on Benicar 20 mg   HYPERLIPIDEMIA: Again she is totally against taking statin drugs even though her LDL particle number and LDL is much higher and significantly above target. Discussed that she will benefit from statin drugs simply because of having diabetes Guidelines from the American diabetes Association and information on NMR particle analysis given to patient She again wants to  improve her dietary compliance  and follow with her holistic physician  She refuses an influenza vaccine because of fear of  getting heavy metals despite reassurance   Counseling time over 50% of today's 25 minute visit  Mabrey Howland 07/06/2014, 10:26 AM

## 2014-08-16 ENCOUNTER — Encounter: Payer: Self-pay | Admitting: Endocrinology

## 2014-09-27 ENCOUNTER — Ambulatory Visit: Payer: Medicare Other | Admitting: Endocrinology

## 2014-09-29 ENCOUNTER — Ambulatory Visit (INDEPENDENT_AMBULATORY_CARE_PROVIDER_SITE_OTHER): Payer: Medicare Other | Admitting: Endocrinology

## 2014-09-29 ENCOUNTER — Encounter: Payer: Self-pay | Admitting: Endocrinology

## 2014-09-29 VITALS — BP 138/60 | HR 71 | Temp 98.3°F | Resp 14 | Ht 63.0 in | Wt 143.6 lb

## 2014-09-29 DIAGNOSIS — E7800 Pure hypercholesterolemia, unspecified: Secondary | ICD-10-CM

## 2014-09-29 DIAGNOSIS — IMO0002 Reserved for concepts with insufficient information to code with codable children: Secondary | ICD-10-CM

## 2014-09-29 DIAGNOSIS — I10 Essential (primary) hypertension: Secondary | ICD-10-CM

## 2014-09-29 DIAGNOSIS — E1065 Type 1 diabetes mellitus with hyperglycemia: Secondary | ICD-10-CM

## 2014-09-29 NOTE — Progress Notes (Signed)
Patient ID: Gina [name], female   DOB: 1946/06/29, 68 y.o.   MRN: [id number]   Reason for Appointment: Insulin Pump followup:   History of Present Illness   Diagnosis: Type 1 DIABETES MELITUS, date of diagnosis:  1984     CURRENT insulin pump:  One Touch Ping, has been on a pump since 1995  HISTORY:  She has had overall fair control of her diabetes with insulin pump  Previous A1c has been as high as 7.6 but has been below 7 the last 2 times  Current blood sugar patterns and problems identified:  Blood sugars are generally near-normal waking up with an occasional reading that is low normal; only has 2 high readings waking up  She does tend to have relatively higher readings midmorning even if she does not bolus and she is asking about this  Blood sugars are generally lower around lunchtime with low readings on 3 occasions, some after doing a correction bolus  She has tendency to lower readings around 4-6 PM especially in the last few days; however still has periodic high readings in the early afternoon  Most of her readings after her evening meal are fairly good with only occasional high or low readings  Most of her hyperglycemic readings are ranging between 10 AM-6 PM  She will occasionally get hypoglycemic with exercise or increased activity; today with not eating any carbohydrates with her lunch she got hypoglycemic when she did a correction bolus of 1.5 units for a reading over 200.  She does try to suspend her pump for increased periods of activity but is not sure how to do it for her walking program  The pump SETTINGS are: Midnight = 0.45, 5 AM = 0.9, 10 AM = 1.1, 12 noon = 1.0, 4 PM = 0.8 and 9 PM = 0.75. Carbohydrate ratio 1:8.  Blood sugar target 110   GLUCOSE CONTROL with the pump is assessed today by pump download.  She has 15  of readings below 70 and 26  readings above 180  PRE-MEAL Breakfast Lunch Dinner Bedtime Overall  Glucose range:  69-242   59-207   52-224    40-317    Mean/median:  138     150   134+/-66    POST-MEAL PC Breakfast PC Lunch PC Dinner  Glucose range:  123-239     Mean/median:       Pump management: She is bolusing 2.8  times a day, average carbohydrate intake 35  g daily  EXERCISE:  Some farming activities and also walking 30 minutes briskly at times  Wt Readings from Last 3 Encounters:  09/29/14 143 lb 9.6 oz (65.137 kg)  07/06/14 146 lb 6.4 oz (66.407 kg)  04/05/14 139 lb 9.6 oz (63.322 kg)    LABS:   A1c 6.8 from outside lab on 09/13/14; previously 6.7 on 06/24/14  Lab Results  Component Value Date   HGBA1C 7.4* 04/26/2014   HGBA1C 7.9* 11/30/2013   HGBA1C 7.4* 08/06/2013   Lab Results  Component Value Date   MICROALBUR 0.2 11/30/2013   LDLCALC 146* 04/26/2014   CREATININE 0.7 04/26/2014       Medication List       This list is accurate as of: 09/29/14  4:34 PM.  Always use your most recent med list.               aspirin 81 MG tablet  Take 81 mg by mouth daily.     BENICAR 20 MG tablet  Generic drug:  olmesartan  TAKE 1 TABLET BY MOUTH EVERY DAY     Betaine HCl 300 MG Tabs  Take 600 mg by mouth every 3 (three) days.     calcium carbonate 500 MG chewable tablet  Commonly known as:  TUMS - dosed in mg elemental calcium  Chew 1 tablet by mouth daily.     co-enzyme Q-10 50 MG capsule  Take 200 mg by mouth daily.     Cod Liver Oil 1000 MG Caps  Take by mouth. 4 tablets daily     cyanocobalamin 1000 MCG tablet  Take 100 mcg by mouth daily.     glucosamine-chondroitin 500-400 MG tablet  Take 1 tablet by mouth daily.     glucose 4 GM chewable tablet  Chew 16 g by mouth as needed.     insulin lispro 100 UNIT/ML injection  Commonly known as:  HUMALOG  Basal rate 0.9 7am-12, 0.8 Noon-7am. Max of 60 units SQ every day with pump mer MD     MAGNESIUM GLUCONATE PO  Take 400 mg by mouth.     MSM PO  Take 2,000 each by mouth daily.     NALTREXONE HCL PO  Take 4.5 mg by mouth.      PROGESTERONE (VAGINAL) 4 % Gel  Place vaginally.     Red Yeast Rice 600 MG Tabs  Take 1 tablet by mouth 4 (four) times daily.     thyroid 30 MG tablet  Commonly known as:  ARMOUR  Take 30 mg by mouth daily before breakfast. Takes 1 tablet every other day and 2 tablets alternating days     vitamin C 1000 MG tablet  Take 8,000 mg by mouth daily.     Vitamin D-3 5000 UNITS Tabs  Take 5,000 Units by mouth every other day.        Allergies: No Known Allergies  Past Medical History  Diagnosis Date  . Ovarian cyst   . Hypertension   . Insulin pump in place   . 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 Diabetes mellitus     Insulin pump  . Anxiety     new dx  . Hyperlipidemia   . S/P radiation therapy 04/10/12- 05/26/2012    left Breast and Axilla / 50 gy / 25 Fractions with Left Breast Boost / 10 Gy / 5 Fractions  . Use of letrozole (Femara) 06/13/12    Past Surgical History  Procedure Laterality Date  . Tonsillectomy    . Ovarian cyst surgery    . Colonoscopy      1 polyp   . 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 Appendectomy      Family History  Problem Relation Age of Onset  . Breast cancer Mother 81  . Prostate cancer Maternal Uncle     died in his 52s    Social History:  reports that she has never smoked. She does not have any smokeless tobacco history on file. She reports that she does not drink alcohol or use illicit drugs.  REVIEW of systems:   She has a history of mild hypothyroidism followed by PCP   Lab Results  Component Value Date   TSH 1.66 04/26/2014    HYPERCHOLESTEROLEMIA: Her LDL has been  persistently high and refuses to take a statin drug including pravastatin even though it has had no side effects  previously She was told by her wellness  physician that she does not meet treatment because her particle size is normal  However her LDL particle number is consistently high and now about the same at 1621  Also had  recent LDL of 161  from outside lab  Lab Results  Component Value Date   CHOL 225* 04/26/2014   HDL 70.00 04/26/2014   LDLCALC 146* 04/26/2014   LDLDIRECT 148.4 08/06/2013   TRIG 45.0 04/26/2014   CHOLHDL 3 04/26/2014   BP not checked at home, with other physicians it is about 140 recently  EXAM:  BP 143/66 mmHg  Pulse 71  Temp(Src) 98.3 F (36.8 C)  Resp 14  Ht 5' 3" (1.6 m)  Wt 143 lb 9.6 oz (65.137 kg)  BMI 25.44 kg/m2  SpO2 97%   ASSESSMENT:  DIABETES: Her blood sugars are continuing to be overall well controlled with A1c below 7% She is getting some hypoglycemia compared to her last visit See history of present illness for detailed discussion of current blood sugar levels, abnormal patterns, problems identified Most of her low blood sugars are related to a higher basal rate in the afternoon but occasionally from over correcting high readings She thinks him for high readings are related to infusion set issues and does not have any consistently high readings Still compliant with checking her blood sugar frequently during the day  Recommendations made:  Try to have some carbohydrates with meals if she is planning to exercise  When eating a relatively higher fat meal may add extra 1-2 units bolus  Reduce basal rate in the afternoon to 0.9 starting at 12 noon.  She can increase her 7 AM basal rate to 1.0 to help with her dawn phenomenon  Use temporary basal for up to 2 hours when starting exercise  Consider changing sensitivity to 1:40 to avoid overcorrection  HYPERTENSION:  Fairly well controlled on Benicar 20 mg, she will continue to follow-up with PCP also   HYPERLIPIDEMIA: Again she is totally against taking statin drugs even though her LDL  particle number and LDL is consistently high.  Explained to her that LDL particle size is nothing to do with her hypercholesterolemia since she is not insulin resistant type II diabetic She is however wanting to go with the recommendations of her wellness physician only She will try to look into using Zetia or WelChol as alternatives Currently her diet appears to be excellent   Counseling time over 50% of today's 25 minute visit  Patient Instructions  Exercise: reduce basal 50% for 2 hours temporarily  New basals as directed.. 7 am 1.0 and 12 =0.9  Rx Zetia or Welchol     Gina [name] 09/29/2014, 4:34 PM

## 2014-09-29 NOTE — Patient Instructions (Addendum)
Exercise: reduce basal 50% for 2 hours temporarily  New basals as directed.. 7 am 1.0 and 12 =0.9  Rx Zetia or Welchol

## 2014-10-28 ENCOUNTER — Other Ambulatory Visit: Payer: Self-pay | Admitting: Endocrinology

## 2014-11-02 ENCOUNTER — Encounter: Payer: Self-pay | Admitting: Endocrinology

## 2014-11-19 ENCOUNTER — Telehealth: Payer: Self-pay | Admitting: Endocrinology

## 2014-11-19 NOTE — Telephone Encounter (Signed)
Patient would like for you to call her concerning her medication.

## 2014-11-22 ENCOUNTER — Other Ambulatory Visit: Payer: Self-pay | Admitting: *Deleted

## 2014-11-22 MED ORDER — IRBESARTAN 150 MG PO TABS: 150.0000 mg | ORAL_TABLET | Freq: Every day | ORAL | Status: DC

## 2014-12-31 ENCOUNTER — Other Ambulatory Visit: Payer: Self-pay

## 2014-12-31 MED ORDER — INSULIN LISPRO 100 UNIT/ML ~~LOC~~ SOLN: SUBCUTANEOUS | Status: DC

## 2015-01-03 ENCOUNTER — Ambulatory Visit: Payer: Medicare Other | Admitting: Endocrinology

## 2015-01-08 ENCOUNTER — Other Ambulatory Visit: Payer: Self-pay

## 2015-01-08 ENCOUNTER — Emergency Department (HOSPITAL_COMMUNITY): Payer: Medicare Other

## 2015-01-08 ENCOUNTER — Other Ambulatory Visit (HOSPITAL_COMMUNITY): Payer: Self-pay

## 2015-01-08 ENCOUNTER — Encounter (HOSPITAL_COMMUNITY): Payer: Self-pay

## 2015-01-08 ENCOUNTER — Inpatient Hospital Stay (HOSPITAL_COMMUNITY)
Admission: EM | Admit: 2015-01-08 | Discharge: 2015-01-10 | DRG: 871 | Disposition: A | Discharge status: Home / Self Care | Payer: Medicare Other | Attending: Internal Medicine | Admitting: Internal Medicine

## 2015-01-08 DIAGNOSIS — I959 Hypotension, unspecified: Secondary | ICD-10-CM | POA: Diagnosis present

## 2015-01-08 DIAGNOSIS — Z853 Personal history of malignant neoplasm of breast: Secondary | ICD-10-CM

## 2015-01-08 DIAGNOSIS — Z794 Long term (current) use of insulin: Secondary | ICD-10-CM

## 2015-01-08 DIAGNOSIS — I10 Essential (primary) hypertension: Secondary | ICD-10-CM | POA: Diagnosis present

## 2015-01-08 DIAGNOSIS — I248 Other forms of acute ischemic heart disease: Secondary | ICD-10-CM | POA: Diagnosis present

## 2015-01-08 DIAGNOSIS — E860 Dehydration: Secondary | ICD-10-CM | POA: Diagnosis not present

## 2015-01-08 DIAGNOSIS — N390 Urinary tract infection, site not specified: Secondary | ICD-10-CM | POA: Diagnosis present

## 2015-01-08 DIAGNOSIS — R55 Syncope and collapse: Secondary | ICD-10-CM

## 2015-01-08 DIAGNOSIS — R197 Diarrhea, unspecified: Secondary | ICD-10-CM | POA: Diagnosis present

## 2015-01-08 DIAGNOSIS — I9589 Other hypotension: Secondary | ICD-10-CM | POA: Diagnosis present

## 2015-01-08 DIAGNOSIS — D696 Thrombocytopenia, unspecified: Secondary | ICD-10-CM | POA: Diagnosis present

## 2015-01-08 DIAGNOSIS — Z170 Estrogen receptor positive status [ER+]: Secondary | ICD-10-CM | POA: Diagnosis not present

## 2015-01-08 DIAGNOSIS — E1065 Type 1 diabetes mellitus with hyperglycemia: Secondary | ICD-10-CM | POA: Diagnosis present

## 2015-01-08 DIAGNOSIS — R6521 Severe sepsis with septic shock: Secondary | ICD-10-CM | POA: Diagnosis present

## 2015-01-08 DIAGNOSIS — D649 Anemia, unspecified: Secondary | ICD-10-CM | POA: Diagnosis present

## 2015-01-08 DIAGNOSIS — E039 Hypothyroidism, unspecified: Secondary | ICD-10-CM | POA: Diagnosis present

## 2015-01-08 DIAGNOSIS — R42 Dizziness and giddiness: Secondary | ICD-10-CM | POA: Diagnosis present

## 2015-01-08 DIAGNOSIS — R001 Bradycardia, unspecified: Secondary | ICD-10-CM | POA: Diagnosis present

## 2015-01-08 DIAGNOSIS — A419 Sepsis, unspecified organism: Secondary | ICD-10-CM | POA: Diagnosis present

## 2015-01-08 DIAGNOSIS — F419 Anxiety disorder, unspecified: Secondary | ICD-10-CM | POA: Diagnosis present

## 2015-01-08 DIAGNOSIS — R778 Other specified abnormalities of plasma proteins: Secondary | ICD-10-CM | POA: Diagnosis present

## 2015-01-08 DIAGNOSIS — E109 Type 1 diabetes mellitus without complications: Secondary | ICD-10-CM | POA: Diagnosis present

## 2015-01-08 DIAGNOSIS — R202 Paresthesia of skin: Secondary | ICD-10-CM | POA: Diagnosis present

## 2015-01-08 DIAGNOSIS — Z923 Personal history of irradiation: Secondary | ICD-10-CM

## 2015-01-08 DIAGNOSIS — E785 Hyperlipidemia, unspecified: Secondary | ICD-10-CM | POA: Diagnosis present

## 2015-01-08 DIAGNOSIS — Z7982 Long term (current) use of aspirin: Secondary | ICD-10-CM

## 2015-01-08 DIAGNOSIS — Z9641 Presence of insulin pump (external) (internal): Secondary | ICD-10-CM | POA: Diagnosis present

## 2015-01-08 DIAGNOSIS — E119 Type 2 diabetes mellitus without complications: Secondary | ICD-10-CM

## 2015-01-08 DIAGNOSIS — T68XXXA Hypothermia, initial encounter: Secondary | ICD-10-CM

## 2015-01-08 DIAGNOSIS — R7989 Other specified abnormal findings of blood chemistry: Secondary | ICD-10-CM | POA: Diagnosis present

## 2015-01-08 LAB — URINE MICROSCOPIC-ADD ON

## 2015-01-08 LAB — COMPREHENSIVE METABOLIC PANEL
ALK PHOS: 71 U/L (ref 39–117)
ALT: 41 U/L — AB (ref 0–35)
ANION GAP: 11 (ref 5–15)
AST: 65 U/L — ABNORMAL HIGH (ref 0–37)
Albumin: 3 g/dL — ABNORMAL LOW (ref 3.5–5.2)
BILIRUBIN TOTAL: 0.6 mg/dL (ref 0.3–1.2)
BUN: 15 mg/dL (ref 6–23)
CALCIUM: 8.3 mg/dL — AB (ref 8.4–10.5)
CO2: 23 mmol/L (ref 19–32)
Chloride: 103 mmol/L (ref 96–112)
Creatinine, Ser: 1.16 mg/dL — ABNORMAL HIGH (ref 0.50–1.10)
GFR calc Af Amer: 54 mL/min — ABNORMAL LOW (ref >90)
GFR calc non Af Amer: 47 mL/min — ABNORMAL LOW (ref >90)
GLUCOSE: 221 mg/dL — AB (ref 70–99)
POTASSIUM: 4.2 mmol/L (ref 3.5–5.1)
SODIUM: 137 mmol/L (ref 135–145)
Total Protein: 5.2 g/dL — ABNORMAL LOW (ref 6.0–8.3)

## 2015-01-08 LAB — URINALYSIS, ROUTINE W REFLEX MICROSCOPIC
Bilirubin Urine: NEGATIVE
Glucose, UA: NEGATIVE mg/dL
Hgb urine dipstick: NEGATIVE
Ketones, ur: >80 mg/dL — AB
Nitrite: NEGATIVE
Protein, ur: 100 mg/dL — AB
SPECIFIC GRAVITY, URINE: 1.021 (ref 1.005–1.030)
UROBILINOGEN UA: 0.2 mg/dL (ref 0.0–1.0)
pH: 5.5 (ref 5.0–8.0)

## 2015-01-08 LAB — CBC WITH DIFFERENTIAL/PLATELET
Basophils Absolute: 0 10*3/uL (ref 0.0–0.1)
Basophils Relative: 0 % (ref 0–1)
Eosinophils Absolute: 0 10*3/uL (ref 0.0–0.7)
Eosinophils Relative: 0 % (ref 0–5)
HCT: 32.2 % — ABNORMAL LOW (ref 36.0–46.0)
Hemoglobin: 10.4 g/dL — ABNORMAL LOW (ref 12.0–15.0)
Lymphocytes Relative: 4 % — ABNORMAL LOW (ref 12–46)
Lymphs Abs: 0.5 10*3/uL — ABNORMAL LOW (ref 0.7–4.0)
MCH: 29.5 pg (ref 26.0–34.0)
MCHC: 32.3 g/dL (ref 30.0–36.0)
MCV: 91.2 fL (ref 78.0–100.0)
MONOS PCT: 7 % (ref 3–12)
Monocytes Absolute: 0.9 10*3/uL (ref 0.1–1.0)
NEUTROS PCT: 89 % — AB (ref 43–77)
Neutro Abs: 11.6 10*3/uL — ABNORMAL HIGH (ref 1.7–7.7)
PLATELETS: 124 10*3/uL — AB (ref 150–400)
RBC: 3.53 MIL/uL — AB (ref 3.87–5.11)
RDW: 13.6 % (ref 11.5–15.5)
WBC: 13 10*3/uL — ABNORMAL HIGH (ref 4.0–10.5)

## 2015-01-08 LAB — GLUCOSE, CAPILLARY
Glucose-Capillary: 113 mg/dL — ABNORMAL HIGH (ref 70–99)
Glucose-Capillary: 139 mg/dL — ABNORMAL HIGH (ref 70–99)

## 2015-01-08 LAB — I-STAT CG4 LACTIC ACID, ED
Lactic Acid, Venous: 3.84 mmol/L (ref 0.5–2.0)
Lactic Acid, Venous: 2.79 mmol/L (ref 0.5–2.0)
Lactic Acid, Venous: 1.22 mmol/L (ref 0.5–2.0)

## 2015-01-08 LAB — CBG MONITORING, ED
Glucose-Capillary: 199 mg/dL — ABNORMAL HIGH (ref 70–99)
Glucose-Capillary: 223 mg/dL — ABNORMAL HIGH (ref 70–99)

## 2015-01-08 LAB — TROPONIN I

## 2015-01-08 LAB — INFLUENZA PANEL BY PCR (TYPE A & B)
H1N1 flu by pcr: NOT DETECTED
INFLAPCR: NEGATIVE
Influenza B By PCR: NEGATIVE

## 2015-01-08 LAB — MRSA PCR SCREENING: MRSA by PCR: NEGATIVE

## 2015-01-08 MED ORDER — SODIUM CHLORIDE 0.9 % IV SOLN
1000.0000 mL | Freq: Once | INTRAVENOUS | Status: AC
  Administered 2015-01-08: 1000 mL via INTRAVENOUS

## 2015-01-08 MED ORDER — VANCOMYCIN HCL 500 MG IV SOLR
500.0000 mg | Freq: Two times a day (BID) | INTRAVENOUS | Status: DC
  Administered 2015-01-08: 500 mg via INTRAVENOUS
  Filled 2015-01-08 (×4): qty 500

## 2015-01-08 MED ORDER — ASPIRIN EC 81 MG PO TBEC
81.0000 mg | DELAYED_RELEASE_TABLET | Freq: Every day | ORAL | Status: DC
  Administered 2015-01-08 – 2015-01-10 (×3): 81 mg via ORAL
  Filled 2015-01-08 (×3): qty 1

## 2015-01-08 MED ORDER — NONFORMULARY OR COMPOUNDED ITEM: Freq: Every day | Status: DC

## 2015-01-08 MED ORDER — SODIUM CHLORIDE 0.9 % IV SOLN
1000.0000 mL | INTRAVENOUS | Status: DC
Start: 2015-01-08 — End: 2015-01-08
  Administered 2015-01-08: 1000 mL via INTRAVENOUS

## 2015-01-08 MED ORDER — SODIUM CHLORIDE 0.9 % IJ SOLN
3.0000 mL | Freq: Two times a day (BID) | INTRAMUSCULAR | Status: DC
  Administered 2015-01-08 – 2015-01-10 (×4): 3 mL via INTRAVENOUS

## 2015-01-08 MED ORDER — ONDANSETRON HCL 4 MG/2ML IJ SOLN: 4.0000 mg | Freq: Four times a day (QID) | INTRAMUSCULAR | Status: DC | PRN

## 2015-01-08 MED ORDER — PIPERACILLIN-TAZOBACTAM 3.375 G IVPB
3.3750 g | Freq: Three times a day (TID) | INTRAVENOUS | Status: DC
  Administered 2015-01-08 – 2015-01-09 (×2): 3.375 g via INTRAVENOUS
  Filled 2015-01-08 (×4): qty 50

## 2015-01-08 MED ORDER — SODIUM CHLORIDE 0.9 % IV BOLUS (SEPSIS)
1000.0000 mL | Freq: Once | INTRAVENOUS | Status: AC
  Administered 2015-01-08: 1000 mL via INTRAVENOUS

## 2015-01-08 MED ORDER — ACETAMINOPHEN 325 MG PO TABS
650.0000 mg | ORAL_TABLET | Freq: Four times a day (QID) | ORAL | Status: DC | PRN
  Administered 2015-01-09: 650 mg via ORAL
  Filled 2015-01-08: qty 2

## 2015-01-08 MED ORDER — SODIUM CHLORIDE 0.9 % IV SOLN
INTRAVENOUS | Status: DC
  Administered 2015-01-08 – 2015-01-09 (×2): via INTRAVENOUS

## 2015-01-08 MED ORDER — ENOXAPARIN SODIUM 40 MG/0.4ML ~~LOC~~ SOLN
40.0000 mg | SUBCUTANEOUS | Status: DC
  Administered 2015-01-08 – 2015-01-09 (×2): 40 mg via SUBCUTANEOUS
  Filled 2015-01-08 (×3): qty 0.4

## 2015-01-08 MED ORDER — ALBUTEROL SULFATE (2.5 MG/3ML) 0.083% IN NEBU: 2.5000 mg | INHALATION_SOLUTION | RESPIRATORY_TRACT | Status: DC | PRN

## 2015-01-08 MED ORDER — VANCOMYCIN HCL IN DEXTROSE 1-5 GM/200ML-% IV SOLN
1000.0000 mg | Freq: Once | INTRAVENOUS | Status: AC
  Administered 2015-01-08: 1000 mg via INTRAVENOUS
  Filled 2015-01-08: qty 200

## 2015-01-08 MED ORDER — ONDANSETRON HCL 4 MG PO TABS: 4.0000 mg | ORAL_TABLET | Freq: Four times a day (QID) | ORAL | Status: DC | PRN

## 2015-01-08 MED ORDER — PIPERACILLIN-TAZOBACTAM 3.375 G IVPB 30 MIN
3.3750 g | Freq: Once | INTRAVENOUS | Status: AC
  Administered 2015-01-08: 3.375 g via INTRAVENOUS
  Filled 2015-01-08: qty 50

## 2015-01-08 MED ORDER — ACETAMINOPHEN 650 MG RE SUPP: 650.0000 mg | Freq: Four times a day (QID) | RECTAL | Status: DC | PRN

## 2015-01-08 MED ORDER — ALUM & MAG HYDROXIDE-SIMETH 200-200-20 MG/5ML PO SUSP: 30.0000 mL | Freq: Four times a day (QID) | ORAL | Status: DC | PRN

## 2015-01-08 MED ORDER — FAMOTIDINE IN NACL 20-0.9 MG/50ML-% IV SOLN
20.0000 mg | Freq: Two times a day (BID) | INTRAVENOUS | Status: DC
  Administered 2015-01-08 – 2015-01-09 (×2): 20 mg via INTRAVENOUS
  Filled 2015-01-08 (×4): qty 50

## 2015-01-08 MED ORDER — INSULIN PUMP
Freq: Three times a day (TID) | SUBCUTANEOUS | Status: DC
  Administered 2015-01-09 – 2015-01-10 (×4): via SUBCUTANEOUS
  Administered 2015-01-10: 1.9 via SUBCUTANEOUS
  Administered 2015-01-10: 3.2 via SUBCUTANEOUS
  Filled 2015-01-08: qty 1

## 2015-01-08 MED ORDER — THYROID 30 MG PO TABS
30.0000 mg | ORAL_TABLET | Freq: Every day | ORAL | Status: DC
  Administered 2015-01-09 – 2015-01-10 (×2): 30 mg via ORAL
  Filled 2015-01-08 (×3): qty 1

## 2015-01-08 NOTE — ED Notes (Addendum)
Pt checked BS @ 12:45pm, BS 221.

## 2015-01-08 NOTE — ED Notes (Signed)
CBG 199 

## 2015-01-08 NOTE — ED Notes (Signed)
Attempted report 

## 2015-01-08 NOTE — ED Notes (Signed)
Per EMS - pt has been on new gluten free diet x2 weeks and has had diarrhea since then. Pt went to use RR - felt tingling in right arm around 0240. Pt went to couch and felt light-headed, episode of diarrhea, positive for LOC, 1 episode of vomiting. Pt initially 62/48 - given 800cc fluid and BP 80/40; hr 40bpm, 20G RAC.

## 2015-01-08 NOTE — ED Notes (Signed)
Pt checked BS 230.  Pt gave self 2.8 units per insulin pump.

## 2015-01-08 NOTE — H&P (Signed)
History and Physical  Gina [name] KGM:[id number] DOB: 10-Oct-1946 DOA: 01/08/2015  Referring physician: Dr. Dione Booze, EDP PCP: Leo Grosser, MD  Outpatient Specialists:  1. Endocrinology: Dr. Reather Littler.  Chief Complaint: Passed out, dizziness & lightheadedness, tingling of both hands, diarrhea 1  HPI: Gina [name] is a 69 y.o. female with history of type I DM on insulin pump, essential hypertension, hyperlipidemia, hypothyroid, presented to the [facility] ED on 01/08/15 with complaints of dizziness, lightheadedness, passing out 1, tingling of both hands and diarrhea 1. EMS initially found with blood pressure of 62/48 mmHg. Following 800 mL fluid bolus, blood pressures improved to 80/40, bradycardia in the 30s and patient was transported to ED where she remained hypotensive and was found to be hypothermic. She was assessed as sepsis and started on sepsis protocol. She has received 3 L of IV fluid bolus, IV vancomycin and Zosyn after cultures percent, chest x-ray is negative for pneumonia findings and urine microscopy is pending. Patient's blood pressures have improved to the 110s. Temperature has normalized. Patient states that her hands and laying resolved over the last hour and a half (about the time that her blood pressure normalized). Hospitalist admission requested.  Patient states that she was in her usual state of health until she went to bed last night. She actually drove back from [place]. No sickly contacts reported. She woke up at about 2:30 AM to urinate and when she went back to her bed she noticed some tingling of the right hand. She checked her face and said a few words to make sure she wasn't having a stroke and did not notice any facial asymmetry or slurred speech. She did not want to wake up her husband and hence went and laid down on the couch when she noticed tingling now off both hands. After a little while, she got up to use the restroom again and had diarrhea 1 without  blood or mucus. On getting up, she felt dizzy and lightheaded and while walking back to the couch, she passed out and landed on the kitchen floor. She hurt her right knee which is not very painful. Duration of LOC unknown. On waking up, she felt nauseous and called her spouse. He noticed that she was pale but no other significant findings. EMS was activated. Patient denies headache, earache, sore throat, fevers, chills, dyspnea, chest pain or abdominal pain. She has mild chronic intermittent cough related to the season and allergies. She had nausea and nonbloody small emesis 1. She does not complain of dysuria or urinary frequency. No insect bites or open wounds.    Review of Systems: All systems reviewed and apart from history of presenting illness, are negative.  Past Medical History  Diagnosis Date  . Ovarian cyst   . Hypertension   . Insulin pump in place   . 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 Diabetes mellitus     Insulin pump  . Anxiety     new dx  . Hyperlipidemia   . S/P radiation therapy 04/10/12- 05/26/2012    left Breast and Axilla / 50 gy / 25 Fractions with Left Breast Boost / 10 Gy / 5 Fractions  . Use of letrozole (Femara) 06/13/12   Past Surgical History  Procedure Laterality Date  . Tonsillectomy    . Ovarian cyst surgery    . Colonoscopy      1 polyp   . 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 Appendectomy     Social History:  reports that she has never smoked. She does not have any smokeless tobacco history on file. She reports that she does not drink alcohol or use illicit drugs.  married. Independent of activities of daily living. Never smoked.   No Known Allergies  Family History  Problem Relation Age of Onset  .  Breast cancer Mother 43  . Prostate cancer Maternal Uncle     died in his 51s   mother died at age 89 from CHF complications. Father died in his early 58s of a heart attack.  Prior to Admission medications   Medication Sig Start Date End Date Taking? Authorizing Provider  Ascorbic Acid (VITAMIN C) 1000 MG tablet Take 8,000 mg by mouth daily.   Yes Historical Provider, MD  aspirin 81 MG tablet Take 81 mg by mouth daily.   Yes Historical Provider, MD  BENICAR 20 MG tablet TAKE 1 TABLET BY MOUTH EVERY DAY 10/29/14  Yes Elayne Snare, MD  Betaine HCl 300 MG TABS Take 600 mg by mouth every 3 (three) days.    Yes Historical Provider, MD  Cholecalciferol (VITAMIN D-3) 5000 UNITS TABS Take 5,000 Units by mouth daily.    Yes Historical Provider, MD  co-enzyme Q-10 50 MG capsule Take 200 mg by mouth daily.    Yes Historical Provider, MD  [facility] Liver Oil 1000 MG CAPS Take by mouth. 4 tablets daily   Yes Historical Provider, MD  cyanocobalamin 1000 MCG tablet Take 1,000 mcg by mouth daily.    Yes Historical Provider, MD  glucose 4 GM chewable tablet Chew 16 g by mouth as needed.   Yes Historical Provider, MD  insulin lispro (HUMALOG) 100 UNIT/ML injection Basal rate 0.9 7am-12, 0.8 Noon-7am. Max of 60 units SQ every day with pump mer MD DX CODE: E10.65 Patient taking differently: Insulin pump - Basal rate 0.9 7am-12, 0.8 Noon-7am. Max of 60 units SQ every day with pump mer MD DX CODE: E10.65 12/31/14  Yes Elayne Snare, MD  MAGNESIUM GLYCINATE PLUS PO Take 400 mg by mouth daily.   Yes Historical Provider, MD  Methylsulfonylmethane (MSM PO) Take 1,000 each by mouth daily.    Yes Historical Provider, MD  Misc Natural Products (PROGESTERONE EX) Apply 1 application topically daily. Apply 4 clicks to chest daily.   Yes Historical Provider, MD  NALTREXONE HCL PO Take 4.5 mg by mouth.   Yes Historical Provider, MD  naproxen sodium (ANAPROX) 220 MG tablet Take 220 mg by mouth 2 (two) times daily as needed (pain).   Yes  Historical Provider, MD  OVER THE COUNTER MEDICATION Take 12.5 mg by mouth daily. Iodoral   Yes Historical Provider, MD  OVER THE COUNTER MEDICATION Take 1,000 mg by mouth daily. Calcium supplement   Yes Historical Provider, MD  OVER THE COUNTER MEDICATION Take 15-30 mLs by mouth daily. Take 1-2 heaping tablespoons of diatomaceous earth by mouth daily   Yes Historical Provider, MD  OVER THE COUNTER MEDICATION Take 100 mg by mouth daily. DIM (Diindolylmethane) - hormone supplement   Yes Historical Provider, MD  OVER THE COUNTER MEDICATION Take 1,000 mg by mouth daily. AHCC (active hexose correlated compound)   Yes Historical Provider, MD  Red Yeast Rice 600 MG TABS Take 1 tablet by mouth 4 (four) times  daily.   Yes Historical Provider, MD  Resveratrol 250 MG CAPS Take 250 mg by mouth daily.   Yes Historical Provider, MD  thyroid (ARMOUR) 30 MG tablet Take 30 mg by mouth daily before breakfast. Takes 1 tablet every other day and 2 tablets alternating days   Yes Historical Provider, MD  irbesartan (AVAPRO) 150 MG tablet Take 1 tablet (150 mg total) by mouth daily. Patient not taking: Reported on 01/08/2015 11/22/14   Elayne Snare, MD   Physical Exam: Filed Vitals:   01/08/15 0900 01/08/15 0915 01/08/15 0925 01/08/15 0930  BP: 117/56 102/90  117/44  Pulse: 104 115  109  Temp:   99 F (37.2 C)   TempSrc:   Rectal   Resp: $Remo'25 24  17  'EWlUB$ Weight:      SpO2: 95% 99%  100%     General exam: Moderately built and nourished pleasant elderly female patient, lying comfortably propped up on the gurney in no obvious distress.  Head, eyes and ENT: Nontraumatic and normocephalic. Pupils equally reacting to light and accommodation. Oral mucosa slightly dry.  Neck: Supple. No JVD, carotid bruit or thyromegaly.  Lymphatics: No lymphadenopathy.  Respiratory system: clear anteriorly. Few coarse crackles posteriorly-? Chronic. No increased work of breathing.  Cardiovascular system: S1 and S2 heard, RRR. No JVD,  murmurs, gallops, clicks or pedal edema.  Gastrointestinal system: Abdomen is nondistended, soft and nontender. Normal bowel sounds heard. No organomegaly or masses appreciated.  Central nervous system: Alert and oriented. No focal neurological deficits.  Extremities: Symmetric 5 x 5 power. Peripheral pulses symmetrically felt.   Skin: No rashes or acute findings.  Musculoskeletal system: Negative exam.  Psychiatry: Pleasant and cooperative.   Labs on Admission:  Basic Metabolic Panel:  Recent Labs Lab 01/08/15 0641  NA 137  K 4.2  CL 103  CO2 23  GLUCOSE 221*  BUN 15  CREATININE 1.16*  CALCIUM 8.3*   Liver Function Tests:  Recent Labs Lab 01/08/15 0641  AST 65*  ALT 41*  ALKPHOS 71  BILITOT 0.6  PROT 5.2*  ALBUMIN 3.0*   No results for input(s): LIPASE, AMYLASE in the last 168 hours. No results for input(s): AMMONIA in the last 168 hours. CBC:  Recent Labs Lab 01/08/15 0641  WBC 13.0*  NEUTROABS 11.6*  HGB 10.4*  HCT 32.2*  MCV 91.2  PLT 124*   Cardiac Enzymes:  Recent Labs Lab 01/08/15 0641  TROPONINI <0.03    BNP (last 3 results) No results for input(s): PROBNP in the last 8760 hours. CBG:  Recent Labs Lab 01/08/15 0555  GLUCAP 199*    Radiological Exams on Admission: Dg Chest Port 1 View  01/08/2015   CLINICAL DATA:  Dizziness and right arm tingling  EXAM: PORTABLE CHEST - 1 VIEW  COMPARISON:  None.  FINDINGS: There is a degree of underlying emphysematous change. There is generalized interstitial prominence without airspace consolidation. The heart is upper normal in size with mild pulmonary venous hypertension. No adenopathy. No bone lesions. There is calcification in the right carotid artery.  IMPRESSION: Findings felt to represent a degree of congestive heart failure superimposed on emphysematous change. No airspace consolidation. Calcification right carotid artery.   Electronically Signed   By: Lowella Grip III M.D.   On:  01/08/2015 08:41    EKG: Independently reviewed. Sinus rhythm, normal axis and no acute changes.  Assessment/Plan  69 y.o. female with history of type I DM on insulin pump, essential hypertension, hyperlipidemia, hypothyroid, presented to  the [facility] ED on 01/08/15 with complaints of dizziness, lightheadedness, passing out 1, tingling of both hands and diarrhea 1. She was in her usual state of health until she went to bed last night. Patient found to be hypotensive and hypothermic without clear focus of sepsis based on history, physical exam, lab work or imaging.  Principal Problem:   Sepsis - Source not clear. - Follow blood cultures, urine microscopy, C. difficile panel PCR and influenza panel PCR. - Follow repeat chest x-ray 3/27. - Initiated sepsis protocol on ED arrival and has thus far received 3 L of IV fluids. Continue empiric IV vancomycin and Zosyn pending culture results. - Improved with normalization of blood pressure and temperature. - Potential source: UTI.  Active Problems:   Syncope and collapse - Most likely secondary to hypotension. - EMS did note bradycardia and hence we'll place on telemetry. - Check orthostatic blood pressures.     Uncontrolled type 1 diabetes mellitus - Continue home insulin pump regimen and monitor CBGs closely.    Essential hypertension - Came in with hypotension. Hold home antihypertensives.    Dyslipidemia - Apparently intolerant to a lot of statins.    Hypothyroidism, acquired, autoimmune - Continue thyroid supplements as per home regimen.    Hypotension - Related to sepsis. Improved after IV fluid hydration.    Hypothermia - Likely secondary to sepsis physiology. Treat as above. - Temperature has normalized after warming blanket.    Diarrhea - 1 episode. - No reported antibiotic exposure or hospitalization. - May be viral. No reported eating anything unusual or sickly contacts. - Check C. difficile panel PCR.     Dehydration - IV fluids.    Tingling - Of both hands. Likely related to hypotension. - No other strokelike symptoms. No focal deficits on exam. - Resolved after blood pressures normalize. - Monitor closely. If has recurrent symptoms, then consider further evaluation.    Anemia - Follow CBCs.    Thrombocytopenia - No reported bleeding. Follow CBCs daily.   Transient bradycardia - Per EMS. - Resolved. Monitor on telemetry. - Check TSH.  Code Status: Full  Family Communication:  Discussed with patient's spouse and daughter at bedside. Disposition Plan: home when medically stable.   Time spent: 70 minutes.  Vernell Leep, MD, FACP, FHM. [facility] Pager [phone]  If 7PM-7AM, please contact night-coverage [email or web address] Password [email or web address] 01/08/2015, 10:12 AM

## 2015-01-08 NOTE — Progress Notes (Addendum)
ANTIBIOTIC CONSULT NOTE - INITIAL  Pharmacy Consult:  Vancomycin / Zosyn Indication:  Code sepsis  No Known Allergies  Patient Measurements: Weight: 143 lb 8.3 oz (65.1 kg)  Vital Signs: Temp: 96.3 F (35.7 C) (03/26 0731) Temp Source: Rectal (03/26 0731) BP: 92/36 mmHg (03/26 0715) Pulse Rate: 92 (03/26 0715) Intake/Output from previous day: 03/25 0701 - 03/26 0700 In: 1000 [I.V.:1000] Out: -  Intake/Output from this shift: Total I/O In: 1000 [I.V.:1000] Out: -   Labs:  Recent Labs  01/08/15 0641  WBC 13.0*  HGB 10.4*  PLT 124*  CREATININE 1.16*   Estimated Creatinine Clearance: 41 mL/min (by C-G formula based on Cr of 1.16). No results for input(s): VANCOTROUGH, VANCOPEAK, VANCORANDOM, GENTTROUGH, GENTPEAK, GENTRANDOM, TOBRATROUGH, TOBRAPEAK, TOBRARND, AMIKACINPEAK, AMIKACINTROU, AMIKACIN in the last 72 hours.   Microbiology: No results found for this or any previous visit (from the past 720 hour(s)).  Medical History: Past Medical History  Diagnosis Date  . Ovarian cyst   . Hypertension   . Insulin pump in place   . 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 Diabetes mellitus     Insulin pump  . Anxiety     new dx  . Hyperlipidemia   . S/P radiation therapy 04/10/12- 05/26/2012    left Breast and Axilla / 50 gy / 25 Fractions with Left Breast Boost / 10 Gy / 5 Fractions  . Use of letrozole (Femara) 06/13/12      Assessment: 12 YOF presented s/p passing out at home after feeling tingling in her right arm and vomiting.  Pharmacy consulted to initiate vancomycin and Zosyn for sepsis.  First doses of antibiotics already ordered.  Baseline labs and home meds reviewed.   Goal of Therapy:  Vancomycin trough level 15-20 mcg/ml   Plan:  - Vanc 500mg  IV Q12H, start tonight - Zosyn 3.375gm IV Q8H, 4 hr infusion - Monitor renal fxn, clinical progress, vanc trough at  Css    Gina [name] D. Mina Marble, PharmD, BCPS Pager:  [phone] - [phone] 01/08/2015, 7:53 AM    ==========================   Addendum: - patient uses naltrexone compounded at a different dose and to continue while hospitalized - patient aware to bring home supply as the hospital does not carry this strength    Jarryd Gratz D. Mina Marble, PharmD, BCPS Pager:  [phone] 01/08/2015, 12:20 PM

## 2015-01-08 NOTE — ED Notes (Signed)
Per Junie Panning, RN pt has received NS 1000 ml x 3 bolus, 4th NS bag infusing @ 125 ml/hr.

## 2015-01-08 NOTE — ED Provider Notes (Signed)
CSN: [id number]     Arrival date & time 01/08/15  5465 History   First MD Initiated Contact with Patient 01/08/15 [phone]     Chief Complaint  Patient presents with  . Hypotension  . Loss of Consciousness  . Numbness     (Consider location/radiation/quality/duration/timing/severity/associated sxs/prior Treatment) Patient is a 69 y.o. female presenting with syncope. The history is provided by the patient and the spouse.  Loss of Consciousness She had passed out at home. She woke up to go to the bathroom and on returning to bed, noted some tingling in her right arm. She walked to the side of the house and sat down. She felt like she was going to have some mild diarrhea. She went back to the bathroom and noted the tingling in her right arm had no spread to involve the left arm and legs. She passed out after getting up from a chair following this. She denies nausea but apparently she did vomit at home. She denies chest pain, heaviness, tightness, pressure. She is continuing to feel lightheaded and has noted a dry mouth. Ambulance had been called and noted initial blood pressure of 62/48. Following 800 mL of fluid, blood pressure was still down at 80/40 but was noted to be bradycardic with heart rate of 30. Patient states that the numbness in her feet has gone away but she still feels numbness in both hands.  Past Medical History  Diagnosis Date  . Ovarian cyst   . Hypertension   . Insulin pump in place   . 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 Diabetes mellitus     Insulin pump  . Anxiety     new dx  . Hyperlipidemia   . S/P radiation therapy 04/10/12- 05/26/2012    left Breast and Axilla / 50 gy / 25 Fractions with Left Breast Boost / 10 Gy / 5 Fractions  . Use of letrozole (Femara) 06/13/12   Past Surgical History  Procedure Laterality Date  . Tonsillectomy    . Ovarian cyst surgery    . Colonoscopy     1 polyp   . 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 Appendectomy     Family History  Problem Relation Age of Onset  . Breast cancer Mother 49  . Prostate cancer Maternal Uncle     died in his 85s   History  Substance Use Topics  . Smoking status: Never Smoker   . Smokeless tobacco: Not on file  . Alcohol Use: No     Comment: menarche age 28,menopause age 73,hrt short time oral,then progesterone cream ,stopped g3,p3,1st age 84   OB History    Gravida Para Term Preterm AB TAB SAB Ectopic Multiple Living   3 3              Obstetric Comments   Menarche age 2, Parity age 64, Last Menses age 25, Oral contraceptives less than a month. applie Progesterone cream to chest for over 15 years.     Review of Systems  Cardiovascular: Positive for syncope.  All other systems reviewed and are negative.     Allergies  Review of patient's allergies indicates no known allergies.  Home Medications   Prior to Admission medications   Medication Sig Start Date  End Date Taking? Authorizing Provider  Ascorbic Acid (VITAMIN C) 1000 MG tablet Take 8,000 mg by mouth daily.    Historical Provider, MD  aspirin 81 MG tablet Take 81 mg by mouth daily.    Historical Provider, MD  BENICAR 20 MG tablet TAKE 1 TABLET BY MOUTH EVERY DAY 10/29/14   Elayne Snare, MD  Betaine HCl 300 MG TABS Take 600 mg by mouth every 3 (three) days.     Historical Provider, MD  calcium carbonate (TUMS - DOSED IN MG ELEMENTAL CALCIUM) 500 MG chewable tablet Chew 1 tablet by mouth daily.    Historical Provider, MD  Cholecalciferol (VITAMIN D-3) 5000 UNITS TABS Take 5,000 Units by mouth every other day.     Historical Provider, MD  co-enzyme Q-10 50 MG capsule Take 200 mg by mouth daily.     Historical Provider, MD  [facility] Liver Oil 1000 MG CAPS Take by mouth. 4 tablets  daily    Historical Provider, MD  cyanocobalamin 1000 MCG tablet Take 100 mcg by mouth daily.    Historical Provider, MD  glucosamine-chondroitin 500-400 MG tablet Take 1 tablet by mouth daily.     Historical Provider, MD  glucose 4 GM chewable tablet Chew 16 g by mouth as needed.    Historical Provider, MD  insulin lispro (HUMALOG) 100 UNIT/ML injection Basal rate 0.9 7am-12, 0.8 Noon-7am. Max of 60 units SQ every day with pump mer MD DX CODE: E10.65 12/31/14   Elayne Snare, MD  irbesartan (AVAPRO) 150 MG tablet Take 1 tablet (150 mg total) by mouth daily. 11/22/14   Elayne Snare, MD  MAGNESIUM GLUCONATE PO Take 400 mg by mouth.    Historical Provider, MD  Methylsulfonylmethane (MSM PO) Take 2,000 each by mouth daily.    Historical Provider, MD  NALTREXONE HCL PO Take 4.5 mg by mouth.    Historical Provider, MD  PROGESTERONE, VAGINAL, 4 % GEL Place vaginally.    Historical Provider, MD  Red Yeast Rice 600 MG TABS Take 1 tablet by mouth 4 (four) times daily.    Historical Provider, MD  thyroid (ARMOUR) 30 MG tablet Take 30 mg by mouth daily before breakfast. Takes 1 tablet every other day and 2 tablets alternating days    Historical Provider, MD   BP 75/33 mmHg  Pulse 67  Resp 17  SpO2 100% Physical Exam  Nursing note and vitals reviewed.  69 year old female, resting comfortably and in no acute distress. Vital signs are significant for hypotension. Oxygen saturation is 100%, which is normal. Head is normocephalic and atraumatic. PERRLA, EOMI. Oropharynx is clear. Neck is nontender and supple without adenopathy or JVD. Back is nontender and there is no CVA tenderness. Lungs are clear without rales, wheezes, or rhonchi. Chest is nontender. Heart has regular rate and rhythm without murmur. Abdomen is soft, flat, nontender without masses or hepatosplenomegaly and peristalsis is normoactive. Aorta is palpable but nontender and without bruits. Extremities have no cyanosis or edema, full range of  motion is present. Skin is warm and dry without rash. Neurologic: Mental status is normal, cranial nerves are intact, there are no motor or sensory deficits.  ED Course  Procedures (including critical care time) Labs Review Results for orders placed or performed during the hospital encounter of 01/08/15  Comprehensive metabolic panel  Result Value Ref Range   Sodium 137 135 - 145 mmol/L   Potassium 4.2 3.5 - 5.1 mmol/L   Chloride 103 96 - 112 mmol/L   CO2  23 19 - 32 mmol/L   Glucose, Bld 221 (H) 70 - 99 mg/dL   BUN 15 6 - 23 mg/dL   Creatinine, Ser 1.16 (H) 0.50 - 1.10 mg/dL   Calcium 8.3 (L) 8.4 - 10.5 mg/dL   Total Protein 5.2 (L) 6.0 - 8.3 g/dL   Albumin 3.0 (L) 3.5 - 5.2 g/dL   AST 65 (H) 0 - 37 U/L   ALT 41 (H) 0 - 35 U/L   Alkaline Phosphatase 71 39 - 117 U/L   Total Bilirubin 0.6 0.3 - 1.2 mg/dL   GFR calc non Af Amer 47 (L) >90 mL/min   GFR calc Af Amer 54 (L) >90 mL/min   Anion gap 11 5 - 15  Troponin I  Result Value Ref Range   Troponin I <0.03 <0.031 ng/mL  CBC with Differential  Result Value Ref Range   WBC 13.0 (H) 4.0 - 10.5 K/uL   RBC 3.53 (L) 3.87 - 5.11 MIL/uL   Hemoglobin 10.4 (L) 12.0 - 15.0 g/dL   HCT 32.2 (L) 36.0 - 46.0 %   MCV 91.2 78.0 - 100.0 fL   MCH 29.5 26.0 - 34.0 pg   MCHC 32.3 30.0 - 36.0 g/dL   RDW 13.6 11.5 - 15.5 %   Platelets 124 (L) 150 - 400 K/uL   Neutrophils Relative % 89 (H) 43 - 77 %   Lymphocytes Relative 4 (L) 12 - 46 %   Monocytes Relative 7 3 - 12 %   Eosinophils Relative 0 0 - 5 %   Basophils Relative 0 0 - 1 %   Neutro Abs 11.6 (H) 1.7 - 7.7 K/uL   Lymphs Abs 0.5 (L) 0.7 - 4.0 K/uL   Monocytes Absolute 0.9 0.1 - 1.0 K/uL   Eosinophils Absolute 0.0 0.0 - 0.7 K/uL   Basophils Absolute 0.0 0.0 - 0.1 K/uL   RBC Morphology BURR CELLS    WBC Morphology VACUOLATED NEUTROPHILS   I-Stat CG4 Lactic Acid, ED  Result Value Ref Range   Lactic Acid, Venous 3.84 (HH) 0.5 - 2.0 mmol/L   Comment NOTIFIED PHYSICIAN   CBG  monitoring, ED  Result Value Ref Range   Glucose-Capillary 199 (H) 70 - 99 mg/dL  I-Stat CG4 Lactic Acid, ED  Result Value Ref Range   Lactic Acid, Venous 2.79 (HH) 0.5 - 2.0 mmol/L   Comment NOTIFIED PHYSICIAN    Imaging Review No results found.   EKG Interpretation   Date/Time:  Saturday January 08 2015 05:43:50 EDT Ventricular Rate:  68 PR Interval:  163 QRS Duration: 72 QT Interval:  448 QTC Calculation: 476 R Axis:   86 Text Interpretation:  Sinus rhythm Consider left atrial enlargement  Borderline right axis deviation Probable anteroseptal infarct, old When  compared with ECG of 02/29/2012, No significant change was found Confirmed  by [facility]  MD, Lenix Kidd ([id number]) on 01/08/2015 5:59:00 AM      CRITICAL CARE Performed by: [email or web address],[email or web address] Total critical care time: 100 minutes Critical care time was exclusive of separately billable procedures and treating other patients. Critical care was necessary to treat or prevent imminent or life-threatening deterioration. Critical care was time spent personally by me on the following activities: development of treatment plan with patient and/or surrogate as well as nursing, discussions with consultants, evaluation of patient's response to treatment, examination of patient, obtaining history from patient or surrogate, ordering and performing treatments and interventions, ordering and review of laboratory studies, ordering and review of radiographic studies, pulse oximetry  and re-evaluation of patient's condition.  MDM   Final diagnoses:  Hypotension, unspecified hypotension type  Syncope, unspecified syncope type  Hypothermia, initial encounter  Elevated lactic acid level  Normochromic normocytic anemia    Syncopal episode with persistent hypotension. She's being given IV fluids and screening labs are obtained.  Lactic acid is come back elevated and patient patient is a hypothermic consistent with sepsis syndrome. Early goal-directed  therapy has already been initiated due to her blood pressure. There is no obvious source, so she is started on antibiotics for sepsis of undetermined cause. Repeat lactic acid level is decreased but not back to normal. Blood pressure has stabilized in the mid 90s. Patient continues to [facility] well. Case is discussed with Dr. Alg[facility] agrees to admit the patient.    Delora Fuel, MD 14/23/95 [id number]

## 2015-01-08 NOTE — ED Notes (Signed)
EDP Dr. Roxanne Mins at bedside; [place], [place] - manual BP of 100/40.

## 2015-01-09 ENCOUNTER — Inpatient Hospital Stay (HOSPITAL_COMMUNITY): Payer: Medicare Other

## 2015-01-09 DIAGNOSIS — R55 Syncope and collapse: Secondary | ICD-10-CM | POA: Diagnosis not present

## 2015-01-09 DIAGNOSIS — E109 Type 1 diabetes mellitus without complications: Secondary | ICD-10-CM | POA: Diagnosis present

## 2015-01-09 DIAGNOSIS — R001 Bradycardia, unspecified: Secondary | ICD-10-CM

## 2015-01-09 DIAGNOSIS — A419 Sepsis, unspecified organism: Secondary | ICD-10-CM | POA: Diagnosis not present

## 2015-01-09 DIAGNOSIS — N390 Urinary tract infection, site not specified: Secondary | ICD-10-CM

## 2015-01-09 DIAGNOSIS — E038 Other specified hypothyroidism: Secondary | ICD-10-CM

## 2015-01-09 DIAGNOSIS — I9589 Other hypotension: Secondary | ICD-10-CM

## 2015-01-09 DIAGNOSIS — R778 Other specified abnormalities of plasma proteins: Secondary | ICD-10-CM | POA: Diagnosis present

## 2015-01-09 DIAGNOSIS — E1065 Type 1 diabetes mellitus with hyperglycemia: Secondary | ICD-10-CM

## 2015-01-09 DIAGNOSIS — I10 Essential (primary) hypertension: Secondary | ICD-10-CM

## 2015-01-09 DIAGNOSIS — R7989 Other specified abnormal findings of blood chemistry: Secondary | ICD-10-CM

## 2015-01-09 DIAGNOSIS — D696 Thrombocytopenia, unspecified: Secondary | ICD-10-CM

## 2015-01-09 DIAGNOSIS — R197 Diarrhea, unspecified: Secondary | ICD-10-CM

## 2015-01-09 DIAGNOSIS — E785 Hyperlipidemia, unspecified: Secondary | ICD-10-CM

## 2015-01-09 LAB — CBC
HCT: 29.1 % — ABNORMAL LOW (ref 36.0–46.0)
HEMOGLOBIN: 9.7 g/dL — AB (ref 12.0–15.0)
MCH: 30.1 pg (ref 26.0–34.0)
MCHC: 33.3 g/dL (ref 30.0–36.0)
MCV: 90.4 fL (ref 78.0–100.0)
Platelets: 145 10*3/uL — ABNORMAL LOW (ref 150–400)
RBC: 3.22 MIL/uL — ABNORMAL LOW (ref 3.87–5.11)
RDW: 14 % (ref 11.5–15.5)
WBC: 7.9 10*3/uL (ref 4.0–10.5)

## 2015-01-09 LAB — COMPREHENSIVE METABOLIC PANEL
ALBUMIN: 2.6 g/dL — AB (ref 3.5–5.2)
ALK PHOS: 55 U/L (ref 39–117)
ALT: 42 U/L — AB (ref 0–35)
AST: 51 U/L — ABNORMAL HIGH (ref 0–37)
Anion gap: 7 (ref 5–15)
BUN: 15 mg/dL (ref 6–23)
CO2: 22 mmol/L (ref 19–32)
Calcium: 7.6 mg/dL — ABNORMAL LOW (ref 8.4–10.5)
Chloride: 107 mmol/L (ref 96–112)
Creatinine, Ser: 0.82 mg/dL (ref 0.50–1.10)
GFR calc Af Amer: 82 mL/min — ABNORMAL LOW (ref >90)
GFR calc non Af Amer: 71 mL/min — ABNORMAL LOW (ref >90)
Glucose, Bld: 100 mg/dL — ABNORMAL HIGH (ref 70–99)
Potassium: 3.4 mmol/L — ABNORMAL LOW (ref 3.5–5.1)
SODIUM: 136 mmol/L (ref 135–145)
TOTAL PROTEIN: 4.9 g/dL — AB (ref 6.0–8.3)
Total Bilirubin: 0.9 mg/dL (ref 0.3–1.2)

## 2015-01-09 LAB — LIPID PANEL
Cholesterol: 206 mg/dL — ABNORMAL HIGH (ref 0–200)
HDL: 68 mg/dL (ref >39)
LDL Cholesterol: 130 mg/dL — ABNORMAL HIGH (ref 0–99)
TRIGLYCERIDES: 40 mg/dL (ref <150)
Total CHOL/HDL Ratio: 3 RATIO
VLDL: 8 mg/dL (ref 0–40)

## 2015-01-09 LAB — TROPONIN I
TROPONIN I: 0.61 ng/mL — AB (ref <0.031)
Troponin I: 0.82 ng/mL (ref <0.031)
Troponin I: 0.75 ng/mL (ref <0.031)

## 2015-01-09 LAB — GLUCOSE, CAPILLARY
GLUCOSE-CAPILLARY: 126 mg/dL — AB (ref 70–99)
GLUCOSE-CAPILLARY: 177 mg/dL — AB (ref 70–99)
Glucose-Capillary: 99 mg/dL (ref 70–99)
Glucose-Capillary: 91 mg/dL (ref 70–99)
Glucose-Capillary: 93 mg/dL (ref 70–99)

## 2015-01-09 LAB — URINE CULTURE
Colony Count: NO GROWTH
Culture: NO GROWTH

## 2015-01-09 LAB — TSH: TSH: 2.45 u[IU]/mL (ref 0.350–4.500)

## 2015-01-09 MED ORDER — FAMOTIDINE 20 MG PO TABS
20.0000 mg | ORAL_TABLET | Freq: Two times a day (BID) | ORAL | Status: DC
  Administered 2015-01-09 – 2015-01-10 (×2): 20 mg via ORAL
  Filled 2015-01-09 (×3): qty 1

## 2015-01-09 MED ORDER — CEFTRIAXONE SODIUM IN DEXTROSE 20 MG/ML IV SOLN
1.0000 g | INTRAVENOUS | Status: DC
  Administered 2015-01-09: 1 g via INTRAVENOUS
  Filled 2015-01-09 (×2): qty 50

## 2015-01-09 MED ORDER — FUROSEMIDE 10 MG/ML IJ SOLN
20.0000 mg | Freq: Once | INTRAMUSCULAR | Status: AC
  Administered 2015-01-09: 20 mg via INTRAVENOUS
  Filled 2015-01-09: qty 2

## 2015-01-09 NOTE — Progress Notes (Signed)
ANTIBIOTIC CONSULT NOTE - FOLLOW UP  Pharmacy Consult for ceftriaxone Indication: UTI  No Known Allergies  Patient Measurements: Height: 5\' 4"  (162.6 cm) Weight: 151 lb 14.4 oz (68.901 kg) IBW/kg (Calculated) : 54.7  Vital Signs: Temp: 97.8 F (36.6 C) (03/27 0813) Temp Source: Oral (03/27 0813) BP: 126/61 mmHg (03/27 0813) Pulse Rate: 72 (03/27 0813) Intake/Output from previous day: 03/26 0701 - 03/27 0700 In: 4400 [I.V.:4200; IV Piggyback:200] Out: 350 [Urine:350] Intake/Output from this shift:    Labs:  Recent Labs  01/08/15 0641 01/09/15 0305  WBC 13.0* 7.9  HGB 10.4* 9.7*  PLT 124* 145*  CREATININE 1.16* 0.82   Estimated Creatinine Clearance: 60.9 mL/min (by C-G formula based on Cr of 0.82). No results for input(s): VANCOTROUGH, VANCOPEAK, VANCORANDOM, GENTTROUGH, GENTPEAK, GENTRANDOM, TOBRATROUGH, TOBRAPEAK, TOBRARND, AMIKACINPEAK, AMIKACINTROU, AMIKACIN in the last 72 hours.   Microbiology: Recent Results (from the past 720 hour(s))  Blood Culture (routine x 2)     Status: None (Preliminary result)   Collection Time: 01/08/15  7:57 AM  Result Value Ref Range Status   Specimen Description BLOOD RIGHT HAND  Final   Special Requests BOTTLES DRAWN AEROBIC AND ANAEROBIC B 4CC R 3CC  Final   Culture   Final           BLOOD CULTURE RECEIVED NO GROWTH TO DATE CULTURE WILL BE HELD FOR 5 DAYS BEFORE ISSUING A FINAL NEGATIVE REPORT Performed at [facility]    Report Status PENDING  Incomplete  Blood Culture (routine x 2)     Status: None (Preliminary result)   Collection Time: 01/08/15  8:21 AM  Result Value Ref Range Status   Specimen Description BLOOD RIGHT ARM  Final   Special Requests BOTTLES DRAWN AEROBIC ONLY 5CC  Final   Culture   Final           BLOOD CULTURE RECEIVED NO GROWTH TO DATE CULTURE WILL BE HELD FOR 5 DAYS BEFORE ISSUING A FINAL NEGATIVE REPORT Performed at [facility]    Report Status PENDING  Incomplete  Urine culture      Status: None   Collection Time: 01/08/15  9:15 AM  Result Value Ref Range Status   Specimen Description URINE, CLEAN CATCH  Final   Special Requests NONE  Final   Colony Count NO GROWTH Performed at [facility]   Final   Culture NO GROWTH Performed at [facility]   Final   Report Status 01/09/2015 FINAL  Final  MRSA PCR Screening     Status: None   Collection Time: 01/08/15  4:22 PM  Result Value Ref Range Status   MRSA by PCR NEGATIVE NEGATIVE Final    Comment:        The GeneXpert MRSA Assay (FDA approved for NASAL specimens only), is one component of a comprehensive MRSA colonization surveillance program. It is not intended to diagnose MRSA infection nor to guide or monitor treatment for MRSA infections.     Anti-infectives    Start     Dose/Rate Route Frequency Ordered Stop   01/09/15 0945  cefTRIAXone (ROCEPHIN) 1 g in dextrose 5 % 50 mL IVPB - Premix     1 g 100 mL/hr over 30 Minutes Intravenous Every 24 hours 01/09/15 0936     01/08/15 2200  vancomycin (VANCOCIN) 500 mg in sodium chloride 0.9 % 100 mL IVPB  Status:  Discontinued     500 mg 100 mL/hr over 60 Minutes Intravenous Every 12 hours  01/08/15 0754 01/09/15 0924   01/08/15 1600  piperacillin-tazobactam (ZOSYN) IVPB 3.375 g  Status:  Discontinued     3.375 g 12.5 mL/hr over 240 Minutes Intravenous Every 8 hours 01/08/15 0754 01/09/15 0924   01/08/15 0745  piperacillin-tazobactam (ZOSYN) IVPB 3.375 g     3.375 g 100 mL/hr over 30 Minutes Intravenous  Once 01/08/15 0738 01/08/15 0911   01/08/15 0745  vancomycin (VANCOCIN) IVPB 1000 mg/200 mL premix     1,000 mg 200 mL/hr over 60 Minutes Intravenous  Once 01/08/15 7614 01/08/15 1027      Assessment: 69 yo f admitted on 3/26 after passing out at home.  Pharmacy was initially consulted to dose vancomycin and zosyn for presumed sepsis, but those were discontinued and pharmacy is now consulted to dose ceftriaxone for a UTI (dirty UA).  SCr is  0.82, wbc wnl, Tm 99.1.    Vancomycin 3/26 >> 3/27 Zosyn 3/26 >> 3/27 Ceftriaxone 3/27 >>  3/26 BCx: ngtd 3/26 UCx: negative MRSA PCR negative  Goal of Therapy:  Eradication of infection  Plan:  Ceftriaxone 1 gm IV q24h Monitor cultures, CBC, fever curve, clinical course  Cassie L. Nicole Kindred, PharmD Clinical Pharmacy Resident Pager: [phone] 01/09/2015 9:40 AM

## 2015-01-09 NOTE — Progress Notes (Signed)
CRITICAL VALUE ALERT  Critical value received:  Troponin 0.61  Date of notification:  01/09/15  Time of notification:  1140  Critical value read back: yes  Nurse who received alert:  Leary Roca, RN  MD notified (1st page):  Dr. Philis Pique  Time of first page:  1143  MD notified (2nd page):  Time of second page:  Responding MD: Dr. Philis Pique  Time MD responded:  [phone]

## 2015-01-09 NOTE — Consult Note (Signed)
CONSULTATION NOTE  Reason for Consult: Syncope, elevated troponin  Requesting Physician: Dr. Joseph [name]  Cardiologist: None (NEW)  HPI: [name] with a past medical history significant for type 1 diabetes on insulin pump, hypertension, dyslipidemia, hypothyroidism, who presented to the ER with complaints of dizziness, lightheadedness and syncope. She was hypotensive on admission with blood pressure 62/48 that seem to improve with IV fluids. She was also bradycardic. In addition she was hypothermic and placed on sepsis protocol. She received broad-spectrum antibiotics. There is no initial complaint of chest pain, however she did have nausea, dizziness and diarrhea. EKG shows nonspecific changes and poor R-wave progression. Initial troponin was negative however subsequent troponin was elevated 0.61. An echocardiogram was ordered but not yet obtained. Cardiologist consult regarding syncope, hypotension and elevated troponin.  PMHx:  Past Medical History  Diagnosis Date  . Ovarian cyst   . Hypertension   . Insulin pump in place   . 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 Diabetes mellitus     Insulin pump  . Anxiety     new dx  . Hyperlipidemia   . S/P radiation therapy 04/10/12- 05/26/2012    left Breast and Axilla / 50 gy / 25 Fractions with Left Breast Boost / 10 Gy / 5 Fractions  . Use of letrozole (Femara) 06/13/12   Past Surgical History  Procedure Laterality Date  . Tonsillectomy    . Ovarian cyst surgery    . Colonoscopy      1 polyp   . 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 Appendectomy      FAMHx: Family History  Problem Relation Age of Onset  . Breast cancer Mother 64  . Prostate cancer Maternal Uncle    died in his 95s    SOCHx:  reports that she has never smoked. She does not have any smokeless tobacco history on file. She reports that she does not drink alcohol or use illicit drugs.  ALLERGIES: No Known Allergies  ROS: A comprehensive review of systems was negative except for: Respiratory: positive for dyspnea on exertion  HOME MEDICATIONS: Prescriptions prior to admission  Medication Sig Dispense Refill Last Dose  . Ascorbic Acid (VITAMIN C) 1000 MG tablet Take 8,000 mg by mouth daily.   01/07/2015 at Unknown time  . aspirin 81 MG tablet Take 81 mg by mouth daily.   01/07/2015 at Unknown time  . BENICAR 20 MG tablet TAKE 1 TABLET BY MOUTH EVERY DAY 30 tablet 5 01/07/2015 at Unknown time  . Betaine HCl 300 MG TABS Take 600 mg by mouth every 3 (three) days.    01/07/2015 at Unknown time  . Cholecalciferol (VITAMIN D-3) 5000 UNITS TABS Take 5,000 Units by mouth daily.    01/07/2015 at Unknown time  . co-enzyme Q-10 50 MG capsule Take 200 mg by mouth daily.    01/07/2015 at Unknown time  . Cod Liver Oil 1000 MG CAPS Take by mouth. 4 tablets daily   01/07/2015 at Unknown time  . cyanocobalamin 1000 MCG tablet Take 1,000 mcg by mouth daily.    01/07/2015 at Unknown time  . glucose 4 GM chewable tablet Chew 16 g by mouth as needed.   01/07/2015 at Unknown time  . insulin  lispro (HUMALOG) 100 UNIT/ML injection Basal rate 0.9 7am-12, 0.8 Noon-7am. Max of 60 units SQ every day with pump mer MD DX CODE: E10.65 (Patient taking differently: Insulin pump - Basal rate from 12 am-4:59 am at 0.45 units/hr, 5 am-6:59 am at 0.9 units/hr, 7 am-9:59 am at 1 unit/hr, 10 am-11:59 am at 1.1 units/hr, 12 pm-3:59 pm at 0.9 units/hr, 4 pm-8:59 at 0.8 units/hr, 9 pm-11:59 pm at 0.75 units/hr. Max of 100 units SQ every day; max of 50 units SQ in a 2 hr time frame with pump mer MD DX CODE: E10.65) 20 mL 6 01/08/2015 at Unknown time  . MAGNESIUM GLYCINATE PLUS PO Take 400 mg by mouth daily.   01/07/2015 at Unknown time  .  Methylsulfonylmethane (MSM PO) Take 1,000 each by mouth daily.    01/07/2015 at Unknown time  . Misc Natural Products (PROGESTERONE EX) Apply 1 application topically daily. Apply 4 clicks to chest daily.   01/07/2015 at Unknown time  . NALTREXONE HCL PO Take 4.5 mg by mouth.   01/07/2015 at Unknown time  . naproxen sodium (ANAPROX) 220 MG tablet Take 220 mg by mouth 2 (two) times daily as needed (pain).   Past Month at Unknown time  . OVER THE COUNTER MEDICATION Take 12.5 mg by mouth daily. Iodoral   01/07/2015 at Unknown time  . OVER THE COUNTER MEDICATION Take 1,000 mg by mouth daily. Calcium supplement   01/07/2015 at Unknown time  . OVER THE COUNTER MEDICATION Take 15-30 mLs by mouth daily. Take 1-2 heaping tablespoons of diatomaceous earth by mouth daily   01/07/2015 at Unknown time  . OVER THE COUNTER MEDICATION Take 100 mg by mouth daily. DIM (Diindolylmethane) - hormone supplement   01/07/2015 at Unknown time  . OVER THE COUNTER MEDICATION Take 1,000 mg by mouth daily. AHCC (active hexose correlated compound)   01/07/2015 at Unknown time  . Red Yeast Rice 600 MG TABS Take 1 tablet by mouth 4 (four) times daily.   01/07/2015 at Unknown time  . Resveratrol 250 MG CAPS Take 250 mg by mouth daily.   01/07/2015 at Unknown time  . thyroid (ARMOUR) 30 MG tablet Take 30 mg by mouth daily before breakfast. Takes 1 tablet every other day and 2 tablets alternating days   01/07/2015 at Unknown time  . irbesartan (AVAPRO) 150 MG tablet Take 1 tablet (150 mg total) by mouth daily. (Patient not taking: Reported on 01/08/2015) 30 tablet 3 Not Taking at Unknown time    HOSPITAL MEDICATIONS: Prior to Admission:  Prescriptions prior to admission  Medication Sig Dispense Refill Last Dose  . Ascorbic Acid (VITAMIN C) 1000 MG tablet Take 8,000 mg by mouth daily.   01/07/2015 at Unknown time  . aspirin 81 MG tablet Take 81 mg by mouth daily.   01/07/2015 at Unknown time  . BENICAR 20 MG tablet TAKE 1 TABLET BY MOUTH EVERY  DAY 30 tablet 5 01/07/2015 at Unknown time  . Betaine HCl 300 MG TABS Take 600 mg by mouth every 3 (three) days.    01/07/2015 at Unknown time  . Cholecalciferol (VITAMIN D-3) 5000 UNITS TABS Take 5,000 Units by mouth daily.    01/07/2015 at Unknown time  . co-enzyme Q-10 50 MG capsule Take 200 mg by mouth daily.    01/07/2015 at Unknown time  . Cod Liver Oil 1000 MG CAPS Take by mouth. 4 tablets daily   01/07/2015 at Unknown time  . cyanocobalamin 1000 MCG tablet Take 1,000 mcg  by mouth daily.    01/07/2015 at Unknown time  . glucose 4 GM chewable tablet Chew 16 g by mouth as needed.   01/07/2015 at Unknown time  . insulin lispro (HUMALOG) 100 UNIT/ML injection Basal rate 0.9 7am-12, 0.8 Noon-7am. Max of 60 units SQ every day with pump mer MD DX CODE: E10.65 (Patient taking differently: Insulin pump - Basal rate from 12 am-4:59 am at 0.45 units/hr, 5 am-6:59 am at 0.9 units/hr, 7 am-9:59 am at 1 unit/hr, 10 am-11:59 am at 1.1 units/hr, 12 pm-3:59 pm at 0.9 units/hr, 4 pm-8:59 at 0.8 units/hr, 9 pm-11:59 pm at 0.75 units/hr. Max of 100 units SQ every day; max of 50 units SQ in a 2 hr time frame with pump mer MD DX CODE: E10.65) 20 mL 6 01/08/2015 at Unknown time  . MAGNESIUM GLYCINATE PLUS PO Take 400 mg by mouth daily.   01/07/2015 at Unknown time  . Methylsulfonylmethane (MSM PO) Take 1,000 each by mouth daily.    01/07/2015 at Unknown time  . Misc Natural Products (PROGESTERONE EX) Apply 1 application topically daily. Apply 4 clicks to chest daily.   01/07/2015 at Unknown time  . NALTREXONE HCL PO Take 4.5 mg by mouth.   01/07/2015 at Unknown time  . naproxen sodium (ANAPROX) 220 MG tablet Take 220 mg by mouth 2 (two) times daily as needed (pain).   Past Month at Unknown time  . OVER THE COUNTER MEDICATION Take 12.5 mg by mouth daily. Iodoral   01/07/2015 at Unknown time  . OVER THE COUNTER MEDICATION Take 1,000 mg by mouth daily. Calcium supplement   01/07/2015 at Unknown time  . OVER THE COUNTER MEDICATION  Take 15-30 mLs by mouth daily. Take 1-2 heaping tablespoons of diatomaceous earth by mouth daily   01/07/2015 at Unknown time  . OVER THE COUNTER MEDICATION Take 100 mg by mouth daily. DIM (Diindolylmethane) - hormone supplement   01/07/2015 at Unknown time  . OVER THE COUNTER MEDICATION Take 1,000 mg by mouth daily. AHCC (active hexose correlated compound)   01/07/2015 at Unknown time  . Red Yeast Rice 600 MG TABS Take 1 tablet by mouth 4 (four) times daily.   01/07/2015 at Unknown time  . Resveratrol 250 MG CAPS Take 250 mg by mouth daily.   01/07/2015 at Unknown time  . thyroid (ARMOUR) 30 MG tablet Take 30 mg by mouth daily before breakfast. Takes 1 tablet every other day and 2 tablets alternating days   01/07/2015 at Unknown time  . irbesartan (AVAPRO) 150 MG tablet Take 1 tablet (150 mg total) by mouth daily. (Patient not taking: Reported on 01/08/2015) 30 tablet 3 Not Taking at Unknown time    VITALS: Blood pressure 130/62, pulse 98, temperature 98.3 F (36.8 C), temperature source Oral, resp. rate 24, height $RemoveBe'5\' 4"'[id number]$  (1.626 m), weight 151 lb 14.4 oz (68.901 kg), SpO2 100 %.  PHYSICAL EXAM: General appearance: alert and no distress Neck: no carotid bruit and no JVD Lungs: clear to auscultation bilaterally Heart: regular rate and rhythm, S1, S2 normal, no murmur, click, rub or gallop Abdomen: soft, non-tender; bowel sounds normal; no masses,  no organomegaly Extremities: extremities normal, atraumatic, no cyanosis or edema Pulses: 2+ and symmetric Skin: Skin color, texture, turgor normal. No rashes or lesions Neurologic: Grossly normal Psych: Pleasant  LABS: Results for orders placed or performed during the hospital encounter of 01/08/15 (from the past 48 hour(s))  CBG monitoring, ED     Status: Abnormal   Collection  Time: 01/08/15  5:55 AM  Result Value Ref Range   Glucose-Capillary 199 (H) 70 - 99 mg/dL  Comprehensive metabolic panel     Status: Abnormal   Collection Time: 01/08/15   6:41 AM  Result Value Ref Range   Sodium 137 135 - 145 mmol/L   Potassium 4.2 3.5 - 5.1 mmol/L   Chloride 103 96 - 112 mmol/L   CO2 23 19 - 32 mmol/L   Glucose, Bld 221 (H) 70 - 99 mg/dL   BUN 15 6 - 23 mg/dL   Creatinine, Ser 1.16 (H) 0.50 - 1.10 mg/dL   Calcium 8.3 (L) 8.4 - 10.5 mg/dL   Total Protein 5.2 (L) 6.0 - 8.3 g/dL   Albumin 3.0 (L) 3.5 - 5.2 g/dL   AST 65 (H) 0 - 37 U/L   ALT 41 (H) 0 - 35 U/L   Alkaline Phosphatase 71 39 - 117 U/L   Total Bilirubin 0.6 0.3 - 1.2 mg/dL   GFR calc non Af Amer 47 (L) >90 mL/min   GFR calc Af Amer 54 (L) >90 mL/min    Comment: (NOTE) The eGFR has been calculated using the CKD EPI equation. This calculation has not been validated in all clinical situations. eGFR's persistently <90 mL/min signify possible Chronic Kidney Disease.    Anion gap 11 5 - 15  Troponin I     Status: None   Collection Time: 01/08/15  6:41 AM  Result Value Ref Range   Troponin I <0.03 <0.031 ng/mL    Comment:        NO INDICATION OF MYOCARDIAL INJURY.   CBC with Differential     Status: Abnormal   Collection Time: 01/08/15  6:41 AM  Result Value Ref Range   WBC 13.0 (H) 4.0 - 10.5 K/uL   RBC 3.53 (L) 3.87 - 5.11 MIL/uL   Hemoglobin 10.4 (L) 12.0 - 15.0 g/dL   HCT 32.2 (L) 36.0 - 46.0 %   MCV 91.2 78.0 - 100.0 fL   MCH 29.5 26.0 - 34.0 pg   MCHC 32.3 30.0 - 36.0 g/dL   RDW 13.6 11.5 - 15.5 %   Platelets 124 (L) 150 - 400 K/uL   Neutrophils Relative % 89 (H) 43 - 77 %   Lymphocytes Relative 4 (L) 12 - 46 %   Monocytes Relative 7 3 - 12 %   Eosinophils Relative 0 0 - 5 %   Basophils Relative 0 0 - 1 %   Neutro Abs 11.6 (H) 1.7 - 7.7 K/uL   Lymphs Abs 0.5 (L) 0.7 - 4.0 K/uL   Monocytes Absolute 0.9 0.1 - 1.0 K/uL   Eosinophils Absolute 0.0 0.0 - 0.7 K/uL   Basophils Absolute 0.0 0.0 - 0.1 K/uL   RBC Morphology BURR CELLS    WBC Morphology VACUOLATED NEUTROPHILS   I-Stat CG4 Lactic Acid, ED     Status: Abnormal   Collection Time: 01/08/15  6:46 AM    Result Value Ref Range   Lactic Acid, Venous 3.84 (HH) 0.5 - 2.0 mmol/L   Comment NOTIFIED PHYSICIAN   Blood Culture (routine x 2)     Status: None (Preliminary result)   Collection Time: 01/08/15  7:57 AM  Result Value Ref Range   Specimen Description BLOOD RIGHT HAND    Special Requests BOTTLES DRAWN AEROBIC AND ANAEROBIC B 4CC R 3CC    Culture             BLOOD CULTURE RECEIVED NO GROWTH TO  DATE CULTURE WILL BE HELD FOR 5 DAYS BEFORE ISSUING A FINAL NEGATIVE REPORT Performed at [facility]    Report Status PENDING   I-Stat CG4 Lactic Acid, ED     Status: Abnormal   Collection Time: 01/08/15  8:08 AM  Result Value Ref Range   Lactic Acid, Venous 2.79 (HH) 0.5 - 2.0 mmol/L   Comment NOTIFIED PHYSICIAN   Blood Culture (routine x 2)     Status: None (Preliminary result)   Collection Time: 01/08/15  8:21 AM  Result Value Ref Range   Specimen Description BLOOD RIGHT ARM    Special Requests BOTTLES DRAWN AEROBIC ONLY 5CC    Culture             BLOOD CULTURE RECEIVED NO GROWTH TO DATE CULTURE WILL BE HELD FOR 5 DAYS BEFORE ISSUING A FINAL NEGATIVE REPORT Performed at [facility]    Report Status PENDING   Urinalysis, Routine w reflex microscopic     Status: Abnormal   Collection Time: 01/08/15  9:15 AM  Result Value Ref Range   Color, Urine AMBER (A) YELLOW    Comment: BIOCHEMICALS MAY BE AFFECTED BY COLOR   APPearance CLOUDY (A) CLEAR   Specific Gravity, Urine 1.021 1.005 - 1.030   pH 5.5 5.0 - 8.0   Glucose, UA NEGATIVE NEGATIVE mg/dL   Hgb urine dipstick NEGATIVE NEGATIVE   Bilirubin Urine NEGATIVE NEGATIVE   Ketones, ur >80 (A) NEGATIVE mg/dL   Protein, ur 100 (A) NEGATIVE mg/dL   Urobilinogen, UA 0.2 0.0 - 1.0 mg/dL   Nitrite NEGATIVE NEGATIVE   Leukocytes, UA TRACE (A) NEGATIVE  Urine culture     Status: None   Collection Time: 01/08/15  9:15 AM  Result Value Ref Range   Specimen Description URINE, CLEAN CATCH    Special Requests NONE    Colony  Count NO GROWTH Performed at [facility]     Culture NO GROWTH Performed at [facility]     Report Status 01/09/2015 FINAL   Urine microscopic-add on     Status: Abnormal   Collection Time: 01/08/15  9:15 AM  Result Value Ref Range   Squamous Epithelial / LPF RARE RARE   WBC, UA 3-6 <3 WBC/hpf   RBC / HPF 0-2 <3 RBC/hpf   Bacteria, UA MANY (A) RARE   Casts HYALINE CASTS (A) NEGATIVE    Comment: GRANULAR CAST   Urine-Other MUCOUS PRESENT   CBG monitoring, ED     Status: Abnormal   Collection Time: 01/08/15 10:52 AM  Result Value Ref Range   Glucose-Capillary 223 (H) 70 - 99 mg/dL  I-Stat CG4 Lactic Acid, ED     Status: None   Collection Time: 01/08/15 11:11 AM  Result Value Ref Range   Lactic Acid, Venous 1.22 0.5 - 2.0 mmol/L  Influenza panel by PCR (type A & B, H1N1)     Status: None   Collection Time: 01/08/15 12:05 PM  Result Value Ref Range   Influenza A By PCR NEGATIVE NEGATIVE   Influenza B By PCR NEGATIVE NEGATIVE   H1N1 flu by pcr NOT DETECTED NOT DETECTED    Comment:        The Xpert Flu assay (FDA approved for nasal aspirates or washes and nasopharyngeal swab specimens), is intended as an aid in the diagnosis of influenza and should not be used as a sole basis for treatment.   MRSA PCR Screening     Status: None  Collection Time: 01/08/15  4:22 PM  Result Value Ref Range   MRSA by PCR NEGATIVE NEGATIVE    Comment:        The GeneXpert MRSA Assay (FDA approved for NASAL specimens only), is one component of a comprehensive MRSA colonization surveillance program. It is not intended to diagnose MRSA infection nor to guide or monitor treatment for MRSA infections.   Glucose, capillary     Status: Abnormal   Collection Time: 01/08/15  5:05 PM  Result Value Ref Range   Glucose-Capillary 113 (H) 70 - 99 mg/dL  Glucose, capillary     Status: Abnormal   Collection Time: 01/08/15 10:07 PM  Result Value Ref Range   Glucose-Capillary 139  (H) 70 - 99 mg/dL  Glucose, capillary     Status: None   Collection Time: 01/09/15  1:50 AM  Result Value Ref Range   Glucose-Capillary 99 70 - 99 mg/dL  Comprehensive metabolic panel     Status: Abnormal   Collection Time: 01/09/15  3:05 AM  Result Value Ref Range   Sodium 136 135 - 145 mmol/L   Potassium 3.4 (L) 3.5 - 5.1 mmol/L    Comment: DELTA CHECK NOTED   Chloride 107 96 - 112 mmol/L   CO2 22 19 - 32 mmol/L   Glucose, Bld 100 (H) 70 - 99 mg/dL   BUN 15 6 - 23 mg/dL   Creatinine, Ser 0.82 0.50 - 1.10 mg/dL   Calcium 7.6 (L) 8.4 - 10.5 mg/dL   Total Protein 4.9 (L) 6.0 - 8.3 g/dL   Albumin 2.6 (L) 3.5 - 5.2 g/dL   AST 51 (H) 0 - 37 U/L   ALT 42 (H) 0 - 35 U/L   Alkaline Phosphatase 55 39 - 117 U/L   Total Bilirubin 0.9 0.3 - 1.2 mg/dL   GFR calc non Af Amer 71 (L) >90 mL/min   GFR calc Af Amer 82 (L) >90 mL/min    Comment: (NOTE) The eGFR has been calculated using the CKD EPI equation. This calculation has not been validated in all clinical situations. eGFR's persistently <90 mL/min signify possible Chronic Kidney Disease.    Anion gap 7 5 - 15  CBC     Status: Abnormal   Collection Time: 01/09/15  3:05 AM  Result Value Ref Range   WBC 7.9 4.0 - 10.5 K/uL   RBC 3.22 (L) 3.87 - 5.11 MIL/uL   Hemoglobin 9.7 (L) 12.0 - 15.0 g/dL   HCT 29.1 (L) 36.0 - 46.0 %   MCV 90.4 78.0 - 100.0 fL   MCH 30.1 26.0 - 34.0 pg   MCHC 33.3 30.0 - 36.0 g/dL   RDW 14.0 11.5 - 15.5 %   Platelets 145 (L) 150 - 400 K/uL  Glucose, capillary     Status: None   Collection Time: 01/09/15  8:12 AM  Result Value Ref Range   Glucose-Capillary 91 70 - 99 mg/dL  TSH     Status: None   Collection Time: 01/09/15 10:20 AM  Result Value Ref Range   TSH 2.450 0.350 - 4.500 uIU/mL  Troponin I (q 6hr x 3)     Status: Abnormal   Collection Time: 01/09/15 10:20 AM  Result Value Ref Range   Troponin I 0.61 (HH) <0.031 ng/mL    Comment:        POSSIBLE MYOCARDIAL ISCHEMIA. SERIAL  TESTING RECOMMENDED. REPEATED TO VERIFY CRITICAL RESULT CALLED TO, READ BACK BY AND VERIFIED WITH: DOBSONSRN [place] [id number] MCCAULEG  Glucose, capillary     Status: Abnormal   Collection Time: 01/09/15 12:48 PM  Result Value Ref Range   Glucose-Capillary 126 (H) 70 - 99 mg/dL    IMAGING: X-ray Chest Pa And Lateral  01/09/2015   CLINICAL DATA:  Sepsis, breast cancer  EXAM: CHEST  2 VIEW  COMPARISON:  01/08/2015  FINDINGS: Cardiomediastinal silhouette is stable. Persistent mild interstitial prominence bilaterally. Mild interstitial edema cannot be excluded. Small left pleural effusion left basilar atelectasis or or infiltrate. No segmental infiltrates. Right lung is clear.  IMPRESSION: Mild interstitial prominence bilateral suspicious for mild interstitial edema. Small left pleural effusion with left basilar atelectasis or infiltrate. Right lung is clear.   Electronically Signed   By: Lahoma Crocker M.D.   On: 01/09/2015 12:33   Dg Chest Port 1 View  01/08/2015   CLINICAL DATA:  Dizziness and right arm tingling  EXAM: PORTABLE CHEST - 1 VIEW  COMPARISON:  None.  FINDINGS: There is a degree of underlying emphysematous change. There is generalized interstitial prominence without airspace consolidation. The heart is upper normal in size with mild pulmonary venous hypertension. No adenopathy. No bone lesions. There is calcification in the right carotid artery.  IMPRESSION: Findings felt to represent a degree of congestive heart failure superimposed on emphysematous change. No airspace consolidation. Calcification right carotid artery.   Electronically Signed   By: Lowella Grip III M.D.   On: 01/08/2015 08:41    HOSPITAL DIAGNOSES: Principal Problem:   Sepsis Active Problems:   Uncontrolled type 1 diabetes mellitus   Essential hypertension   Dyslipidemia   Hypothyroidism, acquired, autoimmune   Hypotension   Hypothermia   Diarrhea   Syncope and collapse   Dehydration   Tingling   Anemia    Thrombocytopenia   Sepsis due to urinary tract infection   Diabetes type 1, uncontrolled   Other specified hypothyroidism   Other specified hypotension   Bradycardia   IMPRESSION: 1. Septic shock - resolved 2. Bradycardia - resolved 3. Elevated troponin - suspect due to diffuse subendocardial ischemia (secondary to #1 and #2) versus possible ACS  RECOMMENDATION: 1. Gina [name] has an elevated troponin which was initially negative. This suggests that it could've been related to septic shock, bradycardia and diffuse subendocardial ischemia or possibly acute coronary syndrome. She does have multiple coronary risk factors. I would recommend checking an additional troponin. Her chest x-ray today shows worsening bilateral interstitial changes and small left pleural effusion. This could represent early pulmonary edema. I will discontinue her IV fluids and give single dose Lasix today. Plan to check a 2-D echocardiogram. She's not currently having any chest pain, therefore I would not recommend heparinization. We'll see what her troponin trend reveals. If her echo shows normal function without wall motion abnormalities, then I would recommend stress testing to rule out any high risk ischemia.  If her troponin comes back markedly elevated or there are new wall motion abnormalities or significant systolic dysfunction, she should undergo cardiac catheterization this week.  Thanks for the consultation.  Time Spent Directly with Patient: 45 minutes  Pixie Casino, MD, [facility] Attending Cardiologist [facility]  HILTY,Kenneth C 01/09/2015, 2:01 PM

## 2015-01-09 NOTE — Progress Notes (Addendum)
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 EMS initially found with blood pressure of 62/48 mmHg. Following 800 mL fluid bolus, blood pressures improved to 80/40, bradycardia in the 30s and patient was transported to ED where she remained hypotensive and was found to be hypothermic. She was assessed as sepsis and started on sepsis protocol. She has received 3 L of IV fluid bolus, IV vancomycin and Zosyn after cultures percent, chest x-ray is negative for pneumonia findings and urine microscopy is pending. Patient's blood pressures have improved to the 110s. Temperature has normalized. Patient states that her [name] and laying resolved over the last hour and a half (about the time that her blood pressure normalized). Hospitalist admission requested.  Patient states that she was in her usual state of health until she went to bed last night. She actually drove back from [place]. No sickly contacts reported. She woke up at about 2:30 AM to urinate and when she went back to her bed she noticed some tingling of the right hand. She checked her face and said a few words to make sure she wasn't having a stroke and did not notice any facial asymmetry or slurred speech. She did not want to wake up her husband and hence went and laid down on the couch when she noticed tingling now off both [name]. After a little while, she got up to use the restroom again and had diarrhea 1 without blood or mucus. On getting up, she felt dizzy and lightheaded and while walking back to the couch,  she passed out  and landed on the kitchen floor. She hurt her right knee which is not very painful. Duration of LOC unknown. On waking up, she felt nauseous and called her spouse. He noticed that she was pale but no other significant findings. EMS was activated. Patient denies headache, earache, sore throat, fevers, chills, dyspnea, chest pain or abdominal pain. She has mild chronic intermittent cough related to the season and allergies. She had nausea and nonbloody small emesis 1. She does not complain of dysuria or urinary frequency. No insect bites or open wounds.    HPI/Subjective: 3/20 7A/O 4, NAD, states last time she had syncopal event was ~20 years ago.   Assessment/Plan: Sepsis - Most likely source is urine - Urine many bacteria, C. difficile panel PCR (negative diarrhea since admission);influenza panel PCR. Negative - Follow repeat chest x-ray 3/27. - Initiated sepsis protocol on ED arrival and has thus far received 3 L of IV fluids.  - Improved with normalization of blood pressure and temperature.  UTI -DC vancomycin and Zosyn -Start ceftriaxone per pharmacy   Syncope and collapse - Most likely secondary to hypotension. - EMS did note bradycardia, therefore echocardiogram pending - Patient currently not orthostatic  -TSH pending -First troponin returned as 0.61 -HEART Score=7 -Cardiology consult    Uncontrolled type 1 diabetes mellitus - Continue home insulin pump regimen and monitor CBGs closely -A1c pending -Lipid panel pending   Essential hypertension - Came in with hypotension. Hold home antihypertensives.   Dyslipidemia - Apparently intolerant to a lot  of statins. -Lipid panel pending   Hypothyroidism, acquired, autoimmune - Continue thyroid Armour 30 mg daily  -TSH pending   Hypotension - Related to sepsis. Improved after IV fluid hydration.   Hypothermia - Likely secondary to sepsis physiology. Treat as above. - Temperature has normalized  after warming blanket.   Diarrhea - 1 episode. - No reported antibiotic exposure or hospitalization. - May be viral. No reported eating anything unusual or sickly contacts. - Check C. difficile panel PCR (currently unable to check secondary to no diarrhea since admission).   Dehydration - Continue normal saline 100 ml/hr.   Tingling - Of both [name]. Likely related to hypotension. - No other strokelike symptoms. No focal deficits on exam. - Resolved after blood pressures normalize. - Monitor closely. If has recurrent symptoms, then consider further evaluation.   Anemia - Follow CBCs.   Thrombocytopenia - No reported bleeding. Follow CBCs daily.  Transient bradycardia - Per EMS. - Resolved. Monitor on telemetry. - Check TSH.   Code Status: FULL Family Communication: Husband present at time of exam Disposition Plan: Completion of syncopal workup    Consultants: NA  Procedure/Significant Events:    Culture 3/26 blood right hand/arm NGTD 3/26 urine negative 3/26 MRSA by PCR negative 3/26 influenza A/B/H1N1 negative   Antibiotics: Zosyn 3/26>> stop 3/27 Vancomycin 3/26>> stopped 3/27 Ceftriaxone 3/27>>   DVT prophylaxis: Lovenox   Devices    LINES / TUBES:      Continuous Infusions: . sodium chloride 100 mL/hr at 01/08/15 2239    Objective: VITAL SIGNS: Temp: 97.8 F (36.6 C) (03/27 0813) Temp Source: Oral (03/27 0813) BP: 126/61 mmHg (03/27 0813) Pulse Rate: 72 (03/27 0813) SPO2; FIO2:   Intake/Output Summary (Last 24 hours) at 01/09/15 0849 Last data filed at 01/09/15 0600  Gross per 24 hour  Intake   1400 ml  Output    350 ml  Net   1050 ml     Exam: General: A/O 4, NAD, sitting comfortably eating breakfast No acute respiratory distress Lungs: Clear to auscultation bilaterally without wheezes or crackles Cardiovascular: Regular rate and rhythm without murmur gallop or rub normal S1 and S2 Abdomen: Nontender,  nondistended, soft, bowel sounds positive, no rebound, no ascites, no appreciable mass Extremities: No significant cyanosis, clubbing, +1 bilateral lower extremity edema     Data Reviewed: Basic Metabolic Panel:  Recent Labs Lab 01/08/15 0641 01/09/15 0305  NA 137 136  K 4.2 3.4*  CL 103 107  CO2 23 22  GLUCOSE 221* 100*  BUN 15 15  CREATININE 1.16* 0.82  CALCIUM 8.3* 7.6*   Liver Function Tests:  Recent Labs Lab 01/08/15 0641 01/09/15 0305  AST 65* 51*  ALT 41* 42*  ALKPHOS 71 55  BILITOT 0.6 0.9  PROT 5.2* 4.9*  ALBUMIN 3.0* 2.6*   No results for input(s): LIPASE, AMYLASE in the last 168 hours. No results for input(s): AMMONIA in the last 168 hours. CBC:  Recent Labs Lab 01/08/15 0641 01/09/15 0305  WBC 13.0* 7.9  NEUTROABS 11.6*  --   HGB 10.4* 9.7*  HCT 32.2* 29.1*  MCV 91.2 90.4  PLT 124* 145*   Cardiac Enzymes:  Recent Labs Lab 01/08/15 0641  TROPONINI <0.03   BNP (last 3 results) No results for input(s): BNP in the last 8760 hours.  ProBNP (last 3 results) No results for input(s): PROBNP in the last 8760 hours.  CBG:  Recent Labs Lab 01/08/15 1052 01/08/15 1705 01/08/15 2207 01/09/15 0150 01/09/15 3810  GLUCAP 223* 113* 139* 99 91    Recent Results (from the past 240 hour(s))  Blood Culture (routine x 2)     Status: None (Preliminary result)   Collection Time: 01/08/15  7:57 AM  Result Value Ref Range Status   Specimen Description BLOOD RIGHT HAND  Final   Special Requests BOTTLES DRAWN AEROBIC AND ANAEROBIC B 4CC R 3CC  Final   Culture   Final           BLOOD CULTURE RECEIVED NO GROWTH TO DATE CULTURE WILL BE HELD FOR 5 DAYS BEFORE ISSUING A FINAL NEGATIVE REPORT Performed at [facility]    Report Status PENDING  Incomplete  Blood Culture (routine x 2)     Status: None (Preliminary result)   Collection Time: 01/08/15  8:21 AM  Result Value Ref Range Status   Specimen Description BLOOD RIGHT ARM  Final   Special  Requests BOTTLES DRAWN AEROBIC ONLY 5CC  Final   Culture   Final           BLOOD CULTURE RECEIVED NO GROWTH TO DATE CULTURE WILL BE HELD FOR 5 DAYS BEFORE ISSUING A FINAL NEGATIVE REPORT Performed at [facility]    Report Status PENDING  Incomplete  Urine culture     Status: None   Collection Time: 01/08/15  9:15 AM  Result Value Ref Range Status   Specimen Description URINE, CLEAN CATCH  Final   Special Requests NONE  Final   Colony Count NO GROWTH Performed at [facility]   Final   Culture NO GROWTH Performed at [facility]   Final   Report Status 01/09/2015 FINAL  Final  MRSA PCR Screening     Status: None   Collection Time: 01/08/15  4:22 PM  Result Value Ref Range Status   MRSA by PCR NEGATIVE NEGATIVE Final    Comment:        The GeneXpert MRSA Assay (FDA approved for NASAL specimens only), is one component of a comprehensive MRSA colonization surveillance program. It is not intended to diagnose MRSA infection nor to guide or monitor treatment for MRSA infections.      Studies:  Recent x-ray studies have been reviewed in detail by the Attending Physician  Scheduled Meds:  Scheduled Meds: . aspirin EC  81 mg Oral Daily  . enoxaparin (LOVENOX) injection  40 mg Subcutaneous Q24H  . famotidine (PEPCID) IV  20 mg Intravenous Q12H  . insulin pump   Subcutaneous TID AC, HS, 0200  . NONFORMULARY OR COMPOUNDED ITEM   Per post-pyloric tube Daily  . piperacillin-tazobactam (ZOSYN)  IV  3.375 g Intravenous Q8H  . sodium chloride  3 mL Intravenous Q12H  . thyroid  30 mg Oral QAC breakfast  . vancomycin  500 mg Intravenous Q12H    Time spent on care of this patient: 40 mins   Marrion Accomando, Geraldo Docker , MD  [facility] Office  [phone] Pager - [phone]  On-Call/Text Page:      [email or web address]      password [email or web address]  If 7PM-7AM, please contact night-coverage [email or web address] Password [facility] 01/09/2015, 8:49 AM   LOS: 1 day   Care during  the described time interval was provided by me .  I have reviewed this patient's available data, including medical history, events of note, physical examination, radiology studies and test results as part of my evaluation  Dia Crawford, MD [phone] Pager

## 2015-01-10 DIAGNOSIS — R55 Syncope and collapse: Secondary | ICD-10-CM

## 2015-01-10 DIAGNOSIS — E860 Dehydration: Secondary | ICD-10-CM

## 2015-01-10 LAB — GLUCOSE, CAPILLARY
Glucose-Capillary: 74 mg/dL (ref 70–99)
Glucose-Capillary: 222 mg/dL — ABNORMAL HIGH (ref 70–99)
Glucose-Capillary: 105 mg/dL — ABNORMAL HIGH (ref 70–99)

## 2015-01-10 LAB — HEMOGLOBIN A1C
Hgb A1c MFr Bld: 7.2 % — ABNORMAL HIGH (ref 4.8–5.6)
Mean Plasma Glucose: 160 mg/dL

## 2015-01-10 MED ORDER — POTASSIUM CHLORIDE CRYS ER 20 MEQ PO TBCR
40.0000 meq | EXTENDED_RELEASE_TABLET | Freq: Once | ORAL | Status: AC
  Administered 2015-01-10: 40 meq via ORAL
  Filled 2015-01-10: qty 2

## 2015-01-10 MED ORDER — RISAQUAD PO CAPS: 1.0000 | ORAL_CAPSULE | Freq: Every day | ORAL | Status: DC

## 2015-01-10 MED ORDER — RISAQUAD PO CAPS
1.0000 | ORAL_CAPSULE | Freq: Every day | ORAL | Status: DC
  Administered 2015-01-10: 1 via ORAL
  Filled 2015-01-10: qty 1

## 2015-01-10 NOTE — Progress Notes (Signed)
Patient Name: Gina [name] Date of Encounter: 01/10/2015  Principal Problem:   Sepsis Active Problems:   Uncontrolled type 1 diabetes mellitus   Essential hypertension   Dyslipidemia   Hypothyroidism, acquired, autoimmune   Hypotension   Hypothermia   Diarrhea   Syncope and collapse   Dehydration   Tingling   Anemia   Thrombocytopenia   Sepsis due to urinary tract infection   Diabetes type 1, uncontrolled   Other specified hypothyroidism   Other specified hypotension   Bradycardia   Elevated troponin   Primary Cardiologist: Dr. Debara Pickett (new)  Patient Profile: 69 yo female w/ DM type 1, HTN, HL, hypothyroid, who was admitted 03/26 w/ syncope. Cards saw for elevated troponin  SUBJECTIVE: Has never had chest pain, no SOB. No palpitations. HgA1c usually in the low 7s. BP very low on admission, better now.  OBJECTIVE Filed Vitals:   01/09/15 2048 01/10/15 0012 01/10/15 0456 01/10/15 0751  BP: 132/60 122/47 142/64   Pulse: 82 81 81   Temp: 98.1 F (36.7 C) 97.6 F (36.4 C) 98 F (36.7 C) 98 F (36.7 C)  TempSrc: Oral Oral Oral Oral  Resp: 18 16 17    Height:      Weight:   153 lb 3.5 oz (69.5 kg)   SpO2: 99% 100% 100%     Intake/Output Summary (Last 24 hours) at 01/10/15 1058 Last data filed at 01/09/15 1800  Gross per 24 hour  Intake    340 ml  Output      2 ml  Net    338 ml   Filed Weights   01/08/15 1607 01/09/15 0500 01/10/15 0456  Weight: 149 lb 12.8 oz (67.949 kg) 151 lb 14.4 oz (68.901 kg) 153 lb 3.5 oz (69.5 kg)    PHYSICAL EXAM General: Well developed, well nourished, female in no acute distress. Head: Normocephalic, atraumatic.  Neck: Supple without bruits, JVD not elevated. Lungs:  Resp regular and unlabored, CTA. Heart: RRR, S1, S2, no S3, S4, or murmur; no rub. Abdomen: Soft, non-tender, non-distended, BS + x 4.  Extremities: No clubbing, cyanosis, no edema.  Neuro: Alert and oriented X 3. Moves all extremities spontaneously. Psych:  Normal affect.  LABS: CBC: Recent Labs  01/08/15 0641 01/09/15 0305  WBC 13.0* 7.9  NEUTROABS 11.6*  --   HGB 10.4* 9.7*  HCT 32.2* 29.1*  MCV 91.2 90.4  PLT 124* 174*   Basic Metabolic Panel: Recent Labs  01/08/15 0641 01/09/15 0305  NA 137 136  K 4.2 3.4*  CL 103 107  CO2 23 22  GLUCOSE 221* 100*  BUN 15 15  CREATININE 1.16* 0.82  CALCIUM 8.3* 7.6*   Liver Function Tests: Recent Labs  01/08/15 0641 01/09/15 0305  AST 65* 51*  ALT 41* 42*  ALKPHOS 71 55  BILITOT 0.6 0.9  PROT 5.2* 4.9*  ALBUMIN 3.0* 2.6*   Cardiac Enzymes: Recent Labs  01/09/15 1020 01/09/15 1602 01/09/15 2100  TROPONINI 0.61* 0.82* 0.75*   Hemoglobin A1C: Recent Labs  01/09/15 1602  HGBA1C 7.2*   Fasting Lipid Panel: Recent Labs  01/09/15 1602  CHOL 206*  HDL 68  LDLCALC 130*  TRIG 40  CHOLHDL 3.0   Thyroid Function Tests: Recent Labs  01/09/15 1020  TSH 2.450   TELE:  SR, occ PACs      Radiology/Studies: X-ray Chest Pa And Lateral 01/09/2015   CLINICAL DATA:  Sepsis, breast cancer  EXAM: CHEST  2 VIEW  COMPARISON:  01/08/2015  FINDINGS: Cardiomediastinal silhouette is stable. Persistent mild interstitial prominence bilaterally. Mild interstitial edema cannot be excluded. Small left pleural effusion left basilar atelectasis or or infiltrate. No segmental infiltrates. Right lung is clear.  IMPRESSION: Mild interstitial prominence bilateral suspicious for mild interstitial edema. Small left pleural effusion with left basilar atelectasis or infiltrate. Right lung is clear.   Electronically Signed   By: Lahoma Crocker M.D.   On: 01/09/2015 12:33    Current Medications:  . aspirin EC  81 mg Oral Daily  . enoxaparin (LOVENOX) injection  40 mg Subcutaneous Q24H  . famotidine  20 mg Oral BID  . insulin pump   Subcutaneous TID AC, HS, 0200  . sodium chloride  3 mL Intravenous Q12H  . thyroid  30 mg Oral QAC breakfast      ASSESSMENT AND PLAN:   Bradycardia - HR 50s on  admission, SBP 60s - now improved, not on rate-lowering rx PTA    Elevated troponin - slight crescendo/decrescendo pattern - significance unclear in the setting of acute illness - 2d echo pending - pt asking about chance of cath, advised her only if WMA or low EF on echo. She is VERY reluctant to do this. Seems agreeable to stress test, especially as OP.  Otherwise, per IM Principal Problem:   Sepsis Active Problems:   Uncontrolled type 1 diabetes mellitus    Essential hypertension - SBP currently 120s-140s - SBP as low as 64 on admission    Dyslipidemia   Hypothyroidism, acquired, autoimmune   Hypotension   Hypothermia   Diarrhea   Syncope and collapse   Dehydration   Tingling   Anemia   Thrombocytopenia   Sepsis due to urinary tract infection   Diabetes type 1, uncontrolled   Other specified hypothyroidism   Other specified hypotension   Signed, Rosaria Ferries , PA-C 10:58 AM 01/10/2015 Patient seen and examined and history reviewed. Agree with above findings and plan. She is feeling very well today. BP and HR are now normal. Some loose stools. Troponin peak 0.82. Ecg is normal. Echo is normal. I think her troponin elevation is due to demand ischemia in the setting of hypotension. She does not have angina. I think further cardiac evaluation is warranted with a stress Myoview. She is wanting to go home today. If the medial service is going to keep her here today we can schedule Myoview here in am. If she is ready for DC we can arrange as outpatient.  Keiland Pickering [place], [place] 01/10/2015 2:04 PM

## 2015-01-10 NOTE — Progress Notes (Signed)
Discharged to front door of hospital with family. Pt refused wheelchair, ambulated without assistance.

## 2015-01-10 NOTE — Progress Notes (Signed)
Insulin pump settings Basal rates:  12 am  0.45 units/hr 05 am  0.9 units/hr 07 am  1.0 units/hr 10 am 1.1 units/hr 12 pm  0.9 units/hr 04 pm  0.8 units/hr 09 pm  0.75 units/hr  1 units per 8 gms of carbs Pt unfamiliar with her sensitivity/correction factor.  Thanks,  Tama Headings RN, MSN, [facility] Inpatient Diabetes Coordinator Team Pager [phone]

## 2015-01-10 NOTE — Progress Notes (Signed)
  Echocardiogram 2D Echocardiogram has been performed.  Gina [name] 01/10/2015, 12:26 PM

## 2015-01-10 NOTE — Discharge Summary (Signed)
DISCHARGE SUMMARY  Gina [name]  MR#: [id number]  DOB:[birth year]  Date of Admission: 01/08/2015 Date of Discharge: 01/10/2015  Attending Physician:MCCLUNG,JEFFREY T  Patient's YQI:[id number] TOM, MD  Consults: [facility]  Disposition: d/c home   Follow-up Appts:     Follow-up Information    Follow up with [facility] TOM, MD. Schedule an appointment as soon as possible for a visit in 1 week.   Specialty:  Family Medicine   Contact information:   [address] [place] [address] [phone]       Follow up with [facility].   Specialty:  Cardiology   Why:  Call to arrange for a stress test as discussed w/ Dr. Peter Martinique   Contact information:   [address] [place] [address] [phone]      Tests Needing Follow-up: -if diarrhea continues, testing for C diff and other potential causes for chronic intermittent diarrhea should be carried out -recheck of BP will be indicated to determine if resumption of BP meds indicated -pt will need an outpt evaluation for her anemia, if this has not been recently accomplished    Discharge Diagnoses: Severe Sepsis (hypotension) of unclear etiology  ?UTI Syncope and collapse Elevated troponin Type 1 diabetes mellitus Essential hypertension Dyslipidemia Hypothyroidism, acquired, autoimmune Diarrhea Tingling Anemia Tansient bradycardia  Initial presentation: 69 y.o.F w/ hx of type I DM on insulin pump, essential hypertension, hyperlipidemia, hypothyroid, and invasive ductal breast cancer ER/PR positive (S/P XRT) who presented to the [facility] ED on 01/08/15 with complaints of dizziness, lightheadedness, passing out 1, tingling of both hands, and diarrhea. EMS initially found a blood pressure of 62/48 mmHg (following 800 mL fluid bolus, blood pressures improved to 80/40) and bradycardia in the 30s.    The patient was transported to ED where she  remained hypotensive and was found to be hypothermic. She was started on sepsis protocol. She received 3 L of IV fluid bolus, IV vancomycin and Zosyn.  Chest x-ray was negative for pneumonia. Patient's blood pressures improved to the 110s. Temperature normalized. Patient's tingling resolved over an hour and a half (about the time that her blood pressure normalized).   Hospital Course:   Severe Sepsis (hypotension) of unclear etiology  - UA many bacteria w/ trace leuk esterase and 3-6WBC - not convincing for severe UTI/Pyelo  - C. difficile PCR ordered but not obtained as diarrhea initially resolved - influenza PCR negative - Improved with normalization of blood pressure and temperature - sepsis physiology has resolved    UTI -DC'd vancomycin and Zosyn -ceftriaxone x1 dose -w/ lack of urinary sx and stabilization, continuation of abx course not clearly indicated therefore abx stopped    Syncope and collapse - Most likely secondary to hypotension - EMS did note bradycardia, but this was transient and no arrhythmia noted during hospital stay  - no orthostatic sx at time of d/c     Elevated troponin likely due to demand ischemia - trending down at time of d/c - to f/u w/ [facility] as outpt to arrange for Myoview - no chest pain or sx concerning for USAP - was seen by Cards during hospital stay     Type 1 diabetes mellitus - Continue home insulin pump regimen and monitor CBGs closely - A1c 7.2 - to f/u w/ her Endocrinologist as previously scheduled    Essential hypertension - Came in with hypotension - stopped home antihypertensives   Dyslipidemia - Reportedly intolerant of  statins  - Lipid panel notes LDL 130   Hypothyroidism, acquired, autoimmune - Continue thyroid Armour 30 mg daily  -TSH 2.45   Diarrhea - No reported antibiotic exposure or hospitalizations prior to this admit  - No reported eating anything unusual or sickly contacts. - C. difficile PCR ordered, but  no recurrence of diarrhea during most of hospital stay therefore was not obtained - further hx reveals pt is prone to episodes of loose stools intermittently - she states this is not unlike those episodes - counseled pt that change in frequency or duration of loose stools would require further w/u - for now do not feel this is c/w C diff by hx - to f/u as outpt    Dehydration - resolved w/ volume expansion    Tingling - Of both hands, likely related to hypotension. - No other stroke like symptoms. No focal deficits on exam. - Resolved after blood pressures normalized.   Anemia - no evidence of acute blood loss - pt will need outpt eval, to include possible GI eval as indicated  Transient bradycardia - Per EMS. - Resolved     Medication List    STOP taking these medications        BENICAR 20 MG tablet  Generic drug:  olmesartan     irbesartan 150 MG tablet  Commonly known as:  AVAPRO      TAKE these medications        acidophilus Caps capsule  Take 1 capsule by mouth daily.     aspirin 81 MG tablet  Take 81 mg by mouth daily.     Betaine HCl 300 MG Tabs  Take 600 mg by mouth every 3 (three) days.     co-enzyme Q-10 50 MG capsule  Take 200 mg by mouth daily.     Cod Liver Oil 1000 MG Caps  Take by mouth. 4 tablets daily     cyanocobalamin 1000 MCG tablet  Take 1,000 mcg by mouth daily.     glucose 4 GM chewable tablet  Chew 16 g by mouth as needed.     insulin lispro 100 UNIT/ML injection  Commonly known as:  HUMALOG  Basal rate 0.9 7am-12, 0.8 Noon-7am. Max of 60 units SQ every day with pump mer MD DX CODE: E10.65     MAGNESIUM GLYCINATE PLUS PO  Take 400 mg by mouth daily.     MSM PO  Take 1,000 each by mouth daily.     NALTREXONE HCL PO  Take 4.5 mg by mouth.     naproxen sodium 220 MG tablet  Commonly known as:  ANAPROX  Take 220 mg by mouth 2 (two) times daily as needed (pain).     OVER THE COUNTER MEDICATION  Take 12.5 mg by mouth  daily. Iodoral     OVER THE COUNTER MEDICATION  Take 1,000 mg by mouth daily. Calcium supplement     OVER THE COUNTER MEDICATION  Take 15-30 mLs by mouth daily. Take 1-2 heaping tablespoons of diatomaceous earth by mouth daily     OVER THE COUNTER MEDICATION  Take 100 mg by mouth daily. DIM (Diindolylmethane) - hormone supplement     OVER THE COUNTER MEDICATION  Take 1,000 mg by mouth daily. AHCC (active hexose correlated compound)     PROGESTERONE EX  Apply 1 application topically daily. Apply 4 clicks to chest daily.     Red Yeast Rice 600 MG Tabs  Take 1 tablet by mouth 4 (four) times  daily.     Resveratrol 250 MG Caps  Take 250 mg by mouth daily.     thyroid 30 MG tablet  Commonly known as:  ARMOUR  Take 30 mg by mouth daily before breakfast. Takes 1 tablet every other day and 2 tablets alternating days     vitamin C 1000 MG tablet  Take 8,000 mg by mouth daily.     Vitamin D-3 5000 UNITS Tabs  Take 5,000 Units by mouth daily.       Day of Discharge BP 110/54 mmHg  Pulse 69  Temp(Src) 97.7 F (36.5 C) (Oral)  Resp 20  Ht 5\' 4"  (1.626 m)  Wt 69.5 kg (153 lb 3.5 oz)  BMI 26.29 kg/m2  SpO2 99%  Physical Exam: General: No acute respiratory distress Lungs: Clear to auscultation bilaterally without wheezes or crackles Cardiovascular: Regular rate and rhythm without murmur gallop or rub normal S1 and S2 Abdomen: Nontender, nondistended, soft, bowel sounds positive, no rebound, no ascites, no appreciable mass Extremities: No significant cyanosis, clubbing, edema bilateral lower extremities  Basic Metabolic Panel:  Recent Labs Lab 01/08/15 0641 01/09/15 0305  NA 137 136  K 4.2 3.4*  CL 103 107  CO2 23 22  GLUCOSE 221* 100*  BUN 15 15  CREATININE 1.16* 0.82  CALCIUM 8.3* 7.6*    Liver Function Tests:  Recent Labs Lab 01/08/15 0641 01/09/15 0305  AST 65* 51*  ALT 41* 42*  ALKPHOS 71 55  BILITOT 0.6 0.9  PROT 5.2* 4.9*  ALBUMIN 3.0* 2.6*    CBC:  Recent Labs Lab 01/08/15 0641 01/09/15 0305  WBC 13.0* 7.9  NEUTROABS 11.6*  --   HGB 10.4* 9.7*  HCT 32.2* 29.1*  MCV 91.2 90.4  PLT 124* 145*    Cardiac Enzymes:  Recent Labs Lab 01/08/15 0641 01/09/15 1020 01/09/15 1602 01/09/15 2100  TROPONINI <0.03 0.61* 0.82* 0.75*   CBG:  Recent Labs Lab 01/09/15 1609 01/09/15 2046 01/10/15 0313 01/10/15 0745 01/10/15 1356  GLUCAP 177* 93 74 222* 105*    Recent Results (from the past 240 hour(s))  Blood Culture (routine x 2)     Status: None (Preliminary result)   Collection Time: 01/08/15  7:57 AM  Result Value Ref Range Status   Specimen Description BLOOD RIGHT HAND  Final   Special Requests BOTTLES DRAWN AEROBIC AND ANAEROBIC B 4CC R 3CC  Final   Culture   Final           BLOOD CULTURE RECEIVED NO GROWTH TO DATE CULTURE WILL BE HELD FOR 5 DAYS BEFORE ISSUING A FINAL NEGATIVE REPORT Performed at [facility]    Report Status PENDING  Incomplete  Blood Culture (routine x 2)     Status: None (Preliminary result)   Collection Time: 01/08/15  8:21 AM  Result Value Ref Range Status   Specimen Description BLOOD RIGHT ARM  Final   Special Requests BOTTLES DRAWN AEROBIC ONLY 5CC  Final   Culture   Final           BLOOD CULTURE RECEIVED NO GROWTH TO DATE CULTURE WILL BE HELD FOR 5 DAYS BEFORE ISSUING A FINAL NEGATIVE REPORT Performed at [facility]    Report Status PENDING  Incomplete  Urine culture     Status: None   Collection Time: 01/08/15  9:15 AM  Result Value Ref Range Status   Specimen Description URINE, CLEAN CATCH  Final   Special Requests NONE  Final   Colony Count  NO GROWTH Performed at [facility]   Final   Culture NO GROWTH Performed at [facility]   Final   Report Status 01/09/2015 FINAL  Final  MRSA PCR Screening     Status: None   Collection Time: 01/08/15  4:22 PM  Result Value Ref Range Status   MRSA by PCR NEGATIVE NEGATIVE Final    Comment:         The GeneXpert MRSA Assay (FDA approved for NASAL specimens only), is one component of a comprehensive MRSA colonization surveillance program. It is not intended to diagnose MRSA infection nor to guide or monitor treatment for MRSA infections.       Time spent in discharge (includes decision making & examination of pt): >35 minutes  01/10/2015, 3:33 PM   Cherene Altes, MD [facility] Office  [phone] Pager [phone]  On-Call/Text Page:      [email or web address]      password [facility]

## 2015-01-10 NOTE — Discharge Instructions (Signed)
Syncope °Syncope is a medical term for fainting or passing out. This means you lose consciousness and drop to the ground. People are generally unconscious for less than 5 minutes. You may have some muscle twitches for up to 15 seconds before waking up and returning to normal. Syncope occurs more often in older adults, but it can happen to anyone. While most causes of syncope are not dangerous, syncope can be a sign of a serious medical problem. It is important to seek medical care.  °CAUSES  °Syncope is caused by a sudden drop in blood flow to the brain. The specific cause is often not determined. Factors that can bring on syncope include: °· Taking medicines that lower blood pressure. °· Sudden changes in posture, such as standing up quickly. °· Taking more medicine than prescribed. °· Standing in one place for too long. °· Seizure disorders. °· Dehydration and excessive exposure to heat. °· Low blood sugar (hypoglycemia). °· Straining to have a bowel movement. °· Heart disease, irregular heartbeat, or other circulatory problems. °· Fear, emotional distress, seeing blood, or severe pain. °SYMPTOMS  °Right before fainting, you may: °· Feel dizzy or light-headed. °· Feel nauseous. °· See all white or all black in your field of vision. °· Have cold, clammy skin. °DIAGNOSIS  °Your health care provider will ask about your symptoms, perform a physical exam, and perform an electrocardiogram (ECG) to record the electrical activity of your heart. Your health care provider may also perform other heart or blood tests to determine the cause of your syncope which may include: °· Transthoracic echocardiogram (TTE). During echocardiography, sound waves are used to evaluate how blood flows through your heart. °· Transesophageal echocardiogram (TEE). °· Cardiac monitoring. This allows your health care provider to monitor your heart rate and rhythm in real time. °· Holter monitor. This is a portable device that records your  heartbeat and can help diagnose heart arrhythmias. It allows your health care provider to track your heart activity for several days, if needed. °· Stress tests by exercise or by giving medicine that makes the heart beat faster. °TREATMENT  °In most cases, no treatment is needed. Depending on the cause of your syncope, your health care provider may recommend changing or stopping some of your medicines. °HOME CARE INSTRUCTIONS °· Have someone stay with you until you feel stable. °· Do not drive, use machinery, or play sports until your health care provider says it is okay. °· Keep all follow-up appointments as directed by your health care provider. °· Lie down right away if you start feeling like you might faint. Breathe deeply and steadily. Wait until all the symptoms have passed. °· Drink enough fluids to keep your urine clear or pale yellow. °· If you are taking blood pressure or heart medicine, get up slowly and take several minutes to sit and then stand. This can reduce dizziness. °SEEK IMMEDIATE MEDICAL CARE IF:  °· You have a severe headache. °· You have unusual pain in the chest, abdomen, or back. °· You are bleeding from your mouth or rectum, or you have black or tarry stool. °· You have an irregular or very fast heartbeat. °· You have pain with breathing. °· You have repeated fainting or seizure-like jerking during an episode. °· You faint when sitting or lying down. °· You have confusion. °· You have trouble walking. °· You have severe weakness. °· You have vision problems. °If you fainted, call your local emergency services (911 in [place] Do not drive   yourself to the hospital.  °MAKE SURE YOU: °· Understand these instructions. °· Will watch your condition. °· Will get help right away if you are not doing well or get worse. °Document Released: 10/01/2005 Document Revised: 10/06/2013 Document Reviewed: 11/30/2011 °[employer]® Patient Information ©2015 [employer]. This information is not intended to replace  advice given to you by your health care provider. Make sure you discuss any questions you have with your health care provider. ° °

## 2015-01-11 ENCOUNTER — Encounter (HOSPITAL_COMMUNITY): Payer: Self-pay | Admitting: Emergency Medicine

## 2015-01-11 ENCOUNTER — Ambulatory Visit (INDEPENDENT_AMBULATORY_CARE_PROVIDER_SITE_OTHER): Payer: Medicare Other | Admitting: Family Medicine

## 2015-01-11 ENCOUNTER — Encounter: Payer: Self-pay | Admitting: Family Medicine

## 2015-01-11 ENCOUNTER — Telehealth: Payer: Self-pay | Admitting: *Deleted

## 2015-01-11 ENCOUNTER — Emergency Department (HOSPITAL_COMMUNITY): Payer: Medicare Other

## 2015-01-11 ENCOUNTER — Observation Stay (HOSPITAL_BASED_OUTPATIENT_CLINIC_OR_DEPARTMENT_OTHER)
Admission: EM | Admit: 2015-01-11 | Discharge: 2015-01-12 | Disposition: A | Discharge status: Home / Self Care | Payer: Medicare Other | Source: Home / Self Care | Attending: Emergency Medicine | Admitting: Emergency Medicine

## 2015-01-11 VITALS — BP 88/54 | HR 80 | Temp 98.6°F | Resp 20

## 2015-01-11 DIAGNOSIS — E109 Type 1 diabetes mellitus without complications: Secondary | ICD-10-CM

## 2015-01-11 DIAGNOSIS — D649 Anemia, unspecified: Secondary | ICD-10-CM | POA: Diagnosis present

## 2015-01-11 DIAGNOSIS — E119 Type 2 diabetes mellitus without complications: Secondary | ICD-10-CM | POA: Diagnosis not present

## 2015-01-11 DIAGNOSIS — I10 Essential (primary) hypertension: Secondary | ICD-10-CM | POA: Diagnosis present

## 2015-01-11 DIAGNOSIS — R509 Fever, unspecified: Secondary | ICD-10-CM | POA: Diagnosis not present

## 2015-01-11 DIAGNOSIS — I5031 Acute diastolic (congestive) heart failure: Secondary | ICD-10-CM

## 2015-01-11 DIAGNOSIS — R7989 Other specified abnormal findings of blood chemistry: Secondary | ICD-10-CM

## 2015-01-11 DIAGNOSIS — A419 Sepsis, unspecified organism: Secondary | ICD-10-CM | POA: Diagnosis not present

## 2015-01-11 DIAGNOSIS — Z7901 Long term (current) use of anticoagulants: Secondary | ICD-10-CM

## 2015-01-11 DIAGNOSIS — R55 Syncope and collapse: Secondary | ICD-10-CM

## 2015-01-11 DIAGNOSIS — R778 Other specified abnormalities of plasma proteins: Secondary | ICD-10-CM | POA: Diagnosis present

## 2015-01-11 DIAGNOSIS — I9589 Other hypotension: Secondary | ICD-10-CM | POA: Diagnosis not present

## 2015-01-11 DIAGNOSIS — I4891 Unspecified atrial fibrillation: Secondary | ICD-10-CM | POA: Diagnosis present

## 2015-01-11 DIAGNOSIS — C50919 Malignant neoplasm of unspecified site of unspecified female breast: Secondary | ICD-10-CM | POA: Diagnosis present

## 2015-01-11 DIAGNOSIS — I959 Hypotension, unspecified: Secondary | ICD-10-CM

## 2015-01-11 DIAGNOSIS — I480 Paroxysmal atrial fibrillation: Secondary | ICD-10-CM | POA: Diagnosis not present

## 2015-01-11 DIAGNOSIS — E039 Hypothyroidism, unspecified: Secondary | ICD-10-CM | POA: Diagnosis present

## 2015-01-11 HISTORY — DX: Type 1 diabetes mellitus without complications: E10.9

## 2015-01-11 LAB — BASIC METABOLIC PANEL
Anion gap: 8 (ref 5–15)
BUN: 10 mg/dL (ref 6–23)
CO2: 24 mmol/L (ref 19–32)
CREATININE: 0.91 mg/dL (ref 0.50–1.10)
Calcium: 8.4 mg/dL (ref 8.4–10.5)
Chloride: 100 mmol/L (ref 96–112)
GFR calc non Af Amer: 62 mL/min — ABNORMAL LOW (ref >90)
GFR, EST AFRICAN AMERICAN: 72 mL/min — AB (ref >90)
Glucose, Bld: 181 mg/dL — ABNORMAL HIGH (ref 70–99)
Potassium: 4 mmol/L (ref 3.5–5.1)
Sodium: 132 mmol/L — ABNORMAL LOW (ref 135–145)

## 2015-01-11 LAB — URINALYSIS, ROUTINE W REFLEX MICROSCOPIC
BILIRUBIN URINE: NEGATIVE
Glucose, UA: 100 mg/dL — AB
Hgb urine dipstick: NEGATIVE
KETONES UR: 40 mg/dL — AB
Leukocytes, UA: NEGATIVE
NITRITE: NEGATIVE
Protein, ur: NEGATIVE mg/dL
Specific Gravity, Urine: 1.011 (ref 1.005–1.030)
UROBILINOGEN UA: 0.2 mg/dL (ref 0.0–1.0)
pH: 6 (ref 5.0–8.0)

## 2015-01-11 LAB — I-STAT TROPONIN, ED: TROPONIN I, POC: 0.11 ng/mL — AB (ref 0.00–0.08)

## 2015-01-11 LAB — GLUCOSE, CAPILLARY: GLUCOSE-CAPILLARY: 123 mg/dL — AB (ref 70–99)

## 2015-01-11 LAB — APTT: aPTT: 20 seconds — ABNORMAL LOW (ref 24–37)

## 2015-01-11 LAB — TSH: TSH: 1.449 u[IU]/mL (ref 0.350–4.500)

## 2015-01-11 LAB — CBC
HCT: 32.6 % — ABNORMAL LOW (ref 36.0–46.0)
Hemoglobin: 10.7 g/dL — ABNORMAL LOW (ref 12.0–15.0)
MCH: 30.1 pg (ref 26.0–34.0)
MCHC: 32.8 g/dL (ref 30.0–36.0)
MCV: 91.8 fL (ref 78.0–100.0)
PLATELETS: 110 10*3/uL — AB (ref 150–400)
RBC: 3.55 MIL/uL — ABNORMAL LOW (ref 3.87–5.11)
RDW: 13.7 % (ref 11.5–15.5)
WBC: 9.3 10*3/uL (ref 4.0–10.5)

## 2015-01-11 LAB — PROTIME-INR
INR: 0.98 (ref 0.00–1.49)
PROTHROMBIN TIME: 13.1 s (ref 11.6–15.2)

## 2015-01-11 LAB — GLUCOSE, FINGERSTICK (STAT): GLUCOSE, FINGERSTICK: 191 mg/dL — AB (ref 70–99)

## 2015-01-11 LAB — I-STAT CG4 LACTIC ACID, ED
LACTIC ACID, VENOUS: 2.72 mmol/L — AB (ref 0.5–2.0)
LACTIC ACID, VENOUS: 1.35 mmol/L (ref 0.5–2.0)

## 2015-01-11 LAB — CBG MONITORING, ED: GLUCOSE-CAPILLARY: 167 mg/dL — AB (ref 70–99)

## 2015-01-11 LAB — HEMOGLOBIN, FINGERSTICK: Hemoglobin, fingerstick: 9.7 g/dL — ABNORMAL LOW (ref 12.0–16.0)

## 2015-01-11 LAB — TROPONIN I: TROPONIN I: 0.22 ng/mL — AB (ref <0.031)

## 2015-01-11 MED ORDER — APIXABAN 5 MG PO TABS
5.0000 mg | ORAL_TABLET | Freq: Two times a day (BID) | ORAL | Status: DC
  Administered 2015-01-11 – 2015-01-12 (×2): 5 mg via ORAL
  Filled 2015-01-11 (×2): qty 1

## 2015-01-11 MED ORDER — FUROSEMIDE 20 MG PO TABS
20.0000 mg | ORAL_TABLET | Freq: Once | ORAL | Status: AC
  Administered 2015-01-11: 20 mg via ORAL
  Filled 2015-01-11: qty 1

## 2015-01-11 MED ORDER — THYROID 30 MG PO TABS
30.0000 mg | ORAL_TABLET | ORAL | Status: DC
  Filled 2015-01-11: qty 1

## 2015-01-11 MED ORDER — VITAMIN C 500 MG PO TABS
8000.0000 mg | ORAL_TABLET | Freq: Every day | ORAL | Status: DC
  Filled 2015-01-11: qty 16

## 2015-01-11 MED ORDER — ONDANSETRON HCL 4 MG/2ML IJ SOLN: 4.0000 mg | Freq: Four times a day (QID) | INTRAMUSCULAR | Status: DC | PRN

## 2015-01-11 MED ORDER — RED YEAST RICE 600 MG PO TABS: 1.0000 | ORAL_TABLET | Freq: Four times a day (QID) | ORAL | Status: DC

## 2015-01-11 MED ORDER — THYROID 30 MG PO TABS: 30.0000 mg | ORAL_TABLET | Freq: Every day | ORAL | Status: DC

## 2015-01-11 MED ORDER — ACETAMINOPHEN 325 MG PO TABS
650.0000 mg | ORAL_TABLET | ORAL | Status: DC | PRN
  Administered 2015-01-12: 650 mg via ORAL
  Filled 2015-01-11: qty 2

## 2015-01-11 MED ORDER — RISAQUAD PO CAPS
1.0000 | ORAL_CAPSULE | Freq: Every day | ORAL | Status: DC
  Filled 2015-01-11: qty 1

## 2015-01-11 MED ORDER — BETAINE HCL 300 MG PO TABS: 600.0000 mg | ORAL_TABLET | ORAL | Status: DC

## 2015-01-11 MED ORDER — HEPARIN (PORCINE) IN NACL 100-0.45 UNIT/ML-% IJ SOLN
950.0000 [IU]/h | INTRAMUSCULAR | Status: DC
  Filled 2015-01-11: qty 250

## 2015-01-11 MED ORDER — METOPROLOL TARTRATE 12.5 MG HALF TABLET
12.5000 mg | ORAL_TABLET | Freq: Two times a day (BID) | ORAL | Status: DC
  Administered 2015-01-11 – 2015-01-12 (×2): 12.5 mg via ORAL
  Filled 2015-01-11 (×3): qty 1

## 2015-01-11 MED ORDER — CO-ENZYME Q-10 50 MG PO CAPS: 200.0000 mg | ORAL_CAPSULE | Freq: Every day | ORAL | Status: DC

## 2015-01-11 MED ORDER — HEPARIN BOLUS VIA INFUSION
4000.0000 [IU] | Freq: Once | INTRAVENOUS | Status: DC
  Filled 2015-01-11: qty 4000

## 2015-01-11 MED ORDER — ASPIRIN EC 81 MG PO TBEC
81.0000 mg | DELAYED_RELEASE_TABLET | Freq: Every day | ORAL | Status: DC
  Administered 2015-01-12: 81 mg via ORAL
  Filled 2015-01-11 (×2): qty 1

## 2015-01-11 MED ORDER — VITAMIN B-12 1000 MCG PO TABS
1000.0000 ug | ORAL_TABLET | Freq: Every day | ORAL | Status: DC
  Administered 2015-01-12: 1000 ug via ORAL
  Filled 2015-01-11: qty 1

## 2015-01-11 MED ORDER — DILTIAZEM HCL 100 MG IV SOLR
5.0000 mg/h | Freq: Once | INTRAVENOUS | Status: AC
  Administered 2015-01-11: 5 mg/h via INTRAVENOUS

## 2015-01-11 MED ORDER — RESVERATROL 250 MG PO CAPS: 250.0000 mg | ORAL_CAPSULE | Freq: Every day | ORAL | Status: DC

## 2015-01-11 MED ORDER — THYROID 60 MG PO TABS
60.0000 mg | ORAL_TABLET | ORAL | Status: DC
  Administered 2015-01-12: 60 mg via ORAL
  Filled 2015-01-11: qty 1

## 2015-01-11 MED ORDER — VITAMIN D 1000 UNITS PO TABS
5000.0000 [IU] | ORAL_TABLET | Freq: Every day | ORAL | Status: DC
  Administered 2015-01-12: 5000 [IU] via ORAL
  Filled 2015-01-11 (×3): qty 5

## 2015-01-11 NOTE — ED Notes (Signed)
Pt given 10mg  bolus per Dr. Canary Brim

## 2015-01-11 NOTE — H&P (Signed)
Patient ID: Gina [name] MRN: [id number], DOB/AGE: 01/14/1946   Admit date: 01/11/2015   Primary Physician: Odette Fraction, MD Primary Cardiologist: Dr. Debara Pickett  Pt. Profile:  69 year old Caucasian female with past medical history of Type I DM, HTN, HLD, hypothyroidism and history of L breast cancer s/p lumpectomy and radiation therapy in 2013 present with recurrent presyncope and new onset afib with RVR which converted in the ED  Problem List  Past Medical History  Diagnosis Date  . Ovarian cyst   . Hypertension   . Insulin pump in place   . 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 Diabetes mellitus     Insulin pump  . Anxiety     new dx  . Hyperlipidemia   . S/P radiation therapy 04/10/12- 05/26/2012    left Breast and Axilla / 50 gy / 25 Fractions with Left Breast Boost / 10 Gy / 5 Fractions  . Use of letrozole (Femara) 06/13/12    Past Surgical History  Procedure Laterality Date  . Tonsillectomy    . Ovarian cyst surgery    . Colonoscopy      1 polyp   . 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 Appendectomy       Allergies  No Known Allergies  HPI  The patient is a pleasant 69 year old Caucasian female with past medical history of Type I DM, HTN, HLD, hypothyroidism and history of L breast cancer s/p lumpectomy and radiation therapy in 2013. She was in her usual state of health until this past Saturday 01/08/2015 when she presented to [facility] for syncope, bradycardia and hypotension. She was also noted to be hypothermic at the time, sepsis protocol was initiated. Patient was started on IV Zosyn and fluid hydration. Her blood pressure improved nicely after IV hydration. Her urinalysis at that time did show many  bacteria, however negative nitrite. Urine culture was negative. Blood culture was negative. It was eventually felt that her presentation was inconsistent with sepsis, her antibiotic was discontinued. Cardiology was consulted for elevated troponin which did peak at 0.8. However patient had no chest pain, it was felt to be demand ischemia in the setting of hypotension. Echocardiogram was obtained on 01/10/2015 which showed EF 55-60%, no regional wall motion abnormality, PA peak pressure 39. Patient was eventually discharged on 01/10/2015 and a follow-up with cardiology as outpatient with plan for outpatient Myoview. After she went home, she felt very well. She woke up on the following morning also feeling good. She went to the pharmacy to obtain her prescription medication and obtain blood pressure at the pharmacy which showed systolic blood pressure 889. She went home to read up on her discharge instruction from yesterday as when she started having recurrent dizziness and a feeling of passing out, essentially this same symptom as last Saturday. Her husband called her PCP, on arrival to her PCPs office, they were unable to obtain reading from the patient. EMS arrived, she was noted to appear very pale. She was given IV fluid with improvement in her blood pressure. However her heart rate was in the 130s.   On arrival to [facility], it was noted she was in atrial fibrillation with RVR which is new for her. She was started on IV diltiazem.  Significant laboratory finding include hemoglobin 10.7, platelets 110, lactic acid 2.72, creatinine 0.91, troponin 0.11, glucose 181. TSH was normal. Urinalysis showed negative nitrite. Chest x-ray showed worsening aspiration, bilateral interstitial prominence consistent with early CHF. Cardiology has been consulted for atrial fibrillation with RVR and interstitial edema.  Of note, patient's A. fib with RVR spontaneously converted in the ED after IV diltiazem.  Home  Medications  Prior to Admission medications   Medication Sig Start Date End Date Taking? Authorizing Provider  acidophilus (RISAQUAD) CAPS capsule Take 1 capsule by mouth daily. 01/10/15  Yes Cherene Altes, MD  Ascorbic Acid (VITAMIN C) 1000 MG tablet Take 8,000 mg by mouth daily.   Yes Historical Provider, MD  aspirin 81 MG tablet Take 81 mg by mouth daily.   Yes Historical Provider, MD  Betaine HCl 300 MG TABS Take 600 mg by mouth every 3 (three) days.    Yes Historical Provider, MD  Cholecalciferol (VITAMIN D-3) 5000 UNITS TABS Take 5,000 Units by mouth daily.    Yes Historical Provider, MD  co-enzyme Q-10 50 MG capsule Take 200 mg by mouth daily.    Yes Historical Provider, MD  [facility] Liver Oil 1000 MG CAPS Take by mouth. 4 tablets daily   Yes Historical Provider, MD  cyanocobalamin 1000 MCG tablet Take 1,000 mcg by mouth daily.    Yes Historical Provider, MD  glucose 4 GM chewable tablet Chew 16 g by mouth as needed.   Yes Historical Provider, MD  insulin lispro (HUMALOG) 100 UNIT/ML injection Basal rate 0.9 7am-12, 0.8 Noon-7am. Max of 60 units SQ every day with pump mer MD DX CODE: E10.65 Patient taking differently: Insulin pump - Basal rate from 12 am-4:59 am at 0.45 units/hr, 5 am-6:59 am at 0.9 units/hr, 7 am-9:59 am at 1 unit/hr, 10 am-11:59 am at 1.1 units/hr, 12 pm-3:59 pm at 0.9 units/hr, 4 pm-8:59 at 0.8 units/hr, 9 pm-11:59 pm at 0.75 units/hr. Max of 100 units SQ every day; max of 50 units SQ in a 2 hr time frame with pump mer MD DX CODE: E10.65 12/31/14  Yes Elayne Snare, MD  irbesartan (AVAPRO) 150 MG tablet Take 1 tablet by mouth daily. 12/27/14  Yes Historical Provider, MD  MAGNESIUM GLYCINATE PLUS PO Take 400 mg by mouth daily.   Yes Historical Provider, MD  Methylsulfonylmethane (MSM PO) Take 1,000 each by mouth daily.    Yes Historical Provider, MD  Misc Natural Products (PROGESTERONE EX) Apply 1 application topically daily. Apply 4 clicks to chest daily.   Yes Historical  Provider, MD  NALTREXONE HCL PO Take 4.5 mg by mouth.   Yes Historical Provider, MD  naproxen sodium (ANAPROX) 220 MG tablet Take 220 mg by mouth 2 (two) times daily as needed (pain).   Yes Historical Provider, MD  OVER THE COUNTER MEDICATION Take 12.5 mg by mouth daily. Iodoral   Yes Historical Provider, MD  OVER THE COUNTER MEDICATION Take 1,000 mg by mouth daily. Calcium supplement   Yes Historical Provider, MD  OVER THE COUNTER MEDICATION Take 15-30 mLs by mouth daily. Take 1-2 heaping tablespoons of diatomaceous earth by mouth daily   Yes Historical Provider, MD  OVER THE COUNTER MEDICATION Take 100 mg by mouth daily. DIM (Diindolylmethane) - hormone supplement   Yes Historical Provider, MD  OVER THE COUNTER MEDICATION Take 1,000 mg by mouth daily. AHCC (active hexose correlated compound)   Yes Historical Provider, MD  Red Yeast Rice 600 MG TABS Take 1 tablet by mouth  4 (four) times daily.   Yes Historical Provider, MD  Resveratrol 250 MG CAPS Take 250 mg by mouth daily.   Yes Historical Provider, MD  thyroid (ARMOUR) 30 MG tablet Take 30 mg by mouth daily before breakfast. Takes 1 tablet every other day and 2 tablets alternating days   Yes Historical Provider, MD    Family History  Family History  Problem Relation Age of Onset  . Breast cancer Mother 59  . Prostate cancer Maternal Uncle     died in his 80s    Social History  History   Social History  . Marital Status: Married    Spouse Name: N/A  . Number of Children: N/A  . Years of Education: N/A   Occupational History  . Not on file.   Social History Main Topics  . Smoking status: Never Smoker   . Smokeless tobacco: Not on file  . Alcohol Use: No     Comment: menarche age 21,menopause age 44,hrt short time oral,then progesterone cream ,stopped g3,p3,1st age 1  . Drug Use: No  . Sexual Activity: Yes   Other Topics Concern  . Not on file   Social History Narrative     Review of Systems General:  No chills,  fever, night sweats or weight changes.  Cardiovascular:  No chest pain, dyspnea on exertion, edema, orthopnea, palpitations, paroxysmal nocturnal dyspnea. Dermatological: No rash, lesions/masses Respiratory: No cough +mild dyspnea Urologic: No hematuria, dysuria Abdominal:   No nausea, vomiting, diarrhea, bright red blood per rectum, melena, or hematemesis Neurologic:  No visual changes. +Dizziness, presyncope. All other systems reviewed and are otherwise negative except as noted above.  Physical Exam  Blood pressure 108/53, pulse 123, temperature 98 F (36.7 C), resp. rate 19, SpO2 96 %.  General: Pleasant, NAD Psych: Normal affect. Neuro: Alert and oriented X 3. Moves all extremities spontaneously. HEENT: Normal  Neck: Supple without bruits +JVD. Lungs:  Resp regular and unlabored, CTA. Heart: RRR no s3, s4, or murmurs. Abdomen: Soft, non-tender, non-distended, BS + x 4.  Extremities: No clubbing, cyanosis. DP/PT/Radials 2+ and equal bilaterally. Trace edema in LE.   Labs  Troponin [facility] Test)  Recent Labs  01/11/15 1326  TROPIPOC 0.11*    Recent Labs  01/09/15 1020 01/09/15 1602 01/09/15 2100  TROPONINI 0.61* 0.82* 0.75*   Lab Results  Component Value Date   WBC 9.3 01/11/2015   HGB 10.7* 01/11/2015   HCT 32.6* 01/11/2015   MCV 91.8 01/11/2015   PLT 110* 01/11/2015    Recent Labs Lab 01/09/15 0305  01/11/15 1320  NA 136  --  132*  K 3.4*  --  4.0  CL 107  --  100  CO2 22  --  24  BUN 15  --  10  CREATININE 0.82  --  0.91  CALCIUM 7.6*  --  8.4  PROT 4.9*  --   --   BILITOT 0.9  --   --   ALKPHOS 55  --   --   ALT 42*  --   --   AST 51*  --   --   GLUCOSE 100*  < > 181*  < > = values in this interval not displayed. Lab Results  Component Value Date   CHOL 206* 01/09/2015   HDL 68 01/09/2015   LDLCALC 130* 01/09/2015   TRIG 40 01/09/2015   No results found for: DDIMER   Radiology/Studies  Dg Chest 2 View  01/11/2015  CLINICAL  DATA:  Hypotension and dehydration. History of breast cancer.  EXAM: CHEST  2 VIEW  COMPARISON:  01/09/2015.  FINDINGS: Worsening interstitial edema. Cardiomegaly. Congestive heart failure not excluded. No areas of focal consolidation. Small BILATERAL effusions. LEFT mastectomy. No osseous findings.  IMPRESSION: Worsening aeration. BILATERAL interstitial prominence consistent with early congestive failure.   Electronically Signed   By: Rolla Flatten M.D.   On: 01/11/2015 14:17   X-ray Chest Pa And Lateral  01/09/2015   CLINICAL DATA:  Sepsis, breast cancer  EXAM: CHEST  2 VIEW  COMPARISON:  01/08/2015  FINDINGS: Cardiomediastinal silhouette is stable. Persistent mild interstitial prominence bilaterally. Mild interstitial edema cannot be excluded. Small left pleural effusion left basilar atelectasis or or infiltrate. No segmental infiltrates. Right lung is clear.  IMPRESSION: Mild interstitial prominence bilateral suspicious for mild interstitial edema. Small left pleural effusion with left basilar atelectasis or infiltrate. Right lung is clear.   Electronically Signed   By: Lahoma Crocker M.D.   On: 01/09/2015 12:33   Dg Chest Port 1 View  01/08/2015   CLINICAL DATA:  Dizziness and right arm tingling  EXAM: PORTABLE CHEST - 1 VIEW  COMPARISON:  None.  FINDINGS: There is a degree of underlying emphysematous change. There is generalized interstitial prominence without airspace consolidation. The heart is upper normal in size with mild pulmonary venous hypertension. No adenopathy. No bone lesions. There is calcification in the right carotid artery.  IMPRESSION: Findings felt to represent a degree of congestive heart failure superimposed on emphysematous change. No airspace consolidation. Calcification right carotid artery.   Electronically Signed   By: Lowella Grip III M.D.   On: 01/08/2015 08:41    ECG  Atrial fibrillation with RVR with heart rate in the 130s.  Echocardiogram 01/10/2015  LV EF: 55% -   60%  ------------------------------------------------------------------- Indications:   Syncope 780.2.  ------------------------------------------------------------------- History:  PMH: Sepsis. Uncontrolled type 1 diabetes. Essential hypertension. Hypothyroidism, acquired autoimmune. Dehydration. Anemia. Bradycardia. Hypotension. Risk factors: Dyslipidemia.  ------------------------------------------------------------------- Study Conclusions  - Left ventricle: The cavity size was normal. Wall thickness was normal. Systolic function was normal. The estimated ejection fraction was in the range of 55% to 60%. Wall motion was normal; there were no regional wall motion abnormalities. Left ventricular diastolic function parameters were normal. - Pulmonary arteries: PA peak pressure: 39 mm Hg (S).  Impressions:  - Normal LV function; trace MR and mild TR.    ASSESSMENT AND PLAN  1. Atrial fibrillation with RVR - spontaneously converted on IV diltiazem in ED  - d/c home ARB, start metorpolol 12.5mg  BID for rate control  - CHA2DS2-Vasc score of 5 (age, female, DM, HTN, HF)  - stable renal function, likely a candidate for NOAC  - recommend discharge with 30 days event monitor to monitor for recurrence on BB. Recommended outpatient myoview by cardiology from last discharge, would continue that recommendation  2. Acute diastolic heart failure 2/2 IVF and tachycardia  - likely will resolve with single dose of 20mg  IV lasix. Would not expect recurrence unless has recurrent tachycardia  3. Type I DM 4. HTN 5. HLD 6. hypothyroidism  7. history of L breast cancer s/p lumpectomy and radiation therapy in 2013   Signed, Meng, Hao, [place] 01/11/2015, 4:25 PM    Patient seen and examined. Agree with assessment and plan. Pt is a very pleasant 69 yr old female with DM, HTN, hyperlipidemia, hypothyroidism, and history of left breast cancer status post lumpectomy and subsequent  radiation therapy.  She was recently admitted with sepsis, treated with fluids and antibiotic therapy.  An echo Doppler study revealed normal systolic function with mild pulmonary hypertension.  She was admitted today following an episode of dizziness and was found to be in atrial fibrillation with a rapid ventricular response.  She was treated with IV Cardizem and ultimately was successfully converted back to normal sinus rhythm.  She did require significant fluids previous admission and today when she was hypotensive.  I suspect there is a component of mild diastolic dysfunction presently.  With her prior history of bradycardia and hypotension, we will initiate very low-dose beta blocker therapy with metoprolol, tartrate 12.5 mg twice a day.  Her chads 2 vas to score is at least 4 by virtue of her sex, age, diabetes mellitus, and history of hypertension.  I discussed potential thromboembolic risk with PAF and based on her chads score have recommended initiation of anticoagulation therapy.  We will start her on eloquence 5 mg twice a day.  She ultimately will need an Myoview study to assess for potential ischemia.  We will keep her overnight and if she remains stable with negative enzymes and without recurrent symptomatology.  Plan for possible discharge tomorrow with outpatient event monitoring post discharge.  Since she's not having recurrent tachycardia or bradycardia arrhythmia.   Troy Sine, MD, [facility] 01/11/2015 5:44 PM

## 2015-01-11 NOTE — ED Notes (Signed)
Pt has converted to NSR. ECG shown to Dr. Canary Brim. Advised to keep cardizem at rate of 6 gtt. ECG at bedside. Pt denies any pain at this time.

## 2015-01-11 NOTE — Progress Notes (Signed)
Pt came for BP check by nurse after husband called stating patient feeling weak and having tingling in arms.  Similar to symptoms that took her to ED on 3/26.  Husband arrived at [place] and came inside.  Asked if nurse could come to car and check BP, wife to weak to come in.  Reviewed ED notes.  Went to car.  Pt Very weak, clammy feeling.  Tried x 3 to get BP  Could only hear 56 an d 54.  Husband stated pt blood sugar this morning was 182.  Had front desk call 911 and got provider.  Dr Dennard Schaumann came out to car to see patient.  We then were able to get her in wheelchair and bring inside clinic.  Placed pt on treatment table with head down.  Provider was able th get BP 90/..     EKG done.  EMS here for transport.

## 2015-01-11 NOTE — Progress Notes (Signed)
ANTICOAGULATION CONSULT NOTE - Initial Consult  Pharmacy Consult for Heparin Indication: atrial fibrillation  No Known Allergies  Patient Measurements:    Wt Readings from Last 3 Encounters:  01/10/15 153 lb 3.5 oz (69.5 kg)  09/29/14 143 lb 9.6 oz (65.137 kg)  07/06/14 146 lb 6.4 oz (66.407 kg)    Vital Signs: Temp: 98 F (36.7 C) (03/29 1241) BP: 128/96 mmHg (03/29 1315) Pulse Rate: 110 (03/29 1315)  Labs:  Recent Labs  01/09/15 0305 01/09/15 1020 01/09/15 1602 01/09/15 2100 01/11/15 1320  HGB 9.7*  --   --   --  10.7*  HCT 29.1*  --   --   --  32.6*  PLT 145*  --   --   --  110*  CREATININE 0.82  --   --   --  0.91  TROPONINI  --  0.61* 0.82* 0.75*  --     Estimated Creatinine Clearance: 55 mL/min (by C-G formula based on Cr of 0.91).   Medical History: Past Medical History  Diagnosis Date  . Ovarian cyst   . Hypertension   . Insulin pump in place   . 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 Diabetes mellitus     Insulin pump  . Anxiety     new dx  . Hyperlipidemia   . S/P radiation therapy 04/10/12- 05/26/2012    left Breast and Axilla / 50 gy / 25 Fractions with Left Breast Boost / 10 Gy / 5 Fractions  . Use of letrozole (Femara) 06/13/12    Medications:  Infusions:   Assessment: 69 year old female admitted with hypotension and weakness.  She is to begin anticoagulation with Heparin for atrial fibrillation.  She is not on anticoagulants prior to admission.  Goal of Therapy:  Heparin level 0.3-0.7 units/ml Monitor platelets by anticoagulation protocol: Yes   Plan:  Heparin 4000 units IV bolus Start Heparin infusion at 950 units/hr Check Heparin level in 8 hours Daily Heparin level and CBC  Legrand Como, Pharm.D., BCPS, AAHIVP Clinical Pharmacist Phone: [phone] or [phone] 01/11/2015, 2:30 PM

## 2015-01-11 NOTE — Progress Notes (Signed)
PHARMACIST - PHYSICIAN ORDER COMMUNICATION  CONCERNING: P&T Medication Policy on Herbal Medications  DESCRIPTION:  This patient's order for:  Resveratrol, red yeast rice, CoQ10  has been noted.  This product(s) is classified as an "herbal" or natural product. Due to a lack of definitive safety studies or FDA approval, nonstandard manufacturing practices, plus the potential risk of unknown drug-drug interactions while on inpatient medications, the Pharmacy and Therapeutics Committee does not permit the use of "herbal" or natural products of this type within [facility].   ACTION TAKEN: The pharmacy department is unable to verify this order at this time and your patient has been informed of this safety policy. Please reevaluate patient's clinical condition at discharge and address if the herbal or natural product(s) should be resumed at that time.

## 2015-01-11 NOTE — ED Provider Notes (Signed)
CSN: [id number]     Arrival date & time 01/11/15  1233 History   First MD Initiated Contact with Patient 01/11/15 1250     Chief Complaint  Patient presents with  . Hypotension  . Tachycardia     (Consider location/radiation/quality/duration/timing/severity/associated sxs/prior Treatment) HPI  Pt presenting with c/o feeling fatigue and near syncope.  She was discharged from hospital yesterday after hypotensive episode.  Pt states she was feeling very well this morning and was hoping to get a lot of things accomplished today.  She then began to feel more weak and fatigued and nearly fainted.  No chest pain.  No shortness of breath.  Pt then was seen at her PMDs office and they were not able to get a blood pressure there, so she was sent to the ED for further evaluation.  Per chart review she was discharged yesterday after negative septic workup, cardiology saw patient as well due to elevated troponin.  There are no other associated systemic symptoms, there are no other alleviating or modifying factors.   Past Medical History  Diagnosis Date  . Ovarian cyst   . Hypertension   . Insulin pump in place   . 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 Diabetes mellitus     Insulin pump  . Anxiety     new dx  . Hyperlipidemia   . S/P radiation therapy 04/10/12- 05/26/2012    left Breast and Axilla / 50 gy / 25 Fractions with Left Breast Boost / 10 Gy / 5 Fractions  . Use of letrozole (Femara) 06/13/12   Past Surgical History  Procedure Laterality Date  . Tonsillectomy    . Ovarian cyst surgery    . Colonoscopy      1 polyp   . 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 Appendectomy     Family History  Problem Relation Age of Onset  . Breast cancer Mother  88  . Prostate cancer Maternal Uncle     died in his 42s   History  Substance Use Topics  . Smoking status: Never Smoker   . Smokeless tobacco: Not on file  . Alcohol Use: No     Comment: menarche age 63,menopause age 61,hrt short time oral,then progesterone cream ,stopped g3,p3,1st age 20   OB History    Gravida Para Term Preterm AB TAB SAB Ectopic Multiple Living   3 3              Obstetric Comments   Menarche age 62, Parity age 89, Last Menses age 40, Oral contraceptives less than a month. applie Progesterone cream to chest for over 15 years.     Review of Systems  ROS reviewed and all otherwise negative except for mentioned in HPI    Allergies  Review of patient's allergies indicates no known allergies.  Home Medications   Prior to Admission medications   Medication Sig Start Date End Date Taking? Authorizing Provider  acidophilus (RISAQUAD) CAPS capsule Take 1 capsule by mouth daily. 01/10/15  Yes Lonia Blood, MD  Ascorbic Acid (VITAMIN C) 1000 MG tablet Take 8,000 mg by mouth daily.   Yes Historical Provider, MD  aspirin 81 MG tablet Take 81 mg by mouth daily.   Yes Historical Provider, MD  Betaine HCl 300 MG TABS Take 600 mg by mouth every 3 (three) days.    Yes Historical Provider, MD  Cholecalciferol (VITAMIN D-3) 5000 UNITS TABS Take 5,000 Units by mouth daily.    Yes Historical Provider, MD  co-enzyme Q-10 50 MG capsule Take 200 mg by mouth daily.    Yes Historical Provider, MD  [facility] Liver Oil 1000 MG CAPS Take by mouth. 4 tablets daily   Yes Historical Provider, MD  cyanocobalamin 1000 MCG tablet Take 1,000 mcg by mouth daily.    Yes Historical Provider, MD  glucose 4 GM chewable tablet Chew 16 g by mouth as needed.   Yes Historical Provider, MD  insulin lispro (HUMALOG) 100 UNIT/ML injection Basal rate 0.9 7am-12, 0.8 Noon-7am. Max of 60 units SQ every day with pump mer MD DX CODE: E10.65 Patient taking differently: Insulin pump - Basal rate from 12 am-4:59  am at 0.45 units/hr, 5 am-6:59 am at 0.9 units/hr, 7 am-9:59 am at 1 unit/hr, 10 am-11:59 am at 1.1 units/hr, 12 pm-3:59 pm at 0.9 units/hr, 4 pm-8:59 at 0.8 units/hr, 9 pm-11:59 pm at 0.75 units/hr. Max of 100 units SQ every day; max of 50 units SQ in a 2 hr time frame with pump mer MD DX CODE: E10.65 12/31/14  Yes Elayne Snare, MD  irbesartan (AVAPRO) 150 MG tablet Take 1 tablet by mouth daily. 12/27/14  Yes Historical Provider, MD  MAGNESIUM GLYCINATE PLUS PO Take 400 mg by mouth daily.   Yes Historical Provider, MD  Methylsulfonylmethane (MSM PO) Take 1,000 each by mouth daily.    Yes Historical Provider, MD  Misc Natural Products (PROGESTERONE EX) Apply 1 application topically daily. Apply 4 clicks to chest daily.   Yes Historical Provider, MD  NALTREXONE HCL PO Take 4.5 mg by mouth.   Yes Historical Provider, MD  naproxen sodium (ANAPROX) 220 MG tablet Take 220 mg by mouth 2 (two) times daily as needed (pain).   Yes Historical Provider, MD  OVER THE COUNTER MEDICATION Take 12.5 mg by mouth daily. Iodoral   Yes Historical Provider, MD  OVER THE COUNTER MEDICATION Take 1,000 mg by mouth daily. Calcium supplement   Yes Historical Provider, MD  OVER THE COUNTER MEDICATION Take 15-30 mLs by mouth daily. Take 1-2 heaping tablespoons of diatomaceous earth by mouth daily   Yes Historical Provider, MD  OVER THE COUNTER MEDICATION Take 100 mg by mouth daily. DIM (Diindolylmethane) - hormone supplement   Yes Historical Provider, MD  OVER THE COUNTER MEDICATION Take 1,000 mg by mouth daily. AHCC (active hexose correlated compound)   Yes Historical Provider, MD  Red Yeast Rice 600 MG TABS Take 1 tablet by mouth 4 (four) times daily.   Yes Historical Provider, MD  Resveratrol 250 MG CAPS Take 250 mg by mouth daily.   Yes Historical Provider, MD  thyroid (ARMOUR) 30 MG tablet Take 30 mg by mouth daily before breakfast. Takes 1 tablet every other day and 2 tablets alternating days   Yes Historical Provider, MD    BP 108/53 mmHg  Pulse 123  Temp(Src) 98 F (36.7 C)  Resp 19  SpO2 96%  Vitals reviewed Physical Exam  Physical Examination: General appearance - alert, well appearing, and in no distress Mental status - alert, oriented to person, place, and time Eyes - no conjunctival injection no scleral icterus Mouth - mucous membranes moist, pharynx normal without lesions Chest - clear to auscultation, no wheezes, rales or rhonchi, symmetric air entry Heart - normal  rate, regular rhythm, normal S1, S2, no murmurs, rubs, clicks or gallops Abdomen - soft, nontender, nondistended, no masses or organomegaly Neurological - alert, oriented, normal speech, no cranial nerve deficit, strength 5/5 in extremities, sensation intact Extremities - peripheral pulses normal, no pedal edema, no clubbing or cyanosis Skin - normal coloration and turgor, no rashes  ED Course  Procedures (including critical care time)  3:10 PM d/w cardiology, they will see patient in the ED, they agree with treatment thus far.    CRITICAL CARE Performed by: Threasa Beards Total critical care time: 40 Critical care time was exclusive of separately billable procedures and treating other patients. Critical care was necessary to treat or prevent imminent or life-threatening deterioration. Critical care was time spent personally by me on the following activities: development of treatment plan with patient and/or surrogate as well as nursing, discussions with consultants, evaluation of patient's response to treatment, examination of patient, obtaining history from patient or surrogate, ordering and performing treatments and interventions, ordering and review of laboratory studies, ordering and review of radiographic studies, pulse oximetry and re-evaluation of patient's condition.  Labs Review Labs Reviewed  CBC - Abnormal; Notable for the following:    RBC 3.55 (*)    Hemoglobin 10.7 (*)    HCT 32.6 (*)    Platelets 110 (*)     All other components within normal limits  BASIC METABOLIC PANEL - Abnormal; Notable for the following:    Sodium 132 (*)    Glucose, Bld 181 (*)    GFR calc non Af Amer 62 (*)    GFR calc Af Amer 72 (*)    All other components within normal limits  URINALYSIS, ROUTINE W REFLEX MICROSCOPIC - Abnormal; Notable for the following:    Glucose, UA 100 (*)    Ketones, ur 40 (*)    All other components within normal limits  APTT - Abnormal; Notable for the following:    aPTT 20 (*)    All other components within normal limits  I-STAT TROPOININ, ED - Abnormal; Notable for the following:    Troponin i, poc 0.11 (*)    All other components within normal limits  I-STAT CG4 LACTIC ACID, ED - Abnormal; Notable for the following:    Lactic Acid, Venous 2.72 (*)    All other components within normal limits  CBG MONITORING, ED - Abnormal; Notable for the following:    Glucose-Capillary 167 (*)    All other components within normal limits  TSH  PROTIME-INR  HEPARIN LEVEL (UNFRACTIONATED)  I-STAT CG4 LACTIC ACID, ED    Imaging Review Dg Chest 2 View  01/11/2015   CLINICAL DATA:  Hypotension and dehydration. History of breast cancer.  EXAM: CHEST  2 VIEW  COMPARISON:  01/09/2015.  FINDINGS: Worsening interstitial edema. Cardiomegaly. Congestive heart failure not excluded. No areas of focal consolidation. Small BILATERAL effusions. LEFT mastectomy. No osseous findings.  IMPRESSION: Worsening aeration. BILATERAL interstitial prominence consistent with early congestive failure.   Electronically Signed   By: Rolla Flatten M.D.   On: 01/11/2015 14:17     EKG Interpretation   Date/Time:  Tuesday January 11 2015 12:56:51 EDT Ventricular Rate:  139 PR Interval:    QRS Duration: 75 QT Interval:  291 QTC Calculation: 442 R Axis:   91 Text Interpretation:  Atrial fibrillation with rapid V-rate Ventricular  premature complex Right axis deviation Repolarization abnormality, prob  rate related Since  previous tracing atrial fibrillation is new Confirmed  by [facility]  MD, MARTHA [phone]) on 01/11/2015 3:31:06 PM      MDM   Final diagnoses:  Atrial fibrillation with rapid ventricular response  Near syncope  Hypotension, unspecified hypotension type  Elevated troponin    Pt presenting with c/o near syncope, hypotensive at PMD, on arrival to the ED patient is found to be in rapid afib in the 150s.  Her blood pressure improved with bolus. Pt started on diltiazem drip.  Also started on heparin- afib is new.  Also in the setting of elevated troponin.  Have consulted cardiology for admission.  they will see patient in the ED.      Alfonzo Beers, MD 01/11/15 [phone]

## 2015-01-11 NOTE — ED Notes (Signed)
Showed Dr Canary Brim strips of EKG pause - advised to turn down cardizem gtt.

## 2015-01-11 NOTE — ED Notes (Signed)
Per [facility], pt recently admitted for hypotension and dehydration, pt went to PCP today to get BP check, unable to get reading from patient, EMS arrived, pt was pale, but AAOX4, palpated BP was in the 50's. Pt given 700ccs fluid and pressure now 120s, but HR is still 130, sinus tach. Pt is AAOX4 upon arrival to ED, in NAD, denies pain. No complaints at this time. Skin warm and dry.

## 2015-01-11 NOTE — Telephone Encounter (Signed)
Pt husband called stating that pt was in ED on Sun d/t passing out and vomiting and tingling in hands ED done EKG, was normal, done ECHO was normal, was released from [place] at 5pm yesterday, this am pt is c/o tingling in hands again along with Headache, pt husband wants to bring pt in to have BP checked to make sure is ok. Pt on way.

## 2015-01-11 NOTE — ED Notes (Signed)
Spoke with Cards PA, given instruction to hold heparin IV until a decision is made to keep her here in the hospital or not.

## 2015-01-11 NOTE — Progress Notes (Signed)
Subjective:    Patient ID: Gina [name], female    DOB: June 27, 1946, 69 y.o.   MRN: [id number]  HPI Patient was driven to the office by her husband. Patient had lost consciousness in the car. Upon examination in the parking lot, the patient was diaphoretic and weak. She was confused. In a sitting position we were unable to obtain a blood pressure due to weak soft pulse but systolic BP was approaching 70.  Patient was carried from the car into our treatment room and placed in Trendelenburg position. While in Trendelenburg position patient's blood pressure was 88/54. Pulse remained regularly irregular. EKG revealed normal sinus rhythm with no ischemia or infarction. Patient's blood sugar was normal. Fingerstick hemoglobin was normal. Patient was recently admitted to the hospital with hypotension presumed from sepsis and dehydration and diarrhea. I reviewed briefly the hospital discharge summary. At the time of discharge, the exact etiology of her low blood pressure was unsure. It seemed to be due to her diarrhea. However discharge from the hospital the patient has become hypotensive again and had a syncopal episode due to hypotension while driving to my office today.  EMS was called and the patient was given 500 mL of normal saline. In Trendelenburg position and after IV fluid rehydration, the patient began to feel better. Her mental status improved. She was taken to [facility] via EMS for further evaluation Past Medical History  Diagnosis Date  . Ovarian cyst   . Hypertension   . Insulin pump in place   . 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 Diabetes mellitus     Insulin pump  . Anxiety     new dx  . Hyperlipidemia   . S/P radiation therapy 04/10/12- 05/26/2012    left Breast and Axilla / 50 gy / 25 Fractions with Left Breast Boost / 10 Gy / 5 Fractions  . Use of letrozole (Femara) 06/13/12    Past Surgical History  Procedure Laterality Date  . Tonsillectomy    . Ovarian cyst surgery    . Colonoscopy      1 polyp   . 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 Appendectomy     Current Outpatient Prescriptions on File Prior to Visit  Medication Sig Dispense Refill  . acidophilus (RISAQUAD) CAPS capsule Take 1 capsule by mouth daily. 10 capsule 0  . Ascorbic Acid (VITAMIN C) 1000 MG tablet Take 8,000 mg by mouth daily.    Marland Kitchen aspirin 81 MG tablet Take 81 mg by mouth daily.    . Betaine HCl 300 MG TABS Take 600 mg by mouth every 3 (three) days.     . Cholecalciferol (VITAMIN D-3) 5000 UNITS TABS Take 5,000 Units by mouth daily.     Marland Kitchen co-enzyme Q-10 50 MG capsule Take 200 mg by mouth daily.     Marland Kitchen Cod Liver Oil 1000 MG CAPS Take by mouth. 4 tablets daily    . cyanocobalamin 1000 MCG tablet Take 1,000 mcg by mouth daily.     Marland Kitchen glucose 4 GM chewable tablet Chew 16 g by mouth as needed.    . insulin lispro (HUMALOG) 100 UNIT/ML injection Basal rate 0.9 7am-12, 0.8 Noon-7am. Max of 60 units SQ every day with pump mer MD DX  CODE: E10.65 (Patient taking differently: Insulin pump - Basal rate from 12 am-4:59 am at 0.45 units/hr, 5 am-6:59 am at 0.9 units/hr, 7 am-9:59 am at 1 unit/hr, 10 am-11:59 am at 1.1 units/hr, 12 pm-3:59 pm at 0.9 units/hr, 4 pm-8:59 at 0.8 units/hr, 9 pm-11:59 pm at 0.75 units/hr. Max of 100 units SQ every day; max of 50 units SQ in a 2 hr time frame with pump mer MD DX CODE: E10.65) 20 mL 6  . MAGNESIUM GLYCINATE PLUS PO Take 400 mg by mouth daily.    . Methylsulfonylmethane (MSM PO) Take 1,000 each by mouth daily.     . Misc Natural Products (PROGESTERONE EX) Apply 1 application topically daily. Apply 4 clicks to chest daily.    Marland Kitchen NALTREXONE HCL PO Take 4.5 mg by mouth.    . naproxen sodium (ANAPROX) 220 MG  tablet Take 220 mg by mouth 2 (two) times daily as needed (pain).    Marland Kitchen OVER THE COUNTER MEDICATION Take 12.5 mg by mouth daily. Iodoral    . OVER THE COUNTER MEDICATION Take 1,000 mg by mouth daily. Calcium supplement    . OVER THE COUNTER MEDICATION Take 15-30 mLs by mouth daily. Take 1-2 heaping tablespoons of diatomaceous earth by mouth daily    . OVER THE COUNTER MEDICATION Take 100 mg by mouth daily. DIM (Diindolylmethane) - hormone supplement    . OVER THE COUNTER MEDICATION Take 1,000 mg by mouth daily. AHCC (active hexose correlated compound)    . Red Yeast Rice 600 MG TABS Take 1 tablet by mouth 4 (four) times daily.    Marland Kitchen Resveratrol 250 MG CAPS Take 250 mg by mouth daily.    Marland Kitchen thyroid (ARMOUR) 30 MG tablet Take 30 mg by mouth daily before breakfast. Takes 1 tablet every other day and 2 tablets alternating days     Current Facility-Administered Medications on File Prior to Visit  Medication Dose Route Frequency Provider Last Rate Last Dose  . topical emolient (BIAFINE) emulsion   Topical Daily Eppie Gibson, MD       No Known Allergies History   Social History  . Marital Status: Married    Spouse Name: N/A  . Number of Children: N/A  . Years of Education: N/A   Occupational History  . Not on file.   Social History Main Topics  . Smoking status: Never Smoker   . Smokeless tobacco: Not on file  . Alcohol Use: No     Comment: menarche age 33,menopause age 49,hrt short time oral,then progesterone cream ,stopped g3,p3,1st age 71  . Drug Use: No  . Sexual Activity: Yes   Other Topics Concern  . Not on file   Social History Narrative      Review of Systems  All other systems reviewed and are negative.      Objective:   Physical Exam  Constitutional: She appears lethargic. She has a sickly appearance.  Neck: Neck supple. No JVD present.  Cardiovascular: Normal rate.  An irregularly irregular rhythm present.  Pulmonary/Chest: Effort normal and breath sounds normal.  No respiratory distress. She has no wheezes. She has no rales. She exhibits no tenderness.  Abdominal: Soft. Bowel sounds are normal. She exhibits no distension and no mass. There is no tenderness. There is no rebound and no guarding.  Musculoskeletal: She exhibits no edema.  Neurological: She appears lethargic.  Skin: She is diaphoretic.          Assessment & Plan:  Syncope and collapse -  Plan: EKG 12-Lead, Hemoglobin, fingerstick, Glucose, fingerstick (stat)  Patient's syncope was due to hypotension. On examination there is no evidence of cardiogenic shock. Her EKG is normal. Patient appears to be dehydrated and hypovolemic. She responded rapidly to IV fluids and Trendelenburg positioning. She was transported to [facility] via EMS for further evaluation. Apparently the patient has already been worked up for sepsis from infection with no specific cause found. At the present time this does not appear to be due to infection. There is no evidence of a myocardial infarction. While the patient may still be dehydrated, I would also be concerned about autonomic insufficiency such as  Shy-Drager syndrome if no other cause for the patient's hypotension can be found. I would also consider adrenal insufficiency.

## 2015-01-11 NOTE — ED Notes (Signed)
Lab results for CG4 reported to [place].

## 2015-01-12 ENCOUNTER — Other Ambulatory Visit (HOSPITAL_COMMUNITY): Payer: Self-pay

## 2015-01-12 ENCOUNTER — Encounter (HOSPITAL_COMMUNITY): Payer: Self-pay | Admitting: Family Medicine

## 2015-01-12 ENCOUNTER — Emergency Department (HOSPITAL_COMMUNITY): Payer: Medicare Other

## 2015-01-12 ENCOUNTER — Inpatient Hospital Stay (HOSPITAL_COMMUNITY)
Admission: EM | Admit: 2015-01-12 | Discharge: 2015-01-14 | DRG: 872 | Disposition: A | Discharge status: Home / Self Care | Payer: Medicare Other | Attending: Internal Medicine | Admitting: Internal Medicine

## 2015-01-12 ENCOUNTER — Telehealth: Payer: Self-pay | Admitting: Internal Medicine

## 2015-01-12 ENCOUNTER — Encounter (HOSPITAL_COMMUNITY): Payer: Self-pay | Admitting: *Deleted

## 2015-01-12 DIAGNOSIS — I480 Paroxysmal atrial fibrillation: Secondary | ICD-10-CM | POA: Diagnosis not present

## 2015-01-12 DIAGNOSIS — E039 Hypothyroidism, unspecified: Secondary | ICD-10-CM | POA: Diagnosis present

## 2015-01-12 DIAGNOSIS — E109 Type 1 diabetes mellitus without complications: Secondary | ICD-10-CM

## 2015-01-12 DIAGNOSIS — I5032 Chronic diastolic (congestive) heart failure: Secondary | ICD-10-CM | POA: Diagnosis present

## 2015-01-12 DIAGNOSIS — Z7982 Long term (current) use of aspirin: Secondary | ICD-10-CM

## 2015-01-12 DIAGNOSIS — E119 Type 2 diabetes mellitus without complications: Secondary | ICD-10-CM | POA: Diagnosis present

## 2015-01-12 DIAGNOSIS — Z923 Personal history of irradiation: Secondary | ICD-10-CM | POA: Diagnosis not present

## 2015-01-12 DIAGNOSIS — R079 Chest pain, unspecified: Secondary | ICD-10-CM | POA: Insufficient documentation

## 2015-01-12 DIAGNOSIS — Z853 Personal history of malignant neoplasm of breast: Secondary | ICD-10-CM | POA: Diagnosis not present

## 2015-01-12 DIAGNOSIS — Z794 Long term (current) use of insulin: Secondary | ICD-10-CM | POA: Diagnosis not present

## 2015-01-12 DIAGNOSIS — A419 Sepsis, unspecified organism: Principal | ICD-10-CM | POA: Diagnosis present

## 2015-01-12 DIAGNOSIS — Z9641 Presence of insulin pump (external) (internal): Secondary | ICD-10-CM | POA: Diagnosis present

## 2015-01-12 DIAGNOSIS — F419 Anxiety disorder, unspecified: Secondary | ICD-10-CM | POA: Diagnosis present

## 2015-01-12 DIAGNOSIS — I483 Typical atrial flutter: Secondary | ICD-10-CM

## 2015-01-12 DIAGNOSIS — R7989 Other specified abnormal findings of blood chemistry: Secondary | ICD-10-CM

## 2015-01-12 DIAGNOSIS — Z7901 Long term (current) use of anticoagulants: Secondary | ICD-10-CM

## 2015-01-12 DIAGNOSIS — I4892 Unspecified atrial flutter: Secondary | ICD-10-CM | POA: Diagnosis present

## 2015-01-12 DIAGNOSIS — M7989 Other specified soft tissue disorders: Secondary | ICD-10-CM | POA: Diagnosis not present

## 2015-01-12 DIAGNOSIS — E785 Hyperlipidemia, unspecified: Secondary | ICD-10-CM | POA: Diagnosis present

## 2015-01-12 DIAGNOSIS — R509 Fever, unspecified: Secondary | ICD-10-CM | POA: Diagnosis present

## 2015-01-12 DIAGNOSIS — I10 Essential (primary) hypertension: Secondary | ICD-10-CM | POA: Diagnosis present

## 2015-01-12 DIAGNOSIS — R791 Abnormal coagulation profile: Secondary | ICD-10-CM | POA: Diagnosis not present

## 2015-01-12 DIAGNOSIS — Z888 Allergy status to other drugs, medicaments and biological substances status: Secondary | ICD-10-CM | POA: Diagnosis not present

## 2015-01-12 DIAGNOSIS — I471 Supraventricular tachycardia: Secondary | ICD-10-CM

## 2015-01-12 DIAGNOSIS — I4891 Unspecified atrial fibrillation: Secondary | ICD-10-CM | POA: Diagnosis present

## 2015-01-12 HISTORY — DX: Unspecified atrial fibrillation: I48.91

## 2015-01-12 HISTORY — DX: Unspecified diastolic (congestive) heart failure: I50.30

## 2015-01-12 HISTORY — DX: Hypothyroidism, unspecified: E03.9

## 2015-01-12 LAB — TROPONIN I
TROPONIN I: 0.14 ng/mL — AB (ref <0.031)
Troponin I: 0.19 ng/mL — ABNORMAL HIGH (ref <0.031)

## 2015-01-12 LAB — BASIC METABOLIC PANEL
ANION GAP: 7 (ref 5–15)
ANION GAP: 16 — AB (ref 5–15)
BUN: 7 mg/dL (ref 6–23)
BUN: 5 mg/dL — ABNORMAL LOW (ref 6–23)
CHLORIDE: 103 mmol/L (ref 96–112)
CO2: 26 mmol/L (ref 19–32)
CO2: 21 mmol/L (ref 19–32)
CREATININE: 0.73 mg/dL (ref 0.50–1.10)
Calcium: 8.3 mg/dL — ABNORMAL LOW (ref 8.4–10.5)
Calcium: 8.9 mg/dL (ref 8.4–10.5)
Chloride: 97 mmol/L (ref 96–112)
Creatinine, Ser: 0.66 mg/dL (ref 0.50–1.10)
GFR calc Af Amer: >90 mL/min (ref >90)
GFR calc Af Amer: >90 mL/min (ref >90)
GFR, EST NON AFRICAN AMERICAN: 87 mL/min — AB (ref >90)
GFR, EST NON AFRICAN AMERICAN: 85 mL/min — AB (ref >90)
Glucose, Bld: 117 mg/dL — ABNORMAL HIGH (ref 70–99)
Glucose, Bld: 84 mg/dL (ref 70–99)
Potassium: 3.5 mmol/L (ref 3.5–5.1)
Potassium: 3.5 mmol/L (ref 3.5–5.1)
SODIUM: 136 mmol/L (ref 135–145)
Sodium: 134 mmol/L — ABNORMAL LOW (ref 135–145)

## 2015-01-12 LAB — CBC
HCT: 34.2 % — ABNORMAL LOW (ref 36.0–46.0)
HEMATOCRIT: 28.4 % — AB (ref 36.0–46.0)
HEMOGLOBIN: 9.5 g/dL — AB (ref 12.0–15.0)
HEMOGLOBIN: 11.5 g/dL — AB (ref 12.0–15.0)
MCH: 30 pg (ref 26.0–34.0)
MCH: 30.1 pg (ref 26.0–34.0)
MCHC: 33.5 g/dL (ref 30.0–36.0)
MCHC: 33.6 g/dL (ref 30.0–36.0)
MCV: 89.6 fL (ref 78.0–100.0)
MCV: 89.5 fL (ref 78.0–100.0)
PLATELETS: 113 10*3/uL — AB (ref 150–400)
PLATELETS: 140 10*3/uL — AB (ref 150–400)
RBC: 3.17 MIL/uL — ABNORMAL LOW (ref 3.87–5.11)
RBC: 3.82 MIL/uL — ABNORMAL LOW (ref 3.87–5.11)
RDW: 13.6 % (ref 11.5–15.5)
RDW: 13.5 % (ref 11.5–15.5)
WBC: 4 10*3/uL (ref 4.0–10.5)
WBC: 8.5 10*3/uL (ref 4.0–10.5)

## 2015-01-12 LAB — LIPID PANEL
CHOL/HDL RATIO: 3.1 ratio
CHOLESTEROL: 165 mg/dL (ref 0–200)
HDL: 54 mg/dL (ref >39)
LDL CALC: 100 mg/dL — AB (ref 0–99)
Triglycerides: 56 mg/dL (ref <150)
VLDL: 11 mg/dL (ref 0–40)

## 2015-01-12 LAB — I-STAT CHEM 8, ED
BUN: 7 mg/dL (ref 6–23)
CALCIUM ION: 1.09 mmol/L — AB (ref 1.13–1.30)
Chloride: 96 mmol/L (ref 96–112)
Creatinine, Ser: 0.5 mg/dL (ref 0.50–1.10)
GLUCOSE: 80 mg/dL (ref 70–99)
HCT: 39 % (ref 36.0–46.0)
Hemoglobin: 13.3 g/dL (ref 12.0–15.0)
POTASSIUM: 3.5 mmol/L (ref 3.5–5.1)
SODIUM: 136 mmol/L (ref 135–145)
TCO2: 23 mmol/L (ref 0–100)

## 2015-01-12 LAB — URINALYSIS, ROUTINE W REFLEX MICROSCOPIC
BILIRUBIN URINE: NEGATIVE
Glucose, UA: NEGATIVE mg/dL
HGB URINE DIPSTICK: NEGATIVE
KETONES UR: 40 mg/dL — AB
Leukocytes, UA: NEGATIVE
Nitrite: NEGATIVE
PH: 6.5 (ref 5.0–8.0)
PROTEIN: NEGATIVE mg/dL
SPECIFIC GRAVITY, URINE: 1.009 (ref 1.005–1.030)
UROBILINOGEN UA: 0.2 mg/dL (ref 0.0–1.0)

## 2015-01-12 LAB — I-STAT TROPONIN, ED: Troponin i, poc: 0.04 ng/mL (ref 0.00–0.08)

## 2015-01-12 LAB — GLUCOSE, CAPILLARY
GLUCOSE-CAPILLARY: 122 mg/dL — AB (ref 70–99)
GLUCOSE-CAPILLARY: 122 mg/dL — AB (ref 70–99)
Glucose-Capillary: 99 mg/dL (ref 70–99)
Glucose-Capillary: 121 mg/dL — ABNORMAL HIGH (ref 70–99)

## 2015-01-12 LAB — BRAIN NATRIURETIC PEPTIDE: B NATRIURETIC PEPTIDE 5: 345.8 pg/mL — AB (ref 0.0–100.0)

## 2015-01-12 LAB — D-DIMER, QUANTITATIVE: D-Dimer, Quant: 1.66 ug/mL-FEU — ABNORMAL HIGH (ref 0.00–0.48)

## 2015-01-12 MED ORDER — APIXABAN 5 MG PO TABS: 5.0000 mg | ORAL_TABLET | Freq: Two times a day (BID) | ORAL | Status: DC

## 2015-01-12 MED ORDER — APIXABAN 5 MG PO TABS
5.0000 mg | ORAL_TABLET | Freq: Two times a day (BID) | ORAL | Status: DC
  Administered 2015-01-12 – 2015-01-14 (×4): 5 mg via ORAL
  Filled 2015-01-12 (×5): qty 1

## 2015-01-12 MED ORDER — DILTIAZEM HCL 60 MG PO TABS
60.0000 mg | ORAL_TABLET | Freq: Four times a day (QID) | ORAL | Status: DC
  Administered 2015-01-12 – 2015-01-13 (×4): 60 mg via ORAL
  Filled 2015-01-12 (×8): qty 1

## 2015-01-12 MED ORDER — CEFTRIAXONE SODIUM IN DEXTROSE 20 MG/ML IV SOLN
1.0000 g | INTRAVENOUS | Status: DC
  Administered 2015-01-12 – 2015-01-13 (×2): 1 g via INTRAVENOUS
  Filled 2015-01-12 (×3): qty 50

## 2015-01-12 MED ORDER — THYROID 30 MG PO TABS: 30.0000 mg | ORAL_TABLET | Freq: Every day | ORAL | Status: DC

## 2015-01-12 MED ORDER — ONDANSETRON HCL 4 MG/2ML IJ SOLN: 4.0000 mg | Freq: Four times a day (QID) | INTRAMUSCULAR | Status: DC | PRN

## 2015-01-12 MED ORDER — VANCOMYCIN HCL IN DEXTROSE 1-5 GM/200ML-% IV SOLN: 1000.0000 mg | Freq: Two times a day (BID) | INTRAVENOUS | Status: DC

## 2015-01-12 MED ORDER — DILTIAZEM HCL 25 MG/5ML IV SOLN
20.0000 mg | Freq: Once | INTRAVENOUS | Status: AC
  Administered 2015-01-12: 20 mg via INTRAVENOUS
  Filled 2015-01-12: qty 5

## 2015-01-12 MED ORDER — DOCUSATE SODIUM 100 MG PO CAPS
100.0000 mg | ORAL_CAPSULE | Freq: Two times a day (BID) | ORAL | Status: DC
  Administered 2015-01-13 – 2015-01-14 (×2): 100 mg via ORAL
  Filled 2015-01-12 (×4): qty 1

## 2015-01-12 MED ORDER — ACETAMINOPHEN 325 MG PO TABS
650.0000 mg | ORAL_TABLET | Freq: Four times a day (QID) | ORAL | Status: DC | PRN
  Administered 2015-01-12 – 2015-01-13 (×2): 650 mg via ORAL
  Filled 2015-01-12 (×2): qty 2

## 2015-01-12 MED ORDER — METOPROLOL TARTRATE 12.5 MG HALF TABLET
12.5000 mg | ORAL_TABLET | Freq: Two times a day (BID) | ORAL | Status: DC
  Administered 2015-01-12 – 2015-01-14 (×4): 12.5 mg via ORAL
  Filled 2015-01-12 (×5): qty 1

## 2015-01-12 MED ORDER — INSULIN PUMP
Freq: Three times a day (TID) | SUBCUTANEOUS | Status: DC
  Administered 2015-01-11: 1.8 via SUBCUTANEOUS
  Administered 2015-01-12: 0.65 via SUBCUTANEOUS
  Filled 2015-01-12: qty 1

## 2015-01-12 MED ORDER — SODIUM CHLORIDE 0.9 % IV SOLN
INTRAVENOUS | Status: DC
  Administered 2015-01-12 – 2015-01-13 (×2): via INTRAVENOUS

## 2015-01-12 MED ORDER — ACETAMINOPHEN 650 MG RE SUPP: 650.0000 mg | Freq: Four times a day (QID) | RECTAL | Status: DC | PRN

## 2015-01-12 MED ORDER — OFF THE BEAT BOOK: Freq: Once | Status: DC

## 2015-01-12 MED ORDER — ONDANSETRON HCL 4 MG PO TABS: 4.0000 mg | ORAL_TABLET | Freq: Four times a day (QID) | ORAL | Status: DC | PRN

## 2015-01-12 MED ORDER — FLECAINIDE ACETATE 100 MG PO TABS
100.0000 mg | ORAL_TABLET | Freq: Once | ORAL | Status: AC
  Administered 2015-01-12: 100 mg via ORAL
  Filled 2015-01-12: qty 1

## 2015-01-12 MED ORDER — METOPROLOL TARTRATE 25 MG PO TABS: 12.5000 mg | ORAL_TABLET | Freq: Two times a day (BID) | ORAL | Status: DC

## 2015-01-12 MED ORDER — ASPIRIN EC 81 MG PO TBEC
81.0000 mg | DELAYED_RELEASE_TABLET | Freq: Every day | ORAL | Status: DC
  Administered 2015-01-12 – 2015-01-13 (×2): 81 mg via ORAL
  Filled 2015-01-12 (×2): qty 1

## 2015-01-12 MED ORDER — POTASSIUM CHLORIDE CRYS ER 20 MEQ PO TBCR
20.0000 meq | EXTENDED_RELEASE_TABLET | Freq: Once | ORAL | Status: AC
  Administered 2015-01-12: 20 meq via ORAL
  Filled 2015-01-12: qty 1

## 2015-01-12 MED ORDER — VANCOMYCIN HCL IN DEXTROSE 750-5 MG/150ML-% IV SOLN
750.0000 mg | Freq: Two times a day (BID) | INTRAVENOUS | Status: DC
  Administered 2015-01-13 – 2015-01-14 (×2): 750 mg via INTRAVENOUS
  Filled 2015-01-12 (×3): qty 150

## 2015-01-12 MED ORDER — SODIUM CHLORIDE 0.9 % IJ SOLN
3.0000 mL | Freq: Two times a day (BID) | INTRAMUSCULAR | Status: DC
  Administered 2015-01-12: 3 mL via INTRAVENOUS

## 2015-01-12 MED ORDER — INSULIN PUMP
Freq: Three times a day (TID) | SUBCUTANEOUS | Status: DC
Start: 2015-01-12 — End: 2015-01-14
  Administered 2015-01-13 (×2): via SUBCUTANEOUS
  Administered 2015-01-13: 0.9 via SUBCUTANEOUS
  Administered 2015-01-13 – 2015-01-14 (×3): via SUBCUTANEOUS
  Filled 2015-01-12: qty 1

## 2015-01-12 MED ORDER — THYROID 60 MG PO TABS
60.0000 mg | ORAL_TABLET | ORAL | Status: DC
  Administered 2015-01-14: 60 mg via ORAL
  Filled 2015-01-12: qty 1

## 2015-01-12 MED ORDER — MAGNESIUM HYDROXIDE 400 MG/5ML PO SUSP: 30.0000 mL | Freq: Every day | ORAL | Status: DC | PRN

## 2015-01-12 MED ORDER — ACETAMINOPHEN 500 MG PO TABS
1000.0000 mg | ORAL_TABLET | Freq: Once | ORAL | Status: AC
  Administered 2015-01-12: 1000 mg via ORAL
  Filled 2015-01-12: qty 2

## 2015-01-12 MED ORDER — THYROID 30 MG PO TABS
30.0000 mg | ORAL_TABLET | ORAL | Status: DC
  Administered 2015-01-13: 30 mg via ORAL
  Filled 2015-01-12 (×2): qty 1

## 2015-01-12 MED ORDER — ACETAMINOPHEN 325 MG PO TABS: 650.0000 mg | ORAL_TABLET | ORAL | Status: DC | PRN

## 2015-01-12 NOTE — Progress Notes (Signed)
Received pt report from [place].

## 2015-01-12 NOTE — ED Notes (Signed)
Attempted report x1. 

## 2015-01-12 NOTE — ED Provider Notes (Signed)
CSN: [id number]     Arrival date & time 01/12/15  1556 History   First MD Initiated Contact with Patient 01/12/15 1619     Chief Complaint  Patient presents with  . Tachycardia     (Consider location/radiation/quality/duration/timing/severity/associated sxs/prior Treatment) HPI Patient complains of tachycardia, onset of prostate 12 noon today. She returned home from the hospital from inpatient stay earlier today for atrial fibrillation with rapid ventricular response. She went home and was new or heart with a stethoscope heartbeat noted to be greater than 100. She denies any palpitations She called her primary care physician and advise her to return to the hospital. She is asymptomatic. Denies chest pain denies shortness of breath denies lightheadedness. No other associated symptoms. No treatment prior to coming here Past Medical History  Diagnosis Date  . Ovarian cyst   . Hypertension   . Insulin pump in place   . 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 Anxiety     new dx  . Hyperlipidemia   . S/P radiation therapy 04/10/12- 05/26/2012    left Breast and Axilla / 50 gy / 25 Fractions with Left Breast Boost / 10 Gy / 5 Fractions  . Use of letrozole (Femara) 06/13/12  . Diabetes mellitus type 1     Insulin pump   Past Surgical History  Procedure Laterality Date  . Tonsillectomy    . Ovarian cyst surgery    . Colonoscopy      1 polyp   . 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 Appendectomy     Family History  Problem Relation Age of Onset  . Breast cancer Mother 28  . Prostate cancer Maternal Uncle     died in his 64s   History  Substance Use Topics  . Smoking status: Never Smoker   . Smokeless tobacco: Not on file  . Alcohol Use: No      Comment: menarche age 38,menopause age 47,hrt short time oral,then progesterone cream ,stopped g3,p3,1st age 39   OB History    Gravida Para Term Preterm AB TAB SAB Ectopic Multiple Living   3 3              Obstetric Comments   Menarche age 72, Parity age 20, Last Menses age 20, Oral contraceptives less than a month. applie Progesterone cream to chest for over 15 years.     Review of Systems  Constitutional: Negative.   HENT: Negative.   Respiratory: Negative.   Cardiovascular: Negative.   Gastrointestinal: Negative.   Musculoskeletal: Negative.   Skin: Negative.   Allergic/Immunologic: Positive for immunocompromised state.       Diabetic  Neurological: Negative.   Psychiatric/Behavioral: Negative.   All other systems reviewed and are negative.     Allergies  Statins  Home Medications   Prior to Admission medications   Medication Sig Start Date End Date Taking? Authorizing Provider  acetaminophen (TYLENOL) 325 MG tablet Take 2 tablets (650 mg total) by mouth every 4 (four) hours as needed for headache or mild pain. 01/12/15   Erlene Quan, PA-C  acidophilus (RISAQUAD) CAPS capsule Take 1 capsule by mouth daily. 01/10/15   Cherene Altes, MD  apixaban (ELIQUIS) 5 MG TABS tablet Take 1 tablet (5 mg total) by  mouth 2 (two) times daily. 01/12/15   Erlene Quan, PA-C  Ascorbic Acid (VITAMIN C) 1000 MG tablet Take 8,000 mg by mouth daily.    Historical Provider, MD  aspirin 81 MG tablet Take 81 mg by mouth daily.    Historical Provider, MD  Cholecalciferol (VITAMIN D-3) 5000 UNITS TABS Take 5,000 Units by mouth daily.     Historical Provider, MD  co-enzyme Q-10 50 MG capsule Take 200 mg by mouth daily.     Historical Provider, MD  [facility] Liver Oil 1000 MG CAPS Take by mouth. 4 tablets daily    Historical Provider, MD  cyanocobalamin 1000 MCG tablet Take 1,000 mcg by mouth daily.     Historical Provider, MD  glucose 4 GM chewable tablet Chew 16 g by mouth as needed.     Historical Provider, MD  insulin lispro (HUMALOG) 100 UNIT/ML injection Basal rate 0.9 7am-12, 0.8 Noon-7am. Max of 60 units SQ every day with pump mer MD DX CODE: E10.65 Patient taking differently: Insulin pump - Basal rate from 12 am-4:59 am at 0.45 units/hr, 5 am-6:59 am at 0.9 units/hr, 7 am-9:59 am at 1 unit/hr, 10 am-11:59 am at 1.1 units/hr, 12 pm-3:59 pm at 0.9 units/hr, 4 pm-8:59 at 0.8 units/hr, 9 pm-11:59 pm at 0.75 units/hr. Max of 100 units SQ every day; max of 50 units SQ in a 2 hr time frame with pump mer MD DX CODE: E10.65 12/31/14   Elayne Snare, MD  MAGNESIUM GLYCINATE PLUS PO Take 400 mg by mouth daily.    Historical Provider, MD  Methylsulfonylmethane (MSM PO) Take 1,000 each by mouth daily.     Historical Provider, MD  metoprolol tartrate (LOPRESSOR) 25 MG tablet Take 0.5 tablets (12.5 mg total) by mouth 2 (two) times daily. 01/12/15   Erlene Quan, PA-C  Misc Natural Products (PROGESTERONE EX) Apply 1 application topically daily. Apply 4 clicks to chest daily.    Historical Provider, MD  NALTREXONE HCL PO Take 4.5 mg by mouth.    Historical Provider, MD  naproxen sodium (ANAPROX) 220 MG tablet Take 220 mg by mouth 2 (two) times daily as needed (pain).    Historical Provider, MD  Red Yeast Rice 600 MG TABS Take 1 tablet by mouth 4 (four) times daily.    Historical Provider, MD  thyroid (ARMOUR) 30 MG tablet Take 30 mg by mouth daily before breakfast. Takes 1 tablet every other day and 2 tablets alternating days    Historical Provider, MD   There were no vitals taken for this visit. Physical Exam  Constitutional: She appears well-developed and well-nourished.  HENT:  Head: Normocephalic and atraumatic.  Eyes: Conjunctivae are normal. Pupils are equal, round, and reactive to light.  Neck: Neck supple. No tracheal deviation present. No thyromegaly present.  Cardiovascular: Regular rhythm.   No murmur heard. Tachycardia  Pulmonary/Chest: Effort normal and breath sounds normal.   Abdominal: Soft. Bowel sounds are normal. She exhibits no distension. There is no tenderness.  Musculoskeletal: Normal range of motion. She exhibits no edema or tenderness.  Neurological: She is alert. Coordination normal.  Skin: Skin is warm and dry. No rash noted.  Psychiatric: She has a normal mood and affect.  Nursing note and vitals reviewed.   ED Course  Procedures (including critical care time) Labs Review Labs Reviewed  CBC  BASIC METABOLIC PANEL  BRAIN NATRIURETIC PEPTIDE  Randolm Idol, ED    Imaging Review Dg Chest 2 View  01/11/2015   CLINICAL DATA:  Hypotension and dehydration. History of breast cancer.  EXAM: CHEST  2 VIEW  COMPARISON:  01/09/2015.  FINDINGS: Worsening interstitial edema. Cardiomegaly. Congestive heart failure not excluded. No areas of focal consolidation. Small BILATERAL effusions. LEFT mastectomy. No osseous findings.  IMPRESSION: Worsening aeration. BILATERAL interstitial prominence consistent with early congestive failure.   Electronically Signed   By: Rolla Flatten M.D.   On: 01/11/2015 14:17     EKG Interpretation   Date/Time:  Wednesday January 12 2015 16:31:40 EDT Ventricular Rate:  150 PR Interval:  166 QRS Duration: 92 QT Interval:  290 QTC Calculation: 458 R Axis:   96 Text Interpretation:  Sinus tachycardia Rightward axis Septal infarct ,  age undetermined Marked ST abnormality, possible inferior subendocardial  injury Abnormal ECG SINCE LAST TRACING HEART RATE HAS INCREASED Confirmed  by Winfred Leeds  MD, Jere Bostrom [phone]) on 01/12/2015 5:00:28 PM     Correction EKG suggestive of atrial flutter with 2:1 block  5:05 PM patient remains asymptomatic Results for orders placed or performed during the hospital encounter of 01/12/15  CBC  Result Value Ref Range   WBC 8.5 4.0 - 10.5 K/uL   RBC 3.82 (L) 3.87 - 5.11 MIL/uL   Hemoglobin 11.5 (L) 12.0 - 15.0 g/dL   HCT 34.2 (L) 36.0 - 46.0 %   MCV 89.5 78.0 - 100.0 fL   MCH 30.1 26.0 - 34.0  pg   MCHC 33.6 30.0 - 36.0 g/dL   RDW 13.5 11.5 - 15.5 %   Platelets 140 (L) 150 - 400 K/uL  Basic metabolic panel  Result Value Ref Range   Sodium 134 (L) 135 - 145 mmol/L   Potassium 3.5 3.5 - 5.1 mmol/L   Chloride 97 96 - 112 mmol/L   CO2 21 19 - 32 mmol/L   Glucose, Bld 84 70 - 99 mg/dL   BUN 5 (L) 6 - 23 mg/dL   Creatinine, Ser 0.73 0.50 - 1.10 mg/dL   Calcium 8.9 8.4 - 10.5 mg/dL   GFR calc non Af Amer 85 (L) >90 mL/min   GFR calc Af Amer >90 >90 mL/min   Anion gap 16 (H) 5 - 15  I-stat troponin, ED (not at [facility])  Result Value Ref Range   Troponin i, poc <0.20 (HH) 0.00 - 0.08 ng/mL   Comment 3          I-stat chem 8, ed  Result Value Ref Range   Sodium 136 135 - 145 mmol/L   Potassium 3.5 3.5 - 5.1 mmol/L   Chloride 96 96 - 112 mmol/L   BUN 7 6 - 23 mg/dL   Creatinine, Ser 0.50 0.50 - 1.10 mg/dL   Glucose, Bld 80 70 - 99 mg/dL   Calcium, Ion 1.09 (L) 1.13 - 1.30 mmol/L   TCO2 23 0 - 100 mmol/L   Hemoglobin 13.3 12.0 - 15.0 g/dL   HCT 39.0 36.0 - 46.0 %   Dg Chest 2 View  01/11/2015   CLINICAL DATA:  Hypotension and dehydration. History of breast cancer.  EXAM: CHEST  2 VIEW  COMPARISON:  01/09/2015.  FINDINGS: Worsening interstitial edema. Cardiomegaly. Congestive heart failure not excluded. No areas of focal consolidation. Small BILATERAL effusions. LEFT mastectomy. No osseous findings.  IMPRESSION: Worsening aeration. BILATERAL interstitial prominence consistent with early congestive failure.   Electronically Signed   By: Rolla Flatten M.D.   On: 01/11/2015 14:17   X-ray Chest Pa And Lateral  01/09/2015   CLINICAL DATA:  Sepsis, breast cancer  EXAM: CHEST  2 VIEW  COMPARISON:  01/08/2015  FINDINGS: Cardiomediastinal silhouette is stable. Persistent mild interstitial prominence bilaterally. Mild interstitial edema cannot be excluded. Small left pleural effusion left basilar atelectasis or or infiltrate. No segmental infiltrates. Right lung is clear.  IMPRESSION: Mild  interstitial prominence bilateral suspicious for mild interstitial edema. Small left pleural effusion with left basilar atelectasis or infiltrate. Right lung is clear.   Electronically Signed   By: Lahoma Crocker M.D.   On: 01/09/2015 12:33   Dg Chest Port 1 View  01/08/2015   CLINICAL DATA:  Dizziness and right arm tingling  EXAM: PORTABLE CHEST - 1 VIEW  COMPARISON:  None.  FINDINGS: There is a degree of underlying emphysematous change. There is generalized interstitial prominence without airspace consolidation. The heart is upper normal in size with mild pulmonary venous hypertension. No adenopathy. No bone lesions. There is calcification in the right carotid artery.  IMPRESSION: Findings felt to represent a degree of congestive heart failure superimposed on emphysematous change. No airspace consolidation. Calcification right carotid artery.   Electronically Signed   By: Lowella Grip III M.D.   On: 01/08/2015 08:41   Results for orders placed or performed during the hospital encounter of 01/12/15  CBC  Result Value Ref Range   WBC 8.5 4.0 - 10.5 K/uL   RBC 3.82 (L) 3.87 - 5.11 MIL/uL   Hemoglobin 11.5 (L) 12.0 - 15.0 g/dL   HCT 34.2 (L) 36.0 - 46.0 %   MCV 89.5 78.0 - 100.0 fL   MCH 30.1 26.0 - 34.0 pg   MCHC 33.6 30.0 - 36.0 g/dL   RDW 13.5 11.5 - 15.5 %   Platelets 140 (L) 150 - 400 K/uL  Basic metabolic panel  Result Value Ref Range   Sodium 134 (L) 135 - 145 mmol/L   Potassium 3.5 3.5 - 5.1 mmol/L   Chloride 97 96 - 112 mmol/L   CO2 21 19 - 32 mmol/L   Glucose, Bld 84 70 - 99 mg/dL   BUN 5 (L) 6 - 23 mg/dL   Creatinine, Ser 0.73 0.50 - 1.10 mg/dL   Calcium 8.9 8.4 - 10.5 mg/dL   GFR calc non Af Amer 85 (L) >90 mL/min   GFR calc Af Amer >90 >90 mL/min   Anion gap 16 (H) 5 - 15  BNP (order ONLY if patient complains of dyspnea/SOB AND you have documented it for THIS visit)  Result Value Ref Range   B Natriuretic Peptide 345.8 (H) 0.0 - 100.0 pg/mL  I-stat troponin, ED (not at  [facility])  Result Value Ref Range   Troponin i, poc <0.20 (HH) 0.00 - 0.08 ng/mL   Comment 3          I-stat chem 8, ed  Result Value Ref Range   Sodium 136 135 - 145 mmol/L   Potassium 3.5 3.5 - 5.1 mmol/L   Chloride 96 96 - 112 mmol/L   BUN 7 6 - 23 mg/dL   Creatinine, Ser 0.50 0.50 - 1.10 mg/dL   Glucose, Bld 80 70 - 99 mg/dL   Calcium, Ion 1.09 (L) 1.13 - 1.30 mmol/L   TCO2 23 0 - 100 mmol/L   Hemoglobin 13.3 12.0 - 15.0 g/dL   HCT 39.0 36.0 - 46.0 %   Dg Chest 2 View  01/11/2015   CLINICAL DATA:  Hypotension and dehydration. History of breast cancer.  EXAM: CHEST  2 VIEW  COMPARISON:  01/09/2015.  FINDINGS: Worsening interstitial edema. Cardiomegaly.  Congestive heart failure not excluded. No areas of focal consolidation. Small BILATERAL effusions. LEFT mastectomy. No osseous findings.  IMPRESSION: Worsening aeration. BILATERAL interstitial prominence consistent with early congestive failure.   Electronically Signed   By: Rolla Flatten M.D.   On: 01/11/2015 14:17   X-ray Chest Pa And Lateral  01/09/2015   CLINICAL DATA:  Sepsis, breast cancer  EXAM: CHEST  2 VIEW  COMPARISON:  01/08/2015  FINDINGS: Cardiomediastinal silhouette is stable. Persistent mild interstitial prominence bilaterally. Mild interstitial edema cannot be excluded. Small left pleural effusion left basilar atelectasis or or infiltrate. No segmental infiltrates. Right lung is clear.  IMPRESSION: Mild interstitial prominence bilateral suspicious for mild interstitial edema. Small left pleural effusion with left basilar atelectasis or infiltrate. Right lung is clear.   Electronically Signed   By: Lahoma Crocker M.D.   On: 01/09/2015 12:33   Dg Chest Port 1 View  01/08/2015   CLINICAL DATA:  Dizziness and right arm tingling  EXAM: PORTABLE CHEST - 1 VIEW  COMPARISON:  None.  FINDINGS: There is a degree of underlying emphysematous change. There is generalized interstitial prominence without airspace consolidation. The heart is upper  normal in size with mild pulmonary venous hypertension. No adenopathy. No bone lesions. There is calcification in the right carotid artery.  IMPRESSION: Findings felt to represent a degree of congestive heart failure superimposed on emphysematous change. No airspace consolidation. Calcification right carotid artery.   Electronically Signed   By: Lowella Grip III M.D.   On: 01/08/2015 08:41   5:40 PM Pulse 140 after treatment with intravenous Cardizem patient remains asymptomatic MDM  Cardizem intravenous dose ordered, Tylenol ordered. Cardiology consulted to see patient in the emergency department. In light of fever cardiology recommends medical inpatient stay. I consult hospitalist to see patient in ED and arrange for inpatient stay Final diagnoses:  None   Diagnosis #1 supraventricular tachycardia #2 fever     Orlie Dakin, MD 01/12/15 [phone]

## 2015-01-12 NOTE — H&P (Signed)
[facility] History and Physical  Shawnae Leiva LOV:[id number] DOB: 08/10/1946 DOA: 01/12/2015  Referring physician: Dr. Milagros Evener PCP: Odette Fraction, MD   Chief Complaint: Palpitations and fever  HPI: Gina [name] is a 69 y.o. female with known history of diabetes mellitus, hypertension who was admitted on 01/08/2015 the concern of sepsis. Workup of with blood cultures, urine cultures, influenza screen was negative. Patient was initially hypothermic however improved significantly by 01/10/2015. Patient was discharged home in stable condition. Patient was given IV antibiotics during the stay. As the cultures came back to be negative the antibiotics were discontinued as there was no source of infection. Patient was found to have questionable left lower lobe atelectasis or infiltrate. Patient states has mild cough without any shortness of breath. Patient comes back the same day with the atrial fibrillation with rapid ventricular rate and was admitted to the cardiology service. Patient was discharged home with the beta blocker and eliquis. Patient's heart rate was well controlled prior to discharge. Patient was discharged on 01/11/2015. Echocardiogram showed preserved left ventricular ejection fraction. Since this afternoon patient started to experience palpitations concerning this came back to the emergency department. Patient in the patient was initially having heart rate over 100  Aflutter. Cardiology was consulted in the emergency department and was given flecainide. He shows heart rate is well controlled at this time and converted to normal sinus rhythm. In the emergency department patient was noted to have fever of 102. No obvious source of infection is found. Patient recently had urinalysis and urine cultures which came back negative. Denies having any abdominal pain, diarrhea, nausea and vomiting. Has normal white blood cell count but has a left shift of 83%. Denies having any chest  pain. During previous admission patient was found how mild elevation of the troponins which was thought to be from demand ischemia.    Review of Systems:  Constitutional:  No weight loss, night sweats, Fevers, chills, fatigue.  HEENT:  No headaches, Difficulty swallowing,Tooth/dental problems,Sore throat,  No sneezing, itching, ear ache, nasal congestion, post nasal drip,  Cardio-vascular:  No chest pain, Orthopnea, PND, swelling in lower extremities, anasarca, dizziness, palpitations  GI:  No heartburn, indigestion, abdominal pain, nausea, vomiting, diarrhea, change in bowel habits, loss of appetite  Resp:  No shortness of breath with exertion or at rest. No excess mucus, no productive cough, No non-productive cough, No coughing up of blood.No change in color of mucus.No wheezing.No chest wall deformity  Skin:  no rash or lesions.  GU:  no dysuria, change in color of urine, no urgency or frequency. No flank pain.  Musculoskeletal:  No joint pain or swelling. No decreased range of motion. No back pain.  Psych:  No change in mood or affect. No depression or anxiety. No memory loss.   Past Medical History  Diagnosis Date  . Ovarian cyst   . Hypertension   . Insulin pump in place   . 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 Anxiety     new dx  . Hyperlipidemia   . S/P radiation therapy 04/10/12- 05/26/2012    left Breast and Axilla / 50 gy / 25 Fractions with Left Breast Boost / 10 Gy / 5 Fractions  . Use of letrozole (Femara) 06/13/12  . Diabetes mellitus type 1     Insulin pump  . Hypothyroidism   . Diastolic CHF     a. 05/8415 -  due to AF RVR.  Marland Kitchen Atrial fibrillation    Past Surgical History  Procedure Laterality Date  . Tonsillectomy    . Ovarian cyst surgery    . Colonoscopy      1 polyp   . 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 Appendectomy     Social History:  reports that she has never smoked. She does not have any smokeless tobacco history on file. She reports that she does not drink alcohol or use illicit drugs.  Allergies  Allergen Reactions  . Statins Other (See Comments)    Leg pain    Family History  Problem Relation Age of Onset  . Breast cancer Mother 8  . Prostate cancer Maternal Uncle     died in his 43s     Prior to Admission medications   Medication Sig Start Date End Date Taking? Authorizing Provider  acetaminophen (TYLENOL) 325 MG tablet Take 2 tablets (650 mg total) by mouth every 4 (four) hours as needed for headache or mild pain. 01/12/15  Yes Erlene Quan, PA-C  apixaban (ELIQUIS) 5 MG TABS tablet Take 1 tablet (5 mg total) by mouth 2 (two) times daily. 01/12/15  Yes Erlene Quan, PA-C  aspirin EC 81 MG tablet Take 81 mg by mouth daily.   Yes Historical Provider, MD  glucose 4 GM chewable tablet Chew 16 g by mouth as needed for low blood sugar.    Yes Historical Provider, MD  insulin lispro (HUMALOG) 100 UNIT/ML injection Basal rate 0.9 7am-12, 0.8 Noon-7am. Max of 60 units SQ every day with pump mer MD DX CODE: E10.65 Patient taking differently: Insulin pump - Basal rate from 12 am-4:59 am at 0.45 units/hr, 5 am-6:59 am at 0.9 units/hr, 7 am-9:59 am at 1 unit/hr, 10 am-11:59 am at 1.1 units/hr, 12 pm-3:59 pm at 0.9 units/hr, 4 pm-8:59 at 0.8 units/hr, 9 pm-11:59 pm at 0.75 units/hr. Max of 100 units SQ every day; max of 50 units SQ in a 2 hr time frame with pump mer MD DX CODE: E10.65 12/31/14  Yes Elayne Snare, MD  metoprolol tartrate (LOPRESSOR) 25 MG tablet Take 0.5 tablets (12.5 mg total) by mouth 2 (two) times daily. 01/12/15  Yes Erlene Quan, PA-C  Misc Natural Products (PROGESTERONE EX) Apply 1 application topically daily. Apply 4 clicks to chest  daily. - compounded at [facility]   Yes Historical Provider, MD  NALTREXONE HCL PO Take 4.5 mg by mouth daily.    Yes Historical Provider, MD  naproxen sodium (ANAPROX) 220 MG tablet Take 220 mg by mouth 2 (two) times daily as needed (pain). Aleve   Yes Historical Provider, MD  thyroid (ARMOUR) 30 MG tablet Take 30-60 mg by mouth daily before breakfast. Take 1 tablet (30 mg) on day 1, then take 2 tablets (60 mg) on day 2, then repeat   Yes Historical Provider, MD  acidophilus (RISAQUAD) CAPS capsule Take 1 capsule by mouth daily. 01/10/15   Cherene Altes, MD  Ascorbic Acid (VITAMIN C) 1000 MG tablet Take 1,000-2,000 mg by mouth 2 (two) times daily. Take 1 tablet (1000 mg) twice daily on day 1, then 1 tablet (1000 mg) every morning and 2 tablets (2000 mg) at dinner on day 2, then repeat    Historical Provider, MD  Cholecalciferol (VITAMIN D-3)  5000 UNITS TABS Take 5,000 Units by mouth daily.     Historical Provider, MD  [facility] Liver Oil 1000 MG CAPS Take 2,000 mg by mouth 2 (two) times daily.     Historical Provider, MD  Coenzyme Q10 (COQ10) 200 MG CAPS Take 200 mg by mouth daily.    Historical Provider, MD  cyanocobalamin 1000 MCG tablet Take 1,000 mcg by mouth daily.     Historical Provider, MD  magnesium oxide (MAG-OX) 400 MG tablet Take 400 mg by mouth daily.    Historical Provider, MD  Misc Natural Products (GLUCOSAMINE CHONDROITIN MSM PO) Take 1 tablet by mouth daily.    Historical Provider, MD  Red Yeast Rice 600 MG TABS Take 1,200 mg by mouth 2 (two) times daily.     Historical Provider, MD   Physical Exam: Filed Vitals:   01/12/15 1745 01/12/15 1800 01/12/15 1815 01/12/15 1830  BP: 122/57 126/58 119/51 129/61  Pulse: 75 79 77 78  Temp:      TempSrc:      Resp: _0 SpO2: 93% 96% 93% 95%    Wt Readings from Last 3 Encounters:  01/10/15 69.5 kg (153 lb 3.5 oz)  09/29/14 65.137 kg (143 lb 9.6 oz)  07/06/14 66.407 kg (146 lb 6.4 oz)    General:  Appears calm and  comfortable Eyes: PERRL, normal lids, irises & conjunctiva ENT: grossly normal hearing, lips & tongue Neck: no LAD, masses or thyromegaly Cardiovascular: RRR, no m/r/g. No LE edema. Telemetry: SR, no arrhythmias  Respiratory: CTA bilaterally, no w/r/r. Normal respiratory effort. Abdomen: soft, ntnd Skin: no rash or induration seen on limited exam Musculoskeletal: grossly normal tone BUE/BLE Psychiatric: grossly normal mood and affect, speech fluent and appropriate Neurologic: grossly non-focal.          Labs on Admission:  Basic Metabolic Panel:  Recent Labs Lab 01/08/15 0641 01/09/15 0305 01/11/15 1207 01/11/15 1320 01/12/15 0300 01/12/15 1611 01/12/15 1644  NA 137 136  --  132* 136 134* 136  K 4.2 3.4*  --  4.0 3.5 3.5 3.5  CL 103 107  --  100 103 97 96  CO2 23 22  --  _1 --   GLUCOSE 221* 100* 191* 181* 117* 84 80  BUN 15 15  --  10 7 5* 7  CREATININE 1.16* 0.82  --  0.91 0.66 0.73 0.50  CALCIUM 8.3* 7.6*  --  8.4 8.3* 8.9  --    Liver Function Tests:  Recent Labs Lab 01/08/15 0641 01/09/15 0305  AST 65* 51*  ALT 41* 42*  ALKPHOS 71 55  BILITOT 0.6 0.9  PROT 5.2* 4.9*  ALBUMIN 3.0* 2.6*   No results for input(s): LIPASE, AMYLASE in the last 168 hours. No results for input(s): AMMONIA in the last 168 hours. CBC:  Recent Labs Lab 01/08/15 0641 01/09/15 0305 01/11/15 1320 01/12/15 0300 01/12/15 1611 01/12/15 1644  WBC 13.0* 7.9 9.3 4.0 8.5  --   NEUTROABS 11.6*  --   --   --   --   --   HGB 10.4* 9.7* 10.7* 9.5* 11.5* 13.3  HCT 32.2* 29.1* 32.6* 28.4* 34.2* 39.0  MCV 91.2 90.4 91.8 89.6 89.5  --   PLT 124* 145* 110* 113* 140*  --    Cardiac Enzymes:  Recent Labs Lab 01/09/15 1602 01/09/15 2100 01/11/15 1958 01/12/15 0117 01/12/15 0717  TROPONINI 0.82* 0.75* 0.22* 0.19* 0.14*    BNP (last 3 results)  Recent Labs  01/12/15 1611  BNP 345.8*    ProBNP (last 3 results) No results for input(s): PROBNP in the last 8760  hours.  CBG:  Recent Labs Lab 01/11/15 1510 01/11/15 2157 01/12/15 0159 01/12/15 0611 01/12/15 0723  GLUCAP 167* 123* 122* 122* 99    Radiological Exams on Admission: Dg Chest 2 View  01/11/2015   CLINICAL DATA:  Hypotension and dehydration. History of breast cancer.  EXAM: CHEST  2 VIEW  COMPARISON:  01/09/2015.  FINDINGS: Worsening interstitial edema. Cardiomegaly. Congestive heart failure not excluded. No areas of focal consolidation. Small BILATERAL effusions. LEFT mastectomy. No osseous findings.  IMPRESSION: Worsening aeration. BILATERAL interstitial prominence consistent with early congestive failure.   Electronically Signed   By: Rolla Flatten M.D.   On: 01/11/2015 14:17    EKG: Independently reviewed. A.flutter 2:1 block  Assessment/Plan Principal Problem:   Sepsis Active Problems:   Essential hypertension   Diabetes type 2, controlled   Atrial fibrillation with RVR   Hypothyroidism   1. Sepsis of unknown origin - Patient had blood cultures and urine cultures were done which came back to be negative from 01/08/2015 -Chest x-ray questionable left lower lobe infiltrate -We'll treat it as community-acquired pneumonia -Continue the Rocephin and Zithromax. -Also concerned about a viral etiology. Send CMV antibodies  2. Atrial fibrillation with a rapid ventricular rate -Rate is controlled with by mouth Cardizem and flecainide -Patient is a seen by cardiology in the emergency department -Discontinue beta blocker and continue the Cardizem and flecainide -Continue with eliquis  3. Diabetes mellitus insulin-dependent -Continue with home dose of Lantus and sliding scale insulin  4. Hypothyroidism -Continue the Synthroid.  5. Hypertension - Cont with cardizem.    Code Status: Full DVT Prophylaxis: On Eliquis Family Communication: Patient and husband Disposition Plan: Home. Time spent: 50 minutes  [facility] Pager [phone]

## 2015-01-12 NOTE — Discharge Instructions (Signed)
Stroke Prevention Some medical conditions and behaviors are associated with an increased chance of having a stroke. You may prevent a stroke by making healthy choices and managing medical conditions. HOW CAN I REDUCE MY RISK OF HAVING A STROKE?   Stay physically active. Get at least 30 minutes of activity on most or all days.  Do not smoke. It may also be helpful to avoid exposure to secondhand smoke.  Limit alcohol use. Moderate alcohol use is considered to be:  No more than 2 drinks per day for men.  No more than 1 drink per day for nonpregnant women.  Eat healthy foods. This involves:  Eating 5 or more servings of fruits and vegetables a day.  Making dietary changes that address high blood pressure (hypertension), high cholesterol, diabetes, or obesity.  Manage your cholesterol levels.  Making food choices that are high in fiber and low in saturated fat, trans fat, and cholesterol may control cholesterol levels.  Take any prescribed medicines to control cholesterol as directed by your health care provider.  Manage your diabetes.  Controlling your carbohydrate and sugar intake is recommended to manage diabetes.  Take any prescribed medicines to control diabetes as directed by your health care provider.  Control your hypertension.  Making food choices that are low in salt (sodium), saturated fat, trans fat, and cholesterol is recommended to manage hypertension.  Take any prescribed medicines to control hypertension as directed by your health care provider.  Maintain a healthy weight.  Reducing calorie intake and making food choices that are low in sodium, saturated fat, trans fat, and cholesterol are recommended to manage weight.  Stop drug abuse.  Avoid taking birth control pills.  Talk to your health care provider about the risks of taking birth control pills if you are over 7 years old, smoke, get migraines, or have ever had a blood clot.  Get evaluated for sleep  disorders (sleep apnea).  Talk to your health care provider about getting a sleep evaluation if you snore a lot or have excessive sleepiness.  Take medicines only as directed by your health care provider.  For some people, aspirin or blood thinners (anticoagulants) are helpful in reducing the risk of forming abnormal blood clots that can lead to stroke. If you have the irregular heart rhythm of atrial fibrillation, you should be on a blood thinner unless there is a good reason you cannot take them.  Understand all your medicine instructions.  Make sure that other conditions (such as anemia or atherosclerosis) are addressed. SEEK IMMEDIATE MEDICAL CARE IF:   You have sudden weakness or numbness of the face, arm, or leg, especially on one side of the body.  Your face or eyelid droops to one side.  You have sudden confusion.  You have trouble speaking (aphasia) or understanding.  You have sudden trouble seeing in one or both eyes.  You have sudden trouble walking.  You have dizziness.  You have a loss of balance or coordination.  You have a sudden, severe headache with no known cause.  You have new chest pain or an irregular heartbeat. Any of these symptoms may represent a serious problem that is an emergency. Do not wait to see if the symptoms will go away. Get medical help at once. Call your local emergency services (911 in [place] Do not drive yourself to the hospital. Document Released: 11/08/2004 Document Revised: 02/15/2014 Document Reviewed: 04/03/2013 [facility] Patient Information 2015 [place], [place]. This information is not intended to replace advice given  to you by your health care provider. Make sure you discuss any questions you have with your health care provider. Atrial Fibrillation Atrial fibrillation is a type of irregular heart rhythm (arrhythmia). During atrial fibrillation, the upper chambers of the heart (atria) quiver continuously in a chaotic pattern. This causes  an irregular and often rapid heart rate.  Atrial fibrillation is the result of the heart becoming overloaded with disorganized signals that tell it to beat. These signals are normally released one at a time by a part of the right atrium called the sinoatrial node. They then travel from the atria to the lower chambers of the heart (ventricles), causing the atria and ventricles to contract and pump blood as they pass. In atrial fibrillation, parts of the atria outside of the sinoatrial node also release these signals. This results in two problems. First, the atria receive so many signals that they do not have time to fully contract. Second, the ventricles, which can only receive one signal at a time, beat irregularly and out of rhythm with the atria.  There are three types of atrial fibrillation:   Paroxysmal. Paroxysmal atrial fibrillation starts suddenly and stops on its own within a week.  Persistent. Persistent atrial fibrillation lasts for more than a week. It may stop on its own or with treatment.  Permanent. Permanent atrial fibrillation does not go away. Episodes of atrial fibrillation may lead to permanent atrial fibrillation. Atrial fibrillation can prevent your heart from pumping blood normally. It increases your risk of stroke and can lead to heart failure.  CAUSES   Heart conditions, including a heart attack, heart failure, coronary artery disease, and heart valve conditions.   Inflammation of the sac that surrounds the heart (pericarditis).  Blockage of an artery in the lungs (pulmonary embolism).  Pneumonia or other infections.  Chronic lung disease.  Thyroid problems, especially if the thyroid is overactive (hyperthyroidism).  Caffeine, excessive alcohol use, and use of some illegal drugs.   Use of some medicines, including certain decongestants and diet pills.  Heart surgery.   Birth defects.  Sometimes, no cause can be found. When this happens, the atrial  fibrillation is called lone atrial fibrillation. The risk of complications from atrial fibrillation increases if you have lone atrial fibrillation and you are age 55 years or older. RISK FACTORS  Heart failure.  Coronary artery disease.  Diabetes mellitus.   High blood pressure (hypertension).   Obesity.   Other arrhythmias.   Increased age. SIGNS AND SYMPTOMS   A feeling that your heart is beating rapidly or irregularly.   A feeling of discomfort or pain in your chest.   Shortness of breath.   Sudden light-headedness or weakness.   Getting tired easily when exercising.   Urinating more often than normal (mainly when atrial fibrillation first begins).  In paroxysmal atrial fibrillation, symptoms may start and suddenly stop. DIAGNOSIS  Your health care provider may be able to detect atrial fibrillation when taking your pulse. Your health care provider may have you take a test called an ambulatory electrocardiogram (ECG). An ECG records your heartbeat patterns over a 24-hour period. You may also have other tests, such as:  Transthoracic echocardiogram (TTE). During echocardiography, sound waves are used to evaluate how blood flows through your heart.  Transesophageal echocardiogram (TEE).  Stress test. There is more than one type of stress test. If a stress test is needed, ask your health care provider about which type is best for you.  Chest X-ray exam.  Blood tests.  Computed tomography (CT). TREATMENT  Treatment may include:  Treating any underlying conditions. For example, if you have an overactive thyroid, treating the condition may correct atrial fibrillation.  Taking medicine. Medicines may be given to control a rapid heart rate or to prevent blood clots, heart failure, or a stroke.  Having a procedure to correct the rhythm of the heart:  Electrical cardioversion. During electrical cardioversion, a controlled, low-energy shock is delivered to the  heart through your skin. If you have chest pain, very low blood pressure, or sudden heart failure, this procedure may need to be done as an emergency.  Catheter ablation. During this procedure, heart tissues that send the signals that cause atrial fibrillation are destroyed.  Surgical ablation. During this surgery, thin lines of heart tissue that carry the abnormal signals are destroyed. This procedure can either be an open-heart surgery or a minimally invasive surgery. With the minimally invasive surgery, small cuts are made to access the heart instead of a large opening.  Pulmonary venous isolation. During this surgery, tissue around the veins that carry blood from the lungs (pulmonary veins) is destroyed. This tissue is thought to carry the abnormal signals. HOME CARE INSTRUCTIONS   Take medicines only as directed by your health care provider. Some medicines can make atrial fibrillation worse or recur.  If blood thinners were prescribed by your health care provider, take them exactly as directed. Too much blood-thinning medicine can cause bleeding. If you take too little, you will not have the needed protection against stroke and other problems.  Perform blood tests at home if directed by your health care provider. Perform blood tests exactly as directed.  Quit smoking if you smoke.  Do not drink alcohol.  Do not drink caffeinated beverages such as coffee, soda, and some teas. You may drink decaffeinated coffee, soda, or tea.   Maintain a healthy weight.Do not use diet pills unless your health care provider approves. They may make heart problems worse.   Follow diet instructions as directed by your health care provider.  Exercise regularly as directed by your health care provider.  Keep all follow-up visits as directed by your health care provider. This is important. PREVENTION  The following substances can cause atrial fibrillation to recur:   Caffeinated  beverages.  Alcohol.  Certain medicines, especially those used for breathing problems.  Certain herbs and herbal medicines, such as those containing ephedra or ginseng.  Illegal drugs, such as cocaine and amphetamines. Sometimes medicines are given to prevent atrial fibrillation from recurring. Proper treatment of any underlying condition is also important in helping prevent recurrence.  SEEK MEDICAL CARE IF:  You notice a change in the rate, rhythm, or strength of your heartbeat.  You suddenly begin urinating more frequently.  You tire more easily when exerting yourself or exercising. SEEK IMMEDIATE MEDICAL CARE IF:   You have chest pain, abdominal pain, sweating, or weakness.  You feel nauseous.  You have shortness of breath.  You suddenly have swollen feet and ankles.  You feel dizzy.  Your face or limbs feel numb or weak.  You have a change in your vision or speech. MAKE SURE YOU:   Understand these instructions.  Will watch your condition.  Will get help right away if you are not doing well or get worse. Document Released: 10/01/2005 Document Revised: 02/15/2014 Document Reviewed: 11/11/2012 [facility] Patient Information 2015 [place], [place]. This information is not intended to replace advice given to you by your health care  provider. Make sure you discuss any questions you have with your health care provider. ° °

## 2015-01-12 NOTE — Consult Note (Signed)
Electrophysiology Consultation Note  Patient ID: Gina [name], MRN: [id number], DOB/AGE: [birth year] 69 y.o. Admit date: 01/12/2015   Date of Consult: 01/12/2015 Primary Physician: Odette Fraction, MD Primary Cardiologist: Dr. Debara Pickett (new)  Chief Complaint: palpitations Reason for Consultation: atrial flutter  HPI: Gina [name] is a 69 y/o F with recently diagnosed AF RVR (discharged this AM) with subsequent diastolic CHF, HTN, HLD, DM type 1 on insulin pump, hypothyroidism, breast CA who presents back to [facility] with palpitations. She was recently admitted 01/08/15 with "severe sepsis" with hypotension into the 60s of unclear cause, HR 30s, treated with IV fluids and antibiotic therapy (blood and urine culture negative). She was discharged 01/10/15.2D Echo 3/28: EF 55-60%, no RWMA, trace MR and mild TR, PASP 78mmHg. She was at her PCP's office for followup 01/11/15 when she had a episode of dizziness and was found to be in atrial fibrillation with a rapid ventricular response. She was admitted to [facility] for further evaluation. She spontaneously converted to NSR on IV diltiazem and started started on low dose BB. Her ARB changed to Lopressor and Eliquis added. She has had an elevated troponin felt to be secondary to demand ischemia, possible plan for outpatient nuclear stress test. Her CXR suggested vascular congestion and she received one dose of po Lasix. She was discharged this AM in NSR. She has had 2 doses of Eliquis so far.  She went home and was feeling OK but this afternoon developed recurrence of tachypalpitations. She also reports feeling fatigued and has a headache. Temp 102.6, confirmed with recheck. She feels cold but denies overt rigors, malaise or nasal symptoms. On telemetry she has gone in and out of atrial flutter. Labs also notable for Hgb 11.5, Plt 140 (although Hgb earlier this AM was ?9.5 with platelets 113), Istat chem8 pending. BMET earlier this AM showed Na 136, K 3.5,  BUN 7, Cr 0.66. TSH normal on 3/27.  Note Hgb 12.8 in 08/2014, was 10.4 on last admission 01/08/15. Denies any unusual bleeding. She does endorse some chest discomfort upon inspiration. Had 9 hour flight to [place] on February 23. No recent LEE, LE erythema, pain, dyspnea or recent sick contacts.   Past Medical History  Diagnosis Date  . Ovarian cyst   . Hypertension   . Insulin pump in place   . 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 Anxiety     new dx  . Hyperlipidemia   . S/P radiation therapy 04/10/12- 05/26/2012    left Breast and Axilla / 50 gy / 25 Fractions with Left Breast Boost / 10 Gy / 5 Fractions  . Use of letrozole (Femara) 06/13/12  . Diabetes mellitus type 1     Insulin pump  . Hypothyroidism   . Diastolic CHF     a. 06/2118 - due to AF RVR.  Marland Kitchen Atrial fibrillation       Most Recent Cardiac Studies: 2D Echo 01/10/15 Study Conclusions - Left ventricle: The cavity size was normal. Wall thickness was normal. Systolic function was normal. The estimated ejection fraction was in the range of 55% to 60%. Wall motion was normal; there were no regional wall motion abnormalities. Left ventricular diastolic function parameters were normal. - Pulmonary arteries: PA peak pressure: 39 mm Hg (S). Impressions: - Normal LV function; trace MR and mild TR.   Surgical History:  Past Surgical History  Procedure Laterality Date  .  Tonsillectomy    . Ovarian cyst surgery    . Colonoscopy      1 polyp   . 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 Appendectomy       Home Meds: Prior to Admission medications   Medication Sig Start Date End Date Taking? Authorizing Provider  acetaminophen (TYLENOL) 325 MG  tablet Take 2 tablets (650 mg total) by mouth every 4 (four) hours as needed for headache or mild pain. 01/12/15  Yes Gina Quan, PA-C  apixaban (ELIQUIS) 5 MG TABS tablet Take 1 tablet (5 mg total) by mouth 2 (two) times daily. 01/12/15  Yes Gina Quan, PA-C  aspirin EC 81 MG tablet Take 81 mg by mouth daily.   Yes Historical Provider, MD  glucose 4 GM chewable tablet Chew 16 g by mouth as needed for low blood sugar.    Yes Historical Provider, MD  insulin lispro (HUMALOG) 100 UNIT/ML injection Basal rate 0.9 7am-12, 0.8 Noon-7am. Max of 60 units SQ every day with pump mer MD DX CODE: E10.65 Patient taking differently: Insulin pump - Basal rate from 12 am-4:59 am at 0.45 units/hr, 5 am-6:59 am at 0.9 units/hr, 7 am-9:59 am at 1 unit/hr, 10 am-11:59 am at 1.1 units/hr, 12 pm-3:59 pm at 0.9 units/hr, 4 pm-8:59 at 0.8 units/hr, 9 pm-11:59 pm at 0.75 units/hr. Max of 100 units SQ every day; max of 50 units SQ in a 2 hr time frame with pump mer MD DX CODE: E10.65 12/31/14  Yes Gina Snare, MD  metoprolol tartrate (LOPRESSOR) 25 MG tablet Take 0.5 tablets (12.5 mg total) by mouth 2 (two) times daily. 01/12/15  Yes Gina Quan, PA-C  Misc Natural Products (PROGESTERONE EX) Apply 1 application topically daily. Apply 4 clicks to chest daily. - compounded at [facility]   Yes Historical Provider, MD  NALTREXONE HCL PO Take 4.5 mg by mouth daily.    Yes Historical Provider, MD  naproxen sodium (ANAPROX) 220 MG tablet Take 220 mg by mouth 2 (two) times daily as needed (pain). Aleve   Yes Historical Provider, MD  thyroid (ARMOUR) 30 MG tablet Take 30-60 mg by mouth daily before breakfast. Take 1 tablet (30 mg) on day 1, then take 2 tablets (60 mg) on day 2, then repeat   Yes Historical Provider, MD  acidophilus (RISAQUAD) CAPS capsule Take 1 capsule by mouth daily. 01/10/15   Cherene Altes, MD  Ascorbic Acid (VITAMIN C) 1000 MG tablet Take 1,000-2,000 mg by mouth 2 (two) times daily. Take 1 tablet  (1000 mg) twice daily on day 1, then 1 tablet (1000 mg) every morning and 2 tablets (2000 mg) at dinner on day 2, then repeat    Historical Provider, MD  Cholecalciferol (VITAMIN D-3) 5000 UNITS TABS Take 5,000 Units by mouth daily.     Historical Provider, MD  [facility] Liver Oil 1000 MG CAPS Take 2,000 mg by mouth 2 (two) times daily.     Historical Provider, MD  Coenzyme Q10 (COQ10) 200 MG CAPS Take 200 mg by mouth daily.    Historical Provider, MD  cyanocobalamin 1000 MCG tablet Take 1,000 mcg by mouth daily.     Historical Provider, MD  magnesium oxide (MAG-OX) 400 MG tablet Take 400 mg by mouth daily.    Historical Provider, MD  Misc Natural  Products (GLUCOSAMINE CHONDROITIN MSM PO) Take 1 tablet by mouth daily.    Historical Provider, MD  Red Yeast Rice 600 MG TABS Take 1,200 mg by mouth 2 (two) times daily.     Historical Provider, MD    Inpatient Medications:    . diltiazem      Allergies:  Allergies  Allergen Reactions  . Statins Other (See Comments)    Leg pain    History   Social History  . Marital Status: Married    Spouse Name: N/A  . Number of Children: N/A  . Years of Education: N/A   Occupational History  . Not on file.   Social History Main Topics  . Smoking status: Never Smoker   . Smokeless tobacco: Not on file  . Alcohol Use: No     Comment: menarche age 61,menopause age 35,hrt short time oral,then progesterone cream ,stopped g3,p3,1st age 49  . Drug Use: No  . Sexual Activity: Yes   Other Topics Concern  . Not on file   Social History Narrative     Family History  Problem Relation Age of Onset  . Breast cancer Mother 51  . Prostate cancer Maternal Uncle     died in his 27s     Review of Systems: All other systems reviewed and are otherwise negative except as noted above.  Labs:  Recent Labs  01/09/15 2100 01/11/15 1958 01/12/15 0117 01/12/15 0717  TROPONINI 0.75* 0.22* 0.19* 0.14*   Lab Results  Component Value Date   WBC 8.5  01/12/2015   HGB 11.5* 01/12/2015   HCT 34.2* 01/12/2015   MCV 89.5 01/12/2015   PLT 140* 01/12/2015    Recent Labs Lab 01/09/15 0305  01/12/15 0300  NA 136  < > 136  K 3.4*  < > 3.5  CL 107  < > 103  CO2 22  < > 26  BUN 15  < > 7  CREATININE 0.82  < > 0.66  CALCIUM 7.6*  < > 8.3*  PROT 4.9*  --   --   BILITOT 0.9  --   --   ALKPHOS 55  --   --   ALT 42*  --   --   AST 51*  --   --   GLUCOSE 100*  < > 117*  < > = values in this interval not displayed. Lab Results  Component Value Date   CHOL 165 01/12/2015   HDL 54 01/12/2015   LDLCALC 100* 01/12/2015   TRIG 56 01/12/2015   No results found for: DDIMER  Radiology/Studies:  Dg Chest 2 View  01/11/2015   CLINICAL DATA:  Hypotension and dehydration. History of breast cancer.  EXAM: CHEST  2 VIEW  COMPARISON:  01/09/2015.  FINDINGS: Worsening interstitial edema. Cardiomegaly. Congestive heart failure not excluded. No areas of focal consolidation. Small BILATERAL effusions. LEFT mastectomy. No osseous findings.  IMPRESSION: Worsening aeration. BILATERAL interstitial prominence consistent with early congestive failure.   Electronically Signed   By: Rolla Flatten M.D.   On: 01/11/2015 14:17   X-ray Chest Pa And Lateral  01/09/2015   CLINICAL DATA:  Sepsis, breast cancer  EXAM: CHEST  2 VIEW  COMPARISON:  01/08/2015  FINDINGS: Cardiomediastinal silhouette is stable. Persistent mild interstitial prominence bilaterally. Mild interstitial edema cannot be excluded. Small left pleural effusion left basilar atelectasis or or infiltrate. No segmental infiltrates. Right lung is clear.  IMPRESSION: Mild interstitial prominence bilateral suspicious for mild interstitial edema. Small left pleural effusion  with left basilar atelectasis or infiltrate. Right lung is clear.   Electronically Signed   By: Lahoma Crocker M.D.   On: 01/09/2015 12:33   Dg Chest Port 1 View  01/08/2015   CLINICAL DATA:  Dizziness and right arm tingling  EXAM: PORTABLE CHEST -  1 VIEW  COMPARISON:  None.  FINDINGS: There is a degree of underlying emphysematous change. There is generalized interstitial prominence without airspace consolidation. The heart is upper normal in size with mild pulmonary venous hypertension. No adenopathy. No bone lesions. There is calcification in the right carotid artery.  IMPRESSION: Findings felt to represent a degree of congestive heart failure superimposed on emphysematous change. No airspace consolidation. Calcification right carotid artery.   Electronically Signed   By: Lowella Grip III M.D.   On: 01/08/2015 08:41   EKG: suspected atrial flutter 2:1 150bpm nonspecific changes  Physical Exam: Blood pressure 149/83, pulse 142, temperature 102 F (38.9 C), temperature source Oral, resp. rate 20, SpO2 95 %. General: Well developed, well nourished WF, in no acute distress. Head: Normocephalic, atraumatic, sclera non-icteric, no xanthomas, nares are without discharge.  Neck: JVD not elevated. Lungs: Clear bilaterally to auscultation without wheezes, rales, or rhonchi. Breathing is unlabored. Heart: Tachycardic, regular, with S1 S2. No murmurs, rubs, or gallops appreciated. Abdomen: Soft, non-tender, non-distended with normoactive bowel sounds. No hepatomegaly. No rebound/guarding. No obvious abdominal masses. Msk:  Strength and tone appear normal for age. Extremities: No clubbing or cyanosis. No edema. Distal pedal pulses are 2+ and equal bilaterally. No erythema. Neuro: Alert and oriented X 3. No facial asymmetry. No focal deficit. Moves all extremities spontaneously. Psych:  Responds to questions appropriately with a normal affect.   Assessment and Plan:   1. Fever of unclear cause associated with fatigue and headache 2. Paroxysmal 2:1 atrial flutter, recognized in the ER today - CHADSVASC = 4 3. Recent admission for atrial fibrillation, discharged earlier today in NSR 4. Chest pain upon inspiration with recent flight to [place] and  h/o malignancy 5. IDDM, on insulin pump 6. Hypertension 7. Recent possible diastolic CHF in the setting of rapid AF 8. Recent elevated troponin of 0.82 in setting of severe hypotension on 3/27  She has received $RemoveBefo'20mg'YpQJEErKltv$  of IV diltiazem and is going in and out of atrial flutter RVR on telemetry. Since she has responded well to diltiazem in the past and is hemodynamically stable, will start diltiazem $RemoveBeforeD'60mg'wDesslStgadaqV$  q6hr. Continue Eliquis. Will need ischemic evaluation given recent abnormal troponins - anticipate nuclear stress test during admission when HR is better controlled. Per d/w Dr. Rayann Heman, will give one dose of flecainide $RemoveBefor'100mg'iEkOrEPUmnDX$  in ER to help assist with rhythm maintenance. Will check d-dimer given reports of chest pain and flight.  Recommend IM admission for workup of fever - note recent admission 3/26 for severe hypotension and bradycardia with possible UTI but source not totally clear.   Signed, Melina Copa PA-C 01/12/2015, 4:54 PM   I have seen, examined the patient, and reviewed the above assessment and plan.  On exam, comfortably with tachycardic irregular rhythm.  Changes to above are made where necessary.    She presents with atrial flutter for which she is minimally symptomatic.  I am more worried about her fever.  From a CV standpoint, will give flecainide x 1 and initiate oral cardizem.  No need to use IV cardizem unless she becomes unstable or more symptomatic.  Continue eliquis. D-dimer.  Myoview once atrial arrhythmias are stable.  Would admit to medicine  for evaluation of fever.  Cardiology team to follow while here.    Co Sign: Thompson Grayer, MD 01/12/2015 5:39 PM

## 2015-01-12 NOTE — Discharge Summary (Signed)
Patient ID: Gina [name],  MRN: [id number], DOB/AGE: 1946-04-25 69 y.o.  Admit date: 01/11/2015 Discharge date: 01/12/2015  Primary Care Provider: Odette Fraction, MD Primary Cardiologist: Dr Debara Pickett (new)  Discharge Diagnoses Principal Problem:   Atrial fibrillation with RVR Active Problems:   Essential hypertension   Syncope and collapse   Diabetes type 2, controlled   Elevated troponin   Breast cancer-hx of radiation Rx   Anemia   Hypothyroidism   Chronic anticoagulation-Eliquis    Hospital Course:  68 yr old female with DM, HTN, hyperlipidemia, hypothyroidism, and history of left breast cancer status post lumpectomy and subsequent radiation therapy. She was admitted 01/08/15 with "sepsis" , treated with fluids and antibiotic therapy, (blood and urine culture negative). She was noted to be hypotensive and bradycardic then. She was discharged 01/10/15.An echo Doppler study revealed normal systolic function with mild pulmonary hypertension.She was at her PCP's office 01/11/15 when she had a episode of dizziness and was found to be in atrial fibrillation with a rapid ventricular response. She was admitted to [facility] for further evaluation. She has since converted to NSR. Her ARB changed to Lopressor and Eliquis added. She has had an elevated Troponin felt to be secondary to demand ischemia. Her CXR suggested vascular congestion and she received one dose of po Lasix. Before admission she was on several OTC preparations and we have asked to hold off on these for now. She'll f/u with Dr Debara Pickett or an APP in one to two weeks as an OP.   Discharge Vitals:  Blood pressure 116/51, pulse 67, temperature 98.4 F (36.9 C), temperature source Oral, resp. rate 18, height 5\' 4"  (1.626 m), weight 157 lb 8 oz (71.442 kg), SpO2 97 %.    Labs: Results for orders placed or performed during the hospital encounter of 01/11/15 (from the past 24 hour(s))  CBC     Status: Abnormal   Collection Time:  01/11/15  1:20 PM  Result Value Ref Range   WBC 9.3 4.0 - 10.5 K/uL   RBC 3.55 (L) 3.87 - 5.11 MIL/uL   Hemoglobin 10.7 (L) 12.0 - 15.0 g/dL   HCT 32.6 (L) 36.0 - 46.0 %   MCV 91.8 78.0 - 100.0 fL   MCH 30.1 26.0 - 34.0 pg   MCHC 32.8 30.0 - 36.0 g/dL   RDW 13.7 11.5 - 15.5 %   Platelets 110 (L) 150 - 400 K/uL  Basic metabolic panel     Status: Abnormal   Collection Time: 01/11/15  1:20 PM  Result Value Ref Range   Sodium 132 (L) 135 - 145 mmol/L   Potassium 4.0 3.5 - 5.1 mmol/L   Chloride 100 96 - 112 mmol/L   CO2 24 19 - 32 mmol/L   Glucose, Bld 181 (H) 70 - 99 mg/dL   BUN 10 6 - 23 mg/dL   Creatinine, Ser 0.91 0.50 - 1.10 mg/dL   Calcium 8.4 8.4 - 10.5 mg/dL   GFR calc non Af Amer 62 (L) >90 mL/min   GFR calc Af Amer 72 (L) >90 mL/min   Anion gap 8 5 - 15  TSH     Status: None   Collection Time: 01/11/15  1:20 PM  Result Value Ref Range   TSH 1.449 0.350 - 4.500 uIU/mL  I-stat troponin, ED (not at [facility])     Status: Abnormal   Collection Time: 01/11/15  1:26 PM  Result Value Ref Range   Troponin i, poc 0.11 (HH) 0.00 -  0.08 ng/mL   Comment NOTIFIED PHYSICIAN    Comment 3          I-Stat CG4 Lactic Acid, ED     Status: Abnormal   Collection Time: 01/11/15  1:28 PM  Result Value Ref Range   Lactic Acid, Venous 2.72 (HH) 0.5 - 2.0 mmol/L   Comment NOTIFIED PHYSICIAN   Urinalysis, Routine w reflex microscopic     Status: Abnormal   Collection Time: 01/11/15  2:35 PM  Result Value Ref Range   Color, Urine YELLOW YELLOW   APPearance CLEAR CLEAR   Specific Gravity, Urine 1.011 1.005 - 1.030   pH 6.0 5.0 - 8.0   Glucose, UA 100 (A) NEGATIVE mg/dL   Hgb urine dipstick NEGATIVE NEGATIVE   Bilirubin Urine NEGATIVE NEGATIVE   Ketones, ur 40 (A) NEGATIVE mg/dL   Protein, ur NEGATIVE NEGATIVE mg/dL   Urobilinogen, UA 0.2 0.0 - 1.0 mg/dL   Nitrite NEGATIVE NEGATIVE   Leukocytes, UA NEGATIVE NEGATIVE  Protime-INR     Status: None   Collection Time: 01/11/15  3:10 PM  Result  Value Ref Range   Prothrombin Time 13.1 11.6 - 15.2 seconds   INR 0.98 0.00 - 1.49  APTT     Status: Abnormal   Collection Time: 01/11/15  3:10 PM  Result Value Ref Range   aPTT 20 (L) 24 - 37 seconds  CBG monitoring, ED     Status: Abnormal   Collection Time: 01/11/15  3:10 PM  Result Value Ref Range   Glucose-Capillary 167 (H) 70 - 99 mg/dL  I-Stat CG4 Lactic Acid, ED     Status: None   Collection Time: 01/11/15  5:20 PM  Result Value Ref Range   Lactic Acid, Venous 1.35 0.5 - 2.0 mmol/L  Troponin I-(serum)     Status: Abnormal   Collection Time: 01/11/15  7:58 PM  Result Value Ref Range   Troponin I 0.22 (H) <0.031 ng/mL  Glucose, capillary     Status: Abnormal   Collection Time: 01/11/15  9:57 PM  Result Value Ref Range   Glucose-Capillary 123 (H) 70 - 99 mg/dL  Troponin I-(serum)     Status: Abnormal   Collection Time: 01/12/15  1:17 AM  Result Value Ref Range   Troponin I 0.19 (H) <0.031 ng/mL  Glucose, capillary     Status: Abnormal   Collection Time: 01/12/15  1:59 AM  Result Value Ref Range   Glucose-Capillary 122 (H) 70 - 99 mg/dL  Lipid panel     Status: Abnormal   Collection Time: 01/12/15  2:53 AM  Result Value Ref Range   Cholesterol 165 0 - 200 mg/dL   Triglycerides 56 <150 mg/dL   HDL 54 >39 mg/dL   Total CHOL/HDL Ratio 3.1 RATIO   VLDL 11 0 - 40 mg/dL   LDL Cholesterol 100 (H) 0 - 99 mg/dL  CBC     Status: Abnormal   Collection Time: 01/12/15  3:00 AM  Result Value Ref Range   WBC 4.0 4.0 - 10.5 K/uL   RBC 3.17 (L) 3.87 - 5.11 MIL/uL   Hemoglobin 9.5 (L) 12.0 - 15.0 g/dL   HCT 28.4 (L) 36.0 - 46.0 %   MCV 89.6 78.0 - 100.0 fL   MCH 30.0 26.0 - 34.0 pg   MCHC 33.5 30.0 - 36.0 g/dL   RDW 13.6 11.5 - 15.5 %   Platelets 113 (L) 150 - 400 K/uL  Basic metabolic panel     Status:  Abnormal   Collection Time: 01/12/15  3:00 AM  Result Value Ref Range   Sodium 136 135 - 145 mmol/L   Potassium 3.5 3.5 - 5.1 mmol/L   Chloride 103 96 - 112 mmol/L   CO2 26  19 - 32 mmol/L   Glucose, Bld 117 (H) 70 - 99 mg/dL   BUN 7 6 - 23 mg/dL   Creatinine, Ser 0.66 0.50 - 1.10 mg/dL   Calcium 8.3 (L) 8.4 - 10.5 mg/dL   GFR calc non Af Amer 87 (L) >90 mL/min   GFR calc Af Amer >90 >90 mL/min   Anion gap 7 5 - 15  Glucose, capillary     Status: Abnormal   Collection Time: 01/12/15  6:11 AM  Result Value Ref Range   Glucose-Capillary 122 (H) 70 - 99 mg/dL  Troponin I-(serum)     Status: Abnormal   Collection Time: 01/12/15  7:17 AM  Result Value Ref Range   Troponin I 0.14 (H) <0.031 ng/mL  Glucose, capillary     Status: None   Collection Time: 01/12/15  7:23 AM  Result Value Ref Range   Glucose-Capillary 99 70 - 99 mg/dL    Disposition:  Follow-up Information    Follow up with Pixie Casino, MD.   Specialty:  Cardiology   Why:  office will call you   Contact information:   [facility] [place] [id number] [phone]       Discharge Medications:    Medication List    STOP taking these medications        Betaine HCl 300 MG Tabs     irbesartan 150 MG tablet  Commonly known as:  AVAPRO     OVER THE COUNTER MEDICATION     Resveratrol 250 MG Caps      TAKE these medications        acetaminophen 325 MG tablet  Commonly known as:  TYLENOL  Take 2 tablets (650 mg total) by mouth every 4 (four) hours as needed for headache or mild pain.     acidophilus Caps capsule  Take 1 capsule by mouth daily.     apixaban 5 MG Tabs tablet  Commonly known as:  ELIQUIS  Take 1 tablet (5 mg total) by mouth 2 (two) times daily.     aspirin 81 MG tablet  Take 81 mg by mouth daily.     co-enzyme Q-10 50 MG capsule  Take 200 mg by mouth daily.     Cod Liver Oil 1000 MG Caps  Take by mouth. 4 tablets daily     cyanocobalamin 1000 MCG tablet  Take 1,000 mcg by mouth daily.     glucose 4 GM chewable tablet  Chew 16 g by mouth as needed.     insulin lispro 100 UNIT/ML injection  Commonly known as:  HUMALOG  Basal rate  0.9 7am-12, 0.8 Noon-7am. Max of 60 units SQ every day with pump mer MD DX CODE: E10.65     MAGNESIUM GLYCINATE PLUS PO  Take 400 mg by mouth daily.     metoprolol tartrate 25 MG tablet  Commonly known as:  LOPRESSOR  Take 0.5 tablets (12.5 mg total) by mouth 2 (two) times daily.     MSM PO  Take 1,000 each by mouth daily.     NALTREXONE HCL PO  Take 4.5 mg by mouth.     naproxen sodium 220 MG tablet  Commonly known as:  ANAPROX  Take 220 mg  by mouth 2 (two) times daily as needed (pain).     PROGESTERONE EX  Apply 1 application topically daily. Apply 4 clicks to chest daily.     Red Yeast Rice 600 MG Tabs  Take 1 tablet by mouth 4 (four) times daily.     thyroid 30 MG tablet  Commonly known as:  ARMOUR  Take 30 mg by mouth daily before breakfast. Takes 1 tablet every other day and 2 tablets alternating days     vitamin C 1000 MG tablet  Take 8,000 mg by mouth daily.     Vitamin D-3 5000 UNITS Tabs  Take 5,000 Units by mouth daily.         Duration of Discharge Encounter: Greater than 30 minutes including physician time.  Angelena Form PA-C 01/12/2015 9:38 AM  Patient seen and examined. I agree with the assessment and plan as detailed above. See also my additional thoughts below.   See progress note. I made decision for pt to be discharged.  Dola Argyle, MD, [facility] 01/12/2015 11:02 AM

## 2015-01-12 NOTE — Progress Notes (Signed)
Subjective:  No complaints this am. Holding NSR since yesterday PM.   Objective:  Vital Signs in the last 24 hours: Temp:  [98 F (36.7 C)-98.6 F (37 C)] 98.4 F (36.9 C) (03/30 0545) Pulse Rate:  [64-146] 67 (03/30 0545) Resp:  [15-22] 18 (03/30 0545) BP: (88-144)/(42-96) 116/51 mmHg (03/30 0545) SpO2:  [94 %-100 %] 97 % (03/30 0545) Weight:  [157 lb 8 oz (71.442 kg)] 157 lb 8 oz (71.442 kg) (03/29 1938)  Intake/Output from previous day:  Intake/Output Summary (Last 24 hours) at 01/12/15 0834 Last data filed at 01/11/15 2300  Gross per 24 hour  Intake    240 ml  Output      0 ml  Net    240 ml    Physical Exam: General appearance: alert, cooperative and no distress Neck: no carotid bruit and no JVD Lungs: clear to auscultation bilaterally Heart: regular rate and rhythm Extremities: no edema   Rate: 70  Rhythm: normal sinus rhythm  Lab Results:  Recent Labs  01/11/15 1320 01/12/15 0300  WBC 9.3 4.0  HGB 10.7* 9.5*  PLT 110* 113*    Recent Labs  01/11/15 1320 01/12/15 0300  NA 132* 136  K 4.0 3.5  CL 100 103  CO2 24 26  GLUCOSE 181* 117*  BUN 10 7  CREATININE 0.91 0.66    Recent Labs  01/11/15 1958 01/12/15 0117  TROPONINI 0.22* 0.19*    Recent Labs  01/11/15 1510  INR 0.98    Imaging: Imaging results have been reviewed   Assessment/Plan:  69 yr old female with DM, HTN, hyperlipidemia, hypothyroidism, and history of left breast cancer status post lumpectomy and subsequent radiation therapy. She was  admitted 01/08/15 with "sepsis" , treated with fluids and antibiotic therapy, (blood and urine culture negative). She was discharged 01/10/15.An echo Doppler study revealed normal systolic function with mild pulmonary hypertension.She was at her PCP's office when she had a episode of dizziness and was found to be in atrial fibrillation with a rapid ventricular response. She has since converted to NSR. ARB changed to Lopressor and Eliquis  added. She has had an elevated Troponin felt to be secondary to demand ischemia.   Principal Problem:   Atrial fibrillation with RVR Active Problems:   Essential hypertension   Syncope and collapse   Diabetes type 2, controlled   Elevated troponin   Breast cancer-hx of radiation Rx   Anemia   Hypothyroidism   Chronic anticoagulation-Eliquis   PLAN: OP Myoview at some point. If B/P remains stable consider adding back ARB as an OP for DM. I will arrange f/u with Dr Debara Pickett as an OP. Give 20 meq KCL this am, get EKG in NSR.   Kerin Ransom PA-C Beeper [phone] 01/12/2015, 8:34 AM Patient seen and examined. I agree with the assessment and plan as detailed above. See also my additional thoughts below.   I have reviewed all of the information carefully. I spoken at length with the patient and her husband. She has requested copy of records. She wants to be sure that she has records when she sees different physicians. She has me about the trace edema in her ankles. This is present during the day frequently. Usually it is gone at nighttime. On physical exam there is no evidence of volume overload. I talked to her about being careful with her salt intake and using fluids in moderation. She will update our team when she seen in the office for early  follow-up about her ankle edema to see if any further changes are to be made. She is stable for discharge.  Dola Argyle, MD, [facility] 01/12/2015 9:36 AM

## 2015-01-12 NOTE — Progress Notes (Signed)
Pt discharged home with spouse.  Reviewed discharge instructions and education, all questions answered.  Assessment unchanged from earlier. 

## 2015-01-12 NOTE — ED Notes (Addendum)
Pt here sts she feels like her heart is racing. sts started about 3pm. Pt just discharged this am. Pt given 1/2 metoprolol at 3:30

## 2015-01-12 NOTE — Progress Notes (Signed)

## 2015-01-12 NOTE — Progress Notes (Signed)
ANTIBIOTIC CONSULT NOTE - INITIAL  Pharmacy Consult for Vancomycin Indication: rule out sepsis  Allergies  Allergen Reactions  . Statins Other (See Comments)    Leg pain    Patient Measurements: Height: 5\' 4"  (162.6 cm) Weight: 153 lb (69.4 kg) IBW/kg (Calculated) : 54.7  Vital Signs: Temp: 99 F (37.2 C) (03/30 1952) Temp Source: Oral (03/30 1952) BP: 120/54 mmHg (03/30 1952) Pulse Rate: 133 (03/30 1915) Intake/Output from previous day:   Intake/Output from this shift: Total I/O In: -  Out: 700 [Urine:700]  Labs:  Recent Labs  01/11/15 1320 01/12/15 0300 01/12/15 1611 01/12/15 1644  WBC 9.3 4.0 8.5  --   HGB 10.7* 9.5* 11.5* 13.3  PLT 110* 113* 140*  --   CREATININE 0.91 0.66 0.73 0.50   Estimated Creatinine Clearance: 62.6 mL/min (by C-G formula based on Cr of 0.5). No results for input(s): VANCOTROUGH, VANCOPEAK, VANCORANDOM, GENTTROUGH, GENTPEAK, GENTRANDOM, TOBRATROUGH, TOBRAPEAK, TOBRARND, AMIKACINPEAK, AMIKACINTROU, AMIKACIN in the last 72 hours.   Microbiology: Recent Results (from the past 720 hour(s))  Blood Culture (routine x 2)     Status: None (Preliminary result)   Collection Time: 01/08/15  7:57 AM  Result Value Ref Range Status   Specimen Description BLOOD RIGHT HAND  Final   Special Requests BOTTLES DRAWN AEROBIC AND ANAEROBIC B 4CC R 3CC  Final   Culture   Final           BLOOD CULTURE RECEIVED NO GROWTH TO DATE CULTURE WILL BE HELD FOR 5 DAYS BEFORE ISSUING A FINAL NEGATIVE REPORT Performed at [facility]    Report Status PENDING  Incomplete  Blood Culture (routine x 2)     Status: None (Preliminary result)   Collection Time: 01/08/15  8:21 AM  Result Value Ref Range Status   Specimen Description BLOOD RIGHT ARM  Final   Special Requests BOTTLES DRAWN AEROBIC ONLY 5CC  Final   Culture   Final           BLOOD CULTURE RECEIVED NO GROWTH TO DATE CULTURE WILL BE HELD FOR 5 DAYS BEFORE ISSUING A FINAL NEGATIVE REPORT Performed at  [facility]    Report Status PENDING  Incomplete  Urine culture     Status: None   Collection Time: 01/08/15  9:15 AM  Result Value Ref Range Status   Specimen Description URINE, CLEAN CATCH  Final   Special Requests NONE  Final   Colony Count NO GROWTH Performed at [facility]   Final   Culture NO GROWTH Performed at [facility]   Final   Report Status 01/09/2015 FINAL  Final  MRSA PCR Screening     Status: None   Collection Time: 01/08/15  4:22 PM  Result Value Ref Range Status   MRSA by PCR NEGATIVE NEGATIVE Final    Comment:        The GeneXpert MRSA Assay (FDA approved for NASAL specimens only), is one component of a comprehensive MRSA colonization surveillance program. It is not intended to diagnose MRSA infection nor to guide or monitor treatment for MRSA infections.   Blood culture (routine x 2)     Status: None (Preliminary result)   Collection Time: 01/12/15  6:25 PM  Result Value Ref Range Status   Specimen Description BLOOD RIGHT HAND  Final   Special Requests BOTTLES DRAWN AEROBIC ONLY 4CC  Final   Culture PENDING  Incomplete   Report Status PENDING  Incomplete    Medical History:  Past Medical History  Diagnosis Date  . Ovarian cyst   . Hypertension   . Insulin pump in place   . 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 Anxiety     new dx  . Hyperlipidemia   . S/P radiation therapy 04/10/12- 05/26/2012    left Breast and Axilla / 50 gy / 25 Fractions with Left Breast Boost / 10 Gy / 5 Fractions  . Use of letrozole (Femara) 06/13/12  . Diabetes mellitus type 1     Insulin pump  . Hypothyroidism   . Diastolic CHF     a. 10/9415 - due to AF RVR.  Marland Kitchen Atrial fibrillation     Medications:  Prescriptions prior to admission  Medication Sig Dispense Refill Last Dose  . acetaminophen (TYLENOL) 325 MG tablet Take 2  tablets (650 mg total) by mouth every 4 (four) hours as needed for headache or mild pain.   unknown  . apixaban (ELIQUIS) 5 MG TABS tablet Take 1 tablet (5 mg total) by mouth 2 (two) times daily. 60 tablet 11 not yet taken at home  . aspirin EC 81 MG tablet Take 81 mg by mouth daily.   not yet taken at home  . glucose 4 GM chewable tablet Chew 16 g by mouth as needed for low blood sugar.    unknown  . insulin lispro (HUMALOG) 100 UNIT/ML injection Basal rate 0.9 7am-12, 0.8 Noon-7am. Max of 60 units SQ every day with pump mer MD DX CODE: E10.65 (Patient taking differently: Insulin pump - Basal rate from 12 am-4:59 am at 0.45 units/hr, 5 am-6:59 am at 0.9 units/hr, 7 am-9:59 am at 1 unit/hr, 10 am-11:59 am at 1.1 units/hr, 12 pm-3:59 pm at 0.9 units/hr, 4 pm-8:59 at 0.8 units/hr, 9 pm-11:59 pm at 0.75 units/hr. Max of 100 units SQ every day; max of 50 units SQ in a 2 hr time frame with pump mer MD DX CODE: E10.65) 20 mL 6 01/11/2015  . metoprolol tartrate (LOPRESSOR) 25 MG tablet Take 0.5 tablets (12.5 mg total) by mouth 2 (two) times daily. 30 tablet 11 01/12/2015 at 1530  . Misc Natural Products (PROGESTERONE EX) Apply 1 application topically daily. Apply 4 clicks to chest daily. - compounded at [facility]   01/10/2015  . NALTREXONE HCL PO Take 4.5 mg by mouth daily.    01/10/2015  . naproxen sodium (ANAPROX) 220 MG tablet Take 220 mg by mouth 2 (two) times daily as needed (pain). Aleve   unknown  . thyroid (ARMOUR) 30 MG tablet Take 30-60 mg by mouth daily before breakfast. Take 1 tablet (30 mg) on day 1, then take 2 tablets (60 mg) on day 2, then repeat   01/10/2015  . acidophilus (RISAQUAD) CAPS capsule Take 1 capsule by mouth daily. 10 capsule 0 01/10/2015  . Ascorbic Acid (VITAMIN C) 1000 MG tablet Take 1,000-2,000 mg by mouth 2 (two) times daily. Take 1 tablet (1000 mg) twice daily on day 1, then 1 tablet (1000 mg) every morning and 2 tablets (2000 mg) at dinner on day 2, then repeat    01/10/2015  . Cholecalciferol (VITAMIN D-3) 5000 UNITS TABS Take 5,000 Units by mouth daily.    01/10/2015  . Cod Liver Oil 1000 MG CAPS Take 2,000 mg by mouth 2 (two) times daily.    01/10/2015  . Coenzyme Q10 (COQ10) 200 MG CAPS Take 200 mg  by mouth daily.   01/10/2015  . cyanocobalamin 1000 MCG tablet Take 1,000 mcg by mouth daily.    01/10/2015  . magnesium oxide (MAG-OX) 400 MG tablet Take 400 mg by mouth daily.   01/10/2015  . Misc Natural Products (GLUCOSAMINE CHONDROITIN MSM PO) Take 1 tablet by mouth daily.   01/10/2015  . Red Yeast Rice 600 MG TABS Take 1,200 mg by mouth 2 (two) times daily.    01/10/2015   Assessment: 69 y.o. female presents with palpitations and fever. Pt just discharged home today (received Zosyn/Vanc 3/26->3/27 during last admission). To begin Rocephin and Vancomycin for r/o sepsis (unknown origin) -? LLL infiltrate. Tm 102.2. WBC wnl. SCr 0.5, est CrCl 60 ml/min. Cultures pending.  Goal of Therapy:  Vancomycin trough level 15-20 mcg/ml  Plan:  Vancomycin 750mg  IV q12h Will f/u renal function, micro data, and pt's clinical condition  Sherlon Handing, PharmD, BCPS Clinical pharmacist, pager [phone] 01/12/2015,8:58 PM

## 2015-01-13 ENCOUNTER — Encounter (HOSPITAL_COMMUNITY): Payer: Self-pay

## 2015-01-13 ENCOUNTER — Inpatient Hospital Stay (HOSPITAL_COMMUNITY): Payer: Medicare Other

## 2015-01-13 ENCOUNTER — Other Ambulatory Visit (HOSPITAL_COMMUNITY): Payer: Medicare Other

## 2015-01-13 DIAGNOSIS — I471 Supraventricular tachycardia: Secondary | ICD-10-CM

## 2015-01-13 DIAGNOSIS — I10 Essential (primary) hypertension: Secondary | ICD-10-CM

## 2015-01-13 DIAGNOSIS — R079 Chest pain, unspecified: Secondary | ICD-10-CM

## 2015-01-13 DIAGNOSIS — M7989 Other specified soft tissue disorders: Secondary | ICD-10-CM

## 2015-01-13 DIAGNOSIS — R791 Abnormal coagulation profile: Secondary | ICD-10-CM

## 2015-01-13 DIAGNOSIS — I4891 Unspecified atrial fibrillation: Secondary | ICD-10-CM

## 2015-01-13 LAB — CBC
HEMATOCRIT: 30.9 % — AB (ref 36.0–46.0)
Hemoglobin: 10.4 g/dL — ABNORMAL LOW (ref 12.0–15.0)
MCH: 30 pg (ref 26.0–34.0)
MCHC: 33.7 g/dL (ref 30.0–36.0)
MCV: 89 fL (ref 78.0–100.0)
Platelets: 133 10*3/uL — ABNORMAL LOW (ref 150–400)
RBC: 3.47 MIL/uL — ABNORMAL LOW (ref 3.87–5.11)
RDW: 13.7 % (ref 11.5–15.5)
WBC: 6.6 10*3/uL (ref 4.0–10.5)

## 2015-01-13 LAB — GLUCOSE, CAPILLARY
GLUCOSE-CAPILLARY: 119 mg/dL — AB (ref 70–99)
GLUCOSE-CAPILLARY: 124 mg/dL — AB (ref 70–99)
GLUCOSE-CAPILLARY: 133 mg/dL — AB (ref 70–99)
GLUCOSE-CAPILLARY: 184 mg/dL — AB (ref 70–99)
GLUCOSE-CAPILLARY: 249 mg/dL — AB (ref 70–99)
Glucose-Capillary: 141 mg/dL — ABNORMAL HIGH (ref 70–99)
Glucose-Capillary: 164 mg/dL — ABNORMAL HIGH (ref 70–99)
Glucose-Capillary: 124 mg/dL — ABNORMAL HIGH (ref 70–99)

## 2015-01-13 LAB — BASIC METABOLIC PANEL
Anion gap: 9 (ref 5–15)
BUN: 6 mg/dL (ref 6–23)
CO2: 24 mmol/L (ref 19–32)
Calcium: 8.1 mg/dL — ABNORMAL LOW (ref 8.4–10.5)
Chloride: 100 mmol/L (ref 96–112)
Creatinine, Ser: 0.67 mg/dL (ref 0.50–1.10)
GFR calc non Af Amer: 87 mL/min — ABNORMAL LOW (ref >90)
Glucose, Bld: 150 mg/dL — ABNORMAL HIGH (ref 70–99)
Potassium: 3.6 mmol/L (ref 3.5–5.1)
Sodium: 133 mmol/L — ABNORMAL LOW (ref 135–145)

## 2015-01-13 LAB — INFLUENZA PANEL BY PCR (TYPE A & B)
H1N1 flu by pcr: NOT DETECTED
Influenza A By PCR: NEGATIVE
Influenza B By PCR: NEGATIVE

## 2015-01-13 LAB — MAGNESIUM: MAGNESIUM: 1.7 mg/dL (ref 1.5–2.5)

## 2015-01-13 LAB — HEMOGLOBIN A1C
HEMOGLOBIN A1C: 7.1 % — AB (ref 4.8–5.6)
Mean Plasma Glucose: 157 mg/dL

## 2015-01-13 MED ORDER — TECHNETIUM TC 99M SESTAMIBI GENERIC - CARDIOLITE
10.0000 | Freq: Once | INTRAVENOUS | Status: AC | PRN
  Administered 2015-01-13: 10 via INTRAVENOUS

## 2015-01-13 MED ORDER — AZITHROMYCIN 500 MG PO TABS
500.0000 mg | ORAL_TABLET | Freq: Every day | ORAL | Status: DC
  Administered 2015-01-13 – 2015-01-14 (×2): 500 mg via ORAL
  Filled 2015-01-13 (×2): qty 1

## 2015-01-13 MED ORDER — REGADENOSON 0.4 MG/5ML IV SOLN
INTRAVENOUS | Status: AC
  Administered 2015-01-13: 0.4 mg via INTRAVENOUS
  Filled 2015-01-13: qty 5

## 2015-01-13 MED ORDER — DILTIAZEM HCL 60 MG PO TABS
60.0000 mg | ORAL_TABLET | Freq: Once | ORAL | Status: AC
  Administered 2015-01-13: 60 mg via ORAL
  Filled 2015-01-13: qty 1

## 2015-01-13 MED ORDER — FLECAINIDE ACETATE 50 MG PO TABS
50.0000 mg | ORAL_TABLET | Freq: Two times a day (BID) | ORAL | Status: DC
  Administered 2015-01-13 – 2015-01-14 (×2): 50 mg via ORAL
  Filled 2015-01-13 (×3): qty 1

## 2015-01-13 MED ORDER — TECHNETIUM TC 99M SESTAMIBI GENERIC - CARDIOLITE
30.0000 | Freq: Once | INTRAVENOUS | Status: AC | PRN
  Administered 2015-01-13: 30 via INTRAVENOUS

## 2015-01-13 MED ORDER — DILTIAZEM HCL 60 MG PO TABS
60.0000 mg | ORAL_TABLET | Freq: Four times a day (QID) | ORAL | Status: AC
  Administered 2015-01-13 (×2): 60 mg via ORAL
  Filled 2015-01-13 (×2): qty 1

## 2015-01-13 MED ORDER — REGADENOSON 0.4 MG/5ML IV SOLN
0.4000 mg | Freq: Once | INTRAVENOUS | Status: AC
  Administered 2015-01-13: 0.4 mg via INTRAVENOUS
  Filled 2015-01-13: qty 5

## 2015-01-13 MED ORDER — IOHEXOL 350 MG/ML SOLN
80.0000 mL | Freq: Once | INTRAVENOUS | Status: AC | PRN
  Administered 2015-01-13: 80 mL via INTRAVENOUS

## 2015-01-13 MED ORDER — DILTIAZEM HCL ER COATED BEADS 240 MG PO CP24
240.0000 mg | ORAL_CAPSULE | Freq: Every day | ORAL | Status: DC
  Administered 2015-01-14: 240 mg via ORAL
  Filled 2015-01-13 (×2): qty 1

## 2015-01-13 NOTE — Progress Notes (Signed)
Lexiscan stress test completed without complication. Pending final result by [facility]  Signed, Almyra Deforest PA Pager: [phone]

## 2015-01-13 NOTE — Progress Notes (Signed)
Patient has a One Touch Ping insulin pump with Humalog insulin. Reviewed Insulin pump settings with patient. The following are insulin pump settings: Total daily basal insulin: 19.10 units per day Basal rates:  12 am 0.45 units/hr 05 am 0.9 units/hr 07 am 1.0 units/hr 10 am 1.1 units/hr 12 pm 0.9 units/hr 04 pm 0.8 units/hr 09 pm 0.75 units/hr  Insulin to Carb Ratio: 1:8 (1 unit per 8 gms of carbs) Insulin Sensitivity Factor: 1:35 (1 unit drops glucose 35 mg/dl)  Blood glucose is trending fairly well at this time. Will continue to follow as an inpatient.  Thanks, Barnie Alderman, RN, MSN, CCRN, CDE Diabetes Coordinator Inpatient Diabetes Program [phone] (Team Pager from [place] to [place]) [phone] ([facility] office) [phone] [facility] office)

## 2015-01-13 NOTE — Progress Notes (Signed)
Pt current HR stable at 60s, Cardizem ordered not given at this time. We will continue to monitor.

## 2015-01-13 NOTE — Progress Notes (Signed)
Patient Name: Gina [name] Date of Encounter: 01/13/2015  Principal Problem:   Sepsis Active Problems:   Essential hypertension   Diabetes type 2, controlled   Atrial fibrillation with RVR   Hypothyroidism   Primary Cardiologist: Dr Debara Pickett  Patient Profile: 69 y/o F with recently diagnosed AF RVR (discharged 03/30) with subsequent diastolic CHF, HTN, HLD, DM type 1 on insulin pump, hypothyroidism, breast CA who was admitted back on 03/30 pm w/ atrial flutter, fever.  SUBJECTIVE: Very tired, needs rest, feels she might not get it. Not sure she can do stress test today.  OBJECTIVE Filed Vitals:   01/12/15 1952 01/12/15 2008 01/12/15 2138 01/13/15 0603  BP: 120/54  120/53 124/59  Pulse:   69 77  Temp: 99 F (37.2 C)  98.5 F (36.9 C) 99.2 F (37.3 C)  TempSrc: Oral  Oral Oral  Resp: 20  20 20   Height:  5\' 4"  (1.626 m)    Weight:  153 lb (69.4 kg)    SpO2: 94%  95% 95%    Intake/Output Summary (Last 24 hours) at 01/13/15 0915 Last data filed at 01/13/15 5093  Gross per 24 hour  Intake  972.5 ml  Output    700 ml  Net  272.5 ml   Filed Weights   01/12/15 2008  Weight: 153 lb (69.4 kg)    PHYSICAL EXAM General: Well developed, well nourished, female in no acute distress. Head: Normocephalic, atraumatic.  Neck: Supple without bruits, JVD not elevated. Lungs:  Resp regular and unlabored, CTA. Heart: RRR, S1, S2, no S3, S4, or murmur; no rub. Abdomen: Soft, non-tender, non-distended, BS + x 4.  Extremities: No clubbing, cyanosis, no edema.  Neuro: Alert and oriented X 3. Moves all extremities spontaneously. Psych: Normal affect.  LABS: CBC: Recent Labs  01/12/15 1611 01/12/15 1644 01/13/15 0618  WBC 8.5  --  6.6  HGB 11.5* 13.3 10.4*  HCT 34.2* 39.0 30.9*  MCV 89.5  --  89.0  PLT 140*  --  133*   INR: Recent Labs  01/11/15 1510  INR 2.67   Basic Metabolic Panel: Recent Labs  01/12/15 1611 01/12/15 1644 01/13/15 0618  NA 134* 136 133*    K 3.5 3.5 3.6  CL 97 96 100  CO2 21  --  24  GLUCOSE 84 80 150*  BUN 5* 7 6  CREATININE 0.73 0.50 0.67  CALCIUM 8.9  --  8.1*  MG  --   --  1.7   Cardiac Enzymes: Recent Labs  01/11/15 1958 01/12/15 0117 01/12/15 0717  TROPONINI 0.22* 0.19* 0.14*    Recent Labs  01/12/15 1643 01/12/15 1729  TROPIPOC <0.20* 0.04   BNP:  B NATRIURETIC PEPTIDE  Date/Time Value Ref Range Status  01/12/2015 04:11 PM 345.8* 0.0 - 100.0 pg/mL Final   D-dimer: Recent Labs  01/12/15 1650  DDIMER 1.66*   Hemoglobin A1C: Recent Labs  01/11/15 1958  HGBA1C 7.1*   Fasting Lipid Panel: Recent Labs  01/12/15 0253  CHOL 165  HDL 54  LDLCALC 100*  TRIG 56  CHOLHDL 3.1   Thyroid Function Tests: Recent Labs  01/11/15 1320  TSH 1.449   TELE:  In/out afib overnight, episodes lasting 1-2 hours    ECG: 03/31 SR, no acute changes  Radiology/Studies: Dg Chest 2 View 01/11/2015   CLINICAL DATA:  Hypotension and dehydration. History of breast cancer.  EXAM: CHEST  2 VIEW  COMPARISON:  01/09/2015.  FINDINGS: Worsening interstitial edema. Cardiomegaly.  Congestive heart failure not excluded. No areas of focal consolidation. Small BILATERAL effusions. LEFT mastectomy. No osseous findings.  IMPRESSION: Worsening aeration. BILATERAL interstitial prominence consistent with early congestive failure.   Electronically Signed   By: Rolla Flatten M.D.   On: 01/11/2015 14:17   Ct Angio Chest Pe W/cm &/or Wo Cm 01/13/2015   CLINICAL DATA:  Shortness of breath, mild chest pain. Elevated D-dimer. History of breast cancer 3 years prior.  EXAM: CT ANGIOGRAPHY CHEST WITH CONTRAST  TECHNIQUE: Multidetector CT imaging of the chest was performed using the standard protocol during bolus administration of intravenous contrast. Multiplanar CT image reconstructions and MIPs were obtained to evaluate the vascular anatomy.  CONTRAST:  57mL OMNIPAQUE IOHEXOL 350 MG/ML SOLN  COMPARISON:  Chest radiographs from 01/08/2015  through 01/11/2015  FINDINGS: There are no filling defects within the pulmonary arteries to suggest pulmonary embolus.  The thoracic aorta is normal in caliber with mild atherosclerosis. There is mild diffuse shotty mediastinal and to a lesser extent hilar adenopathy, for example, lower paratracheal lymph node measures 8 mm in short axis dimension. Prevascular lymph node measures 5 mm. There is mild cardiomegaly and contrast refluxing into the hepatic veins and IVC.  Moderate bilateral pleural effusions with adjacent compressive atelectasis. No pericardial effusion. Smooth septal thickening and mild ground-glass opacity, in an upper lobe predominant distribution consistent with pulmonary edema. Linear reticular opacity in the anterior left upper lobe in a pattern consistent with post radiation change. Patient is post left lumpectomy. No discrete pulmonary nodule.  Hepatic granuloma noted. There is otherwise no acute abnormality in the included upper abdomen. Surgical clips noted in the inferior right breast.  There are no acute or suspicious osseous abnormalities. No discrete lytic or blastic osseous lesion.  Review of the MIP images confirms the above findings.  IMPRESSION: 1. No pulmonary embolus. 2. Findings consistent with congestive heart failure. 3. Shotty mediastinal and bilateral hilar adenopathy. This is likely reactive related to congestive heart failure. 4. Post left breast lumpectomy with post radiation change in the left upper lobe.   Electronically Signed   By: Jeb Levering M.D.   On: 01/13/2015 02:10     Current Medications:  . apixaban  5 mg Oral BID  . aspirin EC  81 mg Oral Daily  . cefTRIAXone (ROCEPHIN)  IV  1 g Intravenous Q24H  . diltiazem  60 mg Oral 4 times per day  . diltiazem  60 mg Oral Once  . docusate sodium  100 mg Oral BID  . insulin pump   Subcutaneous TID AC, HS, 0200  . metoprolol tartrate  12.5 mg Oral BID  . sodium chloride  3 mL Intravenous Q12H  . thyroid  30  mg Oral Q48H   And  . [START ON 01/14/2015] thyroid  60 mg Oral Q48H  . vancomycin  750 mg Intravenous Q12H   . sodium chloride 75 mL/hr at 01/12/15 2131    ASSESSMENT AND PLAN: Principal Problem:   Sepsis - now on droplet precautions for possible flu - per IM  Active Problems:   Essential hypertension - per IM, currently well-controlled    Diabetes type 2, controlled - per IM, on insulin pump    Atrial fibrillation with RVR - Cardizem was given, 4 doses so far - will change to CD at 240 mg daily - was on apixaban PTA, continued - on home dose of metoprolol 12.5 mg bid, no doses held - will add Flecainide 50 mg bid, will d/c  if MV positive    Hypothyroidism - per IM - TSH OK    Elevated troponin, Chest pain - resolved once HR improved, peak 0.22 - no previous hx CAD, but multiple CRFs - Echo 03/28 w/ nl EF, no WMA - will get Lexiscan MV today (get resting images this pm, get stress images if they look ok) if pt feels well enough - if MV is OK, dx will be demand ischemia  Signed, Rosaria Ferries , PA-C 9:15 AM 01/13/2015   I have seen, examined the patient, and reviewed the above assessment and plan.  On exam, RRR.  Changes to above are made where necessary.  She is maintaining sinus rhythm for the most part.  Will convert to once daily cardizem and add flecainide 50mg  BID.  If myoview is low risk/ normal, no further CV testing is planned.  If myoview is significantly abnormal, will need further investigation.  Would follow-up in the AF clinic early next week.  Co Sign: Thompson Grayer, MD 01/13/2015 5:01 PM

## 2015-01-13 NOTE — Progress Notes (Signed)
Pt D-dimer is elevated, Schorr,NP made aware and ordered stat CT Angio, result negative for PE. Pt HR 120s-150s. Cardio on call made aware and ordered cardizem 60mg  x1. Orders carried out. We will continue to monitor.

## 2015-01-13 NOTE — Progress Notes (Signed)
*  PRELIMINARY RESULTS* Vascular Ultrasound Lower extremity venous duplex has been completed.  Preliminary findings: negative for DVT.   Landry Mellow, RDMS, RVT  01/13/2015, 10:36 AM

## 2015-01-13 NOTE — Progress Notes (Signed)
Utilization review completed. Konner Warrior, RN, BSN. 

## 2015-01-13 NOTE — Progress Notes (Signed)
[facility] PROGRESS NOTE  Gina [name] QPY:[id number] DOB: 12-Jul-1946 DOA: 01/12/2015 PCP: Odette Fraction, MD  Assessment/Plan: Principal Problem:   Sepsis Active Problems:   Essential hypertension   Diabetes type 2, controlled   Atrial fibrillation with RVR   Hypothyroidism   Atrial fibrillation with rapid ventricular response Currently normal sinus rhythm Continue Cardizem, flecainide, Eliquis Metoprolol resumed by cardiology TSH within normal limits  Chest pain Elevated troponin, peak 0.22 Patient had a 2-D echo on 3/20 and that was within normal limits without any wall motion abnormalities Cardiology to perform a stress test today   Fever Will repeat influenza PCR and respiratory panel Previous workup on 3/26 admission including blood culture, urine culture, influenza screen was negative CTA does not show any obvious pneumonia  Hypothyroidism Continue armour  Diabetes Patient on an insulin pump We will consult diabetes coordinator  Abnormal d-dimer CT A- negative Venous Doppler negative  History of breast cancer status post left breast lumpectomy Mediastinal and bilateral hilar adenopathy likely reactive secondary to CHF   Code Status: full Family Communication: family updated about patient's clinical progress Disposition Plan:   anticipated discharge in one to 2 days   Brief narrative: Gina [name] is a 69 y/o F with recently diagnosed AF RVR (discharged this AM) with subsequent diastolic CHF, HTN, HLD, DM type 1 on insulin pump, hypothyroidism, breast CA who presents back to [facility] with palpitations. She was recently admitted 01/08/15 with "severe sepsis" with hypotension into the 60s of unclear cause, HR 30s, treated with IV fluids and antibiotic therapy (blood and urine culture negative). She was discharged 01/10/15.2D Echo 3/28: EF 55-60%, no RWMA, trace MR and mild TR, PASP 67mmHg. She was at her PCP's office for followup 01/11/15  when she had a episode of dizziness and was found to be in atrial fibrillation with a rapid ventricular response. She was admitted to [facility] for further evaluation. She spontaneously converted to NSR on IV diltiazem and started started on low dose BB. Her ARB changed to Lopressor and Eliquis added. She has had an elevated troponin felt to be secondary to demand ischemia, possible plan for outpatient nuclear stress test. Her CXR suggested vascular congestion and she received one dose of po Lasix. She was discharged this AM in NSR. She has had 2 doses of Eliquis so far.  She went home and was feeling OK but this afternoon developed recurrence of tachypalpitations. She also reports feeling fatigued and has a headache. Temp 102.6, confirmed with recheck. She feels cold but denies overt rigors, malaise or nasal symptoms. On telemetry she has gone in and out of atrial flutter. Labs also notable for Hgb 11.5, Plt 140 (although Hgb earlier this AM was ?9.5 with platelets 113), Istat chem8 pending. BMET earlier this AM showed Na 136, K 3.5, BUN 7, Cr 0.66. TSH normal on 3/27. Note Hgb 12.8 in 08/2014, was 10.4 on last admission 01/08/15. Denies any unusual bleeding. She does endorse some chest discomfort upon inspiration. Had 9 hour flight to [place] on February 23. No recent LEE, LE erythema, pain, dyspnea or recent sick contacts.  Consultants:  Cardiology  Procedures:  Venous Doppler which was negative  Antibiotics: Rocephin/azithromycin  HPI/Subjective: Afebrile overnight, still has pleuritic chest pain when she takes in a deep breath  Objective: Filed Vitals:   01/12/15 2008 01/12/15 2138 01/13/15 0603 01/13/15 0945  BP:  120/53 124/59 114/46  Pulse:  69 77 70  Temp:  98.5 F (36.9 C) 99.2 F (37.3 C)  TempSrc:  Oral Oral   Resp:  20 20   Height: 5\' 4"  (1.626 m)     Weight: 69.4 kg (153 lb)     SpO2:  95% 95%     Intake/Output Summary (Last 24 hours) at 01/13/15 1045 Last data filed at  01/13/15 1002  Gross per 24 hour  Intake 1212.5 ml  Output    900 ml  Net  312.5 ml    Exam:  General: No acute respiratory distress Lungs: Clear to auscultation bilaterally without wheezes or crackles Cardiovascular: Regular rate and rhythm without murmur gallop or rub normal S1 and S2 Abdomen: Nontender, nondistended, soft, bowel sounds positive, no rebound, no ascites, no appreciable mass Extremities: No significant cyanosis, clubbing, or edema bilateral lower extremities      Data Reviewed: Basic Metabolic Panel:  Recent Labs Lab 01/09/15 0305  01/11/15 1320 01/12/15 0300 01/12/15 1611 01/12/15 1644 01/13/15 0618  NA 136  --  132* 136 134* 136 133*  K 3.4*  --  4.0 3.5 3.5 3.5 3.6  CL 107  --  100 103 97 96 100  CO2 22  --  24 26 21   --  24  GLUCOSE 100*  < > 181* 117* 84 80 150*  BUN 15  --  10 7 5* 7 6  CREATININE 0.82  --  0.91 0.66 0.73 0.50 0.67  CALCIUM 7.6*  --  8.4 8.3* 8.9  --  8.1*  MG  --   --   --   --   --   --  1.7  < > = values in this interval not displayed.  Liver Function Tests:  Recent Labs Lab 01/08/15 0641 01/09/15 0305  AST 65* 51*  ALT 41* 42*  ALKPHOS 71 55  BILITOT 0.6 0.9  PROT 5.2* 4.9*  ALBUMIN 3.0* 2.6*   No results for input(s): LIPASE, AMYLASE in the last 168 hours. No results for input(s): AMMONIA in the last 168 hours.  CBC:  Recent Labs Lab 01/08/15 0641 01/09/15 0305 01/11/15 1320 01/12/15 0300 01/12/15 1611 01/12/15 1644 01/13/15 0618  WBC 13.0* 7.9 9.3 4.0 8.5  --  6.6  NEUTROABS 11.6*  --   --   --   --   --   --   HGB 10.4* 9.7* 10.7* 9.5* 11.5* 13.3 10.4*  HCT 32.2* 29.1* 32.6* 28.4* 34.2* 39.0 30.9*  MCV 91.2 90.4 91.8 89.6 89.5  --  89.0  PLT 124* 145* 110* 113* 140*  --  133*    Cardiac Enzymes:  Recent Labs Lab 01/09/15 1602 01/09/15 2100 01/11/15 1958 01/12/15 0117 01/12/15 0717  TROPONINI 0.82* 0.75* 0.22* 0.19* 0.14*   BNP (last 3 results)  Recent Labs  01/12/15 1611  BNP  345.8*    ProBNP (last 3 results) No results for input(s): PROBNP in the last 8760 hours.    CBG:  Recent Labs Lab 01/12/15 2136 01/13/15 0200 01/13/15 0400 01/13/15 0601 01/13/15 0730  GLUCAP 121* 119* 124* 141* 133*    Recent Results (from the past 240 hour(s))  Blood Culture (routine x 2)     Status: None (Preliminary result)   Collection Time: 01/08/15  7:57 AM  Result Value Ref Range Status   Specimen Description BLOOD RIGHT HAND  Final   Special Requests BOTTLES DRAWN AEROBIC AND ANAEROBIC B 4CC R 3CC  Final   Culture   Final           BLOOD CULTURE RECEIVED NO GROWTH TO  DATE CULTURE WILL BE HELD FOR 5 DAYS BEFORE ISSUING A FINAL NEGATIVE REPORT Performed at [facility]    Report Status PENDING  Incomplete  Blood Culture (routine x 2)     Status: None (Preliminary result)   Collection Time: 01/08/15  8:21 AM  Result Value Ref Range Status   Specimen Description BLOOD RIGHT ARM  Final   Special Requests BOTTLES DRAWN AEROBIC ONLY 5CC  Final   Culture   Final           BLOOD CULTURE RECEIVED NO GROWTH TO DATE CULTURE WILL BE HELD FOR 5 DAYS BEFORE ISSUING A FINAL NEGATIVE REPORT Performed at [facility]    Report Status PENDING  Incomplete  Urine culture     Status: None   Collection Time: 01/08/15  9:15 AM  Result Value Ref Range Status   Specimen Description URINE, CLEAN CATCH  Final   Special Requests NONE  Final   Colony Count NO GROWTH Performed at [facility]   Final   Culture NO GROWTH Performed at [facility]   Final   Report Status 01/09/2015 FINAL  Final  MRSA PCR Screening     Status: None   Collection Time: 01/08/15  4:22 PM  Result Value Ref Range Status   MRSA by PCR NEGATIVE NEGATIVE Final    Comment:        The GeneXpert MRSA Assay (FDA approved for NASAL specimens only), is one component of a comprehensive MRSA colonization surveillance program. It is not intended to diagnose MRSA infection nor to  guide or monitor treatment for MRSA infections.   Blood culture (routine x 2)     Status: None (Preliminary result)   Collection Time: 01/12/15  6:25 PM  Result Value Ref Range Status   Specimen Description BLOOD RIGHT HAND  Final   Special Requests BOTTLES DRAWN AEROBIC ONLY 4CC  Final   Culture PENDING  Incomplete   Report Status PENDING  Incomplete     Studies: Dg Chest 2 View  01/11/2015   CLINICAL DATA:  Hypotension and dehydration. History of breast cancer.  EXAM: CHEST  2 VIEW  COMPARISON:  01/09/2015.  FINDINGS: Worsening interstitial edema. Cardiomegaly. Congestive heart failure not excluded. No areas of focal consolidation. Small BILATERAL effusions. LEFT mastectomy. No osseous findings.  IMPRESSION: Worsening aeration. BILATERAL interstitial prominence consistent with early congestive failure.   Electronically Signed   By: Rolla Flatten M.D.   On: 01/11/2015 14:17   X-ray Chest Pa And Lateral  01/09/2015   CLINICAL DATA:  Sepsis, breast cancer  EXAM: CHEST  2 VIEW  COMPARISON:  01/08/2015  FINDINGS: Cardiomediastinal silhouette is stable. Persistent mild interstitial prominence bilaterally. Mild interstitial edema cannot be excluded. Small left pleural effusion left basilar atelectasis or or infiltrate. No segmental infiltrates. Right lung is clear.  IMPRESSION: Mild interstitial prominence bilateral suspicious for mild interstitial edema. Small left pleural effusion with left basilar atelectasis or infiltrate. Right lung is clear.   Electronically Signed   By: Lahoma Crocker M.D.   On: 01/09/2015 12:33   Ct Angio Chest Pe W/cm &/or Wo Cm  01/13/2015   CLINICAL DATA:  Shortness of breath, mild chest pain. Elevated D-dimer. History of breast cancer 3 years prior.  EXAM: CT ANGIOGRAPHY CHEST WITH CONTRAST  TECHNIQUE: Multidetector CT imaging of the chest was performed using the standard protocol during bolus administration of intravenous contrast. Multiplanar CT image reconstructions and  MIPs were obtained to evaluate the vascular  anatomy.  CONTRAST:  79mL OMNIPAQUE IOHEXOL 350 MG/ML SOLN  COMPARISON:  Chest radiographs from 01/08/2015 through 01/11/2015  FINDINGS: There are no filling defects within the pulmonary arteries to suggest pulmonary embolus.  The thoracic aorta is normal in caliber with mild atherosclerosis. There is mild diffuse shotty mediastinal and to a lesser extent hilar adenopathy, for example, lower paratracheal lymph node measures 8 mm in short axis dimension. Prevascular lymph node measures 5 mm. There is mild cardiomegaly and contrast refluxing into the hepatic veins and IVC.  Moderate bilateral pleural effusions with adjacent compressive atelectasis. No pericardial effusion. Smooth septal thickening and mild ground-glass opacity, in an upper lobe predominant distribution consistent with pulmonary edema. Linear reticular opacity in the anterior left upper lobe in a pattern consistent with post radiation change. Patient is post left lumpectomy. No discrete pulmonary nodule.  Hepatic granuloma noted. There is otherwise no acute abnormality in the included upper abdomen. Surgical clips noted in the inferior right breast.  There are no acute or suspicious osseous abnormalities. No discrete lytic or blastic osseous lesion.  Review of the MIP images confirms the above findings.  IMPRESSION: 1. No pulmonary embolus. 2. Findings consistent with congestive heart failure. 3. Shotty mediastinal and bilateral hilar adenopathy. This is likely reactive related to congestive heart failure. 4. Post left breast lumpectomy with post radiation change in the left upper lobe.   Electronically Signed   By: Jeb Levering M.D.   On: 01/13/2015 02:10   Dg Chest Port 1 View  01/08/2015   CLINICAL DATA:  Dizziness and right arm tingling  EXAM: PORTABLE CHEST - 1 VIEW  COMPARISON:  None.  FINDINGS: There is a degree of underlying emphysematous change. There is generalized interstitial prominence  without airspace consolidation. The heart is upper normal in size with mild pulmonary venous hypertension. No adenopathy. No bone lesions. There is calcification in the right carotid artery.  IMPRESSION: Findings felt to represent a degree of congestive heart failure superimposed on emphysematous change. No airspace consolidation. Calcification right carotid artery.   Electronically Signed   By: Lowella Grip III M.D.   On: 01/08/2015 08:41    Scheduled Meds: . apixaban  5 mg Oral BID  . aspirin EC  81 mg Oral Daily  . cefTRIAXone (ROCEPHIN)  IV  1 g Intravenous Q24H  . diltiazem  60 mg Oral 4 times per day  . diltiazem  60 mg Oral Once  . docusate sodium  100 mg Oral BID  . insulin pump   Subcutaneous TID AC, HS, 0200  . metoprolol tartrate  12.5 mg Oral BID  . sodium chloride  3 mL Intravenous Q12H  . thyroid  30 mg Oral Q48H   And  . [START ON 01/14/2015] thyroid  60 mg Oral Q48H  . vancomycin  750 mg Intravenous Q12H   Continuous Infusions: . sodium chloride 75 mL/hr at 01/12/15 2131    Principal Problem:   Sepsis Active Problems:   Essential hypertension   Diabetes type 2, controlled   Atrial fibrillation with RVR   Hypothyroidism    Time spent: 40 minutes   [facility] Pager [phone]. If 7PM-7AM, please contact night-coverage at [email or web address], password [facility] 01/13/2015, 10:45 AM  LOS: 1 day

## 2015-01-14 LAB — GLUCOSE, CAPILLARY
GLUCOSE-CAPILLARY: 125 mg/dL — AB (ref 70–99)
Glucose-Capillary: 126 mg/dL — ABNORMAL HIGH (ref 70–99)
Glucose-Capillary: 166 mg/dL — ABNORMAL HIGH (ref 70–99)
Glucose-Capillary: 208 mg/dL — ABNORMAL HIGH (ref 70–99)

## 2015-01-14 LAB — CBC
HCT: 29.3 % — ABNORMAL LOW (ref 36.0–46.0)
Hemoglobin: 9.7 g/dL — ABNORMAL LOW (ref 12.0–15.0)
MCH: 29.6 pg (ref 26.0–34.0)
MCHC: 33.1 g/dL (ref 30.0–36.0)
MCV: 89.3 fL (ref 78.0–100.0)
PLATELETS: 134 10*3/uL — AB (ref 150–400)
RBC: 3.28 MIL/uL — ABNORMAL LOW (ref 3.87–5.11)
RDW: 13.7 % (ref 11.5–15.5)
WBC: 4.5 10*3/uL (ref 4.0–10.5)

## 2015-01-14 LAB — COMPREHENSIVE METABOLIC PANEL
ALBUMIN: 2.6 g/dL — AB (ref 3.5–5.2)
ALK PHOS: 115 U/L (ref 39–117)
ALT: 78 U/L — ABNORMAL HIGH (ref 0–35)
ANION GAP: 4 — AB (ref 5–15)
AST: 73 U/L — ABNORMAL HIGH (ref 0–37)
BUN: 7 mg/dL (ref 6–23)
CALCIUM: 8.1 mg/dL — AB (ref 8.4–10.5)
CO2: 29 mmol/L (ref 19–32)
Chloride: 100 mmol/L (ref 96–112)
Creatinine, Ser: 0.64 mg/dL (ref 0.50–1.10)
GFR calc non Af Amer: 88 mL/min — ABNORMAL LOW (ref >90)
Glucose, Bld: 160 mg/dL — ABNORMAL HIGH (ref 70–99)
Potassium: 3.7 mmol/L (ref 3.5–5.1)
SODIUM: 133 mmol/L — AB (ref 135–145)
TOTAL PROTEIN: 5.3 g/dL — AB (ref 6.0–8.3)
Total Bilirubin: 0.4 mg/dL (ref 0.3–1.2)

## 2015-01-14 LAB — CULTURE, BLOOD (ROUTINE X 2)
CULTURE: NO GROWTH
Culture: NO GROWTH

## 2015-01-14 MED ORDER — FLECAINIDE ACETATE 50 MG PO TABS: 50.0000 mg | ORAL_TABLET | Freq: Two times a day (BID) | ORAL | Status: DC

## 2015-01-14 MED ORDER — DILTIAZEM HCL ER COATED BEADS 240 MG PO CP24: 240.0000 mg | ORAL_CAPSULE | Freq: Every day | ORAL | Status: DC

## 2015-01-14 MED ORDER — AZITHROMYCIN 500 MG PO TABS: 500.0000 mg | ORAL_TABLET | Freq: Every day | ORAL | Status: DC

## 2015-01-14 NOTE — Telephone Encounter (Signed)
Closed encounter °

## 2015-01-14 NOTE — Progress Notes (Signed)
Patient discharge teaching given, including activity, diet, follow-up appoints, and medications. Patient verbalized understanding of all discharge instructions. IV access was d/c'd. Vitals are stable. Skin is intact except as charted in most recent assessments. Pt to be escorted out by NT, to be driven home by family. 

## 2015-01-14 NOTE — Discharge Summary (Signed)
Physician Discharge Summary  Gina [name] MRN: [id number] DOB/AGE: Sep 24, 1946 69 y.o.  PCP: Leo Grosser, MD   Admit date: 01/12/2015 Discharge date: 01/14/2015  Discharge Diagnoses:     Principal Problem:   Sepsis Active Problems:   Essential hypertension   Diabetes type 2, controlled   Atrial fibrillation with RVR   Hypothyroidism   Chest pain  Follow-up recommendations Follow-up with PCP in 5-7 days Follow-up with cardiology as recommended by them Follow-up CBC, CMP in 1 week PCP to follow-up on respiratory panel     Medication List    STOP taking these medications        aspirin EC 81 MG tablet     NALTREXONE HCL PO     naproxen sodium 220 MG tablet  Commonly known as:  ANAPROX      TAKE these medications        acetaminophen 325 MG tablet  Commonly known as:  TYLENOL  Take 2 tablets (650 mg total) by mouth every 4 (four) hours as needed for headache or mild pain.     acidophilus Caps capsule  Take 1 capsule by mouth daily.     apixaban 5 MG Tabs tablet  Commonly known as:  ELIQUIS  Take 1 tablet (5 mg total) by mouth 2 (two) times daily.     azithromycin 500 MG tablet  Commonly known as:  ZITHROMAX  Take 1 tablet (500 mg total) by mouth daily.     Cod Liver Oil 1000 MG Caps  Take 2,000 mg by mouth 2 (two) times daily.     CoQ10 200 MG Caps  Take 200 mg by mouth daily.     cyanocobalamin 1000 MCG tablet  Take 1,000 mcg by mouth daily.     diltiazem 240 MG 24 hr capsule  Commonly known as:  CARDIZEM CD  Take 1 capsule (240 mg total) by mouth daily.     flecainide 50 MG tablet  Commonly known as:  TAMBOCOR  Take 1 tablet (50 mg total) by mouth every 12 (twelve) hours.     glucose 4 GM chewable tablet  Chew 16 g by mouth as needed for low blood sugar.     insulin lispro 100 UNIT/ML injection  Commonly known as:  HUMALOG  Basal rate 0.9 7am-12, 0.8 Noon-7am. Max of 60 units SQ every day with pump mer MD DX CODE: E10.65      magnesium oxide 400 MG tablet  Commonly known as:  MAG-OX  Take 400 mg by mouth daily.     metoprolol tartrate 25 MG tablet  Commonly known as:  LOPRESSOR  Take 0.5 tablets (12.5 mg total) by mouth 2 (two) times daily.     PROGESTERONE EX  Apply 1 application topically daily. Apply 4 clicks to chest daily. - compounded at [facility]     GLUCOSAMINE CHONDROITIN MSM PO  Take 1 tablet by mouth daily.     Red Yeast Rice 600 MG Tabs  Take 1,200 mg by mouth 2 (two) times daily.     thyroid 30 MG tablet  Commonly known as:  ARMOUR  Take 30-60 mg by mouth daily before breakfast. Take 1 tablet (30 mg) on day 1, then take 2 tablets (60 mg) on day 2, then repeat     vitamin C 1000 MG tablet  Take 1,000-2,000 mg by mouth 2 (two) times daily. Take 1 tablet (1000 mg) twice daily on day 1, then 1 tablet (1000 mg) every morning and  2 tablets (2000 mg) at dinner on day 2, then repeat     Vitamin D-3 5000 UNITS Tabs  Take 5,000 Units by mouth daily.        Discharge Condition: Stable   Disposition: 01-Home or Self Care   Consults:  Cardiology   Significant Diagnostic Studies: Dg Chest 2 View  01/11/2015   CLINICAL DATA:  Hypotension and dehydration. History of breast cancer.  EXAM: CHEST  2 VIEW  COMPARISON:  01/09/2015.  FINDINGS: Worsening interstitial edema. Cardiomegaly. Congestive heart failure not excluded. No areas of focal consolidation. Small BILATERAL effusions. LEFT mastectomy. No osseous findings.  IMPRESSION: Worsening aeration. BILATERAL interstitial prominence consistent with early congestive failure.   Electronically Signed   By: Rolla Flatten M.D.   On: 01/11/2015 14:17   X-ray Chest Pa And Lateral  01/09/2015   CLINICAL DATA:  Sepsis, breast cancer  EXAM: CHEST  2 VIEW  COMPARISON:  01/08/2015  FINDINGS: Cardiomediastinal silhouette is stable. Persistent mild interstitial prominence bilaterally. Mild interstitial edema cannot be excluded. Small left pleural  effusion left basilar atelectasis or or infiltrate. No segmental infiltrates. Right lung is clear.  IMPRESSION: Mild interstitial prominence bilateral suspicious for mild interstitial edema. Small left pleural effusion with left basilar atelectasis or infiltrate. Right lung is clear.   Electronically Signed   By: Lahoma Crocker M.D.   On: 01/09/2015 12:33   Ct Angio Chest Pe W/cm &/or Wo Cm  01/13/2015   CLINICAL DATA:  Shortness of breath, mild chest pain. Elevated D-dimer. History of breast cancer 3 years prior.  EXAM: CT ANGIOGRAPHY CHEST WITH CONTRAST  TECHNIQUE: Multidetector CT imaging of the chest was performed using the standard protocol during bolus administration of intravenous contrast. Multiplanar CT image reconstructions and MIPs were obtained to evaluate the vascular anatomy.  CONTRAST:  50mL OMNIPAQUE IOHEXOL 350 MG/ML SOLN  COMPARISON:  Chest radiographs from 01/08/2015 through 01/11/2015  FINDINGS: There are no filling defects within the pulmonary arteries to suggest pulmonary embolus.  The thoracic aorta is normal in caliber with mild atherosclerosis. There is mild diffuse shotty mediastinal and to a lesser extent hilar adenopathy, for example, lower paratracheal lymph node measures 8 mm in short axis dimension. Prevascular lymph node measures 5 mm. There is mild cardiomegaly and contrast refluxing into the hepatic veins and IVC.  Moderate bilateral pleural effusions with adjacent compressive atelectasis. No pericardial effusion. Smooth septal thickening and mild ground-glass opacity, in an upper lobe predominant distribution consistent with pulmonary edema. Linear reticular opacity in the anterior left upper lobe in a pattern consistent with post radiation change. Patient is post left lumpectomy. No discrete pulmonary nodule.  Hepatic granuloma noted. There is otherwise no acute abnormality in the included upper abdomen. Surgical clips noted in the inferior right breast.  There are no acute or  suspicious osseous abnormalities. No discrete lytic or blastic osseous lesion.  Review of the MIP images confirms the above findings.  IMPRESSION: 1. No pulmonary embolus. 2. Findings consistent with congestive heart failure. 3. Shotty mediastinal and bilateral hilar adenopathy. This is likely reactive related to congestive heart failure. 4. Post left breast lumpectomy with post radiation change in the left upper lobe.   Electronically Signed   By: Jeb Levering M.D.   On: 01/13/2015 02:10   Nm Myocar Multi W/spect W/wall Motion / Ef  01/13/2015   CLINICAL DATA:  69 year old female with elevated troponin, chest pain  EXAM: MYOCARDIAL IMAGING WITH SPECT (REST AND PHARMACOLOGIC-STRESS)  GATED LEFT VENTRICULAR WALL  MOTION STUDY  LEFT VENTRICULAR EJECTION FRACTION  TECHNIQUE: Standard myocardial SPECT imaging was performed after resting intravenous injection of 10 mCi Tc-24m sestamibi. Subsequently, intravenous infusion of Lexiscan was performed under the supervision of the Cardiology staff. At peak effect of the drug, 30 mCi Tc-85m sestamibi was injected intravenously and standard myocardial SPECT imaging was performed. Quantitative gated imaging was also performed to evaluate left ventricular wall motion, and estimate left ventricular ejection fraction.  COMPARISON:  None.  FINDINGS: Perfusion: There was homogeneous rate tracer uptake at both rest and stress. No decreased activity in the left ventricle on stress imaging to suggest reversible ischemia or infarction.  Wall Motion: Normal left ventricular wall motion. No left ventricular dilation.  Left Ventricular Ejection Fraction: 71 %  End diastolic volume 42 ml  End systolic volume 12 ml  IMPRESSION: 1. No reversible ischemia or infarction.  2. Normal left ventricular wall motion.  3. Left ventricular ejection fraction 71%  4. Low-risk stress test findings*.  *2012 Appropriate Use Criteria for Coronary Revascularization Focused Update: J Am Coll Cardiol.  0370;48(8):891-694. http://content.airportbarriers.com.aspx?articleid=[email or web address]   Electronically Signed   By: Candee Furbish   On: 01/13/2015 16:35   Dg Chest Port 1 View  01/08/2015   CLINICAL DATA:  Dizziness and right arm tingling  EXAM: PORTABLE CHEST - 1 VIEW  COMPARISON:  None.  FINDINGS: There is a degree of underlying emphysematous change. There is generalized interstitial prominence without airspace consolidation. The heart is upper normal in size with mild pulmonary venous hypertension. No adenopathy. No bone lesions. There is calcification in the right carotid artery.  IMPRESSION: Findings felt to represent a degree of congestive heart failure superimposed on emphysematous change. No airspace consolidation. Calcification right carotid artery.   Electronically Signed   By: Lowella Grip III M.D.   On: 01/08/2015 08:41      Microbiology: Recent Results (from the past 240 hour(s))  Blood Culture (routine x 2)     Status: None   Collection Time: 01/08/15  7:57 AM  Result Value Ref Range Status   Specimen Description BLOOD RIGHT HAND  Final   Special Requests BOTTLES DRAWN AEROBIC AND ANAEROBIC B 4CC R 3CC  Final   Culture   Final    NO GROWTH 5 DAYS Performed at [facility]    Report Status 01/14/2015 FINAL  Final  Blood Culture (routine x 2)     Status: None   Collection Time: 01/08/15  8:21 AM  Result Value Ref Range Status   Specimen Description BLOOD RIGHT ARM  Final   Special Requests BOTTLES DRAWN AEROBIC ONLY 5CC  Final   Culture   Final    NO GROWTH 5 DAYS Performed at [facility]    Report Status 01/14/2015 FINAL  Final  Urine culture     Status: None   Collection Time: 01/08/15  9:15 AM  Result Value Ref Range Status   Specimen Description URINE, CLEAN CATCH  Final   Special Requests NONE  Final   Colony Count NO GROWTH Performed at [facility]   Final   Culture NO GROWTH Performed at [facility]   Final   Report Status  01/09/2015 FINAL  Final  MRSA PCR Screening     Status: None   Collection Time: 01/08/15  4:22 PM  Result Value Ref Range Status   MRSA by PCR NEGATIVE NEGATIVE Final    Comment:        The GeneXpert MRSA Assay (FDA  approved for NASAL specimens only), is one component of a comprehensive MRSA colonization surveillance program. It is not intended to diagnose MRSA infection nor to guide or monitor treatment for MRSA infections.   Blood culture (routine x 2)     Status: None (Preliminary result)   Collection Time: 01/12/15  6:20 PM  Result Value Ref Range Status   Specimen Description BLOOD ARM RIGHT  Final   Special Requests BOTTLES DRAWN AEROBIC AND ANAEROBIC 5CC  Final   Culture   Final           BLOOD CULTURE RECEIVED NO GROWTH TO DATE CULTURE WILL BE HELD FOR 5 DAYS BEFORE ISSUING A FINAL NEGATIVE REPORT Performed at [facility]    Report Status PENDING  Incomplete  Blood culture (routine x 2)     Status: None (Preliminary result)   Collection Time: 01/12/15  6:25 PM  Result Value Ref Range Status   Specimen Description BLOOD RIGHT HAND  Final   Special Requests BOTTLES DRAWN AEROBIC ONLY 4CC  Final   Culture   Final           BLOOD CULTURE RECEIVED NO GROWTH TO DATE CULTURE WILL BE HELD FOR 5 DAYS BEFORE ISSUING A FINAL NEGATIVE REPORT Performed at [facility]    Report Status PENDING  Incomplete     Labs: Results for orders placed or performed during the hospital encounter of 01/12/15 (from the past 48 hour(s))  CBC     Status: Abnormal   Collection Time: 01/12/15  4:11 PM  Result Value Ref Range   WBC 8.5 4.0 - 10.5 K/uL   RBC 3.82 (L) 3.87 - 5.11 MIL/uL   Hemoglobin 11.5 (L) 12.0 - 15.0 g/dL    Comment: REPEATED TO VERIFY DELTA CHECK NOTED    HCT 34.2 (L) 36.0 - 46.0 %   MCV 89.5 78.0 - 100.0 fL   MCH 30.1 26.0 - 34.0 pg   MCHC 33.6 30.0 - 36.0 g/dL   RDW 13.5 11.5 - 15.5 %   Platelets 140 (L) 150 - 400 K/uL  Basic metabolic panel      Status: Abnormal   Collection Time: 01/12/15  4:11 PM  Result Value Ref Range   Sodium 134 (L) 135 - 145 mmol/L   Potassium 3.5 3.5 - 5.1 mmol/L   Chloride 97 96 - 112 mmol/L   CO2 21 19 - 32 mmol/L   Glucose, Bld 84 70 - 99 mg/dL   BUN 5 (L) 6 - 23 mg/dL   Creatinine, Ser 0.73 0.50 - 1.10 mg/dL   Calcium 8.9 8.4 - 10.5 mg/dL   GFR calc non Af Amer 85 (L) >90 mL/min   GFR calc Af Amer >90 >90 mL/min    Comment: (NOTE) The eGFR has been calculated using the CKD EPI equation. This calculation has not been validated in all clinical situations. eGFR's persistently <90 mL/min signify possible Chronic Kidney Disease.    Anion gap 16 (H) 5 - 15  BNP (order ONLY if patient complains of dyspnea/SOB AND you have documented it for THIS visit)     Status: Abnormal   Collection Time: 01/12/15  4:11 PM  Result Value Ref Range   B Natriuretic Peptide 345.8 (H) 0.0 - 100.0 pg/mL  I-stat troponin, ED (not at [facility])     Status: Abnormal   Collection Time: 01/12/15  4:43 PM  Result Value Ref Range   Troponin i, poc <0.20 (HH) 0.00 - 0.08 ng/mL  Comment: QUESTIONABLE RESULTS - CHARGE CREDITED REPEATED TO VERIFY CORRECTED ON 03/30 AT 1740: PREVIOUSLY REPORTED AS <0.20    Comment 3            Comment: Due to the release kinetics of cTnI, a negative result within the first hours of the onset of symptoms does not rule out myocardial infarction with certainty. If myocardial infarction is still suspected, repeat the test at appropriate intervals.   I-stat chem 8, ed     Status: Abnormal   Collection Time: 01/12/15  4:44 PM  Result Value Ref Range   Sodium 136 135 - 145 mmol/L   Potassium 3.5 3.5 - 5.1 mmol/L   Chloride 96 96 - 112 mmol/L   BUN 7 6 - 23 mg/dL   Creatinine, Ser 0.50 0.50 - 1.10 mg/dL   Glucose, Bld 80 70 - 99 mg/dL   Calcium, Ion 1.09 (L) 1.13 - 1.30 mmol/L   TCO2 23 0 - 100 mmol/L   Hemoglobin 13.3 12.0 - 15.0 g/dL   HCT 39.0 36.0 - 46.0 %  D-dimer, quantitative     Status:  Abnormal   Collection Time: 01/12/15  4:50 PM  Result Value Ref Range   D-Dimer, Quant 1.66 (H) 0.00 - 0.48 ug/mL-FEU    Comment:        AT THE INHOUSE ESTABLISHED CUTOFF VALUE OF 0.48 ug/mL FEU, THIS ASSAY HAS BEEN DOCUMENTED IN THE LITERATURE TO HAVE A SENSITIVITY AND NEGATIVE PREDICTIVE VALUE OF AT LEAST 98 TO 99%.  THE TEST RESULT SHOULD BE CORRELATED WITH AN ASSESSMENT OF THE CLINICAL PROBABILITY OF DVT / VTE.   I-stat troponin, ED     Status: None   Collection Time: 01/12/15  5:29 PM  Result Value Ref Range   Troponin i, poc 0.04 0.00 - 0.08 ng/mL   Comment 3            Comment: Due to the release kinetics of cTnI, a negative result within the first hours of the onset of symptoms does not rule out myocardial infarction with certainty. If myocardial infarction is still suspected, repeat the test at appropriate intervals.   Urinalysis, Routine w reflex microscopic     Status: Abnormal   Collection Time: 01/12/15  5:32 PM  Result Value Ref Range   Color, Urine YELLOW YELLOW   APPearance CLEAR CLEAR   Specific Gravity, Urine 1.009 1.005 - 1.030   pH 6.5 5.0 - 8.0   Glucose, UA NEGATIVE NEGATIVE mg/dL   Hgb urine dipstick NEGATIVE NEGATIVE   Bilirubin Urine NEGATIVE NEGATIVE   Ketones, ur 40 (A) NEGATIVE mg/dL   Protein, ur NEGATIVE NEGATIVE mg/dL   Urobilinogen, UA 0.2 0.0 - 1.0 mg/dL   Nitrite NEGATIVE NEGATIVE   Leukocytes, UA NEGATIVE NEGATIVE    Comment: MICROSCOPIC NOT DONE ON URINES WITH NEGATIVE PROTEIN, BLOOD, LEUKOCYTES, NITRITE, OR GLUCOSE <1000 mg/dL.  Blood culture (routine x 2)     Status: None (Preliminary result)   Collection Time: 01/12/15  6:20 PM  Result Value Ref Range   Specimen Description BLOOD ARM RIGHT    Special Requests BOTTLES DRAWN AEROBIC AND ANAEROBIC 5CC    Culture             BLOOD CULTURE RECEIVED NO GROWTH TO DATE CULTURE WILL BE HELD FOR 5 DAYS BEFORE ISSUING A FINAL NEGATIVE REPORT Performed at [facility]    Report  Status PENDING   Blood culture (routine x 2)     Status: None (Preliminary  result)   Collection Time: 01/12/15  6:25 PM  Result Value Ref Range   Specimen Description BLOOD RIGHT HAND    Special Requests BOTTLES DRAWN AEROBIC ONLY 4CC    Culture             BLOOD CULTURE RECEIVED NO GROWTH TO DATE CULTURE WILL BE HELD FOR 5 DAYS BEFORE ISSUING A FINAL NEGATIVE REPORT Performed at [facility]    Report Status PENDING   Glucose, capillary     Status: Abnormal   Collection Time: 01/12/15  9:36 PM  Result Value Ref Range   Glucose-Capillary 121 (H) 70 - 99 mg/dL  Glucose, capillary     Status: Abnormal   Collection Time: 01/13/15  2:00 AM  Result Value Ref Range   Glucose-Capillary 119 (H) 70 - 99 mg/dL  Glucose, capillary     Status: Abnormal   Collection Time: 01/13/15  4:00 AM  Result Value Ref Range   Glucose-Capillary 124 (H) 70 - 99 mg/dL  Glucose, capillary     Status: Abnormal   Collection Time: 01/13/15  6:01 AM  Result Value Ref Range   Glucose-Capillary 141 (H) 70 - 99 mg/dL  Basic metabolic panel     Status: Abnormal   Collection Time: 01/13/15  6:18 AM  Result Value Ref Range   Sodium 133 (L) 135 - 145 mmol/L   Potassium 3.6 3.5 - 5.1 mmol/L   Chloride 100 96 - 112 mmol/L   CO2 24 19 - 32 mmol/L   Glucose, Bld 150 (H) 70 - 99 mg/dL   BUN 6 6 - 23 mg/dL   Creatinine, Ser 0.67 0.50 - 1.10 mg/dL   Calcium 8.1 (L) 8.4 - 10.5 mg/dL   GFR calc non Af Amer 87 (L) >90 mL/min   GFR calc Af Amer >90 >90 mL/min    Comment: (NOTE) The eGFR has been calculated using the CKD EPI equation. This calculation has not been validated in all clinical situations. eGFR's persistently <90 mL/min signify possible Chronic Kidney Disease.    Anion gap 9 5 - 15  Magnesium     Status: None   Collection Time: 01/13/15  6:18 AM  Result Value Ref Range   Magnesium 1.7 1.5 - 2.5 mg/dL  CBC     Status: Abnormal   Collection Time: 01/13/15  6:18 AM  Result Value Ref Range   WBC  6.6 4.0 - 10.5 K/uL   RBC 3.47 (L) 3.87 - 5.11 MIL/uL   Hemoglobin 10.4 (L) 12.0 - 15.0 g/dL    Comment: REPEATED TO VERIFY SPECIMEN CHECKED FOR CLOTS    HCT 30.9 (L) 36.0 - 46.0 %   MCV 89.0 78.0 - 100.0 fL   MCH 30.0 26.0 - 34.0 pg   MCHC 33.7 30.0 - 36.0 g/dL   RDW 13.7 11.5 - 15.5 %   Platelets 133 (L) 150 - 400 K/uL  Glucose, capillary     Status: Abnormal   Collection Time: 01/13/15  7:30 AM  Result Value Ref Range   Glucose-Capillary 133 (H) 70 - 99 mg/dL  Influenza panel by PCR (type A & B, H1N1)     Status: None   Collection Time: 01/13/15 11:17 AM  Result Value Ref Range   Influenza A By PCR NEGATIVE NEGATIVE   Influenza B By PCR NEGATIVE NEGATIVE   H1N1 flu by pcr NOT DETECTED NOT DETECTED    Comment:        The Xpert Flu assay (FDA approved for  nasal aspirates or washes and nasopharyngeal swab specimens), is intended as an aid in the diagnosis of influenza and should not be used as a sole basis for treatment.   Glucose, capillary     Status: Abnormal   Collection Time: 01/13/15 11:36 AM  Result Value Ref Range   Glucose-Capillary 164 (H) 70 - 99 mg/dL  Glucose, capillary     Status: Abnormal   Collection Time: 01/13/15  1:39 PM  Result Value Ref Range   Glucose-Capillary 124 (H) 70 - 99 mg/dL  Glucose, capillary     Status: Abnormal   Collection Time: 01/13/15  4:26 PM  Result Value Ref Range   Glucose-Capillary 184 (H) 70 - 99 mg/dL  Glucose, capillary     Status: Abnormal   Collection Time: 01/13/15  8:12 PM  Result Value Ref Range   Glucose-Capillary 249 (H) 70 - 99 mg/dL  Glucose, capillary     Status: Abnormal   Collection Time: 01/14/15 12:08 AM  Result Value Ref Range   Glucose-Capillary 126 (H) 70 - 99 mg/dL  Glucose, capillary     Status: Abnormal   Collection Time: 01/14/15  4:02 AM  Result Value Ref Range   Glucose-Capillary 166 (H) 70 - 99 mg/dL  Comprehensive metabolic panel     Status: Abnormal   Collection Time: 01/14/15  6:21 AM   Result Value Ref Range   Sodium 133 (L) 135 - 145 mmol/L   Potassium 3.7 3.5 - 5.1 mmol/L   Chloride 100 96 - 112 mmol/L   CO2 29 19 - 32 mmol/L   Glucose, Bld 160 (H) 70 - 99 mg/dL   BUN 7 6 - 23 mg/dL   Creatinine, Ser 0.64 0.50 - 1.10 mg/dL   Calcium 8.1 (L) 8.4 - 10.5 mg/dL   Total Protein 5.3 (L) 6.0 - 8.3 g/dL   Albumin 2.6 (L) 3.5 - 5.2 g/dL   AST 73 (H) 0 - 37 U/L   ALT 78 (H) 0 - 35 U/L   Alkaline Phosphatase 115 39 - 117 U/L   Total Bilirubin 0.4 0.3 - 1.2 mg/dL   GFR calc non Af Amer 88 (L) >90 mL/min   GFR calc Af Amer >90 >90 mL/min    Comment: (NOTE) The eGFR has been calculated using the CKD EPI equation. This calculation has not been validated in all clinical situations. eGFR's persistently <90 mL/min signify possible Chronic Kidney Disease.    Anion gap 4 (L) 5 - 15    Comment: REPEATED TO VERIFY  CBC     Status: Abnormal   Collection Time: 01/14/15  6:21 AM  Result Value Ref Range   WBC 4.5 4.0 - 10.5 K/uL   RBC 3.28 (L) 3.87 - 5.11 MIL/uL   Hemoglobin 9.7 (L) 12.0 - 15.0 g/dL   HCT 29.3 (L) 36.0 - 46.0 %   MCV 89.3 78.0 - 100.0 fL   MCH 29.6 26.0 - 34.0 pg   MCHC 33.1 30.0 - 36.0 g/dL   RDW 13.7 11.5 - 15.5 %   Platelets 134 (L) 150 - 400 K/uL  Glucose, capillary     Status: Abnormal   Collection Time: 01/14/15  7:58 AM  Result Value Ref Range   Glucose-Capillary 125 (H) 70 - 99 mg/dL     HPI :Gina [name] is a 69 y/o F with recently diagnosed AF RVR (discharged this AM) with subsequent diastolic CHF, HTN, HLD, DM type 1 on insulin pump, hypothyroidism, breast CA who presents back to  [facility] with palpitations. She was recently admitted 01/08/15 with "severe sepsis" with hypotension into the 60s of unclear cause, HR 30s, treated with IV fluids and antibiotic therapy (blood and urine culture negative). She was discharged 01/10/15.2D Echo 3/28: EF 55-60%, no RWMA, trace MR and mild TR, PASP . She was at her PCP's office for followup 01/11/15  when she had a episode of dizziness and was found to be in atrial fibrillation with a rapid ventricular response. She was admitted to [facility] for further evaluation. She spontaneously converted to NSR on IV diltiazem and started started on low dose BB. Her ARB changed to Lopressor and Eliquis added. She has had an elevated troponin felt to be secondary to demand ischemia, possible plan for outpatient nuclear stress test. Her CXR suggested vascular congestion and she received one dose of po Lasix. She was discharged this AM in NSR. She has had 2 doses of Eliquis so far.  She went home and was feeling OK but this afternoon developed recurrence of tachypalpitations. She also reports feeling fatigued and has a headache. Temp 102.6, confirmed with recheck. She feels cold but denies overt rigors, malaise or nasal symptoms. On telemetry she has gone in and out of atrial flutter. Labs also notable for Hgb 11.5, Plt 140 (although Hgb earlier this AM was ?9.5 with platelets 113), Istat chem8 pending. BMET earlier this AM showed Na 136, K 3.5, BUN 7, Cr 0.66. TSH normal on 3/27. Note Hgb 12.8 in 08/2014, was 10.4 on last admission 01/08/15. Denies any unusual bleeding. She does endorse some chest discomfort upon inspiration. Had 9 hour flight to [place] on February 23. No recent LEE, LE erythema, pain, dyspnea or recent sick contacts.   HOSPITAL COURSE: Atrial fibrillation with rapid ventricular response Currently normal sinus rhythm Continue Cardizem, flecainide, Eliquis Metoprolol resumed by cardiology TSH within normal limits Maintaining sinus rhythm Follow-up in the A. fib clinic  Chest pain Elevated troponin, peak 0.22 Patient had a 2-D echo on 3/20 and that was within normal limits without any wall motion abnormalities Cardiology recommended a stress test Negative Lexiscan Myoview.  Seen by cardiology prior to discharge  Fever Negative influenza PCR and respiratory panel is still pending Previous  workup on 3/26 admission including blood culture, urine culture, influenza screen was negative CTA does not show any obvious pneumonia Continue azithromycin for suspected acute bronchitis  Hypothyroidism Continue armour  Diabetes Patient on an insulin pump We will consult diabetes coordinator  Abnormal d-dimer CT A- negative Venous Doppler negative  History of breast cancer status post left breast lumpectomy Mediastinal and bilateral hilar adenopathy likely reactive secondary to CHF      Discharge Exam:  Blood pressure 120/56, pulse 83, temperature 98.3 F (36.8 C), temperature source Oral, resp. rate 20, height 5\' 4"  (1.626 m), weight 69.4 kg (153 lb), SpO2 93 %.  General: Well developed, well nourished WF, in no acute distress. Head: Normocephalic, atraumatic, sclera non-icteric, no xanthomas, nares are without discharge. Neck: JVD not elevated. Lungs: Clear bilaterally to auscultation without wheezes, rales, or rhonchi. Breathing is unlabored. Heart: Tachycardic, regular, with S1 S2. No murmurs, rubs, or gallops appreciated. Abdomen: Soft, non-tender, non-distended with normoactive bowel sounds. No hepatomegaly. No rebound/guarding. No obvious abdominal masses       Discharge Instructions    Diet - low sodium heart healthy    Complete by:  As directed      Increase activity slowly    Complete by:  As directed  Follow-up Information    Follow up with [facility] TOM, MD. Schedule an appointment as soon as possible for a visit on 01/21/2015.   Specialty:  Family Medicine   Why:  Appointment eith Dr. Dennard Schaumann is on 01/21/15 at 9:15am   Contact information:   [address] [place] [address] [phone]       Follow up with [name], NP On 01/19/2015.   Specialty:  Nurse Practitioner   Why:  Appointment with Dr. Kayleen Memos is on 01/19/15 at 10:00am.  The appointment with Cecilie Kicks on 4/22 was cancelled.    Contact information:    [place] [address] [phone]       Signed: Reyne Dumas 01/14/2015, 11:04 AM

## 2015-01-14 NOTE — Progress Notes (Signed)
Subjective: No complaints  Objective: Vital signs in last 24 hours: Temp:  [98.3 F (36.8 C)-99.5 F (37.5 C)] 98.3 F (36.8 C) (04/01 0516) Pulse Rate:  [64-90] 83 (03/31 1628) Resp:  [18-20] 20 (04/01 0516) BP: (104-134)/(43-56) 113/48 mmHg (04/01 0516) SpO2:  [92 %-95 %] 93 % (04/01 0516) Last BM Date: 01/12/15  Intake/Output from previous day: 03/31 0701 - 04/01 0700 In: 2620 [P.O.:720; I.V.:1700; IV Piggyback:200] Out: 200 [Urine:200] Intake/Output this shift:    Medications Current Facility-Administered Medications  Medication Dose Route Frequency Provider Last Rate Last Dose  . 0.9 %  sodium chloride infusion   Intravenous Continuous Monica Becton, MD 75 mL/hr at 01/13/15 2002    . acetaminophen (TYLENOL) tablet 650 mg  650 mg Oral Q6H PRN Monica Becton, MD   650 mg at 01/13/15 2046   Or  . acetaminophen (TYLENOL) suppository 650 mg  650 mg Rectal Q6H PRN Monica Becton, MD      . apixaban (ELIQUIS) tablet 5 mg  5 mg Oral BID Padmaja Vasireddy, MD   5 mg at 01/13/15 2231  . azithromycin (ZITHROMAX) tablet 500 mg  500 mg Oral Daily Reyne Dumas, MD   500 mg at 01/13/15 1128  . cefTRIAXone (ROCEPHIN) 1 g in dextrose 5 % 50 mL IVPB - Premix  1 g Intravenous Q24H Padmaja Vasireddy, MD   1 g at 01/13/15 2225  . diltiazem (CARDIZEM CD) 24 hr capsule 240 mg  240 mg Oral Daily Rhonda G Barrett, PA-C      . docusate sodium (COLACE) capsule 100 mg  100 mg Oral BID Monica Becton, MD   100 mg at 01/13/15 0945  . flecainide (TAMBOCOR) tablet 50 mg  50 mg Oral Q12H Rhonda G Barrett, PA-C   50 mg at 01/13/15 2231  . insulin pump   Subcutaneous TID AC, HS, 0200 Jeryl Columbia, NP      . magnesium hydroxide (MILK OF MAGNESIA) suspension 30 mL  30 mL Oral Daily PRN Padmaja Vasireddy, MD      . metoprolol tartrate (LOPRESSOR) tablet 12.5 mg  12.5 mg Oral BID Monica Becton, MD   12.5 mg at 01/13/15 2234  . ondansetron (ZOFRAN) tablet 4 mg  4 mg Oral Q6H PRN  Monica Becton, MD       Or  . ondansetron (ZOFRAN) injection 4 mg  4 mg Intravenous Q6H PRN Padmaja Vasireddy, MD      . sodium chloride 0.9 % injection 3 mL  3 mL Intravenous Q12H Padmaja Vasireddy, MD   3 mL at 01/12/15 2200  . thyroid (ARMOUR) tablet 30 mg  30 mg Oral Q48H Kimberly B Hammons, RPH   30 mg at 01/13/15 0750   And  . thyroid (ARMOUR) tablet 60 mg  60 mg Oral Q48H Kimberly B Hammons, RPH      . vancomycin (VANCOCIN) IVPB 750 mg/150 ml premix  750 mg Intravenous Q12H Franky Macho, RPH   750 mg at 01/13/15 2225   Facility-Administered Medications Ordered in Other Encounters  Medication Dose Route Frequency Provider Last Rate Last Dose  . topical emolient (BIAFINE) emulsion   Topical Daily Eppie Gibson, MD        PE: General appearance: alert, cooperative and no distress Lungs: clear to auscultation bilaterally Heart: regular rate and rhythm, S1, S2 normal, no murmur, click, rub or gallop Extremities: No LEE Pulses: 2+ and symmetric Skin: Warm and dry Neurologic: Grossly normal  Lab Results:   Recent Labs  01/12/15 1611 01/12/15 1644 01/13/15 0618 01/14/15 0621  WBC 8.5  --  6.6 4.5  HGB 11.5* 13.3 10.4* 9.7*  HCT 34.2* 39.0 30.9* 29.3*  PLT 140*  --  133* 134*   BMET  Recent Labs  01/12/15 0300 01/12/15 1611 01/12/15 1644 01/13/15 0618  NA 136 134* 136 133*  K 3.5 3.5 3.5 3.6  CL 103 97 96 100  CO2 26 21  --  24  GLUCOSE 117* 84 80 150*  BUN 7 5* 7 6  CREATININE 0.66 0.73 0.50 0.67  CALCIUM 8.3* 8.9  --  8.1*   PT/INR  Recent Labs  01/11/15 1510  LABPROT 13.1  INR 0.98   Cholesterol  Recent Labs  01/12/15 0253  CHOL 165   Lipid Panel     Component Value Date/Time   CHOL 165 01/12/2015 0253   TRIG 56 01/12/2015 0253   HDL 54 01/12/2015 0253   CHOLHDL 3.1 01/12/2015 0253   VLDL 11 01/12/2015 0253   LDLCALC 100* 01/12/2015 0253   LDLDIRECT 148.4 08/06/2013 0852     Assessment/Plan  Principal Problem:   Sepsis Active  Problems:   Essential hypertension  Stable   Diabetes type 2, controlled   Atrial fibrillation with RVR Maintaining NSR since yesterday morning.  Now on flecainide 50mg  BID, cardizem 240QD and lopressor 12.5 bid. BP seems to be tolerating the meds.  SP lexiscan cardiolite which was low risk.   Echo 03/28 w/ nl EF, no WMA   Hypothyroidism   Chest pain  Troponin elevated to 0.22 and related to elevated HR during Afib RVR. Negative Lexiscan Myoview.   LOS: 2 days    HAGER, BRYAN PA-C 01/14/2015 8:45 AM  History and all data above reviewed.  Patient examined.  I agree with the findings as above.   No chest pain, no SOB. The patient exam reveals COR:RRR  ,  Lungs: Clear  ,  Abd: Positive bowel sounds, no rebound no guarding, Ext No edema  .  All available labs, radiology testing, previous records reviewed. Agree with documented assessment and plan. Atrial fib:  Maintaining NSR.  Continue current meds.  Negative Lexiscan Myoview.  Follow up in afib clinic is in [place].  Jeneen Rinks Jaiyanna Safran  10:06 AM  01/14/2015

## 2015-01-15 LAB — RESPIRATORY VIRUS PANEL
ADENOVIRUS: NEGATIVE
INFLUENZA B 1: NEGATIVE
Influenza A: NEGATIVE
METAPNEUMOVIRUS: NEGATIVE
Parainfluenza 1: NEGATIVE
Parainfluenza 2: NEGATIVE
Parainfluenza 3: NEGATIVE
RESPIRATORY SYNCYTIAL VIRUS A: NEGATIVE
RESPIRATORY SYNCYTIAL VIRUS B: NEGATIVE
Rhinovirus: NEGATIVE

## 2015-01-17 ENCOUNTER — Encounter: Payer: Self-pay | Admitting: Endocrinology

## 2015-01-17 ENCOUNTER — Ambulatory Visit (INDEPENDENT_AMBULATORY_CARE_PROVIDER_SITE_OTHER): Payer: Medicare Other | Admitting: Endocrinology

## 2015-01-17 VITALS — BP 115/65 | HR 65 | Temp 97.7°F | Resp 12 | Wt 147.8 lb

## 2015-01-17 DIAGNOSIS — E1065 Type 1 diabetes mellitus with hyperglycemia: Secondary | ICD-10-CM

## 2015-01-17 DIAGNOSIS — E038 Other specified hypothyroidism: Secondary | ICD-10-CM

## 2015-01-17 DIAGNOSIS — I10 Essential (primary) hypertension: Secondary | ICD-10-CM | POA: Diagnosis not present

## 2015-01-17 DIAGNOSIS — E7800 Pure hypercholesterolemia, unspecified: Secondary | ICD-10-CM

## 2015-01-17 DIAGNOSIS — E063 Autoimmune thyroiditis: Secondary | ICD-10-CM

## 2015-01-17 DIAGNOSIS — IMO0002 Reserved for concepts with insufficient information to code with codable children: Secondary | ICD-10-CM

## 2015-01-17 NOTE — Progress Notes (Signed)
Patient ID: Gina [name], female   DOB: 1946-07-02, 69 y.o.   MRN: [id number]   Reason for Appointment: Insulin Pump followup:   History of Present Illness   Diagnosis: Type 1 DIABETES MELITUS, date of diagnosis:  1984     CURRENT insulin pump:  One Touch Ping, has been on a pump since 1995  HISTORY:  She has had overall fair control of her diabetes with insulin pump   The pump SETTINGS are: Midnight = 0.45, 5 AM = 0.9, 7 AM = 1.0; 10 AM = 1.1, 12 noon = 0.9, 4 PM = 0.8 and 9 PM = 0.75. Carbohydrate ratio 1:8.  Blood sugar target 110   Previous A1c has been as high as 7.6 but has been below 7 the last 2 times On her last visit her early morning basal rate was increased to cover her high fasting readings and the 12 noon basal rate was reduced to avoid early afternoon hypoglycemia; blood sugars appear to be better at those times with these changes  Current blood sugar patterns and problems identified:   Fasting blood sugars are somewhat variable but not consistently high  Postprandial readings are variable at times with some high readings after breakfast somewhat lower readings after lunch generally and variable readings after supper  Postprandial readings are difficult to analyze consistently because of not entering carbohydrates at times when she is eating a low carbohydrate meal.   She has sporadic hypoglycemia, mostly midday or afternoon and occasionally after doing a bolus for a meal or correction   Overall her highest blood sugars are around 9-10 AM with average about 165; also relatively high around 6-7 PM with average 161  She has tried to avoid hypoglycemia with increased physical activity with suspending or reducing her basal rate as well as trying to add extra carbohydrate or use a glucose tablet if she starts feeling the sugar getting low  GLUCOSE CONTROL with the pump is assessed today by pump download.    PRE-MEAL Breakfast Lunch Dinner Bedtime Overall  Glucose  range:  105-215   56-281   74-266  103-327    Mean/median:  158      149+/-59    POST-MEAL PC Breakfast PC Lunch PC Dinner  Glucose range:   47-209   70-327   Mean/median:       Pump management: She is bolusing 3.9  times a day, average carbohydrate intake 59  g daily  EXERCISE:  Some farming activities and also walking 30 minutes briskly at times  Wt Readings from Last 3 Encounters:  01/17/15 147 lb 12.8 oz (67.042 kg)  01/12/15 153 lb (69.4 kg)  01/11/15 157 lb 8 oz (71.442 kg)    LABS:   A1c 6.8 from outside lab on 09/13/14; previously 6.7 on 06/24/14  Lab Results  Component Value Date   HGBA1C 7.1* 01/11/2015   HGBA1C 7.2* 01/09/2015   HGBA1C 7.4* 04/26/2014   Lab Results  Component Value Date   MICROALBUR 0.2 11/30/2013   LDLCALC 100* 01/12/2015   CREATININE 0.64 01/14/2015       Medication List       This list is accurate as of: 01/17/15 12:04 PM.  Always use your most recent med list.               acetaminophen 325 MG tablet  Commonly known as:  TYLENOL  Take 2 tablets (650 mg total) by mouth every 4 (four) hours as needed for headache or mild pain.  acidophilus Caps capsule  Take 1 capsule by mouth daily.     apixaban 5 MG Tabs tablet  Commonly known as:  ELIQUIS  Take 1 tablet (5 mg total) by mouth 2 (two) times daily.     azithromycin 500 MG tablet  Commonly known as:  ZITHROMAX  Take 1 tablet (500 mg total) by mouth daily.     Cod Liver Oil 1000 MG Caps  Take 2,000 mg by mouth 2 (two) times daily.     CoQ10 200 MG Caps  Take 200 mg by mouth daily.     cyanocobalamin 1000 MCG tablet  Take 1,000 mcg by mouth daily.     diltiazem 240 MG 24 hr capsule  Commonly known as:  CARDIZEM CD  Take 1 capsule (240 mg total) by mouth daily.     flecainide 50 MG tablet  Commonly known as:  TAMBOCOR  Take 1 tablet (50 mg total) by mouth every 12 (twelve) hours.     glucose 4 GM chewable tablet  Chew 16 g by mouth as needed for low blood sugar.      insulin lispro 100 UNIT/ML injection  Commonly known as:  HUMALOG  Basal rate 0.9 7am-12, 0.8 Noon-7am. Max of 60 units SQ every day with pump mer MD DX CODE: E10.65     magnesium oxide 400 MG tablet  Commonly known as:  MAG-OX  Take 400 mg by mouth daily.     metoprolol tartrate 25 MG tablet  Commonly known as:  LOPRESSOR  Take 0.5 tablets (12.5 mg total) by mouth 2 (two) times daily.     PROGESTERONE EX  Apply 1 application topically daily. Apply 4 clicks to chest daily. - compounded at [place] MSM PO  Take 1 tablet by mouth daily.     Red Yeast Rice 600 MG Tabs  Take 1,200 mg by mouth 2 (two) times daily.     thyroid 30 MG tablet  Commonly known as:  ARMOUR  Take 30-60 mg by mouth daily before breakfast. Take 1 tablet (30 mg) on day 1, then take 2 tablets (60 mg) on day 2, then repeat     vitamin C 1000 MG tablet  Take 1,000-2,000 mg by mouth 2 (two) times daily. Take 1 tablet (1000 mg) twice daily on day 1, then 1 tablet (1000 mg) every morning and 2 tablets (2000 mg) at dinner on day 2, then repeat     Vitamin D-3 5000 UNITS Tabs  Take 5,000 Units by mouth daily.        Allergies:  Allergies  Allergen Reactions  . Statins Other (See Comments)    Leg pain    Past Medical History  Diagnosis Date  . Ovarian cyst   . Hypertension   . Insulin pump in place   . 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 Anxiety     new dx  . Hyperlipidemia   . S/P radiation therapy 04/10/12- 05/26/2012    left Breast and Axilla / 50 gy / 25 Fractions with Left Breast Boost / 10 Gy / 5 Fractions  . Use of letrozole (Femara) 06/13/12  . Diabetes mellitus type 1     Insulin pump  . Hypothyroidism   . Diastolic CHF     a. 11/9560 - due to AF RVR.  Marland Kitchen Atrial fibrillation  Past Surgical History  Procedure Laterality Date  .  Tonsillectomy    . Ovarian cyst surgery    . Colonoscopy      1 polyp   . 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 Appendectomy      Family History  Problem Relation Age of Onset  . Breast cancer Mother 61  . Prostate cancer Maternal Uncle     died in his 49s    Social History:  reports that she has never smoked. She does not have any smokeless tobacco history on file. She reports that she does not drink alcohol or use illicit drugs.  REVIEW of systems:   She has a history of mild hypothyroidism followed by PCP   Lab Results  Component Value Date   TSH 1.449 01/11/2015    HYPERCHOLESTEROLEMIA: Her LDL has been persistently high and refuses to take a statin drug including pravastatin even though it has had no side effects previously She was told by her wellness  physician that she does not meet treatment because her particle size is normal  However her LDL particle number is consistently high and now about the same at 1621  Previous LDL of 161  from outside lab LDL is better in the hospital but she had been acutely ill She is trying to use red yeast rice   Lab Results  Component Value Date   CHOL 165 01/12/2015   HDL 54 01/12/2015   LDLCALC 100* 01/12/2015   LDLDIRECT 148.4 08/06/2013   TRIG 56 01/12/2015   CHOLHDL 3.1 01/12/2015    BP not checked at home; feels good recently despite episodes of low blood pressure requiring admission previously which was for unclear reasons. Also is taking treatment for atrial fibrillation  EXAM:  BP 115/65 mmHg  Pulse 65  Temp(Src) 97.7 F (36.5 C) (Oral)  Resp 12  Wt 147 lb 12.8 oz (67.042 kg)  SpO2 98%   ASSESSMENT:  DIABETES: Her blood sugars are continuing to be overall well controlled with A1c around 7% See history of present illness for detailed discussion  of current blood sugar levels, abnormal patterns, problems identified More recently with changing her basal rates on her last visit she appears to have overall excellent blood sugar patterns She does have sporadic high readings which may sometimes persist and not clear why. Also her fasting readings are sporadically high even though she is sometimes getting up early in the morning to check her blood sugar Occasional difficulty with postprandial coverage include missing her bolus at the mealtime or not estimating her carbohydrate intake accurately for certain meals For now she will continue her settings unchanged and call if she has any inconsistent patterns of high or low sugars  HYPERTENSION:  Fairly well controlled although blood pressures are low normal today without orthostasis Does not have any clinical features suggestive of adrenal insufficiency Currently her pressure is managed with diltiazem and low-dose metoprolol which are being used for her atrial tachycardia She will discuss issues with her medications and questions about flecainide with cardiologist  HYPERLIPIDEMIA: Will need to follow-up her lipids on her next visit, recent LDL appears to be lower but possibly from intercurrent illness  Counseling time over 50% of today's 25 minute visit  There are no Patient Instructions on file for this visit.   Ladye Macnaughton 01/17/2015, 12:04 PM

## 2015-01-19 ENCOUNTER — Encounter (HOSPITAL_COMMUNITY): Payer: Self-pay | Admitting: Nurse Practitioner

## 2015-01-19 ENCOUNTER — Other Ambulatory Visit: Payer: Self-pay

## 2015-01-19 ENCOUNTER — Ambulatory Visit (HOSPITAL_COMMUNITY)
Admit: 2015-01-19 | Discharge: 2015-01-19 | Disposition: A | Discharge status: Home / Self Care | Payer: Medicare Other | Source: Ambulatory Visit | Attending: Nurse Practitioner | Admitting: Nurse Practitioner

## 2015-01-19 DIAGNOSIS — I480 Paroxysmal atrial fibrillation: Secondary | ICD-10-CM | POA: Insufficient documentation

## 2015-01-19 DIAGNOSIS — Z79899 Other long term (current) drug therapy: Secondary | ICD-10-CM | POA: Diagnosis not present

## 2015-01-19 DIAGNOSIS — Z7902 Long term (current) use of antithrombotics/antiplatelets: Secondary | ICD-10-CM | POA: Diagnosis not present

## 2015-01-19 DIAGNOSIS — Z853 Personal history of malignant neoplasm of breast: Secondary | ICD-10-CM | POA: Diagnosis not present

## 2015-01-19 DIAGNOSIS — E039 Hypothyroidism, unspecified: Secondary | ICD-10-CM | POA: Insufficient documentation

## 2015-01-19 DIAGNOSIS — I5030 Unspecified diastolic (congestive) heart failure: Secondary | ICD-10-CM | POA: Insufficient documentation

## 2015-01-19 DIAGNOSIS — E109 Type 1 diabetes mellitus without complications: Secondary | ICD-10-CM | POA: Insufficient documentation

## 2015-01-19 DIAGNOSIS — I10 Essential (primary) hypertension: Secondary | ICD-10-CM | POA: Insufficient documentation

## 2015-01-19 DIAGNOSIS — Z9641 Presence of insulin pump (external) (internal): Secondary | ICD-10-CM | POA: Diagnosis not present

## 2015-01-19 DIAGNOSIS — E785 Hyperlipidemia, unspecified: Secondary | ICD-10-CM | POA: Insufficient documentation

## 2015-01-19 LAB — CULTURE, BLOOD (ROUTINE X 2)
Culture: NO GROWTH
Culture: NO GROWTH

## 2015-01-19 NOTE — Patient Instructions (Signed)
Your physician has requested that you have an exercise tolerance test. For further information please visit [email or web address]. Please also follow instruction sheet, as given.  The phone number is [phone]  Your physician recommends that you schedule a follow-up appointment in: 6-8 weeks with cardiologist - please let me know which physician you would like to be set up with [phone]

## 2015-01-19 NOTE — Progress Notes (Signed)
Patient ID: Gina [name], female   DOB: 1946/09/25, 69 y.o.   MRN: [id number]     Primary Care Physician: Odette Fraction, MD Primary Cardiologist: Primary Electrophysiologist:Allred Referring Physician: [facility] F/U   Gina [name] is a 69 y.o. female with a h/o Type 1 DM, h/o left breast ca with radiation/lumpectomy, paroxysmal atrial fibrillation who presents for consultation in the [facility].  The patient was initially diagnosed with atrial fibrillation  after presenting with symptoms of dizziness and bilateral arm tingling at her PCP's office 3/29 with AFib with RVR and was admitted to [facility] for further treatment.  She has undergone a nuclear study which revealed normal EF and an echo with normal EF 55-60% with normal wall motion, trace MR/mild TR. Left atrial size 36 mm. She has been placed on flecainide/cardizem/toprol and eliquis 70m bid for cha2ds2vasc score of at least 3. CXR showed vascular congestion and was given lasix x one dose. She is tolerating current medications. She has been maintaining NSR.  Today, she denies symptoms of palpitations, chest pain, shortness of breath, orthopnea, PND, lower extremity edema, dizziness, presyncope, syncope. She does have h/o snoring with minimal daytime somnolence.No bleeding, or neurologic sequela. The patient is tolerating medications without difficulties and is otherwise without complaint today.    Atrial Fibrillation Risk Factors:  she   does have symptoms or diagnosis of sleep apnea.  she does not have a history of rheumatic fever.  she does not have a history of alcohol use.  she has a BMI of Body mass index is 24.81 kg/(m^2)..Marland KitchenFiled Weights   01/19/15 0946  Weight: 144 lb 9.6 oz (65.59 kg)    LA size: 390m  Atrial Fibrillation Management history:  Previous antiarrhythmic drugs: currently flecainide  Previous cardioversions: none  Previous ablations: none  CHADS2VASC score: at  least 3  Anticoagulation history: eliquis   Past Medical History  Diagnosis Date  . Ovarian cyst   . Hypertension   . Insulin pump in place   . 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 Anxiety     new dx  . Hyperlipidemia   . S/P radiation therapy 04/10/12- 05/26/2012    left Breast and Axilla / 50 gy / 25 Fractions with Left Breast Boost / 10 Gy / 5 Fractions  . Use of letrozole (Femara) 06/13/12  . Diabetes mellitus type 1     Insulin pump  . Hypothyroidism   . Diastolic CHF     a. 12/21/319 due to AF RVR.  . Marland Kitchentrial fibrillation    Past Surgical History  Procedure Laterality Date  . Tonsillectomy    . Ovarian cyst surgery    . Colonoscopy      1 polyp   . 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 Appendectomy      Current Outpatient Prescriptions  Medication Sig Dispense Refill  . acetaminophen (TYLENOL) 325 MG tablet Take 2 tablets (650 mg total) by mouth every 4 (four) hours as needed for headache or mild pain.    . Marland Kitchencidophilus (RISAQUAD) CAPS capsule Take 1 capsule by mouth daily. 10 capsule 0  . apixaban (ELIQUIS) 5 MG TABS tablet Take 1 tablet (5 mg total) by mouth 2 (  two) times daily. 60 tablet 11  . Ascorbic Acid (VITAMIN C) 1000 MG tablet Take 1,000-2,000 mg by mouth 2 (two) times daily. Take 1 tablet (1000 mg) twice daily on day 1, then 1 tablet (1000 mg) every morning and 2 tablets (2000 mg) at dinner on day 2, then repeat    . Cholecalciferol (VITAMIN D-3) 5000 UNITS TABS Take 5,000 Units by mouth daily.     . Cod Liver Oil 1000 MG CAPS Take 2,000 mg by mouth 2 (two) times daily.     . Coenzyme Q10 (COQ10) 200 MG CAPS Take 200 mg by mouth daily.    . cyanocobalamin 1000 MCG tablet Take 1,000 mcg by  mouth daily.     . diltiazem (CARDIZEM CD) 240 MG 24 hr capsule Take 1 capsule (240 mg total) by mouth daily. 30 capsule 2  . flecainide (TAMBOCOR) 50 MG tablet Take 1 tablet (50 mg total) by mouth every 12 (twelve) hours. 60 tablet 1  . glucose 4 GM chewable tablet Chew 16 g by mouth as needed for low blood sugar.     . insulin lispro (HUMALOG) 100 UNIT/ML injection Basal rate 0.9 7am-12, 0.8 Noon-7am. Max of 60 units SQ every day with pump mer MD DX CODE: E10.65 (Patient taking differently: Insulin pump - Basal rate from 12 am-4:59 am at 0.45 units/hr, 5 am-6:59 am at 0.9 units/hr, 7 am-9:59 am at 1 unit/hr, 10 am-11:59 am at 1.1 units/hr, 12 pm-3:59 pm at 0.9 units/hr, 4 pm-8:59 at 0.8 units/hr, 9 pm-11:59 pm at 0.75 units/hr. Max of 100 units SQ every day; max of 50 units SQ in a 2 hr time frame with pump mer MD DX CODE: E10.65) 20 mL 6  . magnesium oxide (MAG-OX) 400 MG tablet Take 400 mg by mouth daily.    . metoprolol tartrate (LOPRESSOR) 25 MG tablet Take 0.5 tablets (12.5 mg total) by mouth 2 (two) times daily. 30 tablet 11  . Misc Natural Products (GLUCOSAMINE CHONDROITIN MSM PO) Take 1 tablet by mouth daily.    . Misc Natural Products (PROGESTERONE EX) Apply 1 application topically daily. Apply 4 clicks to chest daily. - compounded at [facility]    . Red Yeast Rice 600 MG TABS Take 1,200 mg by mouth 2 (two) times daily.     . thyroid (ARMOUR) 30 MG tablet Take 30-60 mg by mouth daily before breakfast. Take 1 tablet (30 mg) on day 1, then take 2 tablets (60 mg) on day 2, then repeat     No current facility-administered medications for this encounter.   Facility-Administered Medications Ordered in Other Encounters  Medication Dose Route Frequency Provider Last Rate Last Dose  . topical emolient (BIAFINE) emulsion   Topical Daily Sarah Squire, MD        Allergies  Allergen Reactions  . Statins Other (See Comments)    Leg pain    History   Social History  . Marital  Status: Married    Spouse Name: N/A  . Number of Children: N/A  . Years of Education: N/A   Occupational History  . Not on file.   Social History Main Topics  . Smoking status: Never Smoker   . Smokeless tobacco: Not on file  . Alcohol Use: No     Comment: menarche age 11,menopause age 45,hrt short time oral,then progesterone cream ,stopped g3,p3,1st age 16  . Drug Use: No  . Sexual Activity: Yes   Other Topics Concern  . Not on file     Social History Narrative    Family History  Problem Relation Age of Onset  . Breast cancer Mother 57  . Prostate cancer Maternal Uncle     died in his 60s   The patientdoes not have a history of early familial atrial fibrillation or other arrhythmias.  ROS- All systems are reviewed and negative except as per the HPI above.  Physical Exam: Filed Vitals:   01/19/15 0946  BP: 118/78  Pulse: 56  Height: 5' 4" (1.626 m)  Weight: 144 lb 9.6 oz (65.59 kg)    GEN- The patient is well appearing, alert and oriented x 3 today.   Head- normocephalic, atraumatic Eyes-  Sclera clear, conjunctiva pink Ears- hearing intact Oropharynx- clear Neck- supple, no JVP Lymph- no cervical lymphadenopathy Lungs- Clear to ausculation bilaterally, normal work of breathing Heart- Regular rate and rhythm, no murmurs, rubs or gallops, PMI not laterally displaced GI- soft, NT, ND, + BS Extremities- no clubbing, cyanosis, or edema MS- no significant deformity or atrophy Skin- no rash or lesion Psych- euthymic mood, full affect Neuro- strength and sensation are intact  EKG EKG shows Sinus bradycardia, 56 bpm, rightward axis, PR 160 ms, QRS 94 ms, QTc 428 ms.  Echo Left ventricle: The cavity size was normal. Wall thickness was normal. Systolic function was normal. The estimated ejection fraction was in the range of 55% to 60%. Wall motion was normal; there were no regional wall motion abnormalities. Left ventricular diastolic function parameters  were normal. - Pulmonary arteries: PA peak pressure: 39 mm Hg (S).  Nuclear stress test- 01/13/15- IMPRESSION: 1. No reversible ischemia or infarction.  2. Normal left ventricular wall motion.  3. Left ventricular ejection fraction 71%  4. Low-risk stress test findings*.  Epic records are reviewed at length today  Assessment and Plan:  1. Atrial fibrillation The patient has Symptomatic paroxysmal atrial fibrillation.  The patients CHAD2VASC score is at least 3.  she is  appropriately anticoagulated at this time. The patient is adequately rate controlled with metoprolol and cardizem. Antiarrhythmic therapy to dates has included flecainide  The patients left atrial size is 36 mm.  Additional echo findings include normal LV function. A long discussion with the patient was had today regarding therapeutic strategies.  Extensive discussion of lifestyle modification including begin progressive daily aerobic exercise program was also discussed.  Presently, our recommendations include  1. PAF Currently maintaining NSR Continue flecainide, metoprolol and Cardizem GXT on flecainide scheduled.  2. Healthy lifestyle She already has a walking program and encouraged to continue on a regular  basis. 3. Obstructive sleep apnea The importance of adequate treatment of sleep apnea was discussed today in order to improve our ability to maintain sinus rhythm long term. Evan though she snores and has mild  Daytime somnolence, she thinks sleep studies are rip offs and kindly refuses the study.  4. F/u with Dr. Allred in 6-8 weeks.   Donna Carroll NP  Nurse Practitioner, [facility] 01/19/2015 11:07 AM   

## 2015-01-21 ENCOUNTER — Encounter: Payer: Self-pay | Admitting: Family Medicine

## 2015-01-21 ENCOUNTER — Ambulatory Visit (INDEPENDENT_AMBULATORY_CARE_PROVIDER_SITE_OTHER): Payer: Medicare Other | Admitting: Family Medicine

## 2015-01-21 ENCOUNTER — Other Ambulatory Visit: Payer: Self-pay | Admitting: Family Medicine

## 2015-01-21 VITALS — BP 118/62 | HR 60 | Temp 97.6°F | Resp 16 | Ht 63.0 in | Wt 144.0 lb

## 2015-01-21 DIAGNOSIS — Z09 Encounter for follow-up examination after completed treatment for conditions other than malignant neoplasm: Secondary | ICD-10-CM

## 2015-01-21 DIAGNOSIS — I480 Paroxysmal atrial fibrillation: Secondary | ICD-10-CM | POA: Diagnosis not present

## 2015-01-21 DIAGNOSIS — D508 Other iron deficiency anemias: Secondary | ICD-10-CM | POA: Diagnosis not present

## 2015-01-21 DIAGNOSIS — I9589 Other hypotension: Secondary | ICD-10-CM

## 2015-01-21 LAB — COMPLETE METABOLIC PANEL WITH GFR
ALK PHOS: 101 U/L (ref 39–117)
ALT: 38 U/L — ABNORMAL HIGH (ref 0–35)
AST: 23 U/L (ref 0–37)
Albumin: 3.9 g/dL (ref 3.5–5.2)
BILIRUBIN TOTAL: 0.6 mg/dL (ref 0.2–1.2)
BUN: 10 mg/dL (ref 6–23)
CO2: 27 meq/L (ref 19–32)
Calcium: 9.1 mg/dL (ref 8.4–10.5)
Chloride: 98 mEq/L (ref 96–112)
Creat: 0.76 mg/dL (ref 0.50–1.10)
GFR, EST NON AFRICAN AMERICAN: 80 mL/min
GLUCOSE: 118 mg/dL — AB (ref 70–99)
Potassium: 5.2 mEq/L (ref 3.5–5.3)
SODIUM: 135 meq/L (ref 135–145)
TOTAL PROTEIN: 6.2 g/dL (ref 6.0–8.3)

## 2015-01-21 NOTE — Progress Notes (Signed)
Subjective:    Patient ID: Gina [name], female    DOB: [birth year], 69 y.o.   MRN: [id number]  HPI Patient was recently admitted to the hospital. I have copied relevant portions of the discharge summary and included them below for my reference:  Admit date: 01/12/2015 Discharge date: 01/14/2015   Principal Problem:  Sepsis Active Problems:  Essential hypertension  Diabetes type 2, controlled  Atrial fibrillation with RVR  Hypothyroidism  Chest pain     Medication List                    TAKE these medications       acetaminophen 325 MG tablet  Commonly known as: TYLENOL  Take 2 tablets (650 mg total) by mouth every 4 (four) hours as needed for headache or mild pain.     acidophilus Caps capsule  Take 1 capsule by mouth daily.     apixaban 5 MG Tabs tablet  Commonly known as: ELIQUIS  Take 1 tablet (5 mg total) by mouth 2 (two) times daily.     azithromycin 500 MG tablet  Commonly known as: ZITHROMAX  Take 1 tablet (500 mg total) by mouth daily.     Cod Liver Oil 1000 MG Caps  Take 2,000 mg by mouth 2 (two) times daily.     CoQ10 200 MG Caps  Take 200 mg by mouth daily.     cyanocobalamin 1000 MCG tablet  Take 1,000 mcg by mouth daily.     diltiazem 240 MG 24 hr capsule  Commonly known as: CARDIZEM CD  Take 1 capsule (240 mg total) by mouth daily.     flecainide 50 MG tablet  Commonly known as: TAMBOCOR  Take 1 tablet (50 mg total) by mouth every 12 (twelve) hours.     glucose 4 GM chewable tablet  Chew 16 g by mouth as needed for low blood sugar.     insulin lispro 100 UNIT/ML injection  Commonly known as: HUMALOG  Basal rate 0.9 7am-12, 0.8 Noon-7am. Max of 60 units SQ every day with pump mer MD DX CODE: E10.65     magnesium oxide 400 MG tablet  Commonly known as: MAG-OX  Take 400 mg by mouth daily.     metoprolol tartrate 25 MG tablet  Commonly known as:  LOPRESSOR  Take 0.5 tablets (12.5 mg total) by mouth 2 (two) times daily.     PROGESTERONE EX  Apply 1 application topically daily. Apply 4 clicks to chest daily. - compounded at [facility]     GLUCOSAMINE CHONDROITIN MSM PO  Take 1 tablet by mouth daily.     Red Yeast Rice 600 MG Tabs  Take 1,200 mg by mouth 2 (two) times daily.     thyroid 30 MG tablet  Commonly known as: ARMOUR  Take 30-60 mg by mouth daily before breakfast. Take 1 tablet (30 mg) on day 1, then take 2 tablets (60 mg) on day 2, then repeat     vitamin C 1000 MG tablet  Take 1,000-2,000 mg by mouth 2 (two) times daily. Take 1 tablet (1000 mg) twice daily on day 1, then 1 tablet (1000 mg) every morning and 2 tablets (2000 mg) at dinner on day 2, then repeat     Vitamin D-3 5000 UNITS Tabs  Take 5,000 Units by mouth daily.        Discharge Condition: Stable   Disposition: 01-Home or Self Care   Consults:  Cardiology  Significant Diagnostic Studies:  Imaging Results    Dg Chest 2 View  01/11/2015 CLINICAL DATA: Hypotension and dehydration. History of breast cancer. EXAM: CHEST 2 VIEW COMPARISON: 01/09/2015. FINDINGS: Worsening interstitial edema. Cardiomegaly. Congestive heart failure not excluded. No areas of focal consolidation. Small BILATERAL effusions. LEFT mastectomy. No osseous findings. IMPRESSION: Worsening aeration. BILATERAL interstitial prominence consistent with early congestive failure. Electronically Signed By: Rolla Flatten M.D. On: 01/11/2015 14:17   X-ray Chest Pa And Lateral  01/09/2015 CLINICAL DATA: Sepsis, breast cancer EXAM: CHEST 2 VIEW COMPARISON: 01/08/2015 FINDINGS: Cardiomediastinal silhouette is stable. Persistent mild interstitial prominence bilaterally. Mild interstitial edema cannot be excluded. Small left pleural effusion left basilar atelectasis or or infiltrate. No segmental infiltrates. Right lung is clear.  IMPRESSION: Mild interstitial prominence bilateral suspicious for mild interstitial edema. Small left pleural effusion with left basilar atelectasis or infiltrate. Right lung is clear. Electronically Signed By: Lahoma Crocker M.D. On: 01/09/2015 12:33   Ct Angio Chest Pe W/cm &/or Wo Cm  01/13/2015 CLINICAL DATA: Shortness of breath, mild chest pain. Elevated D-dimer. History of breast cancer 3 years prior. EXAM: CT ANGIOGRAPHY CHEST WITH CONTRAST TECHNIQUE: Multidetector CT imaging of the chest was performed using the standard protocol during bolus administration of intravenous contrast. Multiplanar CT image reconstructions and MIPs were obtained to evaluate the vascular anatomy. CONTRAST: 75mL OMNIPAQUE IOHEXOL 350 MG/ML SOLN COMPARISON: Chest radiographs from 01/08/2015 through 01/11/2015 FINDINGS: There are no filling defects within the pulmonary arteries to suggest pulmonary embolus. The thoracic aorta is normal in caliber with mild atherosclerosis. There is mild diffuse shotty mediastinal and to a lesser extent hilar adenopathy, for example, lower paratracheal lymph node measures 8 mm in short axis dimension. Prevascular lymph node measures 5 mm. There is mild cardiomegaly and contrast refluxing into the hepatic veins and IVC. Moderate bilateral pleural effusions with adjacent compressive atelectasis. No pericardial effusion. Smooth septal thickening and mild ground-glass opacity, in an upper lobe predominant distribution consistent with pulmonary edema. Linear reticular opacity in the anterior left upper lobe in a pattern consistent with post radiation change. Patient is post left lumpectomy. No discrete pulmonary nodule. Hepatic granuloma noted. There is otherwise no acute abnormality in the included upper abdomen. Surgical clips noted in the inferior right breast. There are no acute or suspicious osseous abnormalities. No discrete lytic or blastic osseous lesion. Review of the MIP images  confirms the above findings. IMPRESSION: 1. No pulmonary embolus. 2. Findings consistent with congestive heart failure. 3. Shotty mediastinal and bilateral hilar adenopathy. This is likely reactive related to congestive heart failure. 4. Post left breast lumpectomy with post radiation change in the left upper lobe. Electronically Signed By: Jeb Levering M.D.  On: 01/13/2015 02:10   Nm Myocar Multi W/spect W/wall Motion / Ef  01/13/2015 CLINICAL DATA: 69 year old female with elevated troponin, chest pain EXAM: MYOCARDIAL IMAGING WITH SPECT (REST AND PHARMACOLOGIC-STRESS) GATED LEFT VENTRICULAR WALL MOTION STUDY LEFT VENTRICULAR EJECTION FRACTION TECHNIQUE: Standard myocardial SPECT imaging was performed after resting intravenous injection of 10 mCi Tc-43m sestamibi. Subsequently, intravenous infusion of Lexiscan was performed under the supervision of the Cardiology staff. At peak effect of the drug, 30 mCi Tc-71m sestamibi was injected intravenously and standard myocardial SPECT imaging was performed. Quantitative gated imaging was also performed to evaluate left ventricular wall motion, and estimate left ventricular ejection fraction. COMPARISON: None. FINDINGS: Perfusion: There was homogeneous rate tracer uptake at both rest and stress. No decreased activity in the left ventricle on stress imaging to suggest reversible ischemia or  infarction. Wall Motion: Normal left ventricular wall motion. No left ventricular dilation. Left Ventricular Ejection Fraction: 71 % End diastolic volume 42 ml End systolic volume 12 ml IMPRESSION: 1. No reversible ischemia or infarction. 2. Normal left ventricular wall motion. 3. Left ventricular ejection fraction 71% 4. Low-risk stress test findings*. *2012 Appropriate Use Criteria for Coronary Revascularization Focused Update: J Am Coll Cardiol. 2012;59(9):857-881. http://[email or web address] Electronically Signed By:  Donato Schultz On: 01/13/2015 16:35   Dg Chest Port 1 View  01/08/2015 CLINICAL DATA: Dizziness and right arm tingling EXAM: PORTABLE CHEST - 1 VIEW COMPARISON: None. FINDINGS: There is a degree of underlying emphysematous change. There is generalized interstitial prominence without airspace consolidation. The heart is upper normal in size with mild pulmonary venous hypertension. No adenopathy. No bone lesions. There is calcification in the right carotid artery. IMPRESSION: Findings felt to represent a degree of congestive heart failure superimposed on emphysematous change. No airspace consolidation. Calcification right carotid artery. Electronically Signed By: Bretta Bang III M.D. On: 01/08/2015 08:41       Microbiology: Recent Results (from the past 240 hour(s))  Blood Culture (routine x 2) Status: None   Collection Time: 01/08/15 7:57 AM  Result Value Ref Range Status   Specimen Description BLOOD RIGHT HAND  Final   Special Requests BOTTLES DRAWN AEROBIC AND ANAEROBIC B 4CC R 3CC  Final   Culture   Final    NO GROWTH 5 DAYS Performed at [facility]    Report Status 01/14/2015 FINAL  Final  Blood Culture (routine x 2) Status: None   Collection Time: 01/08/15 8:21 AM  Result Value Ref Range Status   Specimen Description BLOOD RIGHT ARM  Final   Special Requests BOTTLES DRAWN AEROBIC ONLY 5CC  Final   Culture   Final    NO GROWTH 5 DAYS Performed at [facility]    Report Status 01/14/2015 FINAL  Final  Urine culture Status: None   Collection Time: 01/08/15 9:15 AM  Result Value Ref Range Status   Specimen Description URINE, CLEAN CATCH  Final   Special Requests NONE  Final   Colony Count NO GROWTH Performed at [facility]   Final   Culture NO GROWTH Performed at [facility]   Final   Report Status 01/09/2015 FINAL   Final  MRSA PCR Screening Status: None   Collection Time: 01/08/15 4:22 PM  Result Value Ref Range Status   MRSA by PCR NEGATIVE NEGATIVE Final    Comment:   The GeneXpert MRSA Assay (FDA approved for NASAL specimens only), is one component of a comprehensive MRSA colonization surveillance program. It is not intended to diagnose MRSA infection nor to guide or monitor treatment for MRSA infections.   Blood culture (routine x 2) Status: None (Preliminary result)   Collection Time: 01/12/15 6:20 PM  Result Value Ref Range Status   Specimen Description BLOOD ARM RIGHT  Final   Special Requests BOTTLES DRAWN AEROBIC AND ANAEROBIC 5CC  Final   Culture   Final     BLOOD CULTURE RECEIVED NO GROWTH TO DATE CULTURE WILL BE HELD FOR 5 DAYS BEFORE ISSUING A FINAL NEGATIVE REPORT Performed at [facility]    Report Status PENDING  Incomplete  Blood culture (routine x 2) Status: None (Preliminary result)   Collection Time: 01/12/15 6:25 PM  Result Value Ref Range Status   Specimen Description BLOOD RIGHT HAND  Final   Special Requests BOTTLES DRAWN AEROBIC ONLY 4CC  Final  Culture   Final     BLOOD CULTURE RECEIVED NO GROWTH TO DATE CULTURE WILL BE HELD FOR 5 DAYS BEFORE ISSUING A FINAL NEGATIVE REPORT Performed at [facility]    Report Status PENDING  Incomplete     Labs:  Lab Results Last 48 Hours    Results for orders placed or performed during the hospital encounter of 01/12/15 (from the past 48 hour(s))  CBC Status: Abnormal   Collection Time: 01/12/15 4:11 PM  Result Value Ref Range   WBC 8.5 4.0 - 10.5 K/uL   RBC 3.82 (L) 3.87 - 5.11 MIL/uL   Hemoglobin 11.5 (L) 12.0 - 15.0 g/dL    Comment: REPEATED TO VERIFY DELTA CHECK NOTED    HCT 34.2 (L) 36.0 - 46.0 %   MCV 89.5 78.0 - 100.0 fL   MCH 30.1 26.0 - 34.0  pg   MCHC 33.6 30.0 - 36.0 g/dL   RDW 13.5 11.5 - 15.5 %   Platelets 140 (L) 150 - 400 K/uL  Basic metabolic panel Status: Abnormal   Collection Time: 01/12/15 4:11 PM  Result Value Ref Range   Sodium 134 (L) 135 - 145 mmol/L   Potassium 3.5 3.5 - 5.1 mmol/L   Chloride 97 96 - 112 mmol/L   CO2 21 19 - 32 mmol/L   Glucose, Bld 84 70 - 99 mg/dL   BUN 5 (L) 6 - 23 mg/dL   Creatinine, Ser 0.73 0.50 - 1.10 mg/dL   Calcium 8.9 8.4 - 10.5 mg/dL   GFR calc non Af Amer 85 (L) >90 mL/min   GFR calc Af Amer >90 >90 mL/min    Comment: (NOTE) The eGFR has been calculated using the CKD EPI equation. This calculation has not been validated in all clinical situations. eGFR's persistently <90 mL/min signify possible Chronic Kidney Disease.    Anion gap 16 (H) 5 - 15  BNP (order ONLY if patient complains of dyspnea/SOB AND you have documented it for THIS visit) Status: Abnormal   Collection Time: 01/12/15 4:11 PM  Result Value Ref Range   B Natriuretic Peptide 345.8 (H) 0.0 - 100.0 pg/mL  I-stat troponin, ED (not at [facility]) Status: Abnormal   Collection Time: 01/12/15 4:43 PM  Result Value Ref Range   Troponin i, poc <0.20 (HH) 0.00 - 0.08 ng/mL    Comment: QUESTIONABLE RESULTS - CHARGE CREDITED REPEATED TO VERIFY CORRECTED ON 03/30 AT 1740: PREVIOUSLY REPORTED AS <0.20    Comment 3       Comment: Due to the release kinetics of cTnI, a negative result within the first hours of the onset of symptoms does not rule out myocardial infarction with certainty. If myocardial infarction is still suspected, repeat the test at appropriate intervals.   I-stat chem 8, ed Status: Abnormal   Collection Time: 01/12/15 4:44 PM  Result Value Ref Range   Sodium 136 135 - 145 mmol/L   Potassium 3.5 3.5 - 5.1 mmol/L   Chloride 96 96 - 112 mmol/L   BUN 7 6 - 23  mg/dL   Creatinine, Ser 0.50 0.50 - 1.10 mg/dL   Glucose, Bld 80 70 - 99 mg/dL   Calcium, Ion 1.09 (L) 1.13 - 1.30 mmol/L   TCO2 23 0 - 100 mmol/L   Hemoglobin 13.3 12.0 - 15.0 g/dL   HCT 39.0 36.0 - 46.0 %  D-dimer, quantitative Status: Abnormal   Collection Time: 01/12/15 4:50 PM  Result Value Ref Range   D-Dimer, Quant 1.66 (H)  0.00 - 0.48 ug/mL-FEU    Comment:   AT THE INHOUSE ESTABLISHED CUTOFF VALUE OF 0.48 ug/mL FEU, THIS ASSAY HAS BEEN DOCUMENTED IN THE LITERATURE TO HAVE A SENSITIVITY AND NEGATIVE PREDICTIVE VALUE OF AT LEAST 98 TO 99%. THE TEST RESULT SHOULD BE CORRELATED WITH AN ASSESSMENT OF THE CLINICAL PROBABILITY OF DVT / VTE.   I-stat troponin, ED Status: None   Collection Time: 01/12/15 5:29 PM  Result Value Ref Range   Troponin i, poc 0.04 0.00 - 0.08 ng/mL   Comment 3       Comment: Due to the release kinetics of cTnI, a negative result within the first hours of the onset of symptoms does not rule out myocardial infarction with certainty. If myocardial infarction is still suspected, repeat the test at appropriate intervals.   Urinalysis, Routine w reflex microscopic Status: Abnormal   Collection Time: 01/12/15 5:32 PM  Result Value Ref Range   Color, Urine YELLOW YELLOW   APPearance CLEAR CLEAR   Specific Gravity, Urine 1.009 1.005 - 1.030   pH 6.5 5.0 - 8.0   Glucose, UA NEGATIVE NEGATIVE mg/dL   Hgb urine dipstick NEGATIVE NEGATIVE   Bilirubin Urine NEGATIVE NEGATIVE   Ketones, ur 40 (A) NEGATIVE mg/dL   Protein, ur NEGATIVE NEGATIVE mg/dL   Urobilinogen, UA 0.2 0.0 - 1.0 mg/dL   Nitrite NEGATIVE NEGATIVE   Leukocytes, UA NEGATIVE NEGATIVE    Comment: MICROSCOPIC NOT DONE ON URINES WITH NEGATIVE PROTEIN, BLOOD, LEUKOCYTES, NITRITE, OR GLUCOSE <1000 mg/dL.  Blood culture (routine x 2) Status: None  (Preliminary result)   Collection Time: 01/12/15 6:20 PM  Result Value Ref Range   Specimen Description BLOOD ARM RIGHT    Special Requests BOTTLES DRAWN AEROBIC AND ANAEROBIC 5CC    Culture       BLOOD CULTURE RECEIVED NO GROWTH TO DATE CULTURE WILL BE HELD FOR 5 DAYS BEFORE ISSUING A FINAL NEGATIVE REPORT Performed at [facility]    Report Status PENDING   Blood culture (routine x 2) Status: None (Preliminary result)   Collection Time: 01/12/15 6:25 PM  Result Value Ref Range   Specimen Description BLOOD RIGHT HAND    Special Requests BOTTLES DRAWN AEROBIC ONLY 4CC    Culture       BLOOD CULTURE RECEIVED NO GROWTH TO DATE CULTURE WILL BE HELD FOR 5 DAYS BEFORE ISSUING A FINAL NEGATIVE REPORT Performed at [facility]    Report Status PENDING   Glucose, capillary Status: Abnormal   Collection Time: 01/12/15 9:36 PM  Result Value Ref Range   Glucose-Capillary 121 (H) 70 - 99 mg/dL  Glucose, capillary Status: Abnormal   Collection Time: 01/13/15 2:00 AM  Result Value Ref Range   Glucose-Capillary 119 (H) 70 - 99 mg/dL  Glucose, capillary Status: Abnormal   Collection Time: 01/13/15 4:00 AM  Result Value Ref Range   Glucose-Capillary 124 (H) 70 - 99 mg/dL  Glucose, capillary Status: Abnormal   Collection Time: 01/13/15 6:01 AM  Result Value Ref Range   Glucose-Capillary 141 (H) 70 - 99 mg/dL  Basic metabolic panel Status: Abnormal   Collection Time: 01/13/15 6:18 AM  Result Value Ref Range   Sodium 133 (L) 135 - 145 mmol/L   Potassium 3.6 3.5 - 5.1 mmol/L   Chloride 100 96 - 112 mmol/L   CO2 24 19 - 32 mmol/L   Glucose, Bld 150 (H) 70 - 99 mg/dL   BUN 6 6 - 23 mg/dL   Creatinine,  Ser 0.67 0.50 - 1.10 mg/dL   Calcium 8.1 (L) 8.4 - 10.5 mg/dL   GFR calc non Af Amer 87 (L) >90 mL/min     GFR calc Af Amer >90 >90 mL/min    Comment: (NOTE) The eGFR has been calculated using the CKD EPI equation. This calculation has not been validated in all clinical situations. eGFR's persistently <90 mL/min signify possible Chronic Kidney Disease.    Anion gap 9 5 - 15  Magnesium Status: None   Collection Time: 01/13/15 6:18 AM  Result Value Ref Range   Magnesium 1.7 1.5 - 2.5 mg/dL  CBC Status: Abnormal   Collection Time: 01/13/15 6:18 AM  Result Value Ref Range   WBC 6.6 4.0 - 10.5 K/uL   RBC 3.47 (L) 3.87 - 5.11 MIL/uL   Hemoglobin 10.4 (L) 12.0 - 15.0 g/dL    Comment: REPEATED TO VERIFY SPECIMEN CHECKED FOR CLOTS    HCT 30.9 (L) 36.0 - 46.0 %   MCV 89.0 78.0 - 100.0 fL   MCH 30.0 26.0 - 34.0 pg   MCHC 33.7 30.0 - 36.0 g/dL   RDW 80.8 79.5 - 27.1 %   Platelets 133 (L) 150 - 400 K/uL  Glucose, capillary Status: Abnormal   Collection Time: 01/13/15 7:30 AM  Result Value Ref Range   Glucose-Capillary 133 (H) 70 - 99 mg/dL  Influenza panel by PCR (type A & B, H1N1) Status: None   Collection Time: 01/13/15 11:17 AM  Result Value Ref Range   Influenza A By PCR NEGATIVE NEGATIVE   Influenza B By PCR NEGATIVE NEGATIVE   H1N1 flu by pcr NOT DETECTED NOT DETECTED    Comment:   The Xpert Flu assay (FDA approved for nasal aspirates or washes and nasopharyngeal swab specimens), is intended as an aid in the diagnosis of influenza and should not be used as a sole basis for treatment.   Glucose, capillary Status: Abnormal   Collection Time: 01/13/15 11:36 AM  Result Value Ref Range   Glucose-Capillary 164 (H) 70 - 99 mg/dL  Glucose, capillary Status: Abnormal   Collection Time: 01/13/15 1:39 PM  Result Value Ref Range   Glucose-Capillary 124 (H) 70 - 99 mg/dL  Glucose, capillary Status: Abnormal   Collection Time:  01/13/15 4:26 PM  Result Value Ref Range   Glucose-Capillary 184 (H) 70 - 99 mg/dL  Glucose, capillary Status: Abnormal   Collection Time: 01/13/15 8:12 PM  Result Value Ref Range   Glucose-Capillary 249 (H) 70 - 99 mg/dL  Glucose, capillary Status: Abnormal   Collection Time: 01/14/15 12:08 AM  Result Value Ref Range   Glucose-Capillary 126 (H) 70 - 99 mg/dL  Glucose, capillary Status: Abnormal   Collection Time: 01/14/15 4:02 AM  Result Value Ref Range   Glucose-Capillary 166 (H) 70 - 99 mg/dL  Comprehensive metabolic panel Status: Abnormal   Collection Time: 01/14/15 6:21 AM  Result Value Ref Range   Sodium 133 (L) 135 - 145 mmol/L   Potassium 3.7 3.5 - 5.1 mmol/L   Chloride 100 96 - 112 mmol/L   CO2 29 19 - 32 mmol/L   Glucose, Bld 160 (H) 70 - 99 mg/dL   BUN 7 6 - 23 mg/dL   Creatinine, Ser 5.12 0.50 - 1.10 mg/dL   Calcium 8.1 (L) 8.4 - 10.5 mg/dL   Total Protein 5.3 (L) 6.0 - 8.3 g/dL   Albumin 2.6 (L) 3.5 - 5.2 g/dL   AST 73 (H) 0 - 37 U/L  ALT 78 (H) 0 - 35 U/L   Alkaline Phosphatase 115 39 - 117 U/L   Total Bilirubin 0.4 0.3 - 1.2 mg/dL   GFR calc non Af Amer 88 (L) >90 mL/min   GFR calc Af Amer >90 >90 mL/min    Comment: (NOTE) The eGFR has been calculated using the CKD EPI equation. This calculation has not been validated in all clinical situations. eGFR's persistently <90 mL/min signify possible Chronic Kidney Disease.    Anion gap 4 (L) 5 - 15    Comment: REPEATED TO VERIFY  CBC Status: Abnormal   Collection Time: 01/14/15 6:21 AM  Result Value Ref Range   WBC 4.5 4.0 - 10.5 K/uL   RBC 3.28 (L) 3.87 - 5.11 MIL/uL   Hemoglobin 9.7 (L) 12.0 - 15.0 g/dL   HCT 29.3 (L) 36.0 - 46.0 %   MCV 89.3 78.0 - 100.0 fL   MCH 29.6 26.0 - 34.0 pg   MCHC 33.1 30.0 - 36.0 g/dL   RDW 13.7  11.5 - 15.5 %   Platelets 134 (L) 150 - 400 K/uL  Glucose, capillary Status: Abnormal   Collection Time: 01/14/15 7:58 AM  Result Value Ref Range   Glucose-Capillary 125 (H) 70 - 99 mg/dL       HPI :Ms. Cales is a 69 y/o F with recently diagnosed AF RVR (discharged this AM) with subsequent diastolic CHF, HTN, HLD, DM type 1 on insulin pump, hypothyroidism, breast CA who presents back to [facility] with palpitations. She was recently admitted 01/08/15 with "severe sepsis" with hypotension into the 60s of unclear cause, HR 30s, treated with IV fluids and antibiotic therapy (blood and urine culture negative). She was discharged 01/10/15.2D Echo 3/28: EF 55-60%, no RWMA, trace MR and mild TR, PASP 42mmHg. She was at her PCP's office for followup 01/11/15 when she had a episode of dizziness and was found to be in atrial fibrillation with a rapid ventricular response. She was admitted to [facility] for further evaluation. She spontaneously converted to NSR on IV diltiazem and started started on low dose BB. Her ARB changed to Lopressor and Eliquis added. She has had an elevated troponin felt to be secondary to demand ischemia, possible plan for outpatient nuclear stress test. Her CXR suggested vascular congestion and she received one dose of po Lasix. She was discharged this AM in NSR. She has had 2 doses of Eliquis so far.  She went home and was feeling OK but this afternoon developed recurrence of tachypalpitations. She also reports feeling fatigued and has a headache. Temp 102.6, confirmed with recheck. She feels cold but denies overt rigors, malaise or nasal symptoms. On telemetry she has gone in and out of atrial flutter. Labs also notable for Hgb 11.5, Plt 140 (although Hgb earlier this AM was ?9.5 with platelets 113), Istat chem8 pending. BMET earlier this AM showed Na 136, K 3.5, BUN 7, Cr 0.66. TSH normal on 3/27. Note Hgb 12.8 in 08/2014, was 10.4 on last admission 01/08/15.  Denies any unusual bleeding. She does endorse some chest discomfort upon inspiration. Had 9 hour flight to [place] on February 23. No recent LEE, LE erythema, pain, dyspnea or recent sick contacts.   HOSPITAL COURSE: Atrial fibrillation with rapid ventricular response Currently normal sinus rhythm Continue Cardizem, flecainide, Eliquis Metoprolol resumed by cardiology TSH within normal limits Maintaining sinus rhythm Follow-up in the A. fib clinic  Chest pain Elevated troponin, peak 0.22 Patient had a 2-D echo on 3/20 and  that was within normal limits without any wall motion abnormalities Cardiology recommended a stress test Negative Lexiscan Myoview.  Seen by cardiology prior to discharge  Fever Negative influenza PCR and respiratory panel is still pending Previous workup on 3/26 admission including blood culture, urine culture, influenza screen was negative CTA does not show any obvious pneumonia Continue azithromycin for suspected acute bronchitis  Hypothyroidism Continue armour  Diabetes Patient on an insulin pump We will consult diabetes coordinator  Abnormal d-dimer CT A- negative Venous Doppler negative  History of breast cancer status post left breast lumpectomy Mediastinal and bilateral hilar adenopathy likely reactive secondary to CHF          Patient is here today for follow-up. Overall she is doing well. Her heart rate is well controlled in the 60s. Today she is in normal sinus rhythm. She has been afebrile ever since her discharge from the hospital. Her blood pressure stable today 118/62. She denies any chest pain shortness of breath dyspnea on exertion or syncope. She denies any palpitations or presyncope. She denies any lightheadedness. She does complain of some fatigue. However her apparent illness has resolved. Hospital workup was significant for anemia with no obvious cause. Hemoglobin was 9.7. Past Medical History  Diagnosis Date  . Ovarian cyst   .  Hypertension   . Insulin pump in place   . 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 Anxiety     new dx  . Hyperlipidemia   . S/P radiation therapy 04/10/12- 05/26/2012    left Breast and Axilla / 50 gy / 25 Fractions with Left Breast Boost / 10 Gy / 5 Fractions  . Use of letrozole (Femara) 06/13/12  . Diabetes mellitus type 1     Insulin pump  . Hypothyroidism   . Diastolic CHF     a. 11/9516 - due to AF RVR.  Marland Kitchen Atrial fibrillation    Past Surgical History  Procedure Laterality Date  . Tonsillectomy    . Ovarian cyst surgery    . Colonoscopy      1 polyp   . 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 Appendectomy     Current Outpatient Prescriptions on File Prior to Visit  Medication Sig Dispense Refill  . acetaminophen (TYLENOL) 325 MG tablet Take 2 tablets (650 mg total) by mouth every 4 (four) hours as needed for headache or mild pain.    Marland Kitchen acidophilus (RISAQUAD) CAPS capsule Take 1 capsule by mouth daily. 10 capsule 0  . apixaban (ELIQUIS) 5 MG TABS tablet Take 1 tablet (5 mg total) by mouth 2 (two) times daily. 60 tablet 11  . Ascorbic Acid (VITAMIN C) 1000 MG tablet Take 1,000-2,000 mg by mouth 2 (two) times daily. Take 1 tablet (1000 mg) twice daily on day 1, then 1 tablet (1000 mg) every morning and 2 tablets (2000 mg) at dinner on day 2, then repeat    . Cholecalciferol (VITAMIN D-3) 5000 UNITS TABS Take 5,000 Units by mouth daily.     Marland Kitchen Cod Liver Oil 1000 MG CAPS Take 2,000 mg by mouth 2 (two) times daily.     . cyanocobalamin 1000 MCG tablet Take 1,000 mcg by mouth daily.     Marland Kitchen diltiazem (CARDIZEM CD) 240  MG 24 hr capsule Take 1 capsule (240 mg total) by mouth daily. 30 capsule 2  . flecainide (TAMBOCOR) 50  MG tablet Take 1 tablet (50 mg total) by mouth every 12 (twelve) hours. 60 tablet 1  . glucose 4 GM chewable tablet Chew 16 g by mouth as needed for low blood sugar.     . insulin lispro (HUMALOG) 100 UNIT/ML injection Basal rate 0.9 7am-12, 0.8 Noon-7am. Max of 60 units SQ every day with pump mer MD DX CODE: E10.65 (Patient taking differently: Insulin pump - Basal rate from 12 am-4:59 am at 0.45 units/hr, 5 am-6:59 am at 0.9 units/hr, 7 am-9:59 am at 1 unit/hr, 10 am-11:59 am at 1.1 units/hr, 12 pm-3:59 pm at 0.9 units/hr, 4 pm-8:59 at 0.8 units/hr, 9 pm-11:59 pm at 0.75 units/hr. Max of 100 units SQ every day; max of 50 units SQ in a 2 hr time frame with pump mer MD DX CODE: E10.65) 20 mL 6  . magnesium oxide (MAG-OX) 400 MG tablet Take 400 mg by mouth daily.    . metoprolol tartrate (LOPRESSOR) 25 MG tablet Take 0.5 tablets (12.5 mg total) by mouth 2 (two) times daily. 30 tablet 11  . Misc Natural Products (PROGESTERONE EX) Apply 1 application topically daily. Apply 4 clicks to chest daily. - compounded at [place]    . thyroid (ARMOUR) 30 MG tablet Take 30-60 mg by mouth daily before breakfast. Take 1 tablet (30 mg) on day 1, then take 2 tablets (60 mg) on day 2, then repeat    . Coenzyme Q10 (COQ10) 200 MG CAPS Take 200 mg by mouth daily.    . Misc Natural Products (GLUCOSAMINE CHONDROITIN MSM PO) Take 1 tablet by mouth daily.    . Red Yeast Rice 600 MG TABS Take 1,200 mg by mouth 2 (two) times daily.      Current Facility-Administered Medications on File Prior to Visit  Medication Dose Route Frequency Provider Last Rate Last Dose  . topical emolient (BIAFINE) emulsion   Topical Daily Eppie Gibson, MD       Allergies  Allergen Reactions  . Statins Other (See Comments)    Leg pain   History   Social History  . Marital Status: Married    Spouse Name: N/A  . Number of Children: N/A  . Years of Education: N/A   Occupational History  . Not on file.   Social History Main  Topics  . Smoking status: Never Smoker   . Smokeless tobacco: Not on file  . Alcohol Use: No     Comment: menarche age 59,menopause age 82,hrt short time oral,then progesterone cream ,stopped g3,p3,1st age 73  . Drug Use: No  . Sexual Activity: Yes   Other Topics Concern  . Not on file   Social History Narrative    Review of Systems  All other systems reviewed and are negative.      Objective:   Physical Exam  Constitutional: She appears well-developed and well-nourished.  Neck: Neck supple. No JVD present. No thyromegaly present.  Cardiovascular: Normal rate, regular rhythm, normal heart sounds and intact distal pulses.   No murmur heard. Pulmonary/Chest: Effort normal and breath sounds normal. No respiratory distress. She has no wheezes. She has no rales. She exhibits no tenderness.  Abdominal: Soft. Bowel sounds are normal. She exhibits no distension and no mass. There is no tenderness. There is no rebound and no guarding.  Musculoskeletal: She exhibits no edema.  Lymphadenopathy:  She has no cervical adenopathy.  Vitals reviewed.         Assessment & Plan:  Other iron deficiency anemias - Plan: Fecal occult blood, imunochemical, Fecal occult blood, imunochemical, Fecal occult blood, imunochemical  Hospital discharge follow-up - Plan: COMPLETE METABOLIC PANEL WITH GFR  Paroxysmal atrial fibrillation  Other specified hypotension  Patient apparently had sepsis. Her entire workup was negative for source of infection. It was most likely some type of gastroenteritis. However I believe the strain of this infection likely triggered the patient to develop atrial fibrillation with RVR. Presently her dehydration and sepsis has resolved. She is currently in normal sinus rhythm with a stable blood pressure and a stable heart rate. The patient is requesting a schedule her to follow-up with cardiology which I'll gladly do. She is appropriately anticoagulated on Eliquis. However  there is still one remaining loose end. She was found to be anemic at 9.7. I will check iron studies, B12 studies, and stool studies 3 to evaluate for guaiac positive stool. Further workup will depend upon the results of these test.

## 2015-01-24 LAB — VITAMIN B12

## 2015-01-24 LAB — IRON: IRON: 83 ug/dL (ref 42–145)

## 2015-01-25 LAB — FECAL OCCULT BLOOD, IMMUNOCHEMICAL
FECAL OCCULT BLOOD: NEGATIVE
FECAL OCCULT BLOOD: NEGATIVE
Fecal Occult Blood: NEGATIVE

## 2015-01-26 ENCOUNTER — Ambulatory Visit (HOSPITAL_COMMUNITY)
Admission: RE | Admit: 2015-01-26 | Discharge: 2015-01-26 | Disposition: A | Discharge status: Home / Self Care | Payer: Medicare Other | Source: Ambulatory Visit | Attending: Nurse Practitioner | Admitting: Nurse Practitioner

## 2015-01-26 ENCOUNTER — Other Ambulatory Visit: Payer: Self-pay

## 2015-01-26 DIAGNOSIS — R0789 Other chest pain: Secondary | ICD-10-CM | POA: Diagnosis not present

## 2015-01-26 DIAGNOSIS — I480 Paroxysmal atrial fibrillation: Secondary | ICD-10-CM

## 2015-01-28 ENCOUNTER — Other Ambulatory Visit: Payer: Self-pay | Admitting: *Deleted

## 2015-01-28 DIAGNOSIS — D539 Nutritional anemia, unspecified: Secondary | ICD-10-CM

## 2015-01-28 MED ORDER — FERROUS SULFATE 325 (65 FE) MG PO TABS: 325.0000 mg | ORAL_TABLET | Freq: Two times a day (BID) | ORAL | Status: DC

## 2015-02-04 ENCOUNTER — Encounter: Payer: Medicare Other | Admitting: Cardiology

## 2015-02-10 ENCOUNTER — Other Ambulatory Visit: Payer: Self-pay | Admitting: Internal Medicine

## 2015-02-25 ENCOUNTER — Telehealth: Payer: Self-pay | Admitting: *Deleted

## 2015-02-25 ENCOUNTER — Ambulatory Visit
Admission: RE | Admit: 2015-02-25 | Discharge: 2015-02-25 | Disposition: A | Discharge status: Home / Self Care | Payer: Medicare Other | Source: Ambulatory Visit | Attending: Radiation Oncology | Admitting: Radiation Oncology

## 2015-02-25 NOTE — Telephone Encounter (Signed)
Called patient to inform her of Dr.Squire's decision about a thermogram, no answer will call later

## 2015-02-25 NOTE — Telephone Encounter (Signed)
CALLED PATIENT TO INQUIRE ABOUT MISSING MAMMOGRAM, SPOKE WITH PATIENT AND SHE DOESN'T WANT TO HAVE A MAMMOGRAM ANY MORE DUE TO RADIATION EXPOSURE, CANCELLED FU WITH DR. Isidore Moos DUE TO MISSING HER MAMMOGRAM, INFORMED DR. SQUIRE

## 2015-02-28 ENCOUNTER — Other Ambulatory Visit: Payer: Medicare Other

## 2015-02-28 DIAGNOSIS — D539 Nutritional anemia, unspecified: Secondary | ICD-10-CM

## 2015-02-28 LAB — CBC WITH DIFFERENTIAL/PLATELET
BASOS ABS: 0.1 10*3/uL (ref 0.0–0.1)
Basophils Relative: 2 % — ABNORMAL HIGH (ref 0–1)
Eosinophils Absolute: 0.1 10*3/uL (ref 0.0–0.7)
Eosinophils Relative: 3 % (ref 0–5)
HCT: 42.3 % (ref 36.0–46.0)
HEMOGLOBIN: 13.6 g/dL (ref 12.0–15.0)
Lymphocytes Relative: 52 % — ABNORMAL HIGH (ref 12–46)
Lymphs Abs: 2 10*3/uL (ref 0.7–4.0)
MCH: 29 pg (ref 26.0–34.0)
MCHC: 32.2 g/dL (ref 30.0–36.0)
MCV: 90.2 fL (ref 78.0–100.0)
MPV: 10.4 fL (ref 8.6–12.4)
Monocytes Absolute: 0.4 10*3/uL (ref 0.1–1.0)
Monocytes Relative: 11 % (ref 3–12)
NEUTROS ABS: 1.2 10*3/uL — AB (ref 1.7–7.7)
NEUTROS PCT: 32 % — AB (ref 43–77)
PLATELETS: 219 10*3/uL (ref 150–400)
RBC: 4.69 MIL/uL (ref 3.87–5.11)
RDW: 14 % (ref 11.5–15.5)
WBC: 3.9 10*3/uL — ABNORMAL LOW (ref 4.0–10.5)

## 2015-03-04 ENCOUNTER — Ambulatory Visit (INDEPENDENT_AMBULATORY_CARE_PROVIDER_SITE_OTHER): Payer: Medicare Other | Admitting: Internal Medicine

## 2015-03-04 ENCOUNTER — Encounter: Payer: Self-pay | Admitting: Internal Medicine

## 2015-03-04 VITALS — BP 150/70 | HR 52 | Ht 64.0 in | Wt 146.1 lb

## 2015-03-04 DIAGNOSIS — I10 Essential (primary) hypertension: Secondary | ICD-10-CM | POA: Diagnosis not present

## 2015-03-04 DIAGNOSIS — E109 Type 1 diabetes mellitus without complications: Secondary | ICD-10-CM | POA: Diagnosis not present

## 2015-03-04 DIAGNOSIS — I480 Paroxysmal atrial fibrillation: Secondary | ICD-10-CM

## 2015-03-04 MED ORDER — FLECAINIDE ACETATE 50 MG PO TABS: 50.0000 mg | ORAL_TABLET | Freq: Two times a day (BID) | ORAL | Status: DC

## 2015-03-04 MED ORDER — DILTIAZEM HCL ER COATED BEADS 240 MG PO CP24: 240.0000 mg | ORAL_CAPSULE | Freq: Every day | ORAL | Status: DC

## 2015-03-04 NOTE — Patient Instructions (Signed)
Your physician has recommended you make the following change in your medication: STOP METOPROLOL TARTRATE   Your physician recommends that you schedule a follow-up appointment in: 4-6 [place] DR. HILTY

## 2015-03-04 NOTE — Progress Notes (Signed)
OFFICE NOTE  Chief Complaint:  Hospital follow-up, fatigue  Primary Care Physician: Odette Fraction, MD  HPI:  Gina [name] this pleasant 69 year old female who is following up with me from her recent hospitalization. Unfortunately she was admitted multiple times for symptoms and ultimately found to have A. fib with RVR. Did have problems with hypotension. Her past medical history significant for type 1 diabetes on insulin pump, hypertension, dyslipidemia, hypothyroidism, who presented to the ER with complaints of dizziness, lightheadedness and syncope. She was hypotensive on admission with blood pressure 62/48 that seem to improve with IV fluids. She was also bradycardic. In addition she was hypothermic and placed on sepsis protocol. She received broad-spectrum antibiotics. There is no initial complaint of chest pain, however she did have nausea, dizziness and diarrhea. EKG shows nonspecific changes and poor R-wave progression. Initial troponin was negative however subsequent troponin was elevated 0.61. An echocardiogram was ordered but not yet obtained. I saw her in consultation. She ultimately had an echocardiogram which showed a normal EF and no wall motion abnormalities. A nuclear stress test was performed which was an exercise stress test and was negative for ischemia. She was seen by Dr. Rayann Heman and he recommended flecainide 50 mg twice a day in addition to diltiazem long-acting. She also was started on metoprolol. After discharge she followed up in the A. fib clinic and saw [name], NP. No changes were made in her medicines and she was scheduled to see Dr. Rayann Heman in 6-8 weeks (although I do not see that appointment was made). Her main complaint today is ongoing fatigue. She says she just has not quite gotten her energy back. EKG today shows sinus bradycardia at 52. She denies any recurrence of A. Fib.  PMHx:  Past Medical History  Diagnosis Date  . Ovarian cyst   .  Hypertension   . Insulin pump in place   . 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 Anxiety     new dx  . Hyperlipidemia   . S/P radiation therapy 04/10/12- 05/26/2012    left Breast and Axilla / 50 gy / 25 Fractions with Left Breast Boost / 10 Gy / 5 Fractions  . Use of letrozole (Femara) 06/13/12  . Diabetes mellitus type 1     Insulin pump  . Hypothyroidism   . Diastolic CHF     a. 10/4479 - due to AF RVR.  Marland Kitchen Atrial fibrillation     Past Surgical History  Procedure Laterality Date  . Tonsillectomy    . Ovarian cyst surgery    . Colonoscopy      1 polyp   . 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 Appendectomy      FAMHx:  Family History  Problem Relation Age of Onset  . Breast cancer Mother 15  . Prostate cancer Maternal Uncle     died in his 13s    SOCHx:   reports that she has never smoked. She does not have any smokeless tobacco history on file. She reports that she does not drink alcohol or use illicit drugs.  ALLERGIES:  Allergies  Allergen Reactions  . Statins Other (See Comments)    Leg pain    ROS: A comprehensive review of systems  was negative except for: Constitutional: positive for fatigue  HOME MEDS: Current Outpatient Prescriptions  Medication Sig Dispense Refill  . acetaminophen (TYLENOL) 325 MG tablet Take 2 tablets (650 mg total) by mouth every 4 (four) hours as needed for headache or mild pain.    Marland Kitchen acidophilus (RISAQUAD) CAPS capsule Take 1 capsule by mouth daily. 10 capsule 0  . apixaban (ELIQUIS) 5 MG TABS tablet Take 1 tablet (5 mg total) by mouth 2 (two) times daily. 60 tablet 11  . Ascorbic Acid (VITAMIN C) 1000 MG tablet Take 1,000-2,000 mg by mouth 2 (two) times daily.  Take 1 tablet (1000 mg) twice daily on day 1, then 1 tablet (1000 mg) every morning and 2 tablets (2000 mg) at dinner on day 2, then repeat    . Cholecalciferol (VITAMIN D-3) 5000 UNITS TABS Take 5,000 Units by mouth daily.     Marland Kitchen Cod Liver Oil 1000 MG CAPS Take 2,000 mg by mouth 2 (two) times daily.     . Coenzyme Q10 (COQ10) 200 MG CAPS Take 200 mg by mouth daily.    . cyanocobalamin 1000 MCG tablet Take 1,000 mcg by mouth daily.     Marland Kitchen diltiazem (CARDIZEM CD) 240 MG 24 hr capsule Take 1 capsule (240 mg total) by mouth daily. 30 capsule 2  . ferrous sulfate (FERROUSUL) 325 (65 FE) MG tablet Take 1 tablet (325 mg total) by mouth 2 (two) times daily with a meal.  3  . flecainide (TAMBOCOR) 50 MG tablet Take 1 tablet (50 mg total) by mouth every 12 (twelve) hours. 60 tablet 1  . glucose 4 GM chewable tablet Chew 16 g by mouth as needed for low blood sugar.     . insulin lispro (HUMALOG) 100 UNIT/ML injection Basal rate 0.9 7am-12, 0.8 Noon-7am. Max of 60 units SQ every day with pump mer MD DX CODE: E10.65 (Patient taking differently: Insulin pump - Basal rate from 12 am-4:59 am at 0.45 units/hr, 5 am-6:59 am at 0.9 units/hr, 7 am-9:59 am at 1 unit/hr, 10 am-11:59 am at 1.1 units/hr, 12 pm-3:59 pm at 0.9 units/hr, 4 pm-8:59 at 0.8 units/hr, 9 pm-11:59 pm at 0.75 units/hr. Max of 100 units SQ every day; max of 50 units SQ in a 2 hr time frame with pump mer MD DX CODE: E10.65) 20 mL 6  . magnesium oxide (MAG-OX) 400 MG tablet Take 400 mg by mouth daily.    . Misc Natural Products (GLUCOSAMINE CHONDROITIN MSM PO) Take 1 tablet by mouth daily.    . Misc Natural Products (PROGESTERONE EX) Apply 1 application topically daily. Apply 4 clicks to chest daily. - compounded at [place]    . Red Yeast Rice 600 MG TABS Take 1,200 mg by mouth 2 (two) times daily.     Marland Kitchen thyroid (ARMOUR) 30 MG tablet Take 30-60 mg by mouth daily before breakfast. Take 1 tablet (30 mg) on day 1, then take 2 tablets (60 mg) on  day 2, then repeat     No current facility-administered medications for this visit.   Facility-Administered Medications Ordered in Other Visits  Medication Dose Route Frequency Provider Last Rate Last Dose  . topical emolient (BIAFINE) emulsion   Topical Daily Eppie Gibson, MD        LABS/IMAGING: No results found for this or any previous visit (from the past 48 hour(s)). No results found.  WEIGHTS: Wt Readings from Last 3 Encounters:  03/04/15 146 lb 1.6 oz (66.271 kg)  01/21/15  144 lb (65.318 kg)  01/17/15 147 lb 12.8 oz (67.042 kg)    VITALS: BP 150/70 mmHg  Pulse 52  Ht $R'5\' 4"'iz$  (1.626 m)  Wt 146 lb 1.6 oz (66.271 kg)  BMI 25.07 kg/m2  EXAM: Deferred  EKG: Sinus bradycardia 52  ASSESSMENT: 1. Paroxysmal atrial fibrillation -CHADSVASC score 3 2. Elevated troponin likely secondary to sepsis with a negative nuclear stress test 3. Normal systolic and diastolic function on echo with normal left atrial size  PLAN: 1.   Mrs. Blau presents for follow-up at request of her primary care provider. She was seen in early April in the [facility] clinic and was felt to be doing well. Unfortunately she's had persistent fatigue. Heart rate is well-controlled in the low 50s. I suspect that the addition of new medications are contributing to her fatigue. At this point it seems to be unlikely related to A. fib. I recommend we discontinue her low-dose metoprolol to allow higher heart rate and see if this improves her fatigue. She was quite upset today that she had seen multiple providers in the hospital and was scheduled to see multiple cardiologists at follow-up. We discussed options including continued to have me manage her atrial fibrillation, having her A. fib followed in the A. fib clinic or following with me and Dr. Rayann Heman. She would prefer to follow-up with me and if her A. fib returns or she breaks through her flecainide or fails antiarrhythmic therapy, the plan would be to refer her to Dr.  Rayann Heman for evaluation of possible ablation.  Plan to see her back in 4-6 weeks.  Pixie Casino, MD, [facility] Attending Cardiologist [place] 03/04/2015, 11:51 AM

## 2015-03-05 ENCOUNTER — Telehealth: Payer: Self-pay | Admitting: Physician Assistant

## 2015-03-05 NOTE — Telephone Encounter (Signed)
Pt called Saturday answering service. Saw Dr. Debara Pickett yesterday - metoprolol stopped due fatigue.  This AM around 5:30 she noticed her HR was up to 130. She took all of her morning medicines and since that time HR fluctuates in the 50s to 130s. She doesn't feel bad, just aware that her heart rate may be jumping around. She took her flecainide, diltiazem, and resumed metoprolol this AM. She sounds frustrated by recurrence of AF. Cannot give extra AVN blocking agents because sinus rate tends to run 50s. No CP, dizziness, SOB.  Advised 2 options - 1) proceed to ER for evaluation and we can obtain EP input on changing flecainide to something else like Tikosyn, sotalol, etc. 2) Give her AM meds time to kick in and possibly restore continued NSR since she has otherwise been doing well on her regimen lately.  As she is not acutely ill she has elected the latter which I think is reasonable. She will proceed to ER if sx worsen or do not improve. Otherwise I told her I would forward to Dr. Debara Pickett for further input - probably needs to be plugged back into AF clinic.  Gina Buehring PA-C

## 2015-03-06 ENCOUNTER — Telehealth: Payer: Self-pay | Admitting: Physician Assistant

## 2015-03-06 ENCOUNTER — Encounter (HOSPITAL_COMMUNITY): Payer: Self-pay | Admitting: Physical Medicine and Rehabilitation

## 2015-03-06 ENCOUNTER — Emergency Department (HOSPITAL_COMMUNITY)
Admission: EM | Admit: 2015-03-06 | Discharge: 2015-03-06 | Disposition: A | Discharge status: Home / Self Care | Payer: Medicare Other | Attending: Emergency Medicine | Admitting: Emergency Medicine

## 2015-03-06 ENCOUNTER — Emergency Department (HOSPITAL_COMMUNITY): Payer: Medicare Other

## 2015-03-06 DIAGNOSIS — Z7902 Long term (current) use of antithrombotics/antiplatelets: Secondary | ICD-10-CM | POA: Insufficient documentation

## 2015-03-06 DIAGNOSIS — E109 Type 1 diabetes mellitus without complications: Secondary | ICD-10-CM | POA: Diagnosis not present

## 2015-03-06 DIAGNOSIS — E039 Hypothyroidism, unspecified: Secondary | ICD-10-CM | POA: Insufficient documentation

## 2015-03-06 DIAGNOSIS — I482 Chronic atrial fibrillation, unspecified: Secondary | ICD-10-CM

## 2015-03-06 DIAGNOSIS — Z8742 Personal history of other diseases of the female genital tract: Secondary | ICD-10-CM | POA: Insufficient documentation

## 2015-03-06 DIAGNOSIS — Z8659 Personal history of other mental and behavioral disorders: Secondary | ICD-10-CM | POA: Insufficient documentation

## 2015-03-06 DIAGNOSIS — Z79899 Other long term (current) drug therapy: Secondary | ICD-10-CM | POA: Diagnosis not present

## 2015-03-06 DIAGNOSIS — I5030 Unspecified diastolic (congestive) heart failure: Secondary | ICD-10-CM | POA: Insufficient documentation

## 2015-03-06 DIAGNOSIS — Z794 Long term (current) use of insulin: Secondary | ICD-10-CM | POA: Insufficient documentation

## 2015-03-06 DIAGNOSIS — Z5189 Encounter for other specified aftercare: Secondary | ICD-10-CM | POA: Diagnosis not present

## 2015-03-06 DIAGNOSIS — R002 Palpitations: Secondary | ICD-10-CM | POA: Diagnosis present

## 2015-03-06 DIAGNOSIS — Z9641 Presence of insulin pump (external) (internal): Secondary | ICD-10-CM | POA: Insufficient documentation

## 2015-03-06 DIAGNOSIS — Z853 Personal history of malignant neoplasm of breast: Secondary | ICD-10-CM | POA: Insufficient documentation

## 2015-03-06 DIAGNOSIS — Z79811 Long term (current) use of aromatase inhibitors: Secondary | ICD-10-CM | POA: Diagnosis not present

## 2015-03-06 DIAGNOSIS — I10 Essential (primary) hypertension: Secondary | ICD-10-CM | POA: Insufficient documentation

## 2015-03-06 LAB — CBC WITH DIFFERENTIAL/PLATELET
BASOS PCT: 0 % (ref 0–1)
Basophils Absolute: 0 10*3/uL (ref 0.0–0.1)
EOS PCT: 0 % (ref 0–5)
Eosinophils Absolute: 0 10*3/uL (ref 0.0–0.7)
HEMATOCRIT: 35.2 % — AB (ref 36.0–46.0)
HEMOGLOBIN: 11.8 g/dL — AB (ref 12.0–15.0)
Lymphocytes Relative: 16 % (ref 12–46)
Lymphs Abs: 1.3 10*3/uL (ref 0.7–4.0)
MCH: 29.2 pg (ref 26.0–34.0)
MCHC: 33.5 g/dL (ref 30.0–36.0)
MCV: 87.1 fL (ref 78.0–100.0)
MONO ABS: 1 10*3/uL (ref 0.1–1.0)
Monocytes Relative: 12 % (ref 3–12)
NEUTROS PCT: 72 % (ref 43–77)
Neutro Abs: 6 10*3/uL (ref 1.7–7.7)
Platelets: 160 10*3/uL (ref 150–400)
RBC: 4.04 MIL/uL (ref 3.87–5.11)
RDW: 13.5 % (ref 11.5–15.5)
WBC: 8.3 10*3/uL (ref 4.0–10.5)

## 2015-03-06 LAB — COMPREHENSIVE METABOLIC PANEL
ALBUMIN: 3.3 g/dL — AB (ref 3.5–5.0)
ALK PHOS: 58 U/L (ref 38–126)
ALT: 23 U/L (ref 14–54)
AST: 23 U/L (ref 15–41)
Anion gap: 11 (ref 5–15)
BILIRUBIN TOTAL: 1.5 mg/dL — AB (ref 0.3–1.2)
BUN: 12 mg/dL (ref 6–20)
CO2: 25 mmol/L (ref 22–32)
CREATININE: 0.79 mg/dL (ref 0.44–1.00)
Calcium: 8.5 mg/dL — ABNORMAL LOW (ref 8.9–10.3)
Chloride: 91 mmol/L — ABNORMAL LOW (ref 101–111)
GFR calc Af Amer: >60 mL/min (ref >60)
Glucose, Bld: 192 mg/dL — ABNORMAL HIGH (ref 65–99)
Potassium: 4.1 mmol/L (ref 3.5–5.1)
Sodium: 127 mmol/L — ABNORMAL LOW (ref 135–145)
Total Protein: 6.4 g/dL — ABNORMAL LOW (ref 6.5–8.1)

## 2015-03-06 LAB — I-STAT TROPONIN, ED: Troponin i, poc: 0.02 ng/mL (ref 0.00–0.08)

## 2015-03-06 LAB — D-DIMER, QUANTITATIVE (NOT AT ARMC): D-Dimer, Quant: 1.37 ug/mL-FEU — ABNORMAL HIGH (ref 0.00–0.48)

## 2015-03-06 NOTE — ED Provider Notes (Signed)
CSN: [id number]     Arrival date & time 03/06/15  1010 History   First MD Initiated Contact with Patient 03/06/15 1018     Chief Complaint  Patient presents with  . Palpitations  . Chest Pain     (Consider location/radiation/quality/duration/timing/severity/associated sxs/prior Treatment) HPI Patient presents to the emergency department with palpitations that happened each morning over the last 2 days.  The patient states that she has history of a fibrillation and noticed that when she woke in the morning.  Her heart rate was in the 130s.  The patient states that she would take her medications and her heart rate would stabilized him back to a normal rate.  The patient states that she talked to her cardiologist and they advised her to come to the emergency department for further evaluation.  She saw her cardiologist Friday who was going to stop her metoprolol that she did not stop taking it.  Patient denies chest pain, weakness, dizziness, headache, blurred vision, back pain, neck pain, fever, runny nose, sore throat, lightheadedness, abdominal pain, nausea, vomiting, diarrhea, or syncope.  The patient states that she has had some cough over the last 3 days.  She states that when she coughs, she did notice some chest discomfort Past Medical History  Diagnosis Date  . Ovarian cyst   . Hypertension   . Insulin pump in place   . 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 Anxiety     new dx  . Hyperlipidemia   . S/P radiation therapy 04/10/12- 05/26/2012    left Breast and Axilla / 50 gy / 25 Fractions with Left Breast Boost / 10 Gy / 5 Fractions  . Use of letrozole (Femara) 06/13/12  . Diabetes mellitus type 1     Insulin pump  . Hypothyroidism   . Diastolic CHF     a. 0/9381 - due to AF RVR.  Marland Kitchen Atrial fibrillation    Past Surgical History  Procedure Laterality Date  .  Tonsillectomy    . Ovarian cyst surgery    . Colonoscopy      1 polyp   . 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 Appendectomy     Family History  Problem Relation Age of Onset  . Breast cancer Mother 25  . Prostate cancer Maternal Uncle     died in his 40s  . Heart failure Mother   . Heart attack Father    History  Substance Use Topics  . Smoking status: Never Smoker   . Smokeless tobacco: Not on file  . Alcohol Use: No   OB History    Gravida Para Term Preterm AB TAB SAB Ectopic Multiple Living   3 3              Obstetric Comments   Menarche age 66, Parity age 64, Last Menses age 73, Oral contraceptives less than a month. applie Progesterone cream to chest for over 15 years.     Review of Systems  All other systems negative except as documented in the HPI. All pertinent positives and negatives as reviewed in the HPI.  Allergies  Statins  Home Medications   Prior to Admission medications   Medication Sig Start Date End Date Taking? Authorizing  Provider  apixaban (ELIQUIS) 5 MG TABS tablet Take 1 tablet (5 mg total) by mouth 2 (two) times daily. 01/12/15  Yes Abelino Derrick, PA-C  Ascorbic Acid (VITAMIN C) 1000 MG tablet Take 2,000 mg by mouth 2 (two) times daily.    Yes Historical Provider, MD  Cholecalciferol (VITAMIN D-3) 5000 UNITS TABS Take 5,000 Units by mouth daily.    Yes Historical Provider, MD  [facility] Liver Oil 1000 MG CAPS Take 2,000 mg by mouth 2 (two) times daily.    Yes Historical Provider, MD  Coenzyme Q10 (COQ10) 200 MG CAPS Take 200 mg by mouth daily.   Yes Historical Provider, MD  Cyanocobalamin (VITAMIN B-12) 5000 MCG SUBL Place 5,000 mcg under the tongue daily.   Yes Historical Provider, MD  Digestive Enzymes (BETAINE HCL PO) Take 1 tablet by mouth every 3 (three) days.   Yes Historical Provider,  MD  diltiazem (CARDIZEM CD) 240 MG 24 hr capsule Take 1 capsule (240 mg total) by mouth daily. 03/04/15  Yes Chrystie Nose, MD  ferrous sulfate (FERROUSUL) 325 (65 FE) MG tablet Take 1 tablet (325 mg total) by mouth 2 (two) times daily with a meal. Patient taking differently: Take 325 mg by mouth daily.  01/28/15  Yes Donita Brooks, MD  flecainide (TAMBOCOR) 50 MG tablet Take 1 tablet (50 mg total) by mouth every 12 (twelve) hours. 03/04/15  Yes Chrystie Nose, MD  insulin lispro (HUMALOG) 100 UNIT/ML injection Basal rate 0.9 7am-12, 0.8 Noon-7am. Max of 60 units SQ every day with pump mer MD DX CODE: E10.65 Patient taking differently: Insulin pump - Basal rate from 12 am-4:59 am at 0.45 units/hr, 5 am-6:59 am at 0.9 units/hr, 7 am-9:59 am at 1 unit/hr, 10 am-11:59 am at 1.1 units/hr, 12 pm-3:59 pm at 0.9 units/hr, 4 pm-8:59 at 0.8 units/hr, 9 pm-11:59 pm at 0.75 units/hr. Max of 100 units SQ every day; max of 50 units SQ in a 2 hr time frame with pump mer MD DX CODE: E10.65 12/31/14  Yes Reather Littler, MD  Magnesium 400 MG CAPS Take 400 mg by mouth daily.   Yes Historical Provider, MD  metoprolol tartrate (LOPRESSOR) 25 MG tablet Take 12.5 mg by mouth daily.   Yes Historical Provider, MD  Misc Natural Products (GLUCOSAMINE CHONDROITIN MSM PO) Take 1 tablet by mouth daily.   Yes Historical Provider, MD  Misc Natural Products (PROGESTERONE EX) Apply 1 ml to chest daily   Yes Historical Provider, MD  NALTREXONE HCL PO Take 4.5 mg by mouth daily.   Yes Historical Provider, MD  OVER THE COUNTER MEDICATION Take 1 tablet by mouth daily. DIM Supplement   Yes Historical Provider, MD  RESVERATROL PO Take 1 tablet by mouth daily.   Yes Historical Provider, MD  thyroid (ARMOUR) 30 MG tablet Take 30 mg by mouth every other day.    Yes Historical Provider, MD  thyroid (ARMOUR) 60 MG tablet Take 60 mg by mouth every other day.   Yes Historical Provider, MD  VITAMIN K PO Take 90 mcg by mouth 2 (two) times daily.    Yes Historical Provider, MD  acidophilus (RISAQUAD) CAPS capsule Take 1 capsule by mouth daily. Patient not taking: Reported on 03/06/2015 01/10/15   Lonia Blood, MD  cyanocobalamin 1000 MCG tablet Take 1,000 mcg by mouth daily.     Historical Provider, MD   BP 109/46 mmHg  Pulse 59  Temp(Src) 98 F (36.7 C) (Oral)  Resp 15  Ht '5\' 4"'$  (1.626 m)  Wt 142 lb (64.411 kg)  BMI 24.36 kg/m2  SpO2 96% Physical Exam  Constitutional: She is oriented to person, place, and time. She appears well-developed and well-nourished. No distress.  HENT:  Head: Normocephalic and atraumatic.  Mouth/Throat: Oropharynx is clear and moist.  Eyes: Pupils are equal, round, and reactive to light.  Neck: Normal range of motion. Neck supple.  Cardiovascular: Normal rate, regular rhythm, normal heart sounds and intact distal pulses.  Exam reveals no gallop and no friction rub.   No murmur heard. Pulmonary/Chest: Effort normal and breath sounds normal. No respiratory distress. She exhibits no tenderness.  Abdominal: Soft. Bowel sounds are normal. She exhibits no distension. There is no tenderness.  Musculoskeletal: She exhibits no edema.  Neurological: She is alert and oriented to person, place, and time. She exhibits normal muscle tone. Coordination normal.  Skin: Skin is warm and dry. No rash noted. No erythema.  Psychiatric: She has a normal mood and affect. Her behavior is normal.  Nursing note and vitals reviewed.   ED Course  Procedures (including critical care time) Labs Review Labs Reviewed  CBC WITH DIFFERENTIAL/PLATELET - Abnormal; Notable for the following:    Hemoglobin 11.8 (*)    HCT 35.2 (*)    All other components within normal limits  COMPREHENSIVE METABOLIC PANEL - Abnormal; Notable for the following:    Sodium 127 (*)    Chloride 91 (*)    Glucose, Bld 192 (*)    Calcium 8.5 (*)    Total Protein 6.4 (*)    Albumin 3.3 (*)    Total Bilirubin 1.5 (*)    All other components within  normal limits  D-DIMER, QUANTITATIVE - Abnormal; Notable for the following:    D-Dimer, Quant 1.37 (*)    All other components within normal limits  I-STAT TROPOININ, ED    Imaging Review Dg Chest Portable 1 View  03/06/2015   CLINICAL DATA:  Palpitations, chest pain since yesterday. Hypertension, atrial fibrillation.  EXAM: PORTABLE CHEST - 1 VIEW  COMPARISON:  01/11/2015  FINDINGS: Heart is borderline in size. No confluent opacities or effusions. No acute bony abnormality.  IMPRESSION: No active disease.   Electronically Signed   By: Rolm Baptise M.D.   On: 03/06/2015 10:50     EKG Interpretation   Date/Time:  Sunday Mar 06 2015 10:19:27 EDT Ventricular Rate:  60 PR Interval:  158 QRS Duration: 85 QT Interval:  380 QTC Calculation: 380 R Axis:   83 Text Interpretation:  Sinus rhythm Consider left atrial enlargement  Borderline right axis deviation ST elevation, consider inferior injury  Abnormal ekg Confirmed by Audie Pinto  MD, ROBERT ([id number]) on 03/06/2015 10:24:45  AM       I spoke with Dr. Rayann Heman cardiology who reviewed the patient's EKGs and testing and results from today.  He advised to increase her flecainide 100 mg twice a day.  He states that they will call her with an appointment in atrial fibrillation clinic for this week.  Patient is advised of the plan and all questions were answered.  Patient is advised return here for any worsening in her condition  Dalia Heading, PA-C 03/06/15 [place], MD 03/07/15 [phone]

## 2015-03-06 NOTE — Discharge Instructions (Signed)
Return here as needed.  Follow-up with your cardiologist.  They will call you with an appointment increase your flecainide 100 mg twice a day.

## 2015-03-06 NOTE — ED Notes (Addendum)
Pt presents to department for evaluation of palpitations and chest pain. Onset Saturday morning. History of atrial fibrillation. Pt states she felt heart beating rapidly when she woke up, also states 3/10 midsternal chest pressure. Respirations unlabored. Pt is alert and oriented x4.

## 2015-03-06 NOTE — ED Notes (Signed)
Pt brought back to room; undressed, in gown, on monitor, continuous pulse oximetry and blood pressure cuff

## 2015-03-06 NOTE — Telephone Encounter (Signed)
Patient called Sunday answering service. See phone note yesterday. HR came back under control yesterday then popped back up this morning. She took her AM meds and it came under control again. However, she now reports a sensation of chest pressure every time she takes a deep breath - like a "weight on my chest." This is unusual for her. She also reports a cough. These symptoms seem independent of her HR which is still under control.  At this point I don't think there is much more I can offer over the phone. Pleuritic CP can be a manifestation of multiple processes including PE, pericarditis, PNA, etc. Given her medical history and ongoing complaints I advised she seek medical care so she can be evaluated in person. Her husband will be driving her to the ER.  Dayna Dunn PA-C

## 2015-03-08 ENCOUNTER — Ambulatory Visit (INDEPENDENT_AMBULATORY_CARE_PROVIDER_SITE_OTHER): Payer: Medicare Other | Admitting: Internal Medicine

## 2015-03-08 ENCOUNTER — Encounter: Payer: Self-pay | Admitting: Internal Medicine

## 2015-03-08 VITALS — BP 118/60 | HR 72 | Ht 64.0 in | Wt 142.0 lb

## 2015-03-08 DIAGNOSIS — I4891 Unspecified atrial fibrillation: Secondary | ICD-10-CM | POA: Diagnosis not present

## 2015-03-08 DIAGNOSIS — I10 Essential (primary) hypertension: Secondary | ICD-10-CM

## 2015-03-08 NOTE — Patient Instructions (Signed)
You may take an extra 1/2 tablet of metoprolol as needed for heart rate greater than 110 beats/minute  Dr. Debara Pickett recommends that you schedule an appointment with Dr. Rayann Heman - Atrial Fibrillation Clinic  Your physician recommends that you schedule a follow-up appointment in 3 months with Dr. Debara Pickett

## 2015-03-08 NOTE — Progress Notes (Signed)
OFFICE NOTE  Chief Complaint:  Hospital follow-up, fatigue  Primary Care Physician: Odette Fraction, MD  HPI:  Gina [name] this pleasant 69 year old female who is following up with me from her recent hospitalization. Unfortunately she was admitted multiple times for symptoms and ultimately found to have A. fib with RVR. Did have problems with hypotension. Her past medical history significant for type 1 diabetes on insulin pump, hypertension, dyslipidemia, hypothyroidism, who presented to the ER with complaints of dizziness, lightheadedness and syncope. She was hypotensive on admission with blood pressure 62/48 that seem to improve with IV fluids. She was also bradycardic. In addition she was hypothermic and placed on sepsis protocol. She received broad-spectrum antibiotics. There is no initial complaint of chest pain, however she did have nausea, dizziness and diarrhea. EKG shows nonspecific changes and poor R-wave progression. Initial troponin was negative however subsequent troponin was elevated 0.61. An echocardiogram was ordered but not yet obtained. I saw her in consultation. She ultimately had an echocardiogram which showed a normal EF and no wall motion abnormalities. A nuclear stress test was performed which was an exercise stress test and was negative for ischemia. She was seen by Dr. Rayann Heman and he recommended flecainide 50 mg twice a day in addition to diltiazem long-acting. She also was started on metoprolol. After discharge she followed up in the A. fib clinic and saw [name], NP. No changes were made in her medicines and she was scheduled to see Dr. Rayann Heman in 6-8 weeks (although I do not see that appointment was made). Her main complaint today is ongoing fatigue. She says she just has not quite gotten her energy back. EKG today shows sinus bradycardia at 52. She denies any recurrence of A. Fib.  Gina [name] returns today for follow-up. The day after I saw her in the office,  she had recurrent atrial fibrillation. She called the weekend PA on call and she made some advice about further management. Because she had persistent symptoms she was ultimately advised to present to the hospital. She was seen in emergency department. Dr. Rayann Heman was contacted and recommended doubling her flecainide to 100 mg twice a day. Follow-up was arranged with me in the office today. She still reporting some morning tachycardia. She says it's worse when she wakes up. I wonder if there is undiagnosed sleep apnea. She does have snoring which is reported by her husband and some fatigue. A sleep study was recommended but she politely declined which she was seen in the A. fib clinic.  PMHx:  Past Medical History  Diagnosis Date  . Ovarian cyst   . Hypertension   . Insulin pump in place   . 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 Anxiety     new dx  . Hyperlipidemia   . S/P radiation therapy 04/10/12- 05/26/2012    left Breast and Axilla / 50 gy / 25 Fractions with Left Breast Boost / 10 Gy / 5 Fractions  . Use of letrozole (Femara) 06/13/12  . Diabetes mellitus type 1     Insulin pump  . Hypothyroidism   . Diastolic CHF     a. 11/5051 - due to AF RVR.  Marland Kitchen Atrial fibrillation     Past Surgical History  Procedure Laterality Date  . Tonsillectomy    . Ovarian cyst surgery    . Colonoscopy      1 polyp   .  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 Appendectomy      FAMHx:  Family History  Problem Relation Age of Onset  . Breast cancer Mother 60  . Prostate cancer Maternal Uncle     died in his 56s  . Heart failure Mother   . Heart attack Father     SOCHx:   reports that she has never smoked. She does not have any  smokeless tobacco history on file. She reports that she does not drink alcohol or use illicit drugs.  ALLERGIES:  Allergies  Allergen Reactions  . Statins Other (See Comments)    Leg pain    ROS: A comprehensive review of systems was negative except for: Constitutional: positive for fatigue Cardiovascular: positive for palpitations  HOME MEDS: Current Outpatient Prescriptions  Medication Sig Dispense Refill  . acidophilus (RISAQUAD) CAPS capsule Take 1 capsule by mouth daily. (Patient taking differently: Take 1 capsule by mouth as needed. ) 10 capsule 0  . apixaban (ELIQUIS) 5 MG TABS tablet Take 1 tablet (5 mg total) by mouth 2 (two) times daily. 60 tablet 11  . Ascorbic Acid (VITAMIN C) 1000 MG tablet Take 2,000 mg by mouth 2 (two) times daily.     . Cholecalciferol (VITAMIN D-3) 5000 UNITS TABS Take 5,000 Units by mouth daily.     Marland Kitchen Cod Liver Oil 1000 MG CAPS Take 2,000 mg by mouth 2 (two) times daily.     . Coenzyme Q10 (COQ10) 200 MG CAPS Take 200 mg by mouth daily.    . Cyanocobalamin (VITAMIN B-12) 5000 MCG SUBL Place 5,000 mcg under the tongue daily.    . Digestive Enzymes (BETAINE HCL PO) Take 1 tablet by mouth every 3 (three) days.    Marland Kitchen diltiazem (CARDIZEM CD) 240 MG 24 hr capsule Take 1 capsule (240 mg total) by mouth daily. 30 capsule 11  . ferrous sulfate (FERROUSUL) 325 (65 FE) MG tablet Take 1 tablet (325 mg total) by mouth 2 (two) times daily with a meal. (Patient taking differently: Take 325 mg by mouth daily. )  3  . flecainide (TAMBOCOR) 100 MG tablet Take 100 mg by mouth 2 (two) times daily.    . insulin lispro (HUMALOG) 100 UNIT/ML injection Basal rate 0.9 7am-12, 0.8 Noon-7am. Max of 60 units SQ every day with pump mer MD DX CODE: E10.65 (Patient taking differently: Insulin pump - Basal rate from 12 am-4:59 am at 0.45 units/hr, 5 am-6:59 am at 0.9 units/hr, 7 am-9:59 am at 1 unit/hr, 10 am-11:59 am at 1.1 units/hr, 12 pm-3:59 pm at 0.9 units/hr, 4 pm-8:59 at 0.8  units/hr, 9 pm-11:59 pm at 0.75 units/hr. Max of 100 units SQ every day; max of 50 units SQ in a 2 hr time frame with pump mer MD DX CODE: E10.65) 20 mL 6  . Magnesium 400 MG CAPS Take 400 mg by mouth daily.    . metoprolol tartrate (LOPRESSOR) 25 MG tablet Take 12.5 mg by mouth 2 (two) times daily.     . Misc Natural Products (GLUCOSAMINE CHONDROITIN MSM PO) Take 1 tablet by mouth daily.    . Misc Natural Products (PROGESTERONE EX) Apply 1 ml to chest daily    . NALTREXONE HCL PO Take 4.5 mg by mouth daily.    Marland Kitchen OVER THE COUNTER MEDICATION Take 1 tablet by mouth daily.  DIM Supplement    . RESVERATROL PO Take 1 tablet by mouth daily.    Marland Kitchen thyroid (ARMOUR) 30 MG tablet Take 30 mg by mouth every other day.     . thyroid (ARMOUR) 60 MG tablet Take 60 mg by mouth every other day.    Marland Kitchen VITAMIN K PO Take 90 mcg by mouth 2 (two) times daily.     No current facility-administered medications for this visit.   Facility-Administered Medications Ordered in Other Visits  Medication Dose Route Frequency Provider Last Rate Last Dose  . topical emolient (BIAFINE) emulsion   Topical Daily Eppie Gibson, MD        LABS/IMAGING: No results found for this or any previous visit (from the past 48 hour(s)). No results found.  WEIGHTS: Wt Readings from Last 3 Encounters:  03/08/15 142 lb (64.411 kg)  03/06/15 142 lb (64.411 kg)  03/04/15 146 lb 1.6 oz (66.271 kg)    VITALS: BP 118/60 mmHg  Pulse 72  Ht $R'5\' 4"'PQ$  (1.626 m)  Wt 142 lb (64.411 kg)  BMI 24.36 kg/m2  EXAM: Deferred  EKG: Normal sinus rhythm at 72  ASSESSMENT: 1. Paroxysmal atrial fibrillation -CHADSVASC score 3 2. Elevated troponin likely secondary to sepsis with a negative nuclear stress test 3. Normal systolic and diastolic function on echo with normal left atrial size  PLAN: 1.   Gina [name] presented with recurrent atrial fibrillation the day after saw her in the office. I did advise that she could cut back if not stop her beta  blocker due to significant fatigue.  She never made that change. At this point I recommend she continue on her beta blocker, in fact she could take an extubated beta blocker she becomes tachycardic. Her flecainide dose was increased to 100 mg twice a day. I like to get her into see Dr. Rayann Heman within the next couple of weeks if possible. There may need to be evaluation for A. fib ablation.   Pixie Casino, MD, [facility] Attending Cardiologist [place] 03/08/2015, 3:16 PM

## 2015-03-09 ENCOUNTER — Telehealth: Payer: Self-pay | Admitting: Internal Medicine

## 2015-03-09 ENCOUNTER — Ambulatory Visit (HOSPITAL_COMMUNITY)
Admission: RE | Admit: 2015-03-09 | Discharge: 2015-03-09 | Disposition: A | Discharge status: Home / Self Care | Payer: Medicare Other | Source: Ambulatory Visit | Attending: Nurse Practitioner | Admitting: Nurse Practitioner

## 2015-03-09 ENCOUNTER — Encounter (HOSPITAL_COMMUNITY): Payer: Self-pay | Admitting: Nurse Practitioner

## 2015-03-09 VITALS — BP 102/60 | HR 118 | Ht 64.0 in | Wt 140.4 lb

## 2015-03-09 DIAGNOSIS — G4733 Obstructive sleep apnea (adult) (pediatric): Secondary | ICD-10-CM | POA: Insufficient documentation

## 2015-03-09 DIAGNOSIS — I480 Paroxysmal atrial fibrillation: Secondary | ICD-10-CM | POA: Diagnosis present

## 2015-03-09 NOTE — Patient Instructions (Signed)
Your physician has recommended that you have a sleep study. This test records several body functions during sleep, including: brain activity, eye movement, oxygen and carbon dioxide blood levels, heart rate and rhythm, breathing rate and rhythm, the flow of air through your mouth and nose, snoring, body muscle movements, and chest and belly movement.     I will call you with sleep study appointment once I have talked with your insurance company.  Call us in the morning with your heart rate to determine if cardioversion needed or not. [phone]  Appointment with Dr. Rayann Heman in 6-8 weeks -- office will call you with appointment.

## 2015-03-09 NOTE — Telephone Encounter (Signed)
Spoke w. Dr. Debara Pickett, advised best next step would be to get in to A Fib clinic, from there get patient followed up by Dr. Rayann Heman for ablation consult.  Spoke w/ [name], she informed me she could see patient at 3:30pm today.  Pt was called, notified - instructed to call A Fib clinic number if questions.

## 2015-03-09 NOTE — Telephone Encounter (Signed)
Pt notes HR 143 @ 3am -> took extra 1/2 metoprolol @ 5:30am HR 134 -> took all morning meds including metoprolol  @9am  - HR 84 , pt also c/o headache  @10am  when I called, she took BP - 113/71, noted HR was back up to 124.  She notes same symptoms (still having headache).  Just seen in office yesterday by Dr. Debara Pickett. I called A Fib clinic to see if we can schedule, left message for Marzetta Board or [name] to call. Will route message.

## 2015-03-09 NOTE — Telephone Encounter (Signed)
Pt is having an extremely high pulse rate ,it started at 3:30 this morning. It was 143,took an extra 1/2 tablet of Metoprolol. At 5:30 this morning it was still 134. Marland KitchenPlease call to advise.She now has a severe headache.

## 2015-03-09 NOTE — Progress Notes (Signed)
Patient ID: Gina [name], female   DOB: [birth year], 69 y.o.   MRN: [id number]         Patient ID: Gina [name], female   DOB: July 07, 1946, 69 y.o.   MRN: [id number]     Primary Care Physician: Odette Fraction, MD Primary Cardiologist: Dr. Debara Pickett Primary Electrophysiologist:Allred Referring Physician: [facility] F/U   Gina [name] is a 69 y.o. female with a h/o Type 1 DM, h/o left breast ca with radiation/lumpectomy, paroxysmal atrial fibrillation who presents for consultation in the [facility].  The patient was initially diagnosed with atrial fibrillation  after presenting with symptoms of dizziness and bilateral arm tingling at her PCP's office 3/29 with AFib with RVR and was admitted to [facility] for further treatment.  She has undergone a nuclear study which revealed normal EF and an echo with normal EF 55-60% with normal wall motion, trace MR/mild TR. Left atrial size 36 mm. She has been placed on flecainide/cardizem/toprol and eliquis 48m bid for cha2ds2vasc score of at least 3. CXR showed vascular congestion and was given lasix x one dose. She is tolerating current medications. She has been maintaining NSR.  She had breakthrough afib about a week ago but it was not sustained. She then reported to the ER 2 days ago with afib and f/u with Dr. HDebara Pickettand flecainide was increased to 10 mg bid yesterday. She woke up last night around 3 am, in afib and took an extra metoprolol at that time. She was able to go back to sleep and when she woke up this am, she was still in afib, took her usual am Cardizem, Toprol and flecainide. Afib has persisted during the day and now has afib with v rate of 118 bpm. She does snore and has refused sleep study before, but now since she is having more issues, she is open to have the study done. Has been taking Eliquis without fail.  Today, she denies symptoms of  chest pain, shortness of breath, orthopnea, PND, lower extremity edema,  dizziness, presyncope, syncope. She does have h/o snoring with minimal daytime somnolence. Positive for fatigue and palpitations.No bleeding, or neurologic sequela. The patient is tolerating medications without difficulties and is otherwise without complaint today.    Atrial Fibrillation Risk Factors:  she   does have symptoms or diagnosis of sleep apnea.  she does not have a history of rheumatic fever.  she does not have a history of alcohol use.  she has a BMI of Body mass index is 24.09 kg/(m^2)..Marland KitchenFiled Weights   03/09/15 1534  Weight: 140 lb 6.4 oz (63.685 kg)    LA size: 35m  Atrial Fibrillation Management history:  Previous antiarrhythmic drugs: currently flecainide increased to 100 mg a day(5/24)  Previous cardioversions: none  Previous ablations: none  CHADS2VASC score: at least 3  Anticoagulation history: eliquis   Past Medical History  Diagnosis Date  . Ovarian cyst   . Hypertension   . Insulin pump in place   . 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 Anxiety     new dx  . Hyperlipidemia   . S/P radiation therapy 04/10/12- 05/26/2012    left Breast and Axilla / 50 gy / 25 Fractions with Left Breast Boost / 10 Gy / 5 Fractions  . Use of letrozole (Femara) 06/13/12  . Diabetes mellitus type 1     Insulin pump  .  Hypothyroidism   . Diastolic CHF     a. 01/6567 - due to AF RVR.  Marland Kitchen Atrial fibrillation    Past Surgical History  Procedure Laterality Date  . Tonsillectomy    . Ovarian cyst surgery    . Colonoscopy      1 polyp   . 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 Appendectomy      Current Outpatient Prescriptions  Medication Sig Dispense Refill  . acidophilus (RISAQUAD) CAPS  capsule Take 1 capsule by mouth daily. (Patient taking differently: Take 1 capsule by mouth as needed. ) 10 capsule 0  . apixaban (ELIQUIS) 5 MG TABS tablet Take 1 tablet (5 mg total) by mouth 2 (two) times daily. 60 tablet 11  . Ascorbic Acid (VITAMIN C) 1000 MG tablet Take 2,000 mg by mouth 2 (two) times daily.     . Cholecalciferol (VITAMIN D-3) 5000 UNITS TABS Take 5,000 Units by mouth daily.     Marland Kitchen Cod Liver Oil 1000 MG CAPS Take 2,000 mg by mouth 2 (two) times daily.     . Coenzyme Q10 (COQ10) 200 MG CAPS Take 200 mg by mouth daily.    . Cyanocobalamin (VITAMIN B-12) 5000 MCG SUBL Place 5,000 mcg under the tongue daily.    . Digestive Enzymes (BETAINE HCL PO) Take 1 tablet by mouth every 3 (three) days.    Marland Kitchen diltiazem (CARDIZEM CD) 240 MG 24 hr capsule Take 1 capsule (240 mg total) by mouth daily. 30 capsule 11  . ferrous sulfate (FERROUSUL) 325 (65 FE) MG tablet Take 1 tablet (325 mg total) by mouth 2 (two) times daily with a meal. (Patient taking differently: Take 325 mg by mouth daily. )  3  . flecainide (TAMBOCOR) 100 MG tablet Take 100 mg by mouth 2 (two) times daily.    . insulin lispro (HUMALOG) 100 UNIT/ML injection Basal rate 0.9 7am-12, 0.8 Noon-7am. Max of 60 units SQ every day with pump mer MD DX CODE: E10.65 (Patient taking differently: Insulin pump - Basal rate from 12 am-4:59 am at 0.45 units/hr, 5 am-6:59 am at 0.9 units/hr, 7 am-9:59 am at 1 unit/hr, 10 am-11:59 am at 1.1 units/hr, 12 pm-3:59 pm at 0.9 units/hr, 4 pm-8:59 at 0.8 units/hr, 9 pm-11:59 pm at 0.75 units/hr. Max of 100 units SQ every day; max of 50 units SQ in a 2 hr time frame with pump mer MD DX CODE: E10.65) 20 mL 6  . Magnesium 400 MG CAPS Take 400 mg by mouth daily.    . metoprolol tartrate (LOPRESSOR) 25 MG tablet Take 12.5 mg by mouth 2 (two) times daily.     . Misc Natural Products (GLUCOSAMINE CHONDROITIN MSM PO) Take 1 tablet by mouth daily.    . Misc Natural Products (PROGESTERONE EX) Apply 1 ml to chest  daily    . NALTREXONE HCL PO Take 4.5 mg by mouth daily.    Marland Kitchen OVER THE COUNTER MEDICATION Take 1 tablet by mouth daily. DIM Supplement    . RESVERATROL PO Take 1 tablet by mouth daily.    Marland Kitchen thyroid (ARMOUR) 30 MG tablet Take 30 mg by mouth every other day.     . thyroid (ARMOUR) 60 MG tablet Take 60 mg by mouth every other day.    Marland Kitchen VITAMIN K PO Take 90 mcg by mouth 2 (  two) times daily.     No current facility-administered medications for this encounter.   Facility-Administered Medications Ordered in Other Encounters  Medication Dose Route Frequency Provider Last Rate Last Dose  . topical emolient (BIAFINE) emulsion   Topical Daily Eppie Gibson, MD        Allergies  Allergen Reactions  . Statins Other (See Comments)    Leg pain    History   Social History  . Marital Status: Married    Spouse Name: N/A  . Number of Children: N/A  . Years of Education: N/A   Occupational History  . Not on file.   Social History Main Topics  . Smoking status: Never Smoker   . Smokeless tobacco: Not on file  . Alcohol Use: No  . Drug Use: No  . Sexual Activity: Yes   Other Topics Concern  . Not on file   Social History Narrative    Family History  Problem Relation Age of Onset  . Breast cancer Mother 86  . Prostate cancer Maternal Uncle     died in his 77s  . Heart failure Mother   . Heart attack Father    The patientdoes not have a history of early familial atrial fibrillation or other arrhythmias.  ROS- All systems are reviewed and negative except as per the HPI above.  Physical Exam: Filed Vitals:   03/09/15 1534  BP: 102/60  Pulse: 118  Height: 5' 4" (1.626 m)  Weight: 140 lb 6.4 oz (63.685 kg)    GEN- The patient is well appearing, alert and oriented x 3 today.   Head- normocephalic, atraumatic Eyes-  Sclera clear, conjunctiva pink Ears- hearing intact Oropharynx- clear Neck- supple, no JVP Lymph- no cervical lymphadenopathy Lungs- Clear to ausculation  bilaterally, normal work of breathing Heart- Regular rate and rhythm, no murmurs, rubs or gallops, PMI not laterally displaced GI- soft, NT, ND, + BS Extremities- no clubbing, cyanosis, or edema MS- no significant deformity or atrophy Skin- no rash or lesion Psych- euthymic mood, full affect Neuro- strength and sensation are intact  EKG EKG shows afib with RVR 118 bpm,,   QRS 76 ms, QTc 414 ms.  Echo Left ventricle: The cavity size was normal. Wall thickness was normal. Systolic function was normal. The estimated ejection fraction was in the range of 55% to 60%. Wall motion was normal; there were no regional wall motion abnormalities. Left ventricular diastolic function parameters were normal. - Pulmonary arteries: PA peak pressure: 39 mm Hg (S).  Nuclear stress test- 01/13/15- IMPRESSION: 1. No reversible ischemia or infarction.  2. Normal left ventricular wall motion.  3. Left ventricular ejection fraction 71%  4. Low-risk stress test findings*.  Epic records are reviewed today  Assessment and Plan:  1. Atrial fibrillation The patient has Symptomatic paroxysmal atrial fibrillation.  The patients CHAD2VASC score is at least 3.  she is  appropriately anticoagulated at this time. The patient is  rate controlled with metoprolol and cardizem. Antiarrhythmic therapy to dates has included flecainide, failed at 50 mg bid and increased to 100 mg bid   The patients left atrial size is 36 mm.  Additional echo findings include normal LV function. A long discussion with the patient was had today regarding therapeutic strategies.  Extensive discussion of lifestyle modification including begin progressive daily aerobic exercise program was also discussed.  Presently, our recommendations include :   1. PAF Currently in afib with RVR Continue flecainide, metoprolol and Cardizem. Will see if she converts  by am with additional flecainide tonight and in am with other rate control  drugs. If not, she will be NPO in the am other than am drugs with sip of water and will set up for DCCV asap.  2. Healthy lifestyle She already has a walking program and encouraged to continue on a regular basis.  3. Obstructive sleep apnea The importance of adequate treatment of sleep apnea was discussed today in order to improve our ability to maintain sinus rhythm long term. She is open to schedule sleep study and will be arranged.  4. F/u with Dr. Rayann Heman in 6-8 weeks to further discuss ablation   [name] NP  Nurse Practitioner, [facility] 03/09/2015 4:40 PM

## 2015-03-10 ENCOUNTER — Telehealth (HOSPITAL_COMMUNITY): Payer: Self-pay | Admitting: *Deleted

## 2015-03-10 NOTE — Telephone Encounter (Signed)
Patient called back in this morning stating she was feeling better this morning her bp was 116/67 hr 56. Appt was scheduled for next week to repeat ekg since increasing flecainide dose. Patient will call with any problems.

## 2015-03-16 ENCOUNTER — Ambulatory Visit (HOSPITAL_COMMUNITY)
Admission: RE | Admit: 2015-03-16 | Discharge: 2015-03-16 | Disposition: A | Discharge status: Home / Self Care | Payer: Medicare Other | Source: Ambulatory Visit | Attending: Nurse Practitioner | Admitting: Nurse Practitioner

## 2015-03-16 DIAGNOSIS — I4891 Unspecified atrial fibrillation: Secondary | ICD-10-CM | POA: Insufficient documentation

## 2015-03-16 DIAGNOSIS — I480 Paroxysmal atrial fibrillation: Secondary | ICD-10-CM | POA: Diagnosis not present

## 2015-03-16 NOTE — Progress Notes (Signed)
EKG intervals are within normal limits. IN SR today. Feels very tired and is still having some breakthrough afib. Has an appointment with Dr. Rayann Heman on Friday to discuss ablation per pts request.

## 2015-03-16 NOTE — Progress Notes (Signed)
Patient in for ekg after increasing dose of flecainide.  Patient tolerating increased dose of flecainide. [name], NP to review.

## 2015-03-18 ENCOUNTER — Encounter: Payer: Self-pay | Admitting: *Deleted

## 2015-03-18 ENCOUNTER — Ambulatory Visit (INDEPENDENT_AMBULATORY_CARE_PROVIDER_SITE_OTHER): Payer: Medicare Other | Admitting: Internal Medicine

## 2015-03-18 ENCOUNTER — Encounter: Payer: Self-pay | Admitting: Internal Medicine

## 2015-03-18 VITALS — BP 120/78 | HR 100 | Ht 64.0 in | Wt 141.6 lb

## 2015-03-18 DIAGNOSIS — I4891 Unspecified atrial fibrillation: Secondary | ICD-10-CM | POA: Diagnosis not present

## 2015-03-18 DIAGNOSIS — I483 Typical atrial flutter: Secondary | ICD-10-CM

## 2015-03-18 NOTE — Addendum Note (Signed)
Addended by: Alvis Lemmings C on: 03/18/2015 10:43 AM   Modules accepted: Orders

## 2015-03-18 NOTE — Patient Instructions (Signed)
Medication Instructions: - no changes  Labwork: - Your physician recommends that you return for lab work : Friday 04/01/15  Procedures/Testing: - Your physician has requested that you have a TEE. During a TEE, sound waves are used to create images of your heart. It provides your doctor with information about the size and shape of your heart and how well your heart's chambers and valves are working. In this test, a transducer is attached to the end of a flexible tube that's guided down your throat and into your esophagus (the tube leading from you mouth to your stomach) to get a more detailed image of your heart. You are not awake for the procedure. Please see the instruction sheet given to you today. For further information please visit [email or web address].  - Your physician has recommended that you have an ablation. Catheter ablation is a medical procedure used to treat some cardiac arrhythmias (irregular heartbeats). During catheter ablation, a long, thin, flexible tube is put into a blood vessel in your groin (upper thigh), or neck. This tube is called an ablation catheter. It is then guided to your heart through the blood vessel. Radio frequency waves destroy small areas of heart tissue where abnormal heartbeats may cause an arrhythmia to start. Please see the instruction sheet given to you today.  Follow-Up: - Your physician recommends that you schedule a follow-up appointment in: 7 weeks with Gina Palau, NP for Dr. Rayann [name]  Any Additional Special Instructions Will Be Listed Below (If Applicable). - none

## 2015-03-18 NOTE — Progress Notes (Signed)
Patient ID: Gina [name], female   DOB: [birth year], 68 y.o.   MRN: [id number]         Patient ID: Gina [name], female   DOB: 1946-05-31, 69 y.o.   MRN: [id number]     Primary Care Physician: Odette Fraction, MD Primary Cardiologist: Dr. Debara Pickett Primary Electrophysiologist:Hobart Marte Referring Physician: [facility] F/U   Gina [name] is a 69 y.o. female with a h/o Type 1 DM, h/o left breast ca with radiation/lumpectomy, paroxysmal atrial fibrillation who presents for EP follow-up after being seen recently by Gina Palau NP in the AF clinic.  The patient was initially diagnosed with atrial fibrillation  after presenting with symptoms of dizziness and bilateral arm tingling at her PCP's office 3/29 with AFib with RVR and was admitted to [facility] for further treatment.  She has undergone a nuclear study which revealed normal EF and an echo with normal EF 55-60% with normal wall motion, trace MR/mild TR. Left atrial size 36 mm. She has been placed on flecainide/cardizem/toprol and eliquis 5mg  bid for cha2ds2vasc score of at least 3. CXR showed vascular congestion and was given lasix x one dose.  She reports increasing frequency and duration of atrial fibrillation despite flecainide 100mg  BId.  She feels "washed out" with her afib.  Her exercise tolerance is low.   Today, she denies symptoms of  chest pain, shortness of breath, orthopnea, PND, lower extremity edema, dizziness, presyncope, syncope. She does have h/o snoring with minimal daytime somnolence. Positive for fatigue and palpitations.No bleeding, or neurologic sequela. The patient is tolerating medications without difficulties and is otherwise without complaint today.    she has a BMI of Body mass index is 24.29 kg/(m^2).Marland Kitchen Filed Weights   03/18/15 0912  Weight: 64.229 kg (141 lb 9.6 oz)    LA size: 54mm   Atrial Fibrillation Management history:  Previous antiarrhythmic drugs: currently flecainide increased to 100 mg a  day(5/24)  Previous cardioversions: none  Previous ablations: none  CHADS2VASC score: at least 3  Anticoagulation history: eliquis   Past Medical History  Diagnosis Date  . Ovarian cyst   . Hypertension   . Insulin pump in place   . 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 Anxiety     new dx  . Hyperlipidemia   . S/P radiation therapy 04/10/12- 05/26/2012    left Breast and Axilla / 50 gy / 25 Fractions with Left Breast Boost / 10 Gy / 5 Fractions  . Use of letrozole (Femara) 06/13/12  . Diabetes mellitus type 1     Insulin pump  . Hypothyroidism   . Diastolic CHF     a. 10/9620 - due to AF RVR.  Marland Kitchen Atrial fibrillation    Past Surgical History  Procedure Laterality Date  . Tonsillectomy    . Ovarian cyst surgery    . Colonoscopy      1 polyp   . 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 Appendectomy      Current Outpatient Prescriptions  Medication Sig Dispense Refill  . apixaban (ELIQUIS) 5 MG TABS tablet Take 1 tablet (5 mg total) by mouth 2 (two) times daily. 60 tablet 11  . Ascorbic Acid (VITAMIN C) 1000 MG tablet Take 2,000 mg by mouth 2 (two) times  daily.     . Cholecalciferol (VITAMIN D-3) 5000 UNITS TABS Take 5,000 Units by mouth daily.     Marland Kitchen Cod Liver Oil 1000 MG CAPS Take 2,000 mg by mouth 2 (two) times daily.     . Coenzyme Q10 (COQ10) 200 MG CAPS Take 200 mg by mouth daily.    . Cyanocobalamin (VITAMIN B-12) 5000 MCG SUBL Place 5,000 mcg under the tongue daily.    . Digestive Enzymes (BETAINE HCL PO) Take 1 tablet by mouth every 3 (three) days.    Marland Kitchen diltiazem (CARDIZEM CD) 240 MG 24 hr capsule Take 1 capsule (240 mg total) by mouth daily. 30 capsule 11  . ferrous sulfate 325 (65 FE) MG tablet  Take 325 mg by mouth daily.    . flecainide (TAMBOCOR) 100 MG tablet Take 100 mg by mouth 2 (two) times daily.    . insulin lispro (HUMALOG) 100 UNIT/ML injection Basal rate 0.9 7am-12, 0.8 Noon-7am. Max of 60 units SQ every day with pump mer MD DX CODE: E10.65 (Patient taking differently: Insulin pump - Basal rate from 12 am-4:59 am at 0.45 units/hr, 5 am-6:59 am at 0.9 units/hr, 7 am-9:59 am at 1 unit/hr, 10 am-11:59 am at 1.1 units/hr, 12 pm-3:59 pm at 0.9 units/hr, 4 pm-8:59 at 0.8 units/hr, 9 pm-11:59 pm at 0.75 units/hr. Max of 100 units SQ every day; max of 50 units SQ in a 2 hr time frame with pump mer MD DX CODE: E10.65) 20 mL 6  . Magnesium 400 MG CAPS Take 400 mg by mouth daily.    . metoprolol tartrate (LOPRESSOR) 25 MG tablet Take 12.5 mg by mouth 2 (two) times daily. Pt can take an extra 1/2 tablet by mouth daily as needed for AFIB    . Misc Natural Products (GLUCOSAMINE CHONDROITIN MSM PO) Take 1 tablet by mouth daily.    . Misc Natural Products (PROGESTERONE EX) Apply 1 ml to chest daily    . NALTREXONE HCL PO Take 4.5 mg by mouth daily.    Marland Kitchen OVER THE COUNTER MEDICATION Take 1 tablet by mouth daily. DIM Supplement    . RESVERATROL PO Take 1 tablet by mouth daily.    Marland Kitchen thyroid (ARMOUR) 30 MG tablet Take 30 mg by mouth every other day.     . thyroid (ARMOUR) 60 MG tablet Take 60 mg by mouth every other day.    Marland Kitchen VITAMIN K PO Take 90 mcg by mouth 2 (two) times daily.    Marland Kitchen acidophilus (RISAQUAD) CAPS capsule Take 1 capsule by mouth daily. (Patient not taking: Reported on 03/18/2015) 10 capsule 0   No current facility-administered medications for this visit.   Facility-Administered Medications Ordered in Other Visits  Medication Dose Route Frequency Provider Last Rate Last Dose  . topical emolient (BIAFINE) emulsion   Topical Daily Eppie Gibson, MD        Allergies  Allergen Reactions  . Statins Other (See Comments)    Leg pain    History   Social History  . Marital Status:  Married    Spouse Name: N/A  . Number of Children: N/A  . Years of Education: N/A   Occupational History  . Not on file.   Social History Main Topics  . Smoking status: Never Smoker   . Smokeless tobacco: Not on file  . Alcohol Use: No  . Drug Use: No  . Sexual Activity: Yes   Other Topics Concern  . Not on file   Social History  Narrative    Family History  Problem Relation Age of Onset  . Breast cancer Mother 57  . Prostate cancer Maternal Uncle     died in his 37s  . Heart failure Mother   . Heart attack Father    The patientdoes not have a history of early familial atrial fibrillation or other arrhythmias.  ROS- All systems are reviewed and negative except as per the HPI above.  Physical Exam: Filed Vitals:   03/18/15 0912  BP: 120/78  Pulse: 100  Height: $Remove'5\' 4"'NewyLLJ$  (1.626 m)  Weight: 64.229 kg (141 lb 9.6 oz)    GEN- The patient is well appearing, alert and oriented x 3 today.   Head- normocephalic, atraumatic Eyes-  Sclera clear, conjunctiva pink Ears- hearing intact Oropharynx- clear Neck- supple, Lungs- Clear to ausculation bilaterally, normal work of breathing Heart- irregular rate and rhythm  GI- soft, NT, ND, + BS Extremities- no clubbing, cyanosis, or edema MS- no significant deformity or atrophy Skin- no rash or lesion Psych- euthymic mood, full affect Neuro- strength and sensation are intact  EKG EKG shows afib with V rate 100 bpm today  Echo Left ventricle: The cavity size was normal. Wall thickness was normal. Systolic function was normal. The estimated ejection fraction was in the range of 55% to 60%. Wall motion was normal; there were no regional wall motion abnormalities. Left ventricular diastolic function parameters were normal. - Pulmonary arteries: PA peak pressure: 39 mm Hg (S).  Nuclear stress test- 01/13/15- IMPRESSION: 1. No reversible ischemia or infarction.  2. Normal left ventricular wall motion.  3. Left  ventricular ejection fraction 71%  4. Low-risk stress test findings*.  Epic records are reviewed today  Assessment and Plan:  1. Atrial fibrillation/ atrial flutter The patient has Symptomatic paroxysmal atrial fibrillation. She also carries a diagnosis of atrial flutter.  The patients CHAD2VASC score is at least 3.  she is  appropriately anticoagulated at this time. She has failed medical therapy with diltiazem and flecainide. Therapeutic strategies for afib including medicine and ablation were discussed in detail with the patient today. Risk, benefits, and alternatives to EP study and radiofrequency ablation for afib were also discussed in detail today. These risks include but are not limited to stroke, bleeding, vascular damage, tamponade, perforation, damage to the esophagus, lungs, and other structures, pulmonary vein stenosis, worsening renal function, and death. The patient understands these risk and wishes to proceed.  We will therefore proceed with catheter ablation at the next available time.  2. Healthy lifestyle She already has a walking program and encouraged to continue on a regular basis.  3. snoring The importance of adequate treatment of sleep apnea was discussed today in order to improve our ability to maintain sinus rhythm long term. She is open to schedule sleep study and will be arranged.  Thompson Grayer MD, [facility] 03/18/2015 10:05 AM

## 2015-03-23 ENCOUNTER — Telehealth (HOSPITAL_COMMUNITY): Payer: Self-pay | Admitting: *Deleted

## 2015-03-23 NOTE — Telephone Encounter (Signed)
Patient called in today stating she was feeling very bad yesterday. HR was high and irregular all day was in bed because she felt so bad.  States HR was ranging from 120-140s and BP 90s/60s.  Today her HR is back to normal in the 60s and her BP is still 95/58.  States she did not take her metoprolol extra 1/2 dose for high irregular heart rate yesterday due to her BP being below 100.  Concerned about what she can do until her ablation the end of June.  Will discuss with [name], NP and call her back with suggestions.

## 2015-03-23 NOTE — Telephone Encounter (Signed)
Unfortunately cannot add any medications to patient when she goes into afib due to bp being soft.  Talked with patient encouraged her to take it easy on days she goes into afib until her procedure is performed.  If her BP is about 100 when she goes into afib she can take the extra half dose of metoprolol as prescribed. Patient verbalized understanding and will call back if she has another day of afib like yesterday.

## 2015-04-01 ENCOUNTER — Other Ambulatory Visit (INDEPENDENT_AMBULATORY_CARE_PROVIDER_SITE_OTHER): Payer: Medicare Other | Admitting: *Deleted

## 2015-04-01 DIAGNOSIS — I4891 Unspecified atrial fibrillation: Secondary | ICD-10-CM | POA: Diagnosis not present

## 2015-04-01 DIAGNOSIS — E785 Hyperlipidemia, unspecified: Secondary | ICD-10-CM

## 2015-04-01 DIAGNOSIS — I10 Essential (primary) hypertension: Secondary | ICD-10-CM | POA: Diagnosis not present

## 2015-04-01 DIAGNOSIS — I483 Typical atrial flutter: Secondary | ICD-10-CM | POA: Diagnosis not present

## 2015-04-01 LAB — CBC WITH DIFFERENTIAL/PLATELET
BASOS PCT: 0.4 % (ref 0.0–3.0)
Basophils Absolute: 0 10*3/uL (ref 0.0–0.1)
EOS PCT: 0.2 % (ref 0.0–5.0)
Eosinophils Absolute: 0 10*3/uL (ref 0.0–0.7)
HCT: 34.8 % — ABNORMAL LOW (ref 36.0–46.0)
HEMOGLOBIN: 11.6 g/dL — AB (ref 12.0–15.0)
Lymphocytes Relative: 12.6 % (ref 12.0–46.0)
Lymphs Abs: 0.8 10*3/uL (ref 0.7–4.0)
MCHC: 33.2 g/dL (ref 30.0–36.0)
MCV: 87.9 fl (ref 78.0–100.0)
MONOS PCT: 12.3 % — AB (ref 3.0–12.0)
Monocytes Absolute: 0.8 10*3/uL (ref 0.1–1.0)
NEUTROS ABS: 4.9 10*3/uL (ref 1.4–7.7)
Neutrophils Relative %: 74.5 % (ref 43.0–77.0)
Platelets: 317 10*3/uL (ref 150.0–400.0)
RBC: 3.96 Mil/uL (ref 3.87–5.11)
RDW: 13.3 % (ref 11.5–15.5)
WBC: 6.6 10*3/uL (ref 4.0–10.5)

## 2015-04-01 LAB — BASIC METABOLIC PANEL
BUN: 12 mg/dL (ref 6–23)
CO2: 28 meq/L (ref 19–32)
Calcium: 8.7 mg/dL (ref 8.4–10.5)
Chloride: 90 mEq/L — ABNORMAL LOW (ref 96–112)
Creatinine, Ser: 0.71 mg/dL (ref 0.40–1.20)
GFR: 86.31 mL/min (ref >60.00)
GLUCOSE: 225 mg/dL — AB (ref 70–99)
POTASSIUM: 4.5 meq/L (ref 3.5–5.1)
SODIUM: 125 meq/L — AB (ref 135–145)

## 2015-04-01 NOTE — Addendum Note (Signed)
Addended by: Eulis Foster on: 04/01/2015 08:44 AM   Modules accepted: Orders

## 2015-04-07 ENCOUNTER — Ambulatory Visit (HOSPITAL_COMMUNITY)
Admission: RE | Admit: 2015-04-07 | Discharge: 2015-04-07 | Disposition: A | Discharge status: Home / Self Care | Payer: Medicare Other | Source: Ambulatory Visit | Attending: Cardiovascular Disease | Admitting: Cardiovascular Disease

## 2015-04-07 ENCOUNTER — Encounter (HOSPITAL_COMMUNITY): Payer: Self-pay | Admitting: *Deleted

## 2015-04-07 ENCOUNTER — Encounter (HOSPITAL_COMMUNITY)
Admission: RE | Disposition: A | Discharge status: Home / Self Care | Payer: Self-pay | Source: Ambulatory Visit | Attending: Cardiovascular Disease

## 2015-04-07 ENCOUNTER — Ambulatory Visit (HOSPITAL_BASED_OUTPATIENT_CLINIC_OR_DEPARTMENT_OTHER)
Admission: RE | Admit: 2015-04-07 | Discharge: 2015-04-07 | Disposition: A | Discharge status: Home / Self Care | Payer: Medicare Other | Source: Ambulatory Visit | Attending: Cardiovascular Disease | Admitting: Cardiovascular Disease

## 2015-04-07 ENCOUNTER — Ambulatory Visit (HOSPITAL_BASED_OUTPATIENT_CLINIC_OR_DEPARTMENT_OTHER): Payer: Medicare Other

## 2015-04-07 DIAGNOSIS — I4891 Unspecified atrial fibrillation: Secondary | ICD-10-CM | POA: Insufficient documentation

## 2015-04-07 DIAGNOSIS — Z923 Personal history of irradiation: Secondary | ICD-10-CM | POA: Insufficient documentation

## 2015-04-07 DIAGNOSIS — I4892 Unspecified atrial flutter: Secondary | ICD-10-CM | POA: Diagnosis not present

## 2015-04-07 DIAGNOSIS — E039 Hypothyroidism, unspecified: Secondary | ICD-10-CM | POA: Insufficient documentation

## 2015-04-07 DIAGNOSIS — I313 Pericardial effusion (noninflammatory): Secondary | ICD-10-CM | POA: Diagnosis not present

## 2015-04-07 DIAGNOSIS — Z853 Personal history of malignant neoplasm of breast: Secondary | ICD-10-CM | POA: Diagnosis not present

## 2015-04-07 DIAGNOSIS — E109 Type 1 diabetes mellitus without complications: Secondary | ICD-10-CM | POA: Diagnosis not present

## 2015-04-07 DIAGNOSIS — I5032 Chronic diastolic (congestive) heart failure: Secondary | ICD-10-CM | POA: Diagnosis not present

## 2015-04-07 DIAGNOSIS — Z794 Long term (current) use of insulin: Secondary | ICD-10-CM | POA: Diagnosis not present

## 2015-04-07 DIAGNOSIS — E785 Hyperlipidemia, unspecified: Secondary | ICD-10-CM | POA: Insufficient documentation

## 2015-04-07 HISTORY — PX: TEE WITHOUT CARDIOVERSION: SHX5443

## 2015-04-07 LAB — GLUCOSE, CAPILLARY: Glucose-Capillary: 202 mg/dL — ABNORMAL HIGH (ref 65–99)

## 2015-04-07 SURGERY — ECHOCARDIOGRAM, TRANSESOPHAGEAL
Anesthesia: Moderate Sedation

## 2015-04-07 MED ORDER — FENTANYL CITRATE (PF) 100 MCG/2ML IJ SOLN
INTRAMUSCULAR | Status: AC
  Filled 2015-04-07: qty 2

## 2015-04-07 MED ORDER — SODIUM CHLORIDE 0.9 % IV SOLN: INTRAVENOUS | Status: DC

## 2015-04-07 MED ORDER — MIDAZOLAM HCL 10 MG/2ML IJ SOLN
INTRAMUSCULAR | Status: DC | PRN
  Administered 2015-04-07: 2 mg via INTRAVENOUS
  Administered 2015-04-07: 1 mg via INTRAVENOUS

## 2015-04-07 MED ORDER — BUTAMBEN-TETRACAINE-BENZOCAINE 2-2-14 % EX AERO
INHALATION_SPRAY | CUTANEOUS | Status: DC | PRN
  Administered 2015-04-07: 2 via TOPICAL

## 2015-04-07 MED ORDER — MIDAZOLAM HCL 5 MG/ML IJ SOLN
INTRAMUSCULAR | Status: AC
  Filled 2015-04-07: qty 2

## 2015-04-07 MED ORDER — FENTANYL CITRATE (PF) 100 MCG/2ML IJ SOLN
INTRAMUSCULAR | Status: DC | PRN
  Administered 2015-04-07 (×2): 25 ug via INTRAVENOUS

## 2015-04-07 NOTE — Discharge Instructions (Addendum)
Transesophageal Echocardiogram Transesophageal echocardiography (TEE) is a picture test of your heart using sound waves. The pictures taken can give very detailed pictures of your heart. This can help your doctor see if there are problems with your heart. TEE can check:  If your heart has blood clots in it.  How well your heart valves are working.  If you have an infection on the inside of your heart.  Some of the major arteries of your heart.  If your heart valve is working after a Office manager.  Your heart before a procedure that uses a shock to your heart to get the rhythm back to normal. BEFORE THE PROCEDURE  Do not eat or drink for 6 hours before the procedure or as told by your doctor.  Make plans to have someone drive you home after the procedure. Do not drive yourself home.  An IV tube will be put in your arm. PROCEDURE  You will be given a medicine to help you relax (sedative). It will be given through the IV tube.  A numbing medicine will be sprayed or gargled in the back of your throat to help numb it.  The tip of the probe is placed into the back of your mouth. You will be asked to swallow. This helps to pass the probe into your esophagus.  Once the tip of the probe is in the right place, your doctor can take pictures of your heart.  You may feel pressure at the back of your throat. AFTER THE PROCEDURE  You will be taken to a recovery area so the sedative can wear off.  Your throat may be sore and scratchy. This will go away slowly over time.  You will go home when you are fully awake and able to swallow liquids.  You should have someone stay with you for the next 24 hours.  Do not drive or operate machinery for the next 24 hours. Document Released: 07/29/2009 Document Revised: 10/06/2013 Document Reviewed: 04/02/2013 [facility] Patient Information 2015 [place], [place]. This information is not intended to replace advice given to you by your health care provider. Make  sure you discuss any questions you have with your health care provider. Cardiac Ablation  Cardiac ablation is a procedure to stop some heart tissue from causing problems. The heart has many electrical connections. Sometimes these connections cause the heart to beat very fast or irregularly. Removing some of the problem areas can improve heart rhythm or make it normal. Ablation is done for people who:   Have Wolff-Parkinson-White syndrome.  Have other fast heart rhythms (tachycardia).  Have taken medicines for an abnormal heart rhythm (arrhythmia) and the medicines had:  No success.  Side effects.  May have a type of heartbeat that could cause death. BEFORE THE PROCEDURE   Follow instructions from your doctor about eating and drinking before the procedure.  Take your medicines as told by your doctor. Take them at regular times with water unless told differently by your doctor.  If you are taking diabetes medicine, ask your doctor how to take it. Ask if there are any special instructions you should follow. Your doctor may change how much insulin you take the day of the procedure. PROCEDURE   A special type of X-ray will be used. The X-ray helps your doctor see images of your heart during the procedure.  A small cut (incision) will be made in your neck or groin.  An IV tube will be started before the procedure begins.  You  will be given a numbing medicine (anesthetic) or a medicine to help you relax (sedative).  The skin on your neck or groin will be numbed.  A needle will be put into a large vein in your neck or groin.  A thin, flexible tube (catheter) will be put in to reach your heart.  A dye will be put in the tube. The dye will show up on X-rays. It will help your doctor see the area of the heart that needs treatment.  When the heart tissue that is causing problems is found, the tip of the tube will send an electrical current to it. This will stop it from causing  problems.  The tube will be taken out.  Pressure will be put on the area where the tube was. This will keep it from bleeding. A bandage will be placed over the area. AFTER THE PROCEDURE  You will be taken to a recovery area. Your blood pressure, heart rate, and breathing will be watched. The area where the tube was will also be watched for bleeding.  You will need to lie still for 4-6 hours. This keeps the area where the tube was from bleeding. Document Released: 06/03/2013 Document Revised: 02/15/2014 Document Reviewed: 06/03/2013 [facility] Patient Information 2015 [place], [place]. This information is not intended to replace advice given to you by your health care provider. Make sure you discuss any questions you have with your health care provider.

## 2015-04-07 NOTE — CV Procedure (Addendum)
    Transesophageal Echocardiogram Note  Sam Wunschel [id number] 09/26/1946  Procedure: Transesophageal Echocardiogram Indications: atrial fib, pre- ablation    Procedure Details Consent: Obtained Time Out: Verified patient identification, verified procedure, site/side was marked, verified correct patient position, special equipment/implants available, Radiology Safety Procedures followed,  medications/allergies/relevent history reviewed, required imaging and test results available.  Performed  Medications: Fentanyl: 50 mcg iv  Versed: 3 mg iv   Left Ventrical:  Normal LV function   Mitral Valve: mild MR   Aortic Valve:  Normal AV   Tricuspid Valve:  Moderate TR   Pulmonic Valve:  Normal , trace PI   Left Atrium/ Left atrial appendage:  Normal LAA,  + spontaneous contrast ( "smoke")   Atrial septum: no PFO by color doppler   Aorta:  Mild calcification .   Pericardium:  Pt has a pericardial effusion.  Will get a TTE for further quantification of the pericardial effusion.    Complications: No apparent complications Patient did tolerate procedure well.   Thayer Headings, Brooke Bonito., MD, [facility] 04/07/2015, 9:30 AM

## 2015-04-07 NOTE — Interval H&P Note (Signed)
History and Physical Interval Note:  04/07/2015 8:53 AM  Gina [name]  has presented today for surgery, with the diagnosis of AFIB  The various methods of treatment have been discussed with the patient and family. After consideration of risks, benefits and other options for treatment, the patient has consented to  Procedure(s): TRANSESOPHAGEAL ECHOCARDIOGRAM (TEE) (N/A) as a surgical intervention .  The patient's history has been reviewed, patient examined, no change in status, stable for surgery.  I have reviewed the patient's chart and labs.  Questions were answered to the patient's satisfaction.     Gina [name], Wonda Cheng

## 2015-04-07 NOTE — Progress Notes (Signed)
Echocardiogram 2D Echocardiogram has been performed.  Gina [name] 04/07/2015, 9:48 AM

## 2015-04-07 NOTE — H&P (View-Only) (Signed)
Patient ID: Gina [name], female   DOB: 1946-07-19, 69 y.o.   MRN: [id number]         Patient ID: Gina [name], female   DOB: January 06, 1946, 69 y.o.   MRN: [id number]     Primary Care Physician: Odette Fraction, MD Primary Cardiologist: Dr. Debara Pickett Primary Electrophysiologist:Stephannie Broner Referring Physician: [facility] F/U   Gina [name] is a 69 y.o. female with a h/o Type 1 DM, h/o left breast ca with radiation/lumpectomy, paroxysmal atrial fibrillation who presents for EP follow-up after being seen recently by [name] NP in the AF clinic.  The patient was initially diagnosed with atrial fibrillation  after presenting with symptoms of dizziness and bilateral arm tingling at her PCP's office 3/29 with AFib with RVR and was admitted to [facility] for further treatment.  She has undergone a nuclear study which revealed normal EF and an echo with normal EF 55-60% with normal wall motion, trace MR/mild TR. Left atrial size 36 mm. She has been placed on flecainide/cardizem/toprol and eliquis 5mg  bid for cha2ds2vasc score of at least 3. CXR showed vascular congestion and was given lasix x one dose.  She reports increasing frequency and duration of atrial fibrillation despite flecainide 100mg  BId.  She feels "washed out" with her afib.  Her exercise tolerance is low.   Today, she denies symptoms of  chest pain, shortness of breath, orthopnea, PND, lower extremity edema, dizziness, presyncope, syncope. She does have h/o snoring with minimal daytime somnolence. Positive for fatigue and palpitations.No bleeding, or neurologic sequela. The patient is tolerating medications without difficulties and is otherwise without complaint today.    she has a BMI of Body mass index is 24.29 kg/(m^2).Marland Kitchen Filed Weights   03/18/15 0912  Weight: 64.229 kg (141 lb 9.6 oz)    LA size: 24mm   Atrial Fibrillation Management history:  Previous antiarrhythmic drugs: currently flecainide increased to 100 mg a  day(5/24)  Previous cardioversions: none  Previous ablations: none  CHADS2VASC score: at least 3  Anticoagulation history: eliquis   Past Medical History  Diagnosis Date  . Ovarian cyst   . Hypertension   . Insulin pump in place   . 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 Anxiety     new dx  . Hyperlipidemia   . S/P radiation therapy 04/10/12- 05/26/2012    left Breast and Axilla / 50 gy / 25 Fractions with Left Breast Boost / 10 Gy / 5 Fractions  . Use of letrozole (Femara) 06/13/12  . Diabetes mellitus type 1     Insulin pump  . Hypothyroidism   . Diastolic CHF     a. 01/6961 - due to AF RVR.  Marland Kitchen Atrial fibrillation    Past Surgical History  Procedure Laterality Date  . Tonsillectomy    . Ovarian cyst surgery    . Colonoscopy      1 polyp   . 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 Appendectomy      Current Outpatient Prescriptions  Medication Sig Dispense Refill  . apixaban (ELIQUIS) 5 MG TABS tablet Take 1 tablet (5 mg total) by mouth 2 (two) times daily. 60 tablet 11  . Ascorbic Acid (VITAMIN C) 1000 MG tablet Take 2,000 mg by mouth 2 (two) times  daily.     . Cholecalciferol (VITAMIN D-3) 5000 UNITS TABS Take 5,000 Units by mouth daily.     Marland Kitchen Cod Liver Oil 1000 MG CAPS Take 2,000 mg by mouth 2 (two) times daily.     . Coenzyme Q10 (COQ10) 200 MG CAPS Take 200 mg by mouth daily.    . Cyanocobalamin (VITAMIN B-12) 5000 MCG SUBL Place 5,000 mcg under the tongue daily.    . Digestive Enzymes (BETAINE HCL PO) Take 1 tablet by mouth every 3 (three) days.    Marland Kitchen diltiazem (CARDIZEM CD) 240 MG 24 hr capsule Take 1 capsule (240 mg total) by mouth daily. 30 capsule 11  . ferrous sulfate 325 (65 FE) MG tablet  Take 325 mg by mouth daily.    . flecainide (TAMBOCOR) 100 MG tablet Take 100 mg by mouth 2 (two) times daily.    . insulin lispro (HUMALOG) 100 UNIT/ML injection Basal rate 0.9 7am-12, 0.8 Noon-7am. Max of 60 units SQ every day with pump mer MD DX CODE: E10.65 (Patient taking differently: Insulin pump - Basal rate from 12 am-4:59 am at 0.45 units/hr, 5 am-6:59 am at 0.9 units/hr, 7 am-9:59 am at 1 unit/hr, 10 am-11:59 am at 1.1 units/hr, 12 pm-3:59 pm at 0.9 units/hr, 4 pm-8:59 at 0.8 units/hr, 9 pm-11:59 pm at 0.75 units/hr. Max of 100 units SQ every day; max of 50 units SQ in a 2 hr time frame with pump mer MD DX CODE: E10.65) 20 mL 6  . Magnesium 400 MG CAPS Take 400 mg by mouth daily.    . metoprolol tartrate (LOPRESSOR) 25 MG tablet Take 12.5 mg by mouth 2 (two) times daily. Pt can take an extra 1/2 tablet by mouth daily as needed for AFIB    . Misc Natural Products (GLUCOSAMINE CHONDROITIN MSM PO) Take 1 tablet by mouth daily.    . Misc Natural Products (PROGESTERONE EX) Apply 1 ml to chest daily    . NALTREXONE HCL PO Take 4.5 mg by mouth daily.    Marland Kitchen OVER THE COUNTER MEDICATION Take 1 tablet by mouth daily. DIM Supplement    . RESVERATROL PO Take 1 tablet by mouth daily.    Marland Kitchen thyroid (ARMOUR) 30 MG tablet Take 30 mg by mouth every other day.     . thyroid (ARMOUR) 60 MG tablet Take 60 mg by mouth every other day.    Marland Kitchen VITAMIN K PO Take 90 mcg by mouth 2 (two) times daily.    Marland Kitchen acidophilus (RISAQUAD) CAPS capsule Take 1 capsule by mouth daily. (Patient not taking: Reported on 03/18/2015) 10 capsule 0   No current facility-administered medications for this visit.   Facility-Administered Medications Ordered in Other Visits  Medication Dose Route Frequency Provider Last Rate Last Dose  . topical emolient (BIAFINE) emulsion   Topical Daily Eppie Gibson, MD        Allergies  Allergen Reactions  . Statins Other (See Comments)    Leg pain    History   Social History  . Marital Status:  Married    Spouse Name: N/A  . Number of Children: N/A  . Years of Education: N/A   Occupational History  . Not on file.   Social History Main Topics  . Smoking status: Never Smoker   . Smokeless tobacco: Not on file  . Alcohol Use: No  . Drug Use: No  . Sexual Activity: Yes   Other Topics Concern  . Not on file   Social History  Narrative    Family History  Problem Relation Age of Onset  . Breast cancer Mother 24  . Prostate cancer Maternal Uncle     died in his 93s  . Heart failure Mother   . Heart attack Father    The patientdoes not have a history of early familial atrial fibrillation or other arrhythmias.  ROS- All systems are reviewed and negative except as per the HPI above.  Physical Exam: Filed Vitals:   03/18/15 0912  BP: 120/78  Pulse: 100  Height: $Remove'5\' 4"'FFgRjzk$  (1.626 m)  Weight: 64.229 kg (141 lb 9.6 oz)    GEN- The patient is well appearing, alert and oriented x 3 today.   Head- normocephalic, atraumatic Eyes-  Sclera clear, conjunctiva pink Ears- hearing intact Oropharynx- clear Neck- supple, Lungs- Clear to ausculation bilaterally, normal work of breathing Heart- irregular rate and rhythm  GI- soft, NT, ND, + BS Extremities- no clubbing, cyanosis, or edema MS- no significant deformity or atrophy Skin- no rash or lesion Psych- euthymic mood, full affect Neuro- strength and sensation are intact  EKG EKG shows afib with V rate 100 bpm today  Echo Left ventricle: The cavity size was normal. Wall thickness was normal. Systolic function was normal. The estimated ejection fraction was in the range of 55% to 60%. Wall motion was normal; there were no regional wall motion abnormalities. Left ventricular diastolic function parameters were normal. - Pulmonary arteries: PA peak pressure: 39 mm Hg (S).  Nuclear stress test- 01/13/15- IMPRESSION: 1. No reversible ischemia or infarction.  2. Normal left ventricular wall motion.  3. Left  ventricular ejection fraction 71%  4. Low-risk stress test findings*.  Epic records are reviewed today  Assessment and Plan:  1. Atrial fibrillation/ atrial flutter The patient has Symptomatic paroxysmal atrial fibrillation. She also carries a diagnosis of atrial flutter.  The patients CHAD2VASC score is at least 3.  she is  appropriately anticoagulated at this time. She has failed medical therapy with diltiazem and flecainide. Therapeutic strategies for afib including medicine and ablation were discussed in detail with the patient today. Risk, benefits, and alternatives to EP study and radiofrequency ablation for afib were also discussed in detail today. These risks include but are not limited to stroke, bleeding, vascular damage, tamponade, perforation, damage to the esophagus, lungs, and other structures, pulmonary vein stenosis, worsening renal function, and death. The patient understands these risk and wishes to proceed.  We will therefore proceed with catheter ablation at the next available time.  2. Healthy lifestyle She already has a walking program and encouraged to continue on a regular basis.  3. snoring The importance of adequate treatment of sleep apnea was discussed today in order to improve our ability to maintain sinus rhythm long term. She is open to schedule sleep study and will be arranged.  Thompson Grayer MD, [facility] 03/18/2015 10:05 AM

## 2015-04-07 NOTE — Progress Notes (Signed)
*  PRELIMINARY RESULTS* Echocardiogram 2D Echocardiogram has been performed.  Leavy Cella 04/07/2015, 10:56 AM

## 2015-04-08 ENCOUNTER — Encounter (HOSPITAL_COMMUNITY): Payer: Self-pay | Admitting: Certified Registered"

## 2015-04-08 ENCOUNTER — Ambulatory Visit (HOSPITAL_COMMUNITY): Payer: Medicare Other | Admitting: Certified Registered"

## 2015-04-08 ENCOUNTER — Ambulatory Visit (HOSPITAL_COMMUNITY)
Admission: RE | Admit: 2015-04-08 | Discharge: 2015-04-09 | Disposition: A | Discharge status: Home / Self Care | Payer: Medicare Other | Source: Ambulatory Visit | Attending: Internal Medicine | Admitting: Internal Medicine

## 2015-04-08 ENCOUNTER — Encounter (HOSPITAL_COMMUNITY)
Admission: RE | Disposition: A | Discharge status: Home / Self Care | Payer: Self-pay | Source: Ambulatory Visit | Attending: Internal Medicine

## 2015-04-08 DIAGNOSIS — Z7901 Long term (current) use of anticoagulants: Secondary | ICD-10-CM | POA: Diagnosis not present

## 2015-04-08 DIAGNOSIS — I4891 Unspecified atrial fibrillation: Secondary | ICD-10-CM | POA: Diagnosis present

## 2015-04-08 DIAGNOSIS — E109 Type 1 diabetes mellitus without complications: Secondary | ICD-10-CM | POA: Diagnosis not present

## 2015-04-08 DIAGNOSIS — I481 Persistent atrial fibrillation: Secondary | ICD-10-CM | POA: Insufficient documentation

## 2015-04-08 DIAGNOSIS — E871 Hypo-osmolality and hyponatremia: Secondary | ICD-10-CM | POA: Diagnosis not present

## 2015-04-08 DIAGNOSIS — Z853 Personal history of malignant neoplasm of breast: Secondary | ICD-10-CM | POA: Diagnosis not present

## 2015-04-08 DIAGNOSIS — E785 Hyperlipidemia, unspecified: Secondary | ICD-10-CM | POA: Diagnosis not present

## 2015-04-08 DIAGNOSIS — I484 Atypical atrial flutter: Secondary | ICD-10-CM | POA: Insufficient documentation

## 2015-04-08 DIAGNOSIS — Z794 Long term (current) use of insulin: Secondary | ICD-10-CM | POA: Insufficient documentation

## 2015-04-08 DIAGNOSIS — E039 Hypothyroidism, unspecified: Secondary | ICD-10-CM | POA: Diagnosis present

## 2015-04-08 DIAGNOSIS — I4892 Unspecified atrial flutter: Secondary | ICD-10-CM | POA: Diagnosis not present

## 2015-04-08 DIAGNOSIS — I5032 Chronic diastolic (congestive) heart failure: Secondary | ICD-10-CM | POA: Diagnosis not present

## 2015-04-08 DIAGNOSIS — I10 Essential (primary) hypertension: Secondary | ICD-10-CM | POA: Diagnosis present

## 2015-04-08 HISTORY — PX: ELECTROPHYSIOLOGIC STUDY: SHX172A

## 2015-04-08 LAB — GLUCOSE, CAPILLARY
GLUCOSE-CAPILLARY: 82 mg/dL (ref 65–99)
Glucose-Capillary: 198 mg/dL — ABNORMAL HIGH (ref 65–99)
Glucose-Capillary: 78 mg/dL (ref 65–99)
Glucose-Capillary: 84 mg/dL (ref 65–99)
Glucose-Capillary: 207 mg/dL — ABNORMAL HIGH (ref 65–99)

## 2015-04-08 LAB — MRSA PCR SCREENING: MRSA by PCR: NEGATIVE

## 2015-04-08 LAB — POCT ACTIVATED CLOTTING TIME
ACTIVATED CLOTTING TIME: 313 s
Activated Clotting Time: 282 seconds
Activated Clotting Time: 294 seconds
Activated Clotting Time: 171 seconds

## 2015-04-08 SURGERY — ATRIAL FIBRILLATION ABLATION
Anesthesia: Monitor Anesthesia Care

## 2015-04-08 MED ORDER — ACETAMINOPHEN 325 MG PO TABS
650.0000 mg | ORAL_TABLET | ORAL | Status: DC | PRN
  Administered 2015-04-08 (×2): 650 mg via ORAL
  Filled 2015-04-08 (×2): qty 2

## 2015-04-08 MED ORDER — PHENYLEPHRINE HCL 10 MG/ML IJ SOLN
10.0000 mg | INTRAVENOUS | Status: DC | PRN
  Administered 2015-04-08: 15 ug/min via INTRAVENOUS

## 2015-04-08 MED ORDER — BUPIVACAINE HCL (PF) 0.25 % IJ SOLN
INTRAMUSCULAR | Status: DC | PRN
  Administered 2015-04-08: 20 mL

## 2015-04-08 MED ORDER — SODIUM CHLORIDE 0.9 % IV SOLN
INTRAVENOUS | Status: DC | PRN
  Administered 2015-04-08: 08:00:00 via INTRAVENOUS

## 2015-04-08 MED ORDER — HYDROCODONE-ACETAMINOPHEN 5-325 MG PO TABS
1.0000 | ORAL_TABLET | ORAL | Status: DC | PRN
  Administered 2015-04-08: 1 via ORAL
  Filled 2015-04-08: qty 1

## 2015-04-08 MED ORDER — SODIUM CHLORIDE 0.9 % IV SOLN: 250.0000 mL | INTRAVENOUS | Status: DC | PRN

## 2015-04-08 MED ORDER — FLECAINIDE ACETATE 100 MG PO TABS
100.0000 mg | ORAL_TABLET | Freq: Two times a day (BID) | ORAL | Status: DC
  Filled 2015-04-08: qty 1

## 2015-04-08 MED ORDER — IOHEXOL 350 MG/ML SOLN
INTRAVENOUS | Status: DC | PRN
  Administered 2015-04-08: 1 mL via INTRACARDIAC
  Administered 2015-04-08: 100 mL via INTRACARDIAC

## 2015-04-08 MED ORDER — ONDANSETRON HCL 4 MG/2ML IJ SOLN
INTRAMUSCULAR | Status: AC
  Filled 2015-04-08: qty 2

## 2015-04-08 MED ORDER — HEPARIN SODIUM (PORCINE) 1000 UNIT/ML IJ SOLN
INTRAMUSCULAR | Status: AC
  Filled 2015-04-08: qty 1

## 2015-04-08 MED ORDER — SODIUM CHLORIDE 0.9 % IJ SOLN: 3.0000 mL | INTRAMUSCULAR | Status: DC | PRN

## 2015-04-08 MED ORDER — FLECAINIDE ACETATE 100 MG PO TABS
100.0000 mg | ORAL_TABLET | Freq: Every day | ORAL | Status: DC
  Administered 2015-04-09: 100 mg via ORAL
  Filled 2015-04-08: qty 1

## 2015-04-08 MED ORDER — FLECAINIDE ACETATE 50 MG PO TABS
50.0000 mg | ORAL_TABLET | Freq: Every day | ORAL | Status: DC
  Administered 2015-04-08: 50 mg via ORAL
  Filled 2015-04-08 (×2): qty 1

## 2015-04-08 MED ORDER — SODIUM CHLORIDE 0.9 % IV SOLN
INTRAVENOUS | Status: DC
  Administered 2015-04-08: 06:00:00 via INTRAVENOUS

## 2015-04-08 MED ORDER — BUPIVACAINE HCL (PF) 0.25 % IJ SOLN
INTRAMUSCULAR | Status: AC
  Filled 2015-04-08: qty 30

## 2015-04-08 MED ORDER — FENTANYL CITRATE (PF) 100 MCG/2ML IJ SOLN: 25.0000 ug | INTRAMUSCULAR | Status: DC | PRN

## 2015-04-08 MED ORDER — PROTAMINE SULFATE 10 MG/ML IV SOLN
INTRAVENOUS | Status: DC | PRN
  Administered 2015-04-08: 10 mg via INTRAVENOUS
  Administered 2015-04-08: 20 mg via INTRAVENOUS

## 2015-04-08 MED ORDER — PROPOFOL INFUSION 10 MG/ML OPTIME
INTRAVENOUS | Status: DC | PRN
  Administered 2015-04-08: 50 ug/kg/min via INTRAVENOUS

## 2015-04-08 MED ORDER — HEPARIN SODIUM (PORCINE) 1000 UNIT/ML IJ SOLN
INTRAMUSCULAR | Status: DC | PRN
  Administered 2015-04-08: 10000 [IU] via INTRAVENOUS

## 2015-04-08 MED ORDER — PHENYLEPHRINE HCL 10 MG/ML IJ SOLN
INTRAMUSCULAR | Status: DC | PRN
  Administered 2015-04-08 (×4): 80 ug via INTRAVENOUS

## 2015-04-08 MED ORDER — APIXABAN 5 MG PO TABS
5.0000 mg | ORAL_TABLET | Freq: Two times a day (BID) | ORAL | Status: DC
  Administered 2015-04-08 – 2015-04-09 (×2): 5 mg via ORAL
  Filled 2015-04-08 (×3): qty 1

## 2015-04-08 MED ORDER — SODIUM CHLORIDE 0.9 % IJ SOLN
3.0000 mL | Freq: Two times a day (BID) | INTRAMUSCULAR | Status: DC
  Administered 2015-04-08 (×2): 3 mL via INTRAVENOUS

## 2015-04-08 MED ORDER — HEPARIN SODIUM (PORCINE) 1000 UNIT/ML IJ SOLN
INTRAMUSCULAR | Status: DC | PRN
  Administered 2015-04-08 (×2): 3000 [IU] via INTRAVENOUS
  Administered 2015-04-08: 10000 [IU] via INTRAVENOUS

## 2015-04-08 MED ORDER — ONDANSETRON HCL 4 MG/2ML IJ SOLN
4.0000 mg | Freq: Four times a day (QID) | INTRAMUSCULAR | Status: DC | PRN
  Administered 2015-04-08: 4 mg via INTRAVENOUS

## 2015-04-08 MED ORDER — THYROID 60 MG PO TABS
60.0000 mg | ORAL_TABLET | ORAL | Status: DC
  Administered 2015-04-09: 60 mg via ORAL
  Filled 2015-04-08: qty 1

## 2015-04-08 MED ORDER — THYROID 30 MG PO TABS: 30.0000 mg | ORAL_TABLET | ORAL | Status: DC

## 2015-04-08 MED ORDER — METOPROLOL TARTRATE 12.5 MG HALF TABLET
12.5000 mg | ORAL_TABLET | Freq: Two times a day (BID) | ORAL | Status: DC
  Administered 2015-04-08 – 2015-04-09 (×3): 12.5 mg via ORAL
  Filled 2015-04-08 (×4): qty 1

## 2015-04-08 MED ORDER — DILTIAZEM HCL ER COATED BEADS 240 MG PO CP24
240.0000 mg | ORAL_CAPSULE | Freq: Every day | ORAL | Status: DC
  Administered 2015-04-08 – 2015-04-09 (×2): 240 mg via ORAL
  Filled 2015-04-08 (×2): qty 1

## 2015-04-08 MED ORDER — HYDROCODONE-ACETAMINOPHEN 7.5-325 MG PO TABS: 1.0000 | ORAL_TABLET | Freq: Once | ORAL | Status: DC | PRN

## 2015-04-08 MED ORDER — FENTANYL CITRATE (PF) 100 MCG/2ML IJ SOLN
INTRAMUSCULAR | Status: DC | PRN
  Administered 2015-04-08: 50 ug via INTRAVENOUS
  Administered 2015-04-08 (×3): 25 ug via INTRAVENOUS

## 2015-04-08 MED ORDER — MIDAZOLAM HCL 5 MG/5ML IJ SOLN
INTRAMUSCULAR | Status: DC | PRN
  Administered 2015-04-08 (×2): 1 mg via INTRAVENOUS

## 2015-04-08 MED ORDER — FERROUS SULFATE 325 (65 FE) MG PO TABS
325.0000 mg | ORAL_TABLET | Freq: Every day | ORAL | Status: DC
  Administered 2015-04-09: 325 mg via ORAL
  Filled 2015-04-08 (×2): qty 1

## 2015-04-08 MED ORDER — INSULIN PUMP
Freq: Three times a day (TID) | SUBCUTANEOUS | Status: DC
  Administered 2015-04-08: 1.9 via SUBCUTANEOUS
  Filled 2015-04-08: qty 1

## 2015-04-08 SURGICAL SUPPLY — 22 items
BAG SNAP BAND KOVER 36X36 (MISCELLANEOUS) ×3 IMPLANT
BLANKET WARM UNDERBOD FULL ACC (MISCELLANEOUS) ×3 IMPLANT
CATH DIAG 6FR PIGTAIL (CATHETERS) ×3 IMPLANT
CATH NAVISTAR SMARTTOUCH DF (ABLATOR) ×3 IMPLANT
CATH SOUNDSTAR 3D IMAGING (CATHETERS) ×3 IMPLANT
CATH VARIABLE LASSO NAV 2515 (CATHETERS) ×2 IMPLANT
CATH WEBSTER BI DIR CS D-F CRV (CATHETERS) ×3 IMPLANT
COVER SWIFTLINK CONNECTOR (BAG) ×3 IMPLANT
NDL TRANSEP BRK 71CM 407200 (NEEDLE) ×1 IMPLANT
NEEDLE TRANSEP BRK 71CM 407200 (NEEDLE) ×3 IMPLANT
PACK EP LATEX FREE (CUSTOM PROCEDURE TRAY) ×3
PACK EP LF (CUSTOM PROCEDURE TRAY) ×1 IMPLANT
PAD DEFIB LIFELINK (PAD) ×3 IMPLANT
PATCH CARTO3 (PAD) ×3 IMPLANT
SHEATH AVANTI 11F 11CM (SHEATH) ×3 IMPLANT
SHEATH PINNACLE 7F 10CM (SHEATH) ×6 IMPLANT
SHEATH PINNACLE 9F 10CM (SHEATH) ×3 IMPLANT
SHEATH SWARTZ TS SL2 63CM 8.5F (SHEATH) ×3 IMPLANT
SHIELD RADPAD SCOOP 12X17 (MISCELLANEOUS) ×3 IMPLANT
SYR MEDRAD MARK V 150ML (SYRINGE) ×3 IMPLANT
TUBING CONTRAST HIGH PRESS 48 (TUBING) ×3 IMPLANT
TUBING SMART ABLATE COOLFLOW (TUBING) ×3 IMPLANT

## 2015-04-08 NOTE — Progress Notes (Addendum)
[place] for Help with home insulin and other medications   Allergies  Allergen Reactions  . Statins Other (See Comments)    Leg pain    Patient Measurements: Height: 5\' 4"  (162.6 cm) Weight: 134 lb (60.782 kg) IBW/kg (Calculated) : 54.7   Vital Signs: Temp: 98.1 F (36.7 C) (06/24 1316) Temp Source: Oral (06/24 1316) BP: 133/65 mmHg (06/24 1430) Pulse Rate: 76 (06/24 1430) Intake/Output from previous day:   Intake/Output from this shift: Total I/O In: 900 [I.V.:900] Out: -   Labs: No results for input(s): WBC, HGB, PLT, LABCREA, CREATININE in the last 72 hours. Estimated Creatinine Clearance: 56.5 mL/min (by C-G formula based on Cr of 0.71). No results for input(s): VANCOTROUGH, VANCOPEAK, VANCORANDOM, GENTTROUGH, GENTPEAK, GENTRANDOM, TOBRATROUGH, TOBRAPEAK, TOBRARND, AMIKACINPEAK, AMIKACINTROU, AMIKACIN in the last 72 hours.   Microbiology: No results found for this or any previous visit (from the past 720 hour(s)).  Medical History: Past Medical History  Diagnosis Date  . Ovarian cyst   . Hypertension   . Insulin pump in place   . 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 Anxiety     new dx  . Hyperlipidemia   . S/P radiation therapy 04/10/12- 05/26/2012    left Breast and Axilla / 50 gy / 25 Fractions with Left Breast Boost / 10 Gy / 5 Fractions  . Use of letrozole (Femara) 06/13/12  . Diabetes mellitus type 1     Insulin pump  . Hypothyroidism   . Diastolic CHF     a. 02/8849 - due to AF RVR.  Marland Kitchen Atrial fibrillation     Medications:  Prescriptions prior to admission  Medication Sig Dispense Refill Last Dose  . apixaban (ELIQUIS) 5 MG TABS tablet Take 1 tablet (5 mg total) by mouth 2 (two) times daily. 60 tablet 11 04/08/2015 at 0500  . Ascorbic Acid (VITAMIN C) 1000 MG tablet Take 2,000 mg by mouth 2 (two) times daily.     Past Week at Unknown time  . Cholecalciferol (VITAMIN D-3) 5000 UNITS TABS Take 5,000 Units by mouth daily.    Past Week at Unknown time  . Cod Liver Oil 1000 MG CAPS Take 2,000 mg by mouth 2 (two) times daily.    Past Week at Unknown time  . Coenzyme Q10 (COQ10) 200 MG CAPS Take 200 mg by mouth daily.   Past Week at Unknown time  . Cyanocobalamin (VITAMIN B-12) 5000 MCG SUBL Place 5,000 mcg under the tongue daily.   Past Week at Unknown time  . Digestive Enzymes (BETAINE HCL PO) Take 1 tablet by mouth every 3 (three) days.   Past Week at Unknown time  . diltiazem (CARDIZEM CD) 240 MG 24 hr capsule Take 1 capsule (240 mg total) by mouth daily. 30 capsule 11 04/07/2015 at Unknown time  . ferrous sulfate 325 (65 FE) MG tablet Take 325 mg by mouth daily.   Past Week at Unknown time  . flecainide (TAMBOCOR) 50 MG tablet Take 50-100 mg by mouth 2 (two) times daily. Takes 100mg  in am and 50mg  in pm   04/07/2015 at Unknown time  . insulin lispro (HUMALOG) 100 UNIT/ML injection Basal rate 0.9 7am-12, 0.8 Noon-7am. Max of 60 units SQ every day with pump mer MD DX CODE: E10.65 (Patient taking differently: Insulin pump - Basal rate from 12 am-4:59 am at 0.45 units/hr, 5 am-6:59 am at  0.9 units/hr, 7 am-9:59 am at 1 unit/hr, 10 am-11:59 am at 1.1 units/hr, 12 pm-3:59 pm at 0.9 units/hr, 4 pm-8:59 at 0.8 units/hr, 9 pm-11:59 pm at 0.75 units/hr. Max of 100 units SQ every day; max of 50 units SQ in a 2 hr time frame with pump mer MD DX CODE: E10.65) 20 mL 6 04/08/2015 at Unknown time  . Magnesium 400 MG CAPS Take 400 mg by mouth daily.   Past Week at Unknown time  . metoprolol tartrate (LOPRESSOR) 25 MG tablet Take 12.5 mg by mouth 2 (two) times daily. Pt can take an extra 1/2 tablet by mouth daily as needed for AFIB   04/07/2015 at Unknown time  . Misc Natural Products (GLUCOSAMINE CHONDROITIN MSM PO) Take 1 tablet by mouth daily.   Past Week at Unknown time  . Misc Natural Products (PROGESTERONE EX) Apply 1 ml to chest  daily   04/08/2015 at Unknown time  . NALTREXONE HCL PO Take 4.5 mg by mouth daily.   04/08/2015 at Unknown time  . OVER THE COUNTER MEDICATION Take 1 tablet by mouth daily. DIM Supplement   Past Week at Unknown time  . RESVERATROL PO Take 1 tablet by mouth daily.   Past Week at Unknown time  . thyroid (ARMOUR) 30 MG tablet Take 30 mg by mouth every other day.    04/08/2015 at 0500  . thyroid (ARMOUR) 60 MG tablet Take 60 mg by mouth every other day.   04/07/2015 at Unknown time  . VITAMIN K PO Take 90 mcg by mouth 2 (two) times daily.   Past Week at Unknown time    Assessment: 69 yo female with afib and s/p ablation today. Pharmacy has been asked to assist with home insulin and other medications. Home medication as listed above (diltiazem, apixiban, metoprolol, and flecainide have been ordered per MD)  -She is noted on low dose Naltrexone: per patient this is provided by [facility]. She states she will not need this while admitted -flecainide ordered as 100mg  po bid and patient reports she takes 100mg  in am and 50mg  in pm: Spoke with Chanetta Marshall NP and will change to home regimen -She is noted on several vitamins and other supplements : will hold off on ordering vitamins and natural products  -Insulin pump orders have been entered (see the Inpatient Diabetes Service note)  Patient's family reports that at home they place her pills in capsules (obtained from [employer]) and then have her take the capsule. According to the patient she does this because the pills taste bad. I made the patient aware that this may decrease absorption and efficacy. I discouraged continuing this practice but the patient does not want to make any changes   Plan:  -Change flecainide to 100mg  qam and 50mg  qpm -Restart home ferrous sulfate and thyroid replacement -Will sign off, please call pharmacy with any other needs  Thank you for asking pharmacy to be involved in the care of this patient. Hildred Laser, Pharm D  04/08/2015 3:35 PM

## 2015-04-08 NOTE — Anesthesia Preprocedure Evaluation (Addendum)
Anesthesia Evaluation  Patient identified by MRN, date of birth, ID band Patient awake    Reviewed: Allergy & Precautions, NPO status   Airway Mallampati: I  TM Distance: >3 FB Neck ROM: Full    Dental  (+) Teeth Intact, Dental Advisory Given   Pulmonary neg pulmonary ROS,  breath sounds clear to auscultation        Cardiovascular hypertension, Rhythm:Irregular Rate:Normal     Neuro/Psych negative neurological ROS     GI/Hepatic   Endo/Other  diabetesHypothyroidism   Renal/GU      Musculoskeletal   Abdominal   Peds  Hematology  (+) anemia ,   Anesthesia Other Findings   Reproductive/Obstetrics                           Anesthesia Physical Anesthesia Plan  ASA: III  Anesthesia Plan: MAC   Post-op Pain Management:    Induction: Intravenous  Airway Management Planned:   Additional Equipment:   Intra-op Plan:   Post-operative Plan: Extubation in OR  Informed Consent: I have reviewed the patients History and Physical, chart, labs and discussed the procedure including the risks, benefits and alternatives for the proposed anesthesia with the patient or authorized representative who has indicated his/her understanding and acceptance.   Dental advisory given  Plan Discussed with: CRNA and Surgeon  Anesthesia Plan Comments:         Anesthesia Quick Evaluation

## 2015-04-08 NOTE — Progress Notes (Signed)
Inpatient Diabetes Program Recommendations  AACE/ADA: New Consensus Statement on Inpatient Glycemic Control (2013)  Target Ranges:  Prepandial:   less than 140 mg/dL      Peak postprandial:   less than 180 mg/dL (1-2 hours)      Critically ill patients:  140 - 180 mg/dL   Results for Gina [name], Gina [name] (MRN [id number]) as of 04/08/2015 13:40  Ref. Range 04/08/2015 06:15 04/08/2015 11:51 04/08/2015 12:26  Glucose-Capillary Latest Ref Range: 65-99 mg/dL 198 (H) 78 84    Diabetes history: DM1 Outpatient Diabetes medications: Insulin pump with Humalog Current orders for Inpatient glycemic control: None   Note: Spoke with Antony Madura, RN and she confirms that patient has insulin pump on at this time. Since patient has been admitted and will continue to use her insulin pump as an inpatient for glycemic control, please order Insulin Pump order set with CBGs ACHS&2am. NURSING: Please print out the patient insulin pump contract and flowsheet for the patient to sign and fill out. Also please complete Insulin Pump Flowsheet at least once a shift. The patient is to complete the paper flowsheet which will be used to chart in the [facility] when documenting insulin boluses (for meal coverage and correction). Will continue to follow.  Thanks, Barnie Alderman, RN, MSN, CCRN, CDE Diabetes Coordinator Inpatient Diabetes Program [phone] (Team Pager from [place] to [place]) [phone] ([facility] office) [phone] [facility] office) [phone] [facility] office)

## 2015-04-08 NOTE — Anesthesia Postprocedure Evaluation (Signed)
  Anesthesia Post-op Note  Patient: Gina [name]  Procedure(s) Performed: Procedure(s): Atrial Fibrillation Ablation (N/A)  Patient Location: PACU and Cath Lab  Anesthesia Type:MAC  Level of Consciousness: awake and alert   Airway and Oxygen Therapy: Patient Spontanous Breathing  Post-op Pain: none  Post-op Assessment: Post-op Vital signs reviewed              Post-op Vital Signs: stable  Last Vitals:  Filed Vitals:   04/08/15 1210  BP: 97/54  Pulse: 71  Temp:   Resp: 18    Complications: No apparent anesthesia complications

## 2015-04-08 NOTE — H&P (View-Only) (Signed)
Patient ID: Gina [name], female   DOB: April 29, 1946, 69 y.o.   MRN: [id number]         Patient ID: Gina [name], female   DOB: 1946/07/07, 69 y.o.   MRN: [id number]     Primary Care Physician: Odette Fraction, MD Primary Cardiologist: Dr. Debara Pickett Primary Electrophysiologist:Haifa Hatton Referring Physician: [facility] F/U   Gina [name] is a 69 y.o. female with a h/o Type 1 DM, h/o left breast ca with radiation/lumpectomy, paroxysmal atrial fibrillation who presents for EP follow-up after being seen recently by [name] NP in the AF clinic.  The patient was initially diagnosed with atrial fibrillation  after presenting with symptoms of dizziness and bilateral arm tingling at her PCP's office 3/29 with AFib with RVR and was admitted to [facility] for further treatment.  She has undergone a nuclear study which revealed normal EF and an echo with normal EF 55-60% with normal wall motion, trace MR/mild TR. Left atrial size 36 mm. She has been placed on flecainide/cardizem/toprol and eliquis 5mg  bid for cha2ds2vasc score of at least 3. CXR showed vascular congestion and was given lasix x one dose.  She reports increasing frequency and duration of atrial fibrillation despite flecainide 100mg  BId.  She feels "washed out" with her afib.  Her exercise tolerance is low.   Today, she denies symptoms of  chest pain, shortness of breath, orthopnea, PND, lower extremity edema, dizziness, presyncope, syncope. She does have h/o snoring with minimal daytime somnolence. Positive for fatigue and palpitations.No bleeding, or neurologic sequela. The patient is tolerating medications without difficulties and is otherwise without complaint today.    she has a BMI of Body mass index is 24.29 kg/(m^2).Marland Kitchen Filed Weights   03/18/15 0912  Weight: 64.229 kg (141 lb 9.6 oz)    LA size: 35mm   Atrial Fibrillation Management history:  Previous antiarrhythmic drugs: currently flecainide increased to 100 mg a  day(5/24)  Previous cardioversions: none  Previous ablations: none  CHADS2VASC score: at least 3  Anticoagulation history: eliquis   Past Medical History  Diagnosis Date  . Ovarian cyst   . Hypertension   . Insulin pump in place   . 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 Anxiety     new dx  . Hyperlipidemia   . S/P radiation therapy 04/10/12- 05/26/2012    left Breast and Axilla / 50 gy / 25 Fractions with Left Breast Boost / 10 Gy / 5 Fractions  . Use of letrozole (Femara) 06/13/12  . Diabetes mellitus type 1     Insulin pump  . Hypothyroidism   . Diastolic CHF     a. 0/7371 - due to AF RVR.  Marland Kitchen Atrial fibrillation    Past Surgical History  Procedure Laterality Date  . Tonsillectomy    . Ovarian cyst surgery    . Colonoscopy      1 polyp   . 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 Appendectomy      Current Outpatient Prescriptions  Medication Sig Dispense Refill  . apixaban (ELIQUIS) 5 MG TABS tablet Take 1 tablet (5 mg total) by mouth 2 (two) times daily. 60 tablet 11  . Ascorbic Acid (VITAMIN C) 1000 MG tablet Take 2,000 mg by mouth 2 (two) times  daily.     . Cholecalciferol (VITAMIN D-3) 5000 UNITS TABS Take 5,000 Units by mouth daily.     Marland Kitchen Cod Liver Oil 1000 MG CAPS Take 2,000 mg by mouth 2 (two) times daily.     . Coenzyme Q10 (COQ10) 200 MG CAPS Take 200 mg by mouth daily.    . Cyanocobalamin (VITAMIN B-12) 5000 MCG SUBL Place 5,000 mcg under the tongue daily.    . Digestive Enzymes (BETAINE HCL PO) Take 1 tablet by mouth every 3 (three) days.    Marland Kitchen diltiazem (CARDIZEM CD) 240 MG 24 hr capsule Take 1 capsule (240 mg total) by mouth daily. 30 capsule 11  . ferrous sulfate 325 (65 FE) MG tablet  Take 325 mg by mouth daily.    . flecainide (TAMBOCOR) 100 MG tablet Take 100 mg by mouth 2 (two) times daily.    . insulin lispro (HUMALOG) 100 UNIT/ML injection Basal rate 0.9 7am-12, 0.8 Noon-7am. Max of 60 units SQ every day with pump mer MD DX CODE: E10.65 (Patient taking differently: Insulin pump - Basal rate from 12 am-4:59 am at 0.45 units/hr, 5 am-6:59 am at 0.9 units/hr, 7 am-9:59 am at 1 unit/hr, 10 am-11:59 am at 1.1 units/hr, 12 pm-3:59 pm at 0.9 units/hr, 4 pm-8:59 at 0.8 units/hr, 9 pm-11:59 pm at 0.75 units/hr. Max of 100 units SQ every day; max of 50 units SQ in a 2 hr time frame with pump mer MD DX CODE: E10.65) 20 mL 6  . Magnesium 400 MG CAPS Take 400 mg by mouth daily.    . metoprolol tartrate (LOPRESSOR) 25 MG tablet Take 12.5 mg by mouth 2 (two) times daily. Pt can take an extra 1/2 tablet by mouth daily as needed for AFIB    . Misc Natural Products (GLUCOSAMINE CHONDROITIN MSM PO) Take 1 tablet by mouth daily.    . Misc Natural Products (PROGESTERONE EX) Apply 1 ml to chest daily    . NALTREXONE HCL PO Take 4.5 mg by mouth daily.    Marland Kitchen OVER THE COUNTER MEDICATION Take 1 tablet by mouth daily. DIM Supplement    . RESVERATROL PO Take 1 tablet by mouth daily.    Marland Kitchen thyroid (ARMOUR) 30 MG tablet Take 30 mg by mouth every other day.     . thyroid (ARMOUR) 60 MG tablet Take 60 mg by mouth every other day.    Marland Kitchen VITAMIN K PO Take 90 mcg by mouth 2 (two) times daily.    Marland Kitchen acidophilus (RISAQUAD) CAPS capsule Take 1 capsule by mouth daily. (Patient not taking: Reported on 03/18/2015) 10 capsule 0   No current facility-administered medications for this visit.   Facility-Administered Medications Ordered in Other Visits  Medication Dose Route Frequency Provider Last Rate Last Dose  . topical emolient (BIAFINE) emulsion   Topical Daily Eppie Gibson, MD        Allergies  Allergen Reactions  . Statins Other (See Comments)    Leg pain    History   Social History  . Marital Status:  Married    Spouse Name: N/A  . Number of Children: N/A  . Years of Education: N/A   Occupational History  . Not on file.   Social History Main Topics  . Smoking status: Never Smoker   . Smokeless tobacco: Not on file  . Alcohol Use: No  . Drug Use: No  . Sexual Activity: Yes   Other Topics Concern  . Not on file   Social History  Narrative    Family History  Problem Relation Age of Onset  . Breast cancer Mother 57  . Prostate cancer Maternal Uncle     died in his 40s  . Heart failure Mother   . Heart attack Father    The patientdoes not have a history of early familial atrial fibrillation or other arrhythmias.  ROS- All systems are reviewed and negative except as per the HPI above.  Physical Exam: Filed Vitals:   03/18/15 0912  BP: 120/78  Pulse: 100  Height: $Remove'5\' 4"'GvaENrj$  (1.626 m)  Weight: 64.229 kg (141 lb 9.6 oz)    GEN- The patient is well appearing, alert and oriented x 3 today.   Head- normocephalic, atraumatic Eyes-  Sclera clear, conjunctiva pink Ears- hearing intact Oropharynx- clear Neck- supple, Lungs- Clear to ausculation bilaterally, normal work of breathing Heart- irregular rate and rhythm  GI- soft, NT, ND, + BS Extremities- no clubbing, cyanosis, or edema MS- no significant deformity or atrophy Skin- no rash or lesion Psych- euthymic mood, full affect Neuro- strength and sensation are intact  EKG EKG shows afib with V rate 100 bpm today  Echo Left ventricle: The cavity size was normal. Wall thickness was normal. Systolic function was normal. The estimated ejection fraction was in the range of 55% to 60%. Wall motion was normal; there were no regional wall motion abnormalities. Left ventricular diastolic function parameters were normal. - Pulmonary arteries: PA peak pressure: 39 mm Hg (S).  Nuclear stress test- 01/13/15- IMPRESSION: 1. No reversible ischemia or infarction.  2. Normal left ventricular wall motion.  3. Left  ventricular ejection fraction 71%  4. Low-risk stress test findings*.  Epic records are reviewed today  Assessment and Plan:  1. Atrial fibrillation/ atrial flutter The patient has Symptomatic paroxysmal atrial fibrillation. She also carries a diagnosis of atrial flutter.  The patients CHAD2VASC score is at least 3.  she is  appropriately anticoagulated at this time. She has failed medical therapy with diltiazem and flecainide. Therapeutic strategies for afib including medicine and ablation were discussed in detail with the patient today. Risk, benefits, and alternatives to EP study and radiofrequency ablation for afib were also discussed in detail today. These risks include but are not limited to stroke, bleeding, vascular damage, tamponade, perforation, damage to the esophagus, lungs, and other structures, pulmonary vein stenosis, worsening renal function, and death. The patient understands these risk and wishes to proceed.  We will therefore proceed with catheter ablation at the next available time.  2. Healthy lifestyle She already has a walking program and encouraged to continue on a regular basis.  3. snoring The importance of adequate treatment of sleep apnea was discussed today in order to improve our ability to maintain sinus rhythm long term. She is open to schedule sleep study and will be arranged.  Thompson Grayer MD, [facility] 03/18/2015 10:05 AM

## 2015-04-08 NOTE — Discharge Summary (Signed)
ELECTROPHYSIOLOGY PROCEDURE DISCHARGE SUMMARY    Patient ID: Gina [name],  MRN: [id number], DOB/AGE: 1946-04-09 69 y.o.  Admit date: 04/08/2015 Discharge date: 04/09/2015  Primary Care Physician: Odette Fraction, MD Primary Cardiologist: [place] Electrophysiologist: Thompson Grayer, MD  Primary Discharge Diagnosis:  Paroxysmal atrial fibrillation and atrial flutter status post ablation this admission  Secondary Discharge Diagnosis:  1.  Hypertension 2.  Diabetes 3.  Prior breast cancer s/p radiation 4.  Diastolic heart failure 5.  Hypothyroidism  Procedures This Admission:  1.  Electrophysiology study and radiofrequency catheter ablation on 04/08/15 by Dr Thompson Grayer.  This study demonstrated sinus rhythm upon presentation; rotational Angiography reveals a moderate sized left atrium with four separate pulmonary veins without evidence of pulmonary vein stenosis; successful electrical isolation and anatomical encircling of all four pulmonary veins with radiofrequency current. A WACA approach was used. 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    Brief HPI: Gina [name] is a 69 y.o. female  with a history of paroxysmal atrial fibrillation and atrial flutter.  They have failed medical therapy with Flecainide. Risks, benefits, and alternatives to catheter ablation of atrial fibrillation were reviewed with the patient who wished to proceed.  The patient underwent TEE prior to the procedure which demonstrated small to moderate pericardial effusion, normal LV function and no LAA thrombus.    Hospital Course:  The patient was admitted and underwent EPS/RFCA of atrial fibrillation with details  as outlined above.  They were monitored on telemetry overnight which demonstrated sinus rhythm with transient afib/ pacs.  Groin was without complication on the day of discharge.  The patient was examined and considered to be stable for discharge.  Wound care and restrictions were reviewed with the patient.  The patient will be seen back by [name], NP in 4 weeks and [name] in 12 weeks for post ablation follow up.   This patients CHA2DS2-VASc Score and unadjusted Ischemic Stroke Rate (% per year) is equal to 4.8 % stroke rate/year from a score of 4 Above score calculated as 1 point each if present [CHF, HTN, DM, Vascular=MI/PAD/Aortic Plaque, Age if 65-74, or Female] Above score calculated as 2 points each if present [Age > 75, or Stroke/TIA/TE]   Physical Exam: Filed Vitals:   04/09/15 0600 04/09/15 0700 04/09/15 0756 04/09/15 0800  BP:   118/61 118/61  Pulse:      Temp:   98.6 F (37 C)   TempSrc:   Oral   Resp: 24 28 25 20   Height:      Weight:      SpO2:   94%     GEN- The patient is well appearing, alert and oriented x 3 today.   HEENT: normocephalic, atraumatic; sclera clear, conjunctiva pink; hearing intact; oropharynx clear; neck supple, no JVP Lymph- no cervical lymphadenopathy Lungs- Clear to ausculation bilaterally, normal work of breathing.  No wheezes, rales, rhonchi Heart- Regular rate and rhythm, no murmurs, rubs or gallops, PMI not laterally displaced GI- soft, non-tender, non-distended, bowel sounds present, no hepatosplenomegaly Extremities- no clubbing, cyanosis, or edema; DP/PT/radial pulses 2+ bilaterally, groin without hematoma/bruit MS- no significant deformity or atrophy Skin- warm and dry, no rash or lesion Psych- euthymic mood, full affect Neuro- strength and sensation are  intact   Labs:   Lab Results  Component Value Date   WBC 6.6 04/01/2015   HGB 11.6* 04/01/2015   HCT 34.8* 04/01/2015   MCV 87.9 04/01/2015   PLT 317.0 04/01/2015      Recent Labs Lab 04/09/15 0230  NA 130*  K 4.0  CL 95*  CO2 26  BUN 8  CREATININE 0.64  CALCIUM 7.8*  GLUCOSE 87     Discharge Medications:    Medication List    STOP taking these medications        VITAMIN K PO      TAKE these medications        apixaban 5 MG Tabs tablet  Commonly known as:  ELIQUIS  Take 1 tablet (5 mg total) by mouth 2 (two) times daily.     BETAINE HCL PO  Take 1 tablet by mouth every 3 (three) days.     Cod Liver Oil 1000 MG Caps  Take 2,000 mg by mouth 2 (two) times daily.     CoQ10 200 MG Caps  Take 200 mg by mouth daily.     diltiazem 240 MG 24 hr capsule  Commonly known as:  CARDIZEM CD  Take 1 capsule (240 mg total) by mouth daily.     ferrous sulfate 325 (65 FE) MG tablet  Take 325 mg by mouth daily.     flecainide 50 MG tablet  Commonly known as:  TAMBOCOR  Take 50-100 mg by mouth 2 (two) times daily. Takes 100mg  in am and 50mg  in pm     insulin lispro 100 UNIT/ML injection  Commonly known as:  HUMALOG  Basal rate 0.9 7am-12, 0.8 Noon-7am. Max of 60 units SQ every day with pump mer MD DX CODE: E10.65     Magnesium 400 MG Caps  Take 400 mg by mouth daily.     metoprolol tartrate 25 MG tablet  Commonly known as:  LOPRESSOR  Take 12.5 mg by mouth 2 (two) times daily. Pt can take an extra 1/2 tablet by mouth daily as needed for AFIB     NALTREXONE HCL PO  Take 4.5 mg by mouth daily.     OVER THE COUNTER MEDICATION  Take 1 tablet by mouth daily. DIM Supplement     pantoprazole 40 MG tablet  Commonly known as:  PROTONIX  Take 1 tablet (40 mg total) by mouth daily.     PROGESTERONE EX  Apply 1 ml to chest daily     GLUCOSAMINE CHONDROITIN MSM PO  Take 1 tablet by mouth daily.     RESVERATROL PO  Take 1 tablet by mouth daily.     thyroid 60 MG tablet  Commonly known as:  ARMOUR  Take 60 mg by mouth every other day.     thyroid 30 MG tablet  Commonly known as:  ARMOUR  Take 30 mg by mouth every other day.      Vitamin B-12 5000 MCG Subl  Place 5,000 mcg under the tongue daily.     vitamin C 1000 MG tablet  Take 2,000 mg by mouth 2 (two) times daily.     Vitamin D-3 5000 UNITS Tabs  Take 5,000 Units by mouth daily.        Disposition:   Follow-up Information    Follow up with [facility] On 05/06/2015.   Why:  at 9:45AM   Contact information:   [place] [address] [phone]  Follow up with Thompson Grayer, MD On 07/11/2015.   Specialty:  Cardiology   Why:  at 10:15AM   Contact information:   [place] [address] [phone]     She is instructed to follow-up with primary care for hyponatremia evaluation.  Duration of Discharge Encounter: Greater than 30 minutes including physician time.  Army Fossa MD, [facility] 04/09/2015 9:31 AM

## 2015-04-08 NOTE — Discharge Instructions (Addendum)
No driving for 4 days.  No lifting over 5 lbs for 1 week. No sexual activity for 1 week. You may return to work in 1 week. Keep procedure site clean & dry. If you notice increased pain, swelling, bleeding or pus, call/return!  You may shower, but no soaking baths/hot tubs/pools for 1 week.     Information on my medicine - ELIQUIS (apixaban)  This medication education was reviewed with me or my healthcare representative as part of my discharge preparation.  The pharmacist that spoke with me during my hospital stay was:  Dareen Piano, [facility]  Why was Eliquis prescribed for you? Eliquis was prescribed for you to reduce the risk of a blood clot forming that can cause a stroke if you have a medical condition called atrial fibrillation (a type of irregular heartbeat).  What do You need to know about Eliquis ? Take your Eliquis TWICE DAILY - one tablet in the morning and one tablet in the evening with or without food. If you have difficulty swallowing the tablet whole please discuss with your pharmacist how to take the medication safely.  Take Eliquis exactly as prescribed by your doctor and DO NOT stop taking Eliquis without talking to the doctor who prescribed the medication.  Stopping may increase your risk of developing a stroke.  Refill your prescription before you run out.  After discharge, you should have regular check-up appointments with your healthcare provider that is prescribing your Eliquis.  In the future your dose may need to be changed if your kidney function or weight changes by a significant amount or as you get older.  What do you do if you miss a dose? If you miss a dose, take it as soon as you remember on the same day and resume taking twice daily.  Do not take more than one dose of ELIQUIS at the same time to make up a missed dose.  Important Safety Information A possible side effect of Eliquis is bleeding. You should call your healthcare provider right away if you  experience any of the following: ? Bleeding from an injury or your nose that does not stop. ? Unusual colored urine (red or dark brown) or unusual colored stools (red or black). ? Unusual bruising for unknown reasons. ? A serious fall or if you hit your head (even if there is no bleeding).  Some medicines may interact with Eliquis and might increase your risk of bleeding or clotting while on Eliquis. To help avoid this, consult your healthcare provider or pharmacist prior to using any new prescription or non-prescription medications, including herbals, vitamins, non-steroidal anti-inflammatory drugs (NSAIDs) and supplements.  This website has more information on Eliquis (apixaban): http://www.eliquis.com/eliquis/home

## 2015-04-08 NOTE — Interval H&P Note (Signed)
History and Physical Interval Note:  04/08/2015 7:28 AM  Gina [name]  has presented today for surgery, with the diagnosis of afib  The various methods of treatment have been discussed with the patient and family. After consideration of risks, benefits and other options for treatment, the patient has consented to  Procedure(s): Atrial Fibrillation Ablation (N/A) as a surgical intervention .  The patient's history has been reviewed, patient examined, no change in status, stable for surgery.  I have reviewed the patient's chart and labs.  Questions were answered to the patient's satisfaction.     Thompson Grayer

## 2015-04-08 NOTE — Transfer of Care (Signed)
Immediate Anesthesia Transfer of Care Note  Patient: Gina [name]  Procedure(s) Performed: Procedure(s): Atrial Fibrillation Ablation (N/A)  Patient Location: Cath Lab  Anesthesia Type:MAC  Level of Consciousness: awake, oriented and sedated  Airway & Oxygen Therapy: Patient Spontanous Breathing and Patient connected to face mask oxygen  Post-op Assessment: Report given to RN, Post -op Vital signs reviewed and stable and Patient moving all extremities X 4  Post vital signs: Reviewed and stable  Last Vitals:  Filed Vitals:   04/08/15 0615  BP: 119/64  Pulse: 75  Temp: 36.7 C  Resp: 14    Complications: No apparent anesthesia complications

## 2015-04-08 NOTE — Progress Notes (Signed)
Site area: RFV x 3 Site Prior to Removal:  Level 0 Pressure Applied For:8min Manualyes:    Patient Status During Pull:stable   Post Pull Site:  Level 0 Post Pull Instructions Given:  yes Post Pull Pulses Present: palpable Dressing Applied:  yes Bedrest begins @ 1230 Comments:

## 2015-04-09 ENCOUNTER — Telehealth: Payer: Self-pay | Admitting: Nurse Practitioner

## 2015-04-09 ENCOUNTER — Encounter (HOSPITAL_COMMUNITY): Payer: Self-pay | Admitting: Nurse Practitioner

## 2015-04-09 DIAGNOSIS — Z794 Long term (current) use of insulin: Secondary | ICD-10-CM | POA: Diagnosis not present

## 2015-04-09 DIAGNOSIS — I481 Persistent atrial fibrillation: Secondary | ICD-10-CM | POA: Diagnosis not present

## 2015-04-09 DIAGNOSIS — E109 Type 1 diabetes mellitus without complications: Secondary | ICD-10-CM | POA: Diagnosis not present

## 2015-04-09 DIAGNOSIS — I484 Atypical atrial flutter: Secondary | ICD-10-CM | POA: Diagnosis not present

## 2015-04-09 LAB — GLUCOSE, CAPILLARY: GLUCOSE-CAPILLARY: 131 mg/dL — AB (ref 65–99)

## 2015-04-09 LAB — BASIC METABOLIC PANEL
Anion gap: 9 (ref 5–15)
BUN: 8 mg/dL (ref 6–20)
CALCIUM: 7.8 mg/dL — AB (ref 8.9–10.3)
CHLORIDE: 95 mmol/L — AB (ref 101–111)
CO2: 26 mmol/L (ref 22–32)
Creatinine, Ser: 0.64 mg/dL (ref 0.44–1.00)
GFR calc Af Amer: >60 mL/min (ref >60)
GFR calc non Af Amer: >60 mL/min (ref >60)
Glucose, Bld: 87 mg/dL (ref 65–99)
POTASSIUM: 4 mmol/L (ref 3.5–5.1)
Sodium: 130 mmol/L — ABNORMAL LOW (ref 135–145)

## 2015-04-09 MED ORDER — PANTOPRAZOLE SODIUM 40 MG PO TBEC
40.0000 mg | DELAYED_RELEASE_TABLET | Freq: Every day | ORAL | Status: DC
  Administered 2015-04-09: 40 mg via ORAL
  Filled 2015-04-09: qty 1

## 2015-04-09 MED ORDER — PANTOPRAZOLE SODIUM 40 MG PO TBEC: 40.0000 mg | DELAYED_RELEASE_TABLET | Freq: Every day | ORAL | Status: DC

## 2015-04-09 NOTE — Progress Notes (Signed)
   Patient Name: Gina [name] Date of Encounter: 04/09/2015    Principal Problem:   A-fib Active Problems:   Essential hypertension   Diabetes type 1, controlled   Dyslipidemia   Hypothyroidism   Chronic anticoagulation-Eliquis    SUBJECTIVE  Maintaining sinus.  Frequent pac's and brief runs of a-tach.  Doesn't have much energy this AM.  Didn't sleep well - bed uncomfortable.    CURRENT MEDS . apixaban  5 mg Oral BID  . diltiazem  240 mg Oral Daily  . ferrous sulfate  325 mg Oral Q breakfast  . flecainide  100 mg Oral Daily  . flecainide  50 mg Oral QHS  . insulin pump   Subcutaneous TID AC, HS, 0200  . metoprolol tartrate  12.5 mg Oral BID  . sodium chloride  3 mL Intravenous Q12H  . [START ON 04/10/2015] thyroid  30 mg Oral QODAY  . thyroid  60 mg Oral QODAY    OBJECTIVE  Filed Vitals:   04/09/15 0600 04/09/15 0700 04/09/15 0756 04/09/15 0800  BP:   118/61 118/61  Pulse:      Temp:   98.6 F (37 C)   TempSrc:   Oral   Resp: 24 28 25 20   Height:      Weight:      SpO2:   94%     Intake/Output Summary (Last 24 hours) at 04/09/15 0906 Last data filed at 04/09/15 0900  Gross per 24 hour  Intake   1830 ml  Output      0 ml  Net   1830 ml   Filed Weights   04/08/15 0615  Weight: 134 lb (60.782 kg)    PHYSICAL EXAM  General: Pleasant, NAD. Neuro: Alert and oriented X 3. Moves all extremities spontaneously. Psych: Normal affect. HEENT:  Normal  Neck: Supple without bruits or JVD. Lungs:  Resp regular and unlabored, CTA. Heart: RRR no s3, s4, or murmurs. Abdomen: Soft, non-tender, non-distended, BS + x 4.  Extremities: No clubbing, cyanosis or edema. DP/PT/Radials 2+ and equal bilaterally.  R groin site w/o bleeding/bruit/hematoma.  Accessory Clinical Findings  Basic Metabolic Panel  Recent Labs  04/09/15 0230  NA 130*  K 4.0  CL 95*  CO2 26  GLUCOSE 87  BUN 8  CREATININE 0.64  CALCIUM 7.8*   TELE  Sinus rhythm, freq pac's and brief  runs of atrial tach.  ECG  Rsr, 76, antlat twi.  Radiology/Studies  No results found.  ASSESSMENT AND PLAN  1.  Afib:  S/p EP study and RFCA yesterday.  In sinus overnight with frequent PAC's and brief runs of atrial tach.  Dr. Rayann Heman to see this am.  Cont flecainide, dilt, bb, eliquis.  Add PPI.  F/U arranged in AF clinic for 7/22.  Of note, upon review of home meds, she is taking Vit K 67mcg PO daily (OTC formulation) - will d/w Dr. Rayann Heman.  2.  Essential HTN:  Stable on bb/dilt.  3.  Hypothyroidism:  Nl TSH in March.  Cont current dose of Armour Thyroid.  4.  Dyslipidemia:  Refuses to take statins.  Uses a homeopathic agent given to her by homeopathic provider.  Signed, Murray Hodgkins NP  I have seen, examined the patient, and reviewed the above assessment and plan. On exam, RRR.  Changes to above are made where necessary.  DC to home today.  Co Sign: Thompson Grayer, MD 04/09/2015 9:22 AM

## 2015-04-09 NOTE — Telephone Encounter (Signed)
   Pts husband called to report that over the past 2 hrs, Gina [name] has complained of intermittent dyspnea - a sensation like she can't get a good breath.  No c/p.  I advised that she come into the ED for evaluation, cxr, labs, etc, however Mr. Wisman reports that she has been having similar intermittent Ss for several weeks and they'd prefer to wait and see if she gets any worse.  I again advised that they should have a low threshold to come into the ED for eval.  Caller verbalized understanding and was grateful for the call back.  Murray Hodgkins, NP 04/09/2015, 3:07 PM

## 2015-04-11 LAB — GLUCOSE, CAPILLARY: Glucose-Capillary: 96 mg/dL (ref 65–99)

## 2015-04-12 ENCOUNTER — Telehealth: Payer: Self-pay | Admitting: Family Medicine

## 2015-04-12 NOTE — Telephone Encounter (Signed)
Husband calling.  Wife just had cardiac procedure.  Was told sodium was low and needs to follow up with PCP.  appt made with PCP for 04/25/15.  Husband asked if she can drink Gatorade, that would be fine.  Cut back on fluid intake a little.

## 2015-04-14 ENCOUNTER — Other Ambulatory Visit (INDEPENDENT_AMBULATORY_CARE_PROVIDER_SITE_OTHER): Payer: Medicare Other

## 2015-04-14 DIAGNOSIS — IMO0002 Reserved for concepts with insufficient information to code with codable children: Secondary | ICD-10-CM

## 2015-04-14 DIAGNOSIS — E7800 Pure hypercholesterolemia, unspecified: Secondary | ICD-10-CM

## 2015-04-14 DIAGNOSIS — E1065 Type 1 diabetes mellitus with hyperglycemia: Secondary | ICD-10-CM | POA: Diagnosis not present

## 2015-04-14 LAB — LIPID PANEL
Cholesterol: 154 mg/dL (ref 0–200)
HDL: 26.9 mg/dL — ABNORMAL LOW (ref >39.00)
LDL Cholesterol: 114 mg/dL — ABNORMAL HIGH (ref 0–99)
NonHDL: 127.1
Total CHOL/HDL Ratio: 6
Triglycerides: 66 mg/dL (ref 0.0–149.0)
VLDL: 13.2 mg/dL (ref 0.0–40.0)

## 2015-04-14 LAB — MICROALBUMIN / CREATININE URINE RATIO
Creatinine,U: 131.1 mg/dL
Microalb Creat Ratio: 0.6 mg/g (ref 0.0–30.0)
Microalb, Ur: 0.8 mg/dL (ref 0.0–1.9)

## 2015-04-14 LAB — BASIC METABOLIC PANEL
BUN: 11 mg/dL (ref 6–23)
CALCIUM: 8.8 mg/dL (ref 8.4–10.5)
CO2: 33 mEq/L — ABNORMAL HIGH (ref 19–32)
Chloride: 94 mEq/L — ABNORMAL LOW (ref 96–112)
Creatinine, Ser: 0.71 mg/dL (ref 0.40–1.20)
GFR: 86.3 mL/min (ref >60.00)
Glucose, Bld: 181 mg/dL — ABNORMAL HIGH (ref 70–99)
POTASSIUM: 3.7 meq/L (ref 3.5–5.1)
Sodium: 134 mEq/L — ABNORMAL LOW (ref 135–145)

## 2015-04-14 LAB — HEMOGLOBIN A1C: Hgb A1c MFr Bld: 8 % — ABNORMAL HIGH (ref 4.6–6.5)

## 2015-04-15 ENCOUNTER — Telehealth (HOSPITAL_COMMUNITY): Payer: Self-pay | Admitting: Nurse Practitioner

## 2015-04-15 NOTE — Telephone Encounter (Signed)
Gina [name] called in c/o of feeling weak. She felt weak before ablation. Even thought she is improving since the ablation, she feels like she is over medicated. Has already reduced her flecainide to 50 mg at bedtime continuing 100 mg in am. Has not had any afib since procedure.  She can try reducing flecainde to 50 mg bid and if no afib, after a couple days of this dose, can stop metoprolol and continue cardizem. If she has any afib will have to resume prior doses. She also did not think sleep study was neccessary since she had ablation but was told very important to get this study because untreated afib will undermine success of the ablation and contribute to afib burden.

## 2015-04-19 ENCOUNTER — Ambulatory Visit (INDEPENDENT_AMBULATORY_CARE_PROVIDER_SITE_OTHER): Payer: Medicare Other | Admitting: Endocrinology

## 2015-04-19 ENCOUNTER — Ambulatory Visit: Payer: Medicare Other | Admitting: Endocrinology

## 2015-04-19 ENCOUNTER — Encounter: Payer: Self-pay | Admitting: Endocrinology

## 2015-04-19 VITALS — BP 130/78 | HR 91 | Temp 97.8°F | Resp 14 | Ht 64.0 in | Wt 134.6 lb

## 2015-04-19 DIAGNOSIS — E1065 Type 1 diabetes mellitus with hyperglycemia: Secondary | ICD-10-CM

## 2015-04-19 DIAGNOSIS — E871 Hypo-osmolality and hyponatremia: Secondary | ICD-10-CM | POA: Diagnosis not present

## 2015-04-19 DIAGNOSIS — E038 Other specified hypothyroidism: Secondary | ICD-10-CM | POA: Diagnosis not present

## 2015-04-19 DIAGNOSIS — IMO0002 Reserved for concepts with insufficient information to code with codable children: Secondary | ICD-10-CM

## 2015-04-19 DIAGNOSIS — E063 Autoimmune thyroiditis: Secondary | ICD-10-CM

## 2015-04-19 NOTE — Patient Instructions (Signed)
Change settings as discussed

## 2015-04-19 NOTE — Progress Notes (Signed)
Patient ID: Gina [name], female   DOB: 27-Apr-1946, 69 y.o.   MRN: [id number]   Reason for Appointment: Insulin Pump followup:   History of Present Illness   Diagnosis: Type 1 DIABETES MELITUS, date of diagnosis:  1984     CURRENT insulin pump:  One Touch Ping, has been on a pump since 1995  HISTORY:  She has had overall fair control of her diabetes with insulin pump   The pump SETTINGS are: Midnight = 0.45, 5 AM = 0.9, 7 AM = 1.0; 10 AM = 1.1, 12 noon = 0.9, 4 PM = 0.8 and 9 PM = 0.75. Carbohydrate ratio 1:8.  Blood sugar target 110   Previous A1c has been below 7 the last 2 times but is now significantly higher at 8% Basal rates were not changed on her last visit However blood sugars have been significantly higher since she has had problems with her atrial fibrillation and ablation procedure She did not call to report high readings or change her settings  Current blood sugar patterns and problems identified:   Fasting blood sugars are mostly high especially in the last week or so.  Highest blood sugars are in the mornings and late evenings  She had much higher blood sugars for 3 days after her ablation procedure on 6/24  Postprandial readings are variable at times with some high readings after lunch and supper.  Last night blood sugar was higher because of higher fat meals but she could not estimate the carbohydrates 4.  She is still eating very little carbohydrate at breakfast and mostly taking a correction bolus, occasionally eating about 20 g of carbs in the mornings  She is not able to consistently get her blood sugar down to normal with correction boluses, checking blood sugars fairly often now  No hypoglycemia recently  GLUCOSE CONTROL with the pump is assessed today by pump download.     PRE-MEAL Fasting Lunch Dinner Bedtime Overall  Glucose range:  121-233    119-212   83-221  114-279   Mean/median:  202   169   178   190  185    Pump management:  She is bolusing 4.8 times a day, average carbohydrate intake 43  g daily  EXERCISE:  None recently   Wt Readings from Last 3 Encounters:  04/19/15 134 lb 9.6 oz (61.054 kg)  04/08/15 134 lb (60.782 kg)  03/18/15 141 lb 9.6 oz (64.229 kg)    LABS:   A1c 6.8 from outside lab on 09/13/14  Lab Results  Component Value Date   HGBA1C 8.0* 04/14/2015   HGBA1C 7.1* 01/11/2015   HGBA1C 7.2* 01/09/2015   Lab Results  Component Value Date   MICROALBUR 0.8 04/14/2015   LDLCALC 114* 04/14/2015   CREATININE 0.71 04/14/2015       Medication List       This list is accurate as of: 04/19/15  8:45 AM.  Always use your most recent med list.               apixaban 5 MG Tabs tablet  Commonly known as:  ELIQUIS  Take 1 tablet (5 mg total) by mouth 2 (two) times daily.     BETAINE HCL PO  Take 1 tablet by mouth every 3 (three) days.     Cod Liver Oil 1000 MG Caps  Take 2,000 mg by mouth 2 (two) times daily.     CoQ10 200 MG Caps  Take 200 mg by  mouth daily.     diltiazem 240 MG 24 hr capsule  Commonly known as:  CARDIZEM CD  Take 1 capsule (240 mg total) by mouth daily.     ferrous sulfate 325 (65 FE) MG tablet  Take 325 mg by mouth daily.     flecainide 50 MG tablet  Commonly known as:  TAMBOCOR  Take 50 mg by mouth 2 (two) times daily. Takes $RemoveBefo'100mg'VkdHipBrZKt$  in am and $Remo'50mg'wxxzP$  in pm     insulin lispro 100 UNIT/ML injection  Commonly known as:  HUMALOG  Basal rate 0.9 7am-12, 0.8 Noon-7am. Max of 60 units SQ every day with pump mer MD DX CODE: E10.65     Magnesium 400 MG Caps  Take 400 mg by mouth daily.     metoprolol tartrate 25 MG tablet  Commonly known as:  LOPRESSOR  Take 12.5 mg by mouth 2 (two) times daily. Pt can take an extra 1/2 tablet by mouth daily as needed for AFIB     NALTREXONE HCL PO  Take 4.5 mg by mouth daily.     OVER THE COUNTER MEDICATION  Take 1 tablet by mouth daily. DIM Supplement     pantoprazole 40 MG tablet  Commonly known as:  PROTONIX  Take 1  tablet (40 mg total) by mouth daily.     PROGESTERONE EX  Apply 1 ml to chest daily     GLUCOSAMINE CHONDROITIN MSM PO  Take 1 tablet by mouth daily.     RESVERATROL PO  Take 1 tablet by mouth daily.     thyroid 60 MG tablet  Commonly known as:  ARMOUR  Take 60 mg by mouth every other day.     thyroid 30 MG tablet  Commonly known as:  ARMOUR  Take 30 mg by mouth every other day.     Vitamin B-12 5000 MCG Subl  Place 5,000 mcg under the tongue daily.     vitamin C 1000 MG tablet  Take 2,000 mg by mouth 2 (two) times daily.     Vitamin D-3 5000 UNITS Tabs  Take 5,000 Units by mouth daily.        Allergies:  Allergies  Allergen Reactions  . Statins Other (See Comments)    Leg pain    Past Medical History  Diagnosis Date  . Ovarian cyst   . Hypertension   . Insulin pump in place   . 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 Anxiety     new dx  . Hyperlipidemia   . S/P radiation therapy 04/10/12- 05/26/2012    left Breast and Axilla / 50 gy / 25 Fractions with Left Breast Boost / 10 Gy / 5 Fractions  . Use of letrozole (Femara) 06/13/12  . Diabetes mellitus type 1     Insulin pump  . Hypothyroidism   . Diastolic CHF     a. 05/5461 - due to AF RVR;  b. 03/2015 Echo: EF 60-65%, no rwma.  . Atrial fibrillation     a. 03/2015 s/p RFCA;  b. CHA2DS2VASc = 5-->Eliquis.    Past Surgical History  Procedure Laterality Date  . Tonsillectomy    . Ovarian cyst surgery    . Colonoscopy      1 polyp   . 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 Appendectomy    . Tee without cardioversion N/A 04/07/2015    Procedure: TRANSESOPHAGEAL ECHOCARDIOGRAM (TEE);  Surgeon: Thayer Headings, MD;  Location: [place];   Service: Cardiovascular;  Laterality: N/A;  . Electrophysiologic study N/A 04/08/2015    Procedure: Atrial Fibrillation Ablation;  Surgeon: Thompson Grayer, MD;  Location: [place] [facility];  Service: Cardiovascular;  Laterality: N/A;    Family History  Problem Relation Age of Onset  . Breast cancer Mother 77  . Prostate cancer Maternal Uncle     died in his 54s  . Heart failure Mother   . Heart attack Father     Social History:  reports that she has never smoked. She does not have any smokeless tobacco history on file. She reports that she does not drink alcohol or use illicit drugs.  REVIEW of systems:   She has a history of mild hypothyroidism followed by PCP   Lab Results  Component Value Date   TSH 1.449 01/11/2015    HYPERCHOLESTEROLEMIA: Her LDL has been persistently high and refuses to take a statin drug including pravastatin even though it has had no side effects previously She was told by her wellness  physician that she does not meet treatment because her particle size is normal  However her LDL particle number is consistently high and now about the same at 1621  Previous LDL of 161  from outside lab LDL is better now especially with her significant weight loss, HDL is also low She is trying to use red yeast rice   Lab Results  Component Value Date   CHOL 154 04/14/2015   HDL 26.90* 04/14/2015   LDLCALC 114* 04/14/2015   LDLDIRECT 148.4 08/06/2013   TRIG 66.0 04/14/2015   CHOLHDL 6 04/14/2015   . Also is taking treatment for atrial fibrillation  EXAM:  BP 130/78 mmHg  Pulse 91  Temp(Src) 97.8 F (36.6 C)  Resp 14  Ht $R'5\' 4"'kv$  (1.626 m)  Wt 134 lb 9.6 oz (61.054 kg)  BMI 23.09 kg/m2  SpO2 95%   ASSESSMENT:  DIABETES: Her blood sugars are much higher in the last 2-3 months because of her stress with the atrial fibrillation Despite her weight loss and decreased appetite she has had higher readings and has not changed her diet composition Appears to be  more insulin resistant for now A1c is higher than usual at 8% See history of present illness for detailed discussion of current blood sugar levels, abnormal patterns, problems identified  Her blood sugars are mostly higher waking up and also later in the evenings and also periodically higher during the day Is also somewhat more insulin resistant with needing to get extra boluses for taking care of higher readings which is unusual for her Also no hypoglycemia which she is to have especially with her increased activity Currently unable to do much physical activity  For now she will change her settings as follows 5 AM = 1.0, 6 PM = 0.9 until 10 PM Correction factor 1: 30 Use temporary basal for persistently high readings  HYPERTENSION:  Fairly well controlled   HYPERLIPIDEMIA: Will need to follow-up her lipids on her next visit, recent LDL appears to be lower from intercurrent illness  HYPONATREMIA: Appears to be resolving and not clear of the etiology  Hypothyroidism: To check labs on her next visit  Counseling time on subjects discussed above is over 50% of today's 25 minute visit  Patient Instructions  Change settings as discussed     Kaizley Aja 04/19/2015, 8:45 AM

## 2015-04-25 ENCOUNTER — Encounter: Payer: Self-pay | Admitting: Family Medicine

## 2015-04-25 ENCOUNTER — Ambulatory Visit (INDEPENDENT_AMBULATORY_CARE_PROVIDER_SITE_OTHER): Payer: Medicare Other | Admitting: Family Medicine

## 2015-04-25 VITALS — BP 118/64 | HR 84 | Temp 98.1°F | Resp 16 | Ht 64.0 in | Wt 133.0 lb

## 2015-04-25 DIAGNOSIS — R5383 Other fatigue: Secondary | ICD-10-CM | POA: Diagnosis not present

## 2015-04-25 DIAGNOSIS — E871 Hypo-osmolality and hyponatremia: Secondary | ICD-10-CM

## 2015-04-25 MED ORDER — CLOBETASOL PROPIONATE 0.05 % EX CREA: 1.0000 "application " | TOPICAL_CREAM | Freq: Two times a day (BID) | CUTANEOUS | Status: DC

## 2015-04-25 NOTE — Progress Notes (Signed)
Subjective:    Patient ID: Gina [name], female    DOB: Nov 05, 1945, 69 y.o.   MRN: [id number]  HPI  patient presents today to discuss her hyponatremia. She recently underwent cardiac ablation and was found to be hyponatremic with a nadir a sodium of 125. Since that time, her sodium has risen to 134. I reviewed all of her serum sodium level since 2015. She typically averages 130 to 135. The only to time she has had anything less than 132 in May and in June when she was 125 and 127 respectively.   However at that time, the patient admits that she was drinking almost a gallon of water per day. Past Medical History  Diagnosis Date  . Ovarian cyst   . Hypertension   . Insulin pump in place   . 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 Anxiety     new dx  . Hyperlipidemia   . S/P radiation therapy 04/10/12- 05/26/2012    left Breast and Axilla / 50 gy / 25 Fractions with Left Breast Boost / 10 Gy / 5 Fractions  . Use of letrozole (Femara) 06/13/12  . Diabetes mellitus type 1     Insulin pump  . Hypothyroidism   . Diastolic CHF     a. 02/1760 - due to AF RVR;  b. 03/2015 Echo: EF 60-65%, no rwma.  . Atrial fibrillation     a. 03/2015 s/p RFCA;  b. CHA2DS2VASc = 5-->Eliquis.   Past Surgical History  Procedure Laterality Date  . Tonsillectomy    . Ovarian cyst surgery    . Colonoscopy      1 polyp   . 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 Appendectomy    . Tee without cardioversion N/A 04/07/2015    Procedure: TRANSESOPHAGEAL ECHOCARDIOGRAM (TEE);  Surgeon: Thayer Headings, MD;  Location: [place];  Service: Cardiovascular;  Laterality: N/A;  . Electrophysiologic study N/A 04/08/2015    Procedure: Atrial Fibrillation  Ablation;  Surgeon: Thompson Grayer, MD;  Location: [place] [facility];  Service: Cardiovascular;  Laterality: N/A;   Current Outpatient Prescriptions on File Prior to Visit  Medication Sig Dispense Refill  . apixaban (ELIQUIS) 5 MG TABS tablet Take 1 tablet (5 mg total) by mouth 2 (two) times daily. 60 tablet 11  . Ascorbic Acid (VITAMIN C) 1000 MG tablet Take 2,000 mg by mouth 2 (two) times daily.     . Cholecalciferol (VITAMIN D-3) 5000 UNITS TABS Take 5,000 Units by mouth daily.     Marland Kitchen Cod Liver Oil 1000 MG CAPS Take 2,000 mg by mouth 2 (two) times daily.     . Coenzyme Q10 (COQ10) 200 MG CAPS Take 200 mg by mouth daily.    . Cyanocobalamin (VITAMIN B-12) 5000 MCG SUBL Place 5,000 mcg under the tongue daily.    . Digestive Enzymes (BETAINE HCL PO) Take 1 tablet by mouth every 3 (three) days.    Marland Kitchen diltiazem (CARDIZEM CD) 240 MG 24 hr capsule Take 1 capsule (240 mg total) by mouth daily. 30 capsule 11  . ferrous sulfate 325 (65 FE) MG tablet Take 325 mg by mouth daily.    . flecainide (TAMBOCOR) 50 MG tablet Take 50 mg by mouth 2 (two)  times daily.     . insulin lispro (HUMALOG) 100 UNIT/ML injection Basal rate 0.9 7am-12, 0.8 Noon-7am. Max of 60 units SQ every day with pump mer MD DX CODE: E10.65 (Patient taking differently: Insulin pump - Basal rate from 12 am-4:59 am at 0.45 units/hr, 5 am-6:59 am at 0.9 units/hr, 7 am-9:59 am at 1 unit/hr, 10 am-11:59 am at 1.1 units/hr, 12 pm-3:59 pm at 0.9 units/hr, 4 pm-8:59 at 0.8 units/hr, 9 pm-11:59 pm at 0.75 units/hr. Max of 100 units SQ every day; max of 50 units SQ in a 2 hr time frame with pump mer MD DX CODE: E10.65) 20 mL 6  . Magnesium 400 MG CAPS Take 400 mg by mouth daily.    . Misc Natural Products (GLUCOSAMINE CHONDROITIN MSM PO) Take 1 tablet by mouth daily.    . Misc Natural Products (PROGESTERONE EX) Apply 1 ml to chest daily    . NALTREXONE HCL PO Take 4.5 mg by mouth daily.    Marland Kitchen OVER THE COUNTER MEDICATION Take 1 tablet by mouth daily. DIM  Supplement    . pantoprazole (PROTONIX) 40 MG tablet Take 1 tablet (40 mg total) by mouth daily. 45 tablet 0  . RESVERATROL PO Take 1 tablet by mouth daily.    Marland Kitchen thyroid (ARMOUR) 30 MG tablet Take 30 mg by mouth every other day.     . thyroid (ARMOUR) 60 MG tablet Take 60 mg by mouth every other day.     Current Facility-Administered Medications on File Prior to Visit  Medication Dose Route Frequency Provider Last Rate Last Dose  . topical emolient (BIAFINE) emulsion   Topical Daily Eppie Gibson, MD       Allergies  Allergen Reactions  . Statins Other (See Comments)    Leg pain   History   Social History  . Marital Status: Married    Spouse Name: N/A  . Number of Children: N/A  . Years of Education: N/A   Occupational History  . Not on file.   Social History Main Topics  . Smoking status: Never Smoker   . Smokeless tobacco: Not on file  . Alcohol Use: No  . Drug Use: No  . Sexual Activity: Yes   Other Topics Concern  . Not on file   Social History Narrative      Review of Systems  All other systems reviewed and are negative.      Objective:   Physical Exam  Constitutional: She appears well-developed and well-nourished. No distress.  Neck: Neck supple.  Cardiovascular: Normal rate, regular rhythm and normal heart sounds.   Pulmonary/Chest: Effort normal and breath sounds normal.  Abdominal: Soft. Bowel sounds are normal. She exhibits no distension. There is no tenderness. There is no rebound and no guarding.  Musculoskeletal: She exhibits no edema.  Skin: She is not diaphoretic.  Vitals reviewed.         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 I will check a serum sodium level, a serum osmolality , a urine osmolality, and a urine sodium. Given her fatigue I will also check a TSH. If the patient does  have SIADH, her treatment would be fluid restriction to less than 1200 mL per day. Patient has done this own her own and her sodium has already risen to 134 since June.   Patient is not  taking any medication that can cause hyponatremia..   I will await the results of her lab work

## 2015-04-26 ENCOUNTER — Encounter: Payer: Self-pay | Admitting: Family Medicine

## 2015-04-26 LAB — TSH: TSH: 0.507 u[IU]/mL (ref 0.350–4.500)

## 2015-04-26 LAB — COMPLETE METABOLIC PANEL WITH GFR
ALBUMIN: 3.5 g/dL (ref 3.5–5.2)
ALK PHOS: 102 U/L (ref 39–117)
ALT: 18 U/L (ref 0–35)
AST: 20 U/L (ref 0–37)
BUN: 16 mg/dL (ref 6–23)
CALCIUM: 9 mg/dL (ref 8.4–10.5)
CO2: 30 mEq/L (ref 19–32)
Chloride: 90 mEq/L — ABNORMAL LOW (ref 96–112)
Creat: 0.64 mg/dL (ref 0.50–1.10)
GFR, Est African American: >89 mL/min
Glucose, Bld: 272 mg/dL — ABNORMAL HIGH (ref 70–99)
Potassium: 4.5 mEq/L (ref 3.5–5.3)
Sodium: 130 mEq/L — ABNORMAL LOW (ref 135–145)
Total Bilirubin: 0.6 mg/dL (ref 0.2–1.2)
Total Protein: 6.4 g/dL (ref 6.0–8.3)

## 2015-04-26 LAB — SODIUM, URINE, RANDOM: Sodium, Ur: 81 mmol/L (ref 28–272)

## 2015-04-26 LAB — OSMOLALITY, URINE: Osmolality, Ur: 618 mOsm/kg (ref 390–1090)

## 2015-04-26 LAB — OSMOLALITY: Osmolality: 288 mOsm/kg (ref 275–300)

## 2015-04-27 ENCOUNTER — Ambulatory Visit: Payer: Medicare Other | Admitting: Internal Medicine

## 2015-04-27 ENCOUNTER — Encounter: Payer: Self-pay | Admitting: Family Medicine

## 2015-05-03 ENCOUNTER — Other Ambulatory Visit: Payer: Self-pay | Admitting: Endocrinology

## 2015-05-03 NOTE — Telephone Encounter (Signed)
Caroline More, This is your pt's prescription. Thanks!

## 2015-05-04 ENCOUNTER — Other Ambulatory Visit: Payer: Medicare Other

## 2015-05-04 DIAGNOSIS — R5382 Chronic fatigue, unspecified: Secondary | ICD-10-CM

## 2015-05-04 DIAGNOSIS — E871 Hypo-osmolality and hyponatremia: Secondary | ICD-10-CM

## 2015-05-05 LAB — CORTISOL-AM, BLOOD: Cortisol - AM: 19.5 ug/dL (ref 4.3–22.4)

## 2015-05-06 ENCOUNTER — Encounter (HOSPITAL_COMMUNITY): Payer: Self-pay | Admitting: Nurse Practitioner

## 2015-05-06 ENCOUNTER — Ambulatory Visit (HOSPITAL_COMMUNITY)
Admission: RE | Admit: 2015-05-06 | Discharge: 2015-05-06 | Disposition: A | Discharge status: Home / Self Care | Payer: Medicare Other | Source: Ambulatory Visit | Attending: Nurse Practitioner | Admitting: Nurse Practitioner

## 2015-05-06 VITALS — BP 122/66 | HR 73 | Ht 64.0 in | Wt 132.4 lb

## 2015-05-06 DIAGNOSIS — E222 Syndrome of inappropriate secretion of antidiuretic hormone: Secondary | ICD-10-CM | POA: Insufficient documentation

## 2015-05-06 DIAGNOSIS — I4819 Other persistent atrial fibrillation: Secondary | ICD-10-CM

## 2015-05-06 DIAGNOSIS — I481 Persistent atrial fibrillation: Secondary | ICD-10-CM

## 2015-05-06 DIAGNOSIS — I10 Essential (primary) hypertension: Secondary | ICD-10-CM | POA: Diagnosis not present

## 2015-05-06 DIAGNOSIS — I4891 Unspecified atrial fibrillation: Secondary | ICD-10-CM

## 2015-05-06 LAB — ACTH: C206 ACTH: 9 pg/mL (ref 6–50)

## 2015-05-06 NOTE — Progress Notes (Signed)
Patient ID: Gina [name], female   DOB: 15-Jun-1946, 69 y.o.   MRN: [id number]     Primary Care Physician: Odette Fraction, MD Referring Physician: Dr. Leonides Grills Hershkowitz is a 69 y.o. female with a h/o persistent afib that had afib ablation 6/24. She called the office early after ablation and c/o being weak. She wanted to come off some meds. and she was given permission to  wean  off metoprolol and lower flecainide to 50 mg bid. Continues on daily diltiazem.She has not noted any afib. Today her energy is the best it has been since the procedure. No leg pain or swallowing difficulties. She has been worked up recently by her PCP for hyponatremia. 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 She continues on Eliquis with chadsvasc score of 5, no bleeding issues.  Today, she denies symptoms of palpitations, chest pain, shortness of breath, orthopnea, PND, lower extremity edema, dizziness, presyncope, syncope, or neurologic sequela. The patient is tolerating medications without difficulties and is otherwise without complaint today.   Past Medical History  Diagnosis Date  . Ovarian cyst   . Hypertension   . Insulin pump in place   . 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 Anxiety     new dx  . Hyperlipidemia   . S/P radiation therapy 04/10/12- 05/26/2012    left Breast and Axilla / 50 gy / 25 Fractions with Left Breast Boost / 10 Gy / 5 Fractions  . Use of letrozole (Femara) 06/13/12  . Diabetes mellitus type 1     Insulin pump  . Hypothyroidism   . Diastolic CHF     a. 05/8501 - due to AF RVR;  b. 03/2015 Echo: EF 60-65%, no rwma.  . Atrial fibrillation     a. 03/2015 s/p RFCA;  b. CHA2DS2VASc = 5-->Eliquis.  Marland Kitchen SIADH (syndrome of inappropriate ADH production)    Past Surgical History  Procedure Laterality Date  . Tonsillectomy    . Ovarian cyst surgery    .  Colonoscopy      1 polyp   . 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 Appendectomy    . Tee without cardioversion N/A 04/07/2015    Procedure: TRANSESOPHAGEAL ECHOCARDIOGRAM (TEE);  Surgeon: Thayer Headings, MD;  Location: [facility];  Service: Cardiovascular;  Laterality: N/A;  . Electrophysiologic study N/A 04/08/2015    Procedure: Atrial Fibrillation Ablation;  Surgeon: Thompson Grayer, MD;  Location: [place] [facility];  Service: Cardiovascular;  Laterality: N/A;    Current Outpatient Prescriptions  Medication Sig Dispense Refill  . apixaban (ELIQUIS) 5 MG TABS tablet Take 1 tablet (5 mg total) by mouth 2 (two) times daily. 60 tablet 11  . Ascorbic Acid (VITAMIN C) 1000 MG tablet Take 2,000 mg by mouth 2 (two) times daily.     . Cholecalciferol (VITAMIN D-3) 5000 UNITS TABS Take 5,000 Units by mouth daily.     Marland Kitchen Cod Liver Oil 1000 MG CAPS Take 2,000 mg by mouth 2 (two) times daily.     . Coenzyme Q10 (COQ10) 200 MG CAPS Take 200 mg by mouth daily.    . Cyanocobalamin (VITAMIN B-12) 5000 MCG SUBL Place 5,000 mcg under the tongue daily.    Marland Kitchen  diltiazem (CARDIZEM CD) 240 MG 24 hr capsule Take 1 capsule (240 mg total) by mouth daily. 30 capsule 11  . ferrous sulfate 325 (65 FE) MG tablet Take 325 mg by mouth daily.    . flecainide (TAMBOCOR) 50 MG tablet Take 50 mg by mouth 2 (two) times daily.     Marland Kitchen HUMALOG 100 UNIT/ML injection BASAL RATE 0.9 7AM-12, 0.8 NOON-7AM. MAX OF 60 UNITS DAILY WITH PUMP PER MD 20 mL 5  . insulin lispro (HUMALOG) 100 UNIT/ML injection Basal rate 0.9 7am-12, 0.8 Noon-7am. Max of 60 units SQ every day with pump mer MD DX CODE: E10.65 (Patient taking differently: Insulin pump - Basal rate from 12 am-4:59 am at 0.45 units/hr, 5 am-6:59 am at 0.9 units/hr, 7 am-9:59 am at 1 unit/hr, 10 am-11:59 am at 1.1 units/hr, 12  pm-3:59 pm at 0.9 units/hr, 4 pm-8:59 at 0.8 units/hr, 9 pm-11:59 pm at 0.75 units/hr. Max of 100 units SQ every day; max of 50 units SQ in a 2 hr time frame with pump mer MD DX CODE: E10.65) 20 mL 6  . Magnesium 400 MG CAPS Take 400 mg by mouth daily.    . Misc Natural Products (GLUCOSAMINE CHONDROITIN MSM PO) Take 1 tablet by mouth daily.    . Misc Natural Products (PROGESTERONE EX) Apply 1 ml to chest daily    . NALTREXONE HCL PO Take 4.5 mg by mouth daily.    Marland Kitchen OVER THE COUNTER MEDICATION Take 1 tablet by mouth daily. DIM Supplement    . RESVERATROL PO Take 1 tablet by mouth daily.    Marland Kitchen thyroid (ARMOUR) 30 MG tablet Take 30 mg by mouth every other day.     . thyroid (ARMOUR) 60 MG tablet Take 60 mg by mouth every other day.    . clobetasol cream (TEMOVATE) 1.61 % Apply 1 application topically 2 (two) times daily. 30 g 0  . Digestive Enzymes (BETAINE HCL PO) Take 1 tablet by mouth every 3 (three) days.    . pantoprazole (PROTONIX) 40 MG tablet Take 1 tablet (40 mg total) by mouth daily. (Patient not taking: Reported on 05/06/2015) 45 tablet 0   No current facility-administered medications for this encounter.   Facility-Administered Medications Ordered in Other Encounters  Medication Dose Route Frequency Provider Last Rate Last Dose  . topical emolient (BIAFINE) emulsion   Topical Daily Eppie Gibson, MD        Allergies  Allergen Reactions  . Statins Other (See Comments)    Leg pain    History   Social History  . Marital Status: Married    Spouse Name: N/A  . Number of Children: N/A  . Years of Education: N/A   Occupational History  . Not on file.   Social History Main Topics  . Smoking status: Never Smoker   . Smokeless tobacco: Not on file  . Alcohol Use: No  . Drug Use: No  . Sexual Activity: Yes   Other Topics Concern  . Not on file   Social History Narrative    Family History  Problem Relation Age of Onset  . Breast cancer Mother 71  . Prostate cancer  Maternal Uncle     died in his 69s  . Heart failure Mother   . Heart attack Father     ROS- All systems are reviewed and negative except as per the HPI above Epic records reviewed  Physical Exam: Filed Vitals:   05/06/15 1005  BP: 122/66  Pulse: 73  Height: _0  (1.626 m)  Weight: 132 lb 6.4 oz (60.056 kg)    GEN- The patient is well appearing, alert and oriented x 3 today.   Head- normocephalic, atraumatic Eyes-  Sclera clear, conjunctiva pink Ears- hearing intact Oropharynx- clear Neck- supple, no JVP Lymph- no cervical lymphadenopathy Lungs- Clear to ausculation bilaterally, normal work of breathing Heart- Regular rate and rhythm, no murmurs, rubs or gallops, PMI not laterally displaced GI- soft, NT, ND, + BS Extremities- no clubbing, cyanosis, or edema MS- no significant deformity or atrophy Skin- no rash or lesion Psych- euthymic mood, full affect Neuro- strength and sensation are intact  EKG- NSR, normal EKG at 73 bpm Epic records reviewed  Assessment and Plan: 1. Persistent afib s/p afib ablation 04/08/15 Maintaining SR  Feeling stronger Continue current meds for rate/rhythm control until seen by Dr. Rayann Heman at 3 month post procedure She knows she will have to stay on eliquis  2. SIADH Limit fluids as instructed by PCP  3. HTN Well managed  F/u with Dr. Rayann Heman as scheduled in September, [place] clinic as needed.

## 2015-05-22 ENCOUNTER — Encounter (HOSPITAL_BASED_OUTPATIENT_CLINIC_OR_DEPARTMENT_OTHER): Payer: Medicare Other

## 2015-05-23 ENCOUNTER — Telehealth: Payer: Self-pay | Admitting: Internal Medicine

## 2015-05-23 ENCOUNTER — Encounter: Payer: Self-pay | Admitting: Internal Medicine

## 2015-05-23 ENCOUNTER — Encounter (HOSPITAL_BASED_OUTPATIENT_CLINIC_OR_DEPARTMENT_OTHER): Payer: Medicare Other

## 2015-05-26 ENCOUNTER — Encounter (HOSPITAL_COMMUNITY): Payer: Self-pay | Admitting: Nurse Practitioner

## 2015-05-26 ENCOUNTER — Telehealth (HOSPITAL_COMMUNITY): Payer: Self-pay | Admitting: *Deleted

## 2015-05-26 ENCOUNTER — Other Ambulatory Visit: Payer: Medicare Other

## 2015-05-26 ENCOUNTER — Ambulatory Visit (HOSPITAL_COMMUNITY)
Admission: RE | Admit: 2015-05-26 | Discharge: 2015-05-26 | Disposition: A | Discharge status: Home / Self Care | Payer: Medicare Other | Source: Ambulatory Visit | Attending: Nurse Practitioner | Admitting: Nurse Practitioner

## 2015-05-26 ENCOUNTER — Ambulatory Visit (HOSPITAL_COMMUNITY): Payer: Medicare Other | Admitting: Nurse Practitioner

## 2015-05-26 VITALS — BP 118/74 | Ht 64.0 in | Wt 132.0 lb

## 2015-05-26 DIAGNOSIS — E109 Type 1 diabetes mellitus without complications: Secondary | ICD-10-CM | POA: Diagnosis not present

## 2015-05-26 DIAGNOSIS — Z7902 Long term (current) use of antithrombotics/antiplatelets: Secondary | ICD-10-CM | POA: Insufficient documentation

## 2015-05-26 DIAGNOSIS — I4891 Unspecified atrial fibrillation: Secondary | ICD-10-CM | POA: Diagnosis present

## 2015-05-26 DIAGNOSIS — Z803 Family history of malignant neoplasm of breast: Secondary | ICD-10-CM | POA: Diagnosis not present

## 2015-05-26 DIAGNOSIS — Z9641 Presence of insulin pump (external) (internal): Secondary | ICD-10-CM | POA: Diagnosis not present

## 2015-05-26 DIAGNOSIS — E785 Hyperlipidemia, unspecified: Secondary | ICD-10-CM | POA: Diagnosis not present

## 2015-05-26 DIAGNOSIS — Z923 Personal history of irradiation: Secondary | ICD-10-CM | POA: Insufficient documentation

## 2015-05-26 DIAGNOSIS — I10 Essential (primary) hypertension: Secondary | ICD-10-CM | POA: Insufficient documentation

## 2015-05-26 DIAGNOSIS — E039 Hypothyroidism, unspecified: Secondary | ICD-10-CM | POA: Diagnosis not present

## 2015-05-26 DIAGNOSIS — Z853 Personal history of malignant neoplasm of breast: Secondary | ICD-10-CM | POA: Insufficient documentation

## 2015-05-26 DIAGNOSIS — Z8042 Family history of malignant neoplasm of prostate: Secondary | ICD-10-CM | POA: Diagnosis not present

## 2015-05-26 DIAGNOSIS — I481 Persistent atrial fibrillation: Secondary | ICD-10-CM | POA: Diagnosis not present

## 2015-05-26 DIAGNOSIS — I4819 Other persistent atrial fibrillation: Secondary | ICD-10-CM

## 2015-05-26 DIAGNOSIS — Z79899 Other long term (current) drug therapy: Secondary | ICD-10-CM | POA: Insufficient documentation

## 2015-05-26 LAB — COMPREHENSIVE METABOLIC PANEL
ALT: 22 U/L (ref 14–54)
ANION GAP: 9 (ref 5–15)
AST: 25 U/L (ref 15–41)
Albumin: 3.4 g/dL — ABNORMAL LOW (ref 3.5–5.0)
Alkaline Phosphatase: 79 U/L (ref 38–126)
BUN: 9 mg/dL (ref 6–20)
CO2: 29 mmol/L (ref 22–32)
CREATININE: 0.62 mg/dL (ref 0.44–1.00)
Calcium: 9.1 mg/dL (ref 8.9–10.3)
Chloride: 97 mmol/L — ABNORMAL LOW (ref 101–111)
GFR calc Af Amer: >60 mL/min (ref >60)
Glucose, Bld: 135 mg/dL — ABNORMAL HIGH (ref 65–99)
Potassium: 4.1 mmol/L (ref 3.5–5.1)
Sodium: 135 mmol/L (ref 135–145)
Total Bilirubin: 1.1 mg/dL (ref 0.3–1.2)
Total Protein: 6.9 g/dL (ref 6.5–8.1)

## 2015-05-26 LAB — CBC
HCT: 38.6 % (ref 36.0–46.0)
HEMOGLOBIN: 12.6 g/dL (ref 12.0–15.0)
MCH: 28.6 pg (ref 26.0–34.0)
MCHC: 32.6 g/dL (ref 30.0–36.0)
MCV: 87.7 fL (ref 78.0–100.0)
Platelets: 187 10*3/uL (ref 150–400)
RBC: 4.4 MIL/uL (ref 3.87–5.11)
RDW: 15.7 % — AB (ref 11.5–15.5)
WBC: 6 10*3/uL (ref 4.0–10.5)

## 2015-05-26 LAB — TSH: TSH: 0.624 u[IU]/mL (ref 0.350–4.500)

## 2015-05-26 NOTE — Progress Notes (Signed)
Patient ID: Gina [name], female   DOB: February 03, 1946, 69 y.o.   MRN: [id number]     Primary Care Physician: Odette Fraction, MD Referring Physician: Dr. Leonides Grills Mosquera is a 69 y.o. female with a h/o afib ablation 6/24 that I saw 7/22 and was dong well one month ablation. She was suppose to f/u next with Dr. Rayann Heman 9/26. She however came today for an appointment that was an old appointment prior to ablation and new appointemtns being scheduled. She has a few concersn so was seen She stes that at times he has some weakness, dizziness at times, slight chest discomfort at imes and the other day had a very bad headache which is not usual for her at all. She however does not think she has had any  significant afib since procedure. She has also had some issues with hyponatremia which has been been evaluated. Her dose of flecainide has been reduced from 100 mg a day to 50 mg bid since ablation.  Today, she denies symptoms of palpitations, chest pain, shortness of breath, orthopnea, PND, lower extremity edema, dizziness, presyncope, syncope, or neurologic sequela. The patient is tolerating medications without difficulties and is otherwise without complaint today.   Past Medical History  Diagnosis Date  . Ovarian cyst   . Hypertension   . Insulin pump in place   . 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 Anxiety     new dx  . Hyperlipidemia   . S/P radiation therapy 04/10/12- 05/26/2012    left Breast and Axilla / 50 gy / 25 Fractions with Left Breast Boost / 10 Gy / 5 Fractions  . Use of letrozole (Femara) 06/13/12  . Diabetes mellitus type 1     Insulin pump  . Hypothyroidism   . Diastolic CHF     a. 06/7025 - due to AF RVR;  b. 03/2015 Echo: EF 60-65%, no rwma.  . Atrial fibrillation     a. 03/2015 s/p RFCA;  b. CHA2DS2VASc = 5-->Eliquis.  Marland Kitchen SIADH (syndrome of inappropriate  ADH production)    Past Surgical History  Procedure Laterality Date  . Tonsillectomy    . Ovarian cyst surgery    . Colonoscopy      1 polyp   . 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 Appendectomy    . Tee without cardioversion N/A 04/07/2015    Procedure: TRANSESOPHAGEAL ECHOCARDIOGRAM (TEE);  Surgeon: Thayer Headings, MD;  Location: [place];  Service: Cardiovascular;  Laterality: N/A;  . Electrophysiologic study N/A 04/08/2015    Procedure: Atrial Fibrillation Ablation;  Surgeon: Thompson Grayer, MD;  Location: [place] [facility];  Service: Cardiovascular;  Laterality: N/A;    Current Outpatient Prescriptions  Medication Sig Dispense Refill  . apixaban (ELIQUIS) 5 MG TABS tablet Take 1 tablet (5 mg total) by mouth 2 (two) times daily. 60 tablet 11  . Ascorbic Acid (VITAMIN C) 1000 MG tablet Take 2,000 mg by mouth 2 (two) times daily.     . Cholecalciferol (VITAMIN D-3) 5000 UNITS TABS Take 5,000 Units by mouth daily.     . clobetasol cream (TEMOVATE) 3.78 % Apply 1 application topically 2 (two) times daily. 30 g 0  . Cod Liver Oil 1000 MG CAPS  Take 2,000 mg by mouth 2 (two) times daily.     . Coenzyme Q10 (COQ10) 200 MG CAPS Take 200 mg by mouth daily.    . Cyanocobalamin (VITAMIN B-12) 5000 MCG SUBL Place 5,000 mcg under the tongue daily.    . Digestive Enzymes (BETAINE HCL PO) Take 1 tablet by mouth every 3 (three) days.    Marland Kitchen diltiazem (CARDIZEM CD) 240 MG 24 hr capsule Take 1 capsule (240 mg total) by mouth daily. 30 capsule 11  . ferrous sulfate 325 (65 FE) MG tablet Take 325 mg by mouth daily.    . flecainide (TAMBOCOR) 50 MG tablet Take 50 mg by mouth 2 (two) times daily.     Marland Kitchen HUMALOG 100 UNIT/ML injection BASAL RATE 0.9 7AM-12, 0.8 NOON-7AM. MAX OF 60 UNITS DAILY WITH PUMP PER MD 20 mL 5  . insulin lispro (HUMALOG)  100 UNIT/ML injection Basal rate 0.9 7am-12, 0.8 Noon-7am. Max of 60 units SQ every day with pump mer MD DX CODE: E10.65 (Patient taking differently: Insulin pump - Basal rate from 12 am-4:59 am at 0.45 units/hr, 5 am-6:59 am at 0.9 units/hr, 7 am-9:59 am at 1 unit/hr, 10 am-11:59 am at 1.1 units/hr, 12 pm-3:59 pm at 0.9 units/hr, 4 pm-8:59 at 0.8 units/hr, 9 pm-11:59 pm at 0.75 units/hr. Max of 100 units SQ every day; max of 50 units SQ in a 2 hr time frame with pump mer MD DX CODE: E10.65) 20 mL 6  . Magnesium 400 MG CAPS Take 400 mg by mouth daily.    . Misc Natural Products (GLUCOSAMINE CHONDROITIN MSM PO) Take 1 tablet by mouth daily.    . Misc Natural Products (PROGESTERONE EX) Apply 1 ml to chest daily    . NALTREXONE HCL PO Take 4.5 mg by mouth daily.    Marland Kitchen OVER THE COUNTER MEDICATION Take 1 tablet by mouth daily. DIM Supplement    . RESVERATROL PO Take 1 tablet by mouth daily.    Marland Kitchen thyroid (ARMOUR) 30 MG tablet Take 30 mg by mouth every other day.     . thyroid (ARMOUR) 60 MG tablet Take 60 mg by mouth every other day.     No current facility-administered medications for this encounter.   Facility-Administered Medications Ordered in Other Encounters  Medication Dose Route Frequency Provider Last Rate Last Dose  . topical emolient (BIAFINE) emulsion   Topical Daily Eppie Gibson, MD        Allergies  Allergen Reactions  . Statins Other (See Comments)    Leg pain    Social History   Social History  . Marital Status: Married    Spouse Name: N/A  . Number of Children: N/A  . Years of Education: N/A   Occupational History  . Not on file.   Social History Main Topics  . Smoking status: Never Smoker   . Smokeless tobacco: Not on file  . Alcohol Use: No  . Drug Use: No  . Sexual Activity: Yes   Other Topics Concern  . Not on file   Social History Narrative    Family History  Problem Relation Age of Onset  . Breast cancer Mother 33  . Prostate cancer Maternal Uncle      died in his 30s  . Heart failure Mother   . Heart attack Father     ROS- All systems are reviewed and negative except as per the HPI above  Physical Exam: Filed Vitals:   05/26/15 0852  BP: 118/74  Height: '5\' 4"'$  (1.626 m)  Weight: 132 lb (59.875 kg)    GEN- The patient is well appearing, alert and oriented x 3 today.   Head- normocephalic, atraumatic Eyes-  Sclera clear, conjunctiva pink Ears- hearing intact Oropharynx- clear Neck- supple, no JVP Lymph- no cervical lymphadenopathy Lungs- Clear to ausculation bilaterally, normal work of breathing Heart- Regular rate and rhythm, no murmurs, rubs or gallops, PMI not laterally displaced GI- soft, NT, ND, + BS Extremities- no clubbing, cyanosis, or edema MS- no significant deformity or atrophy Skin- no rash or lesion Psych- euthymic mood, full affect Neuro- strength and sensation are intact  EKG- NSR at 74 bpm. PR int 134 ms, QRs int 76 ms, QTc, 406 ms  Assessment and Plan:  1. Afib  Maintaining SR Improved from a rhythm standpoint, appears to be maintaining SR, but still has issues with occasional lightheadedness, weakness, h/a. Unsure if symptoms are related to ablation or other health issues. Will draw a cmet, tsh, cbc and flecainide level.  F/u with Dr. Rayann Heman as scheduled in September.  Geroge Baseman Anavictoria Wilk, [facility] [address] [place], [place] [address] [phone]

## 2015-05-26 NOTE — Telephone Encounter (Signed)
Pt was to have lab work at Dr. Dwyane Dee office after appointment today -- called and confirmed that TSH and CMP was to be drawn there; will draw here to avoid 2 sticks. Will route results to Dr. Dwyane Dee so he is aware.

## 2015-05-27 ENCOUNTER — Telehealth (HOSPITAL_COMMUNITY): Payer: Self-pay | Admitting: *Deleted

## 2015-05-27 NOTE — Telephone Encounter (Signed)
Patient called back to report HR was now 68 and she was feeling much better. States still feels irregular but the rate is staying in her normal range now.  Reminded patient to take normal dose of flecainide this evening at her normal time and if any issues over weekend she can call the on call physician for [facility].

## 2015-05-27 NOTE — Telephone Encounter (Signed)
Close encounter 

## 2015-05-27 NOTE — Telephone Encounter (Signed)
Patient called in stating around 3am she woke up in Afib HR 140-150s.  BP machine would not read blood pressure only error message and patient felt extremely lightheaded and short of breath.  Went back to bed and got up her normal time and took medications as normally prescribed. (pt recently cancelled sleep study and would not reschedule since sleep center did not send her a "packet" with the information related to sleep study instructions).  Patient did not call clinic until 1230pm and said HR still in 140-150s BP 100/60s. Was instructed by [name], NP to take extra 12.5mg  of metoprolol and call back in hour to report how heart rate was.  Patient called back at 1:30pm stating HR was still around 140 but BP was 95/73. Not feeling as lightheaded and not feeling short of breath at present time.  Instructed to take extra 50mg  of flecainide now -- instructed to take her normal dose of flecainide this evening as she usually does.  Told patient to go to emergency room if this evening her HR was still elevated and BP still low and if she is not feeling well to be further evaluated. Patient verbalized understanding of instructions and will take extra 50mg  of flecainide now.

## 2015-05-31 ENCOUNTER — Encounter: Payer: Self-pay | Admitting: Endocrinology

## 2015-05-31 ENCOUNTER — Ambulatory Visit (INDEPENDENT_AMBULATORY_CARE_PROVIDER_SITE_OTHER): Payer: Medicare Other | Admitting: Endocrinology

## 2015-05-31 VITALS — BP 118/68 | HR 79 | Temp 97.6°F | Resp 14 | Ht 64.0 in | Wt 135.8 lb

## 2015-05-31 DIAGNOSIS — E1065 Type 1 diabetes mellitus with hyperglycemia: Secondary | ICD-10-CM | POA: Diagnosis not present

## 2015-05-31 DIAGNOSIS — E063 Autoimmune thyroiditis: Secondary | ICD-10-CM

## 2015-05-31 DIAGNOSIS — E7800 Pure hypercholesterolemia, unspecified: Secondary | ICD-10-CM

## 2015-05-31 DIAGNOSIS — IMO0002 Reserved for concepts with insufficient information to code with codable children: Secondary | ICD-10-CM

## 2015-05-31 DIAGNOSIS — E038 Other specified hypothyroidism: Secondary | ICD-10-CM | POA: Diagnosis not present

## 2015-05-31 NOTE — Progress Notes (Signed)
Patient ID: Gina [name], female   DOB: Sep 03, 1946, 69 y.o.   MRN: [id number]   Reason for Appointment: Insulin Pump followup:   History of Present Illness   Diagnosis: Type 1 DIABETES MELITUS, date of diagnosis:  1984     CURRENT insulin pump:  One Touch Ping, has been on a pump since 1995  HISTORY:   The pump SETTINGS are: Midnight = 0.45, 5 AM = 0.9, 7 AM = 1.0; 10 AM = 1.1, 12 noon = 0.9, 4 PM = 0.8 and 9 PM = 0.75.  Total insulin dose about 30 units per day Carbohydrate ratio 1:8.  Blood sugar target 110 .  Correction factor 1: 30  Previous A1c had been significantly higher at 8% and this was subsequent to her ablation procedure for atrial fibrillation and decreased activity level Basal rates were not changed on her last visit even though she was given instructions on increasing the However blood sugars have been relatively better more recently  Current blood sugar patterns and problems identified:   Fasting blood sugars are slightly high overall including a reading of 200 this morning  Highest blood sugars are in the mornings sometimes after meals and sporadically at all different times has readings over 200  She is however checking her blood sugars 8 times a day as indicated on her pump download and frequently in between meals and sometimes early morning  Has not had any hypoglycemia with only one reading of 64 following a correction for a reading of 330  Recently her blood sugars have not been low normal as they were the first week of the month before lunch and supper  Apart from fasting high blood sugars more recently overall blood sugars are excellent and fairly stable compared to a previous readings  Her A1c is not due as yet  Although she thinks she has had better appetite she is eating very little carbohydrate as judged by her insulin pump entries   GLUCOSE CONTROL with the pump is assessed today by pump download.    Mean values apply above for all  meters except median for One Touch  PRE-MEAL Fasting Lunch Dinner Bedtime Overall  Glucose range:  98-200   73-200 27 77-205   97-232    Mean/median:  151  119   144   154   137    Pump management: She is [place] 4.5imes a day, average carbohydrate intake 32 daily  EXERCISE:  None recently  Wt Readings from Last 3 Encounters:  05/31/15 135 lb 12.8 oz (61.598 kg)  05/26/15 132 lb (59.875 kg)  05/06/15 132 lb 6.4 oz (60.056 kg)    LABS:   A1c 6.8 from outside lab on 09/13/14  Lab Results  Component Value Date   HGBA1C 8.0* 04/14/2015   HGBA1C 7.1* 01/11/2015   HGBA1C 7.2* 01/09/2015   Lab Results  Component Value Date   MICROALBUR 0.8 04/14/2015   LDLCALC 114* 04/14/2015   CREATININE 0.62 05/26/2015   Hospital Outpatient Visit on 05/26/2015  Component Date Value Ref Range Status  . TSH 05/26/2015 0.624  0.350 - 4.500 uIU/mL Final  . WBC 05/26/2015 6.0  4.0 - 10.5 K/uL Final  . RBC 05/26/2015 4.40  3.87 - 5.11 MIL/uL Final  . Hemoglobin 05/26/2015 12.6  12.0 - 15.0 g/dL Final  . HCT 79/52/7151 38.6  36.0 - 46.0 % Final  . MCV 05/26/2015 87.7  78.0 - 100.0 fL Final  . MCH 05/26/2015 28.6  26.0 -  34.0 pg Final  . MCHC 05/26/2015 32.6  30.0 - 36.0 g/dL Final  . RDW 05/26/2015 15.7* 11.5 - 15.5 % Final  . Platelets 05/26/2015 187  150 - 400 K/uL Final  . Sodium 05/26/2015 135  135 - 145 mmol/L Final  . Potassium 05/26/2015 4.1  3.5 - 5.1 mmol/L Final  . Chloride 05/26/2015 97* 101 - 111 mmol/L Final  . CO2 05/26/2015 29  22 - 32 mmol/L Final  . Glucose, Bld 05/26/2015 135* 65 - 99 mg/dL Final  . BUN 05/26/2015 9  6 - 20 mg/dL Final  . Creatinine, Ser 05/26/2015 0.62  0.44 - 1.00 mg/dL Final  . Calcium 05/26/2015 9.1  8.9 - 10.3 mg/dL Final  . Total Protein 05/26/2015 6.9  6.5 - 8.1 g/dL Final  . Albumin 05/26/2015 3.4* 3.5 - 5.0 g/dL Final  . AST 05/26/2015 25  15 - 41 U/L Final  . ALT 05/26/2015 22  14 - 54 U/L Final  . Alkaline Phosphatase 05/26/2015 79  38 - 126  U/L Final  . Total Bilirubin 05/26/2015 1.1  0.3 - 1.2 mg/dL Final  . GFR calc non Af Amer 05/26/2015 >60  >60 mL/min Final  . GFR calc Af Amer 05/26/2015 >60  >60 mL/min Final   Comment: (NOTE) The eGFR has been calculated using the CKD EPI equation. This calculation has not been validated in all clinical situations. eGFR's persistently <60 mL/min signify possible Chronic Kidney Disease.   . Anion gap 05/26/2015 9  5 - 15 Final       Medication List       This list is accurate as of: 05/31/15  9:27 PM.  Always use your most recent med list.               apixaban 5 MG Tabs tablet  Commonly known as:  ELIQUIS  Take 1 tablet (5 mg total) by mouth 2 (two) times daily.     BETAINE HCL PO  Take 1 tablet by mouth every 3 (three) days.     clobetasol cream 0.05 %  Commonly known as:  TEMOVATE  Apply 1 application topically 2 (two) times daily.     Cod Liver Oil 1000 MG Caps  Take 2,000 mg by mouth 2 (two) times daily.     CoQ10 200 MG Caps  Take 200 mg by mouth daily.     diltiazem 240 MG 24 hr capsule  Commonly known as:  CARDIZEM CD  Take 1 capsule (240 mg total) by mouth daily.     ferrous sulfate 325 (65 FE) MG tablet  Take 325 mg by mouth daily.     flecainide 50 MG tablet  Commonly known as:  TAMBOCOR  Take 50 mg by mouth 2 (two) times daily.     insulin lispro 100 UNIT/ML injection  Commonly known as:  HUMALOG  Basal rate 0.9 7am-12, 0.8 Noon-7am. Max of 60 units SQ every day with pump mer MD DX CODE: E10.65     HUMALOG 100 UNIT/ML injection  Generic drug:  insulin lispro  BASAL RATE 0.9 7AM-12, 0.8 NOON-7AM. MAX OF 60 UNITS DAILY WITH PUMP PER MD     Magnesium 400 MG Caps  Take 400 mg by mouth daily.     metoprolol tartrate 25 MG tablet  Commonly known as:  LOPRESSOR     NALTREXONE HCL PO  Take 4.5 mg by mouth daily.     OVER THE COUNTER MEDICATION  Take 1 tablet by mouth  daily. DIM Supplement     pantoprazole 40 MG tablet  Commonly known as:   PROTONIX  TAKE 1 TABLET (40 MG TOTAL) BY MOUTH DAILY.     PROGESTERONE EX  Apply 1 ml to chest daily     GLUCOSAMINE CHONDROITIN MSM PO  Take 1 tablet by mouth daily.     RESVERATROL PO  Take 1 tablet by mouth daily.     thyroid 60 MG tablet  Commonly known as:  ARMOUR  Take 60 mg by mouth every other day.     thyroid 30 MG tablet  Commonly known as:  ARMOUR  Take 30 mg by mouth every other day.     Vitamin B-12 5000 MCG Subl  Place 5,000 mcg under the tongue daily.     vitamin C 1000 MG tablet  Take 2,000 mg by mouth 2 (two) times daily.     Vitamin D-3 5000 UNITS Tabs  Take 5,000 Units by mouth daily.        Allergies:  Allergies  Allergen Reactions  . Statins Other (See Comments)    Leg pain    Past Medical History  Diagnosis Date  . Ovarian cyst   . Hypertension   . Insulin pump in place   . 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 Anxiety     new dx  . Hyperlipidemia   . S/P radiation therapy 04/10/12- 05/26/2012    left Breast and Axilla / 50 gy / 25 Fractions with Left Breast Boost / 10 Gy / 5 Fractions  . Use of letrozole (Femara) 06/13/12  . Diabetes mellitus type 1     Insulin pump  . Hypothyroidism   . Diastolic CHF     a. 0/3704 - due to AF RVR;  b. 03/2015 Echo: EF 60-65%, no rwma.  . Atrial fibrillation     a. 03/2015 s/p RFCA;  b. CHA2DS2VASc = 5-->Eliquis.  Marland Kitchen SIADH (syndrome of inappropriate ADH production)     Past Surgical History  Procedure Laterality Date  . Tonsillectomy    . Ovarian cyst surgery    . Colonoscopy      1 polyp   . 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 Appendectomy    . Tee without cardioversion N/A 04/07/2015    Procedure: TRANSESOPHAGEAL  ECHOCARDIOGRAM (TEE);  Surgeon: Thayer Headings, MD;  Location: [place];  Service: Cardiovascular;  Laterality: N/A;  . Electrophysiologic study N/A 04/08/2015    Procedure: Atrial Fibrillation Ablation;  Surgeon: Thompson Grayer, MD;  Location: [facility];  Service: Cardiovascular;  Laterality: N/A;    Family History  Problem Relation Age of Onset  . Breast cancer Mother 64  . Prostate cancer Maternal Uncle     died in his 32s  . Heart failure Mother   . Heart attack Father     Social History:  reports that she has never smoked. She does not have any smokeless tobacco history on file. She reports that she does not drink alcohol or use illicit drugs.  REVIEW of systems:   She has a history of mild hypothyroidismfollowed by a wellness physician Not clear why she is getting a regimen of 60 mg of Armour Thyroid alternating with 30 mg but  her TSH is recently normal   Lab Results  Component Value Date   TSH 0.624 05/26/2015    HYPERCHOLESTEROLEMIA: Her LDL has been persistently high and refuses to take a statin drug including pravastatin even though it has had no side effects previously She was told by her wellness  physician that she does not need any  treatment because her particle size is normal  However her LDL particle number is consistently high at 1621  Previous LDL of 161  from outside lab LDL is better in 6/16 especially with her significant weight loss, HDL is also low She is trying to use red yeast rice   Lab Results  Component Value Date   CHOL 154 04/14/2015   HDL 26.90* 04/14/2015   LDLCALC 114* 04/14/2015   LDLDIRECT 148.4 08/06/2013   TRIG 66.0 04/14/2015   CHOLHDL 6 04/14/2015   .  still has relatively low energy after her atrial fibrillation ablation  EXAM:  BP 118/68 mmHg  Pulse 79  Temp(Src) 97.6 F (36.4 C)  Resp 14  Ht $R'5\' 4"'YP$  (1.626 m)  Wt 135 lb 12.8 oz (61.598 kg)  BMI 23.30 kg/m2  SpO2 97%   no ankle  edema  ASSESSMENT:  DIABETES: Her blood sugars are much better since her last visit even though she has not changed her basal rate as directed She thinks she has had less stress and maybe a little more insulin sensitive now See history of present illness for detailed discussion of current blood sugar levels, abnormal patterns, problems identified  Her blood sugars are mostly higher waking up  especially in the last few days She does not know how to adjust her basal rates on her own and needs help with programming her pump also She is doing very well with compliance as far as management, blood sugar monitoring and taking correction boluses More recently has not had any overcorrection with adjusting her sensitivity on the previous visit Discussed continuous glucose monitoring and gave her brochures on DexCom  Have recommended that she change her basal rate at 4 AM to 0.95 and this was done in the office   HYPERTENSION:  Fairly well controlled   HYPERLIPIDEMIA: Will need to follow-up her lipids on her next visit  HYPONATREMIA: Appears to be resolving, not drug related    Hypothyroidism: adequately supplemented .  However suggested that since she is getting an average of 45 mg Armour thyroid daily she should use a half of a 90 mg on a daily basis to keep her thyroid levels consistent and avoid overtreatment on the days she takes a higher dose.  She will recommend this to her wellness physician  Counseling time on subjects discussed above is over 50% of today's 25 minute visit  Hatice Bubel 05/31/2015, 9:27 PM

## 2015-06-01 LAB — FLECAINIDE LEVEL: Flecainide: <0.1 ug/mL (ref 0.20–1.00)

## 2015-06-07 ENCOUNTER — Ambulatory Visit: Payer: Medicare Other | Admitting: Internal Medicine

## 2015-07-06 ENCOUNTER — Encounter: Payer: Self-pay | Admitting: *Deleted

## 2015-07-11 ENCOUNTER — Encounter: Payer: Self-pay | Admitting: Internal Medicine

## 2015-07-11 ENCOUNTER — Ambulatory Visit (INDEPENDENT_AMBULATORY_CARE_PROVIDER_SITE_OTHER): Payer: Medicare Other | Admitting: Internal Medicine

## 2015-07-11 VITALS — BP 136/62 | HR 70 | Ht 64.0 in | Wt 139.1 lb

## 2015-07-11 DIAGNOSIS — I4891 Unspecified atrial fibrillation: Secondary | ICD-10-CM

## 2015-07-11 DIAGNOSIS — I10 Essential (primary) hypertension: Secondary | ICD-10-CM

## 2015-07-11 DIAGNOSIS — I483 Typical atrial flutter: Secondary | ICD-10-CM

## 2015-07-11 NOTE — Patient Instructions (Addendum)
Medication Instructions: 1) In 6 weeks (around11/7/16) decrease flecainide to 50 mg 1/2 tablet (25 mg) by mouth twice daily  Labwork: - none  Procedures/Testing: - none  Follow-Up: - Your physician recommends that you schedule a follow-up appointment in: 3 months with Dr. Rayann Heman  - Your physician wants you to follow-up in: 6 months with Dr. Debara Pickett. You will receive a reminder letter in the mail two months in advance. If you don't receive a letter, please call our office to schedule the follow-up appointment.  Any Additional Special Instructions Will Be Listed Below (If Applicable). - none

## 2015-07-11 NOTE — Progress Notes (Signed)
PCP: Odette Fraction, MD Primary Cardiologist:  Dr Arlan Organ Gina [name] is a 69 y.o. female who presents today for routine electrophysiology followup.  Since her ablation, the patient reports doing much better.  She had weakness and dizziness early post ablation which has now resolved.  She is much more active and she/her husband are very pleased.  Today, she denies symptoms of palpitations, chest pain, shortness of breath,  lower extremity edema, dizziness, presyncope, or syncope.  The patient is otherwise without complaint today.   Past Medical History  Diagnosis Date  . Ovarian cyst   . Hypertension   . Insulin pump in place   . 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 Anxiety     new dx  . Hyperlipidemia   . S/P radiation therapy 04/10/12- 05/26/2012    left Breast and Axilla / 50 gy / 25 Fractions with Left Breast Boost / 10 Gy / 5 Fractions  . Use of letrozole (Femara) 06/13/12  . Diabetes mellitus type 1     Insulin pump  . Hypothyroidism   . Diastolic CHF     a. 03/8031 - due to AF RVR;  b. 03/2015 Echo: EF 60-65%, no rwma.  . Atrial fibrillation     a. 03/2015 s/p RFCA;  b. CHA2DS2VASc = 5-->Eliquis.  Marland Kitchen SIADH (syndrome of inappropriate ADH production)    Past Surgical History  Procedure Laterality Date  . Tonsillectomy    . Ovarian cyst surgery    . Colonoscopy      1 polyp   . 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 Appendectomy    . Tee without cardioversion N/A 04/07/2015    Procedure: TRANSESOPHAGEAL ECHOCARDIOGRAM (TEE);  Surgeon: Thayer Headings, MD;  Location: [facility];  Service: Cardiovascular;  Laterality: N/A;  . Electrophysiologic study N/A 04/08/2015    PVI + CTI ablation + posterior Box lesion + Atach  ablation by [name]    ROS- all systems are reviewed and negatives except as per HPI above  Current Outpatient Prescriptions  Medication Sig Dispense Refill  . apixaban (ELIQUIS) 5 MG TABS tablet Take 1 tablet (5 mg total) by mouth 2 (two) times daily. 60 tablet 11  . Ascorbic Acid (VITAMIN C) 1000 MG tablet Take 1,000 mg by mouth 2 (two) times daily.     . Cholecalciferol (VITAMIN D PO) Take 5,000 mcg by mouth daily.    . Clobetasol Propionate (TEMOVATE EX) Apply 1 application topically 2 (two) times daily.    Marland Kitchen Cod Liver Oil 1000 MG CAPS Take 2,000 mg by mouth daily.     . Coenzyme Q10 (COQ10) 200 MG CAPS Take 200 mg by mouth daily.    . Cyanocobalamin (VITAMIN B-12) 5000 MCG SUBL Place 5,000 mcg under the tongue daily.    Marland Kitchen diltiazem (CARDIZEM CD) 240 MG 24 hr capsule Take 1 capsule (240 mg total) by mouth daily. 30 capsule 11  . flecainide (TAMBOCOR) 50 MG tablet Take 50 mg by mouth 2 (two) times daily.     Marland Kitchen glucosamine-chondroitin 500-400 MG tablet Take 1 tablet by mouth daily.    Marland Kitchen HUMALOG 100 UNIT/ML injection BASAL RATE 0.9 7AM-12, 0.8 NOON-7AM. MAX OF 60 UNITS DAILY WITH PUMP PER MD 20 mL 5  .  insulin lispro (HUMALOG) 100 UNIT/ML injection as directed. Per sliding scale    . Magnesium 400 MG CAPS Take 400 mg by mouth daily.    . Misc Natural Products (PROGESTERONE EX) Apply 1 ml to skin daily    . NALTREXONE HCL PO Take 4.5 mg by mouth daily.    . NON FORMULARY Take 1 tablet by mouth 3 (three) times daily. Provicor 420 mg    . OVER THE COUNTER MEDICATION Take 1 tablet by mouth daily. DIM Supplement    . RESVERATROL PO Take 250 mg by mouth daily.     Marland Kitchen thyroid (ARMOUR) 30 MG tablet Take 30 mg by mouth every other day.     . thyroid (ARMOUR) 60 MG tablet Take 60 mg by mouth every other day. Alternate with a 30 mg tablet once daily     No current facility-administered medications for this visit.   Facility-Administered Medications Ordered in Other Visits  Medication Dose Route  Frequency Provider Last Rate Last Dose  . topical emolient (BIAFINE) emulsion   Topical Daily Eppie Gibson, MD        Physical Exam: Filed Vitals:   07/11/15 1101  BP: 136/62  Pulse: 70  Height: _0  (1.626 m)  Weight: 139 lb 1.9 oz (63.104 kg)    GEN- The patient is well appearing, alert and oriented x 3 today.   Head- normocephalic, atraumatic Eyes-  Sclera clear, conjunctiva pink Ears- hearing intact Oropharynx- clear Lungs- Clear to ausculation bilaterally, normal work of breathing Heart- Regular rate and rhythm, no murmurs, rubs or gallops, PMI not laterally displaced GI- soft, NT, ND, + BS Extremities- no clubbing, cyanosis, 1+ edema  ekg today reveal sinus rhythm 70 bpm  Assessment and Plan:  1. Afib/ atrial flutter/atch Extensive ablation required 6/16 Doing well at this time  Would not make any changes today Reduce flecainide to 42m BID in 6 weeks if no further arrhythmias Could stop flecainide if doing well upon return in 3 months chads2vasc score is at least 5.  Continue long term anticoagulation  2. htn Stable No change required today  Return to see me in [id number]-up with Dr HiDebara Picketts 6 months  JaThompson GrayerD, FASimpson General Hospital/26/2016 11:15 AM

## 2015-07-13 ENCOUNTER — Ambulatory Visit: Payer: Medicare Other | Admitting: Internal Medicine

## 2015-07-28 ENCOUNTER — Other Ambulatory Visit (INDEPENDENT_AMBULATORY_CARE_PROVIDER_SITE_OTHER): Payer: Medicare Other

## 2015-07-28 DIAGNOSIS — E1065 Type 1 diabetes mellitus with hyperglycemia: Secondary | ICD-10-CM

## 2015-07-28 DIAGNOSIS — IMO0002 Reserved for concepts with insufficient information to code with codable children: Secondary | ICD-10-CM

## 2015-07-28 DIAGNOSIS — E7800 Pure hypercholesterolemia, unspecified: Secondary | ICD-10-CM

## 2015-07-28 LAB — LIPID PANEL
CHOL/HDL RATIO: 4
Cholesterol: 282 mg/dL — ABNORMAL HIGH (ref 0–200)
HDL: 71.8 mg/dL (ref >39.00)
LDL Cholesterol: 198 mg/dL — ABNORMAL HIGH (ref 0–99)
NONHDL: 210.24
TRIGLYCERIDES: 61 mg/dL (ref 0.0–149.0)
VLDL: 12.2 mg/dL (ref 0.0–40.0)

## 2015-07-28 LAB — HEMOGLOBIN A1C: Hgb A1c MFr Bld: 6.7 % — ABNORMAL HIGH (ref 4.6–6.5)

## 2015-08-02 ENCOUNTER — Encounter: Payer: Self-pay | Admitting: Endocrinology

## 2015-08-02 ENCOUNTER — Ambulatory Visit (INDEPENDENT_AMBULATORY_CARE_PROVIDER_SITE_OTHER): Payer: Medicare Other | Admitting: Endocrinology

## 2015-08-02 VITALS — BP 116/80 | HR 79 | Temp 98.2°F | Resp 14 | Ht 64.0 in | Wt 142.4 lb

## 2015-08-02 DIAGNOSIS — E7800 Pure hypercholesterolemia, unspecified: Secondary | ICD-10-CM | POA: Diagnosis not present

## 2015-08-02 DIAGNOSIS — E038 Other specified hypothyroidism: Secondary | ICD-10-CM | POA: Diagnosis not present

## 2015-08-02 DIAGNOSIS — E1065 Type 1 diabetes mellitus with hyperglycemia: Secondary | ICD-10-CM

## 2015-08-02 DIAGNOSIS — E063 Autoimmune thyroiditis: Secondary | ICD-10-CM

## 2015-08-02 MED ORDER — ROSUVASTATIN CALCIUM 5 MG PO TABS: 5.0000 mg | ORAL_TABLET | Freq: Every day | ORAL | Status: DC

## 2015-08-02 NOTE — Progress Notes (Signed)
Patient ID: Gina [name], female   DOB: [birth year], 69 y.o.   MRN: [id number]   Reason for Appointment: Insulin Pump followup:   History of Present Illness   Diagnosis: Type 1 DIABETES MELITUS, date of diagnosis:  1984     CURRENT insulin pump:  One Touch Ping, has been on a pump since 1995  HISTORY:   The pump SETTINGS are: Midnight = 0.45, 4 AM 0.95  7 AM = 1.0; 10 AM = 1.1, 12 noon = 0.9, 4 PM = 0.8 and 9 PM = 0.75.  Total insulin dose about  33 units per day with 57% in basal Carbohydrate ratio 1:8.  Blood sugar target 110 .  Correction factor 1: 30  Previous A1c had been significantly higher at 8% and this was subsequent to her ablation procedure for atrial fibrillation and decreased activity level Basal rates were increased early morning because of high fasting readings   She has difficulty programming the pump on her own and needs help with this.    A1c is markedly better at 6.7 without excessive hypoglycemia  Current blood sugar patterns and problems identified:   Fasting blood sugars are somewhat variable but overall well controlled; has occasional unexpected high readings including this morning.   Her blood sugars are generally lower before lunch and this is probably from her being active summary: Has had a couple of days with blood sugars in the 50s and some in the 60s also.   She does not adjust her basal rate or boluses when she is planning to be active between breakfast and lunch. A couple of times he has had low sugars after her morning bolus.   Blood sugars are generally trending to be higher in the afternoons and only improved when she is less active    Her postprandial readings after supper are excellent usually    She usually is still eating small amount of carbohydrates, on an average only 33 g although not clear if she may not be entering carbohydrates on the time  GLUCOSE CONTROL with the pump is assessed today by pump download  Mean values  apply above for all meters except median for One Touch  PRE-MEAL Fasting Lunch Dinner Bedtime Overall  Glucose range: 82-236   68- 219  84- 344  70- 150   Mean/median:  150  133  168  146  163+/-67   EXERCISE:  Starting to be more active outside recently especially before lunch  Wt Readings from Last 3 Encounters:  08/02/15 142 lb 6.4 oz (64.592 kg)  07/11/15 139 lb 1.9 oz (63.104 kg)  05/31/15 135 lb 12.8 oz (61.598 kg)    LABS:    previous lowest A1c 6.8 from outside lab on 09/13/14  Lab Results  Component Value Date   HGBA1C 6.7* 07/28/2015   HGBA1C 8.0* 04/14/2015   HGBA1C 7.1* 01/11/2015   Lab Results  Component Value Date   MICROALBUR 0.8 04/14/2015   LDLCALC 198* 07/28/2015   CREATININE 0.62 05/26/2015   Appointment on 07/28/2015  Component Date Value Ref Range Status  . Hgb A1c MFr Bld 07/28/2015 6.7* 4.6 - 6.5 % Final   Glycemic Control Guidelines for People with Diabetes:Non Diabetic:  <6%Goal of Therapy: <7%Additional Action Suggested:  >8%   . Cholesterol 07/28/2015 282* 0 - 200 mg/dL Final   ATP III Classification       Desirable:  < 200 mg/dL  Borderline High:  200 - 239 mg/dL          High:  > = 240 mg/dL  . Triglycerides 07/28/2015 61.0  0.0 - 149.0 mg/dL Final   Normal:  <150 mg/dLBorderline High:  150 - 199 mg/dL  . HDL 07/28/2015 71.80  >39.00 mg/dL Final  . VLDL 07/28/2015 12.2  0.0 - 40.0 mg/dL Final  . LDL Cholesterol 07/28/2015 198* 0 - 99 mg/dL Final  . Total CHOL/HDL Ratio 07/28/2015 4   Final                  Men          Women1/2 Average Risk     3.4          3.3Average Risk          5.0          4.42X Average Risk          9.6          7.13X Average Risk          15.0          11.0                      . NonHDL 07/28/2015 210.24   Final   NOTE:  Non-HDL goal should be 30 mg/dL higher than patient's LDL goal (i.e. LDL goal of < 70 mg/dL, would have non-HDL goal of < 100 mg/dL)       Medication List       This list is accurate  as of: 08/02/15  9:59 AM.  Always use your most recent med list.               apixaban 5 MG Tabs tablet  Commonly known as:  ELIQUIS  Take 1 tablet (5 mg total) by mouth 2 (two) times daily.     Cod Liver Oil 1000 MG Caps  Take 2,000 mg by mouth daily.     CoQ10 200 MG Caps  Take 200 mg by mouth daily.     diltiazem 240 MG 24 hr capsule  Commonly known as:  CARDIZEM CD  Take 1 capsule (240 mg total) by mouth daily.     flecainide 50 MG tablet  Commonly known as:  TAMBOCOR  Take 1/2 tablet (25 mg) by mouth twice daily     glucosamine-chondroitin 500-400 MG tablet  Take 1 tablet by mouth daily.     insulin lispro 100 UNIT/ML injection  Commonly known as:  HUMALOG  as directed. Per sliding scale     HUMALOG 100 UNIT/ML injection  Generic drug:  insulin lispro  BASAL RATE 0.9 7AM-12, 0.8 NOON-7AM. MAX OF 60 UNITS DAILY WITH PUMP PER MD     Magnesium 400 MG Caps  Take 400 mg by mouth daily.     NALTREXONE HCL PO  Take 4.5 mg by mouth daily.     NON FORMULARY  Take 1 tablet by mouth 3 (three) times daily. Provicor 420 mg     OVER THE COUNTER MEDICATION  Take 1 tablet by mouth daily. DIM Supplement     PROGESTERONE EX  Apply 1 ml to skin daily     RESVERATROL PO  Take 250 mg by mouth daily.     TEMOVATE EX  Apply 1 application topically 2 (two) times daily.     thyroid 60 MG tablet  Commonly known as:  ARMOUR  Take 60 mg by mouth  every other day. Alternate with a 30 mg tablet once daily     ARMOUR THYROID 90 MG tablet  Generic drug:  thyroid  Take 45 mg by mouth daily. Has not started this yet.     thyroid 30 MG tablet  Commonly known as:  ARMOUR  Take 30 mg by mouth every other day.     Vitamin B-12 5000 MCG Subl  Place 5,000 mcg under the tongue daily.     vitamin C 1000 MG tablet  Take 1,000 mg by mouth 2 (two) times daily.     VITAMIN D PO  Take 5,000 mcg by mouth daily.        Allergies:  Allergies  Allergen Reactions  . Statins Other  (See Comments)    Leg pain    Past Medical History  Diagnosis Date  . Ovarian cyst   . Hypertension   . Insulin pump in place   . 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 Anxiety     new dx  . Hyperlipidemia   . S/P radiation therapy 04/10/12- 05/26/2012    left Breast and Axilla / 50 gy / 25 Fractions with Left Breast Boost / 10 Gy / 5 Fractions  . Use of letrozole (Femara) 06/13/12  . Diabetes mellitus type 1 (HCC)     Insulin pump  . Hypothyroidism   . Diastolic CHF ([place])     a. 12/2014 - due to AF RVR;  b. 03/2015 Echo: EF 60-65%, no rwma.  . Atrial fibrillation ([place])     a. 03/2015 s/p RFCA;  b. CHA2DS2VASc = 5-->Eliquis.  Marland Kitchen SIADH (syndrome of inappropriate ADH production) [facility])     Past Surgical History  Procedure Laterality Date  . Tonsillectomy    . Ovarian cyst surgery    . Colonoscopy      1 polyp   . 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 Appendectomy    . Tee without cardioversion N/A 04/07/2015    Procedure: TRANSESOPHAGEAL ECHOCARDIOGRAM (TEE);  Surgeon: Thayer Headings, MD;  Location: [place];  Service: Cardiovascular;  Laterality: N/A;  . Electrophysiologic study N/A 04/08/2015    PVI + CTI ablation + posterior Box lesion + Atach ablation by [name]    Family History  Problem Relation Age of Onset  . Breast cancer Mother 61  . Prostate cancer Maternal Uncle     died in his 54s  . Heart failure Mother   . Heart attack Father   . Diabetes Maternal Uncle 15    Social History:  reports that she has never smoked. She does not have any smokeless tobacco history on file. She reports that she does not drink alcohol or use illicit drugs.  REVIEW of systems:   She has a history of mild  hypothyroidism followed by a holistic physician Not clear why she is getting a regimen of 60 mg of Armour Thyroid alternating with 30 mg but her TSH is recently normal  She was recommended trying a half of 90 mg daily but has not done so  Lab Results  Component Value Date   TSH 0.624 05/26/2015    HYPERCHOLESTEROLEMIA: Her LDL has been persistently high and refuses to take a statin drug including pravastatin even though  it had no side effects previously.  She was previously trying to use red rice yeast , none now She was told by her wellness  physician that she does not need any  treatment because her particle size is normal   Her belief is still that she does not have small particles and does not need medications  However her LDL particle number is consistently high at 1621  LDL was better in 6/16 especially with her significant weight loss; it is now markedly increased at 198   Lab Results  Component Value Date   CHOL 282* 07/28/2015   HDL 71.80 07/28/2015   LDLCALC 198* 07/28/2015   LDLDIRECT 148.4 08/06/2013   TRIG 61.0 07/28/2015   CHOLHDL 4 07/28/2015   .  EXAM:  BP 116/80 mmHg  Pulse 79  Temp(Src) 98.2 F (36.8 C)  Resp 14  Ht $R'5\' 4"'iy$  (1.626 m)  Wt 142 lb 6.4 oz (64.592 kg)  BMI 24.43 kg/m2  SpO2 96%   no edema  ASSESSMENT:  DIABETES: Her blood sugars are overall fairly good with A1c 6.7 See history of present illness for detailed discussion of his current management, blood sugar patterns and problems identified She does have some fluctuation in her blood sugars especially in the afternoons She tends to have relatively low sugars at lunchtime along with some hypoglycemia partly from being more active She does not try to adjust her boluses or basal based on her activity level Also may be getting some rebound from low normal blood sugars at lunchtime  RECOMMENDATIONS:  Reduce basal rate at 10 AM to 1.0  She will continue her basal rate from noon until 6 PM  instead of changing it at 4 PM.  Reduce boluses at breakfast if planning to be active  May consider temporary basal rate also  Call if blood sugars still high in the afternoon  Reduce carbohydrate coverage at breakfast to 1:10  Consider continuous glucose monitoring when it is covered by her insurance Pump settings were changed for the patient in the office  HYPERTENSION:  well controlled   HYPERLIPIDEMIA: Discussed in detail again the need for management with medications to control her significantly high LDL in particle number Also reminded her that she already did take a statin drug with red rice yeast and the prescription will be a more potent statin which will provide better control than cardio vascular protection Given her a handout on LDL particle number and her lab results Prescription for Crestor 5 mg sent. She is still very unwilling to start medications   Will need to follow-up her lipids on her next visit   Counseling time on subjects discussed above is over 50% of today's 25 minute visit  Bexton Haak 08/02/2015, 9:59 AM

## 2015-08-02 NOTE — Patient Instructions (Signed)
Appointment on 07/28/2015  Component Date Value Ref Range Status  . Hgb A1c MFr Bld 07/28/2015 6.7* 4.6 - 6.5 % Final   Glycemic Control Guidelines for People with Diabetes:Non Diabetic:  <6%Goal of Therapy: <7%Additional Action Suggested:  >8%   . Cholesterol 07/28/2015 282* 0 - 200 mg/dL Final   ATP III Classification       Desirable:  < 200 mg/dL               Borderline High:  200 - 239 mg/dL          High:  > = 240 mg/dL  . Triglycerides 07/28/2015 61.0  0.0 - 149.0 mg/dL Final   Normal:  <150 mg/dLBorderline High:  150 - 199 mg/dL  . HDL 07/28/2015 71.80  >39.00 mg/dL Final  . VLDL 07/28/2015 12.2  0.0 - 40.0 mg/dL Final  . LDL Cholesterol 07/28/2015 198* 0 - 99 mg/dL Final  . Total CHOL/HDL Ratio 07/28/2015 4   Final                  Men          Women1/2 Average Risk     3.4          3.3Average Risk          5.0          4.42X Average Risk          9.6          7.13X Average Risk          15.0          11.0                      . NonHDL 07/28/2015 210.24   Final   NOTE:  Non-HDL goal should be 30 mg/dL higher than patient's LDL goal (i.e. LDL goal of < 70 mg/dL, would have non-HDL goal of < 100 mg/dL)    Reduce or skip bolus when planning to be active  New pump settings done

## 2015-10-13 ENCOUNTER — Encounter: Payer: Self-pay | Admitting: Internal Medicine

## 2015-10-13 ENCOUNTER — Ambulatory Visit (INDEPENDENT_AMBULATORY_CARE_PROVIDER_SITE_OTHER): Payer: Medicare Other | Admitting: Internal Medicine

## 2015-10-13 VITALS — BP 124/62 | HR 75 | Ht 64.0 in | Wt 146.6 lb

## 2015-10-13 DIAGNOSIS — I4891 Unspecified atrial fibrillation: Secondary | ICD-10-CM | POA: Diagnosis not present

## 2015-10-13 DIAGNOSIS — I10 Essential (primary) hypertension: Secondary | ICD-10-CM | POA: Diagnosis not present

## 2015-10-13 NOTE — Patient Instructions (Signed)
Medication Instructions:  Your physician has recommended you make the following change in your medication:  1) Stop Flecainide   Labwork: None ordered   Testing/Procedures: None ordered   Follow-Up: Your physician recommends that you schedule a follow-up appointment in: 3 months with [name]   Any Other Special Instructions Will Be Listed Below (If Applicable).     If you need a refill on your cardiac medications before your next appointment, please call your pharmacy.

## 2015-10-15 NOTE — Progress Notes (Signed)
PCP: Gina Fraction, MD Primary Cardiologist:  Dr Arlan Organ Gina [name] is a 69 y.o. female who presents today for routine electrophysiology followup.  Since her ablation, the patient reports doing much better.   She is much more active and she/her husband are very pleased.   She has had no further arrhythmias. Today, she denies symptoms of palpitations, chest pain, shortness of breath,  lower extremity edema, dizziness, presyncope, or syncope.  The patient is otherwise without complaint today.   Past Medical History  Diagnosis Date  . Ovarian cyst   . Hypertension   . Insulin pump in place   . 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 Anxiety     new dx  . Hyperlipidemia   . S/P radiation therapy 04/10/12- 05/26/2012    left Breast and Axilla / 50 gy / 25 Fractions with Left Breast Boost / 10 Gy / 5 Fractions  . Use of letrozole (Femara) 06/13/12  . Diabetes mellitus type 1 (HCC)     Insulin pump  . Hypothyroidism   . Diastolic CHF ([place])     a. 12/2014 - due to AF RVR;  b. 03/2015 Echo: EF 60-65%, no rwma.  . Atrial fibrillation ([place])     a. 03/2015 s/p RFCA;  b. CHA2DS2VASc = 5-->Eliquis.  Marland Kitchen SIADH (syndrome of inappropriate ADH production) [facility])    Past Surgical History  Procedure Laterality Date  . Tonsillectomy    . Ovarian cyst surgery    . Colonoscopy      1 polyp   . 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 Appendectomy    . Tee without cardioversion N/A 04/07/2015    Procedure: TRANSESOPHAGEAL ECHOCARDIOGRAM (TEE);  Surgeon: Thayer Headings, MD;  Location: [place];  Service: Cardiovascular;  Laterality: N/A;  . Electrophysiologic study N/A 04/08/2015    PVI + CTI ablation + posterior Box lesion + Atach ablation by  [name]    ROS- all systems are reviewed and negatives except as per HPI above  Current Outpatient Prescriptions  Medication Sig Dispense Refill  . apixaban (ELIQUIS) 5 MG TABS tablet Take 1 tablet (5 mg total) by mouth 2 (two) times daily. 60 tablet 11  . ARMOUR THYROID 90 MG tablet Take 45 mg by mouth daily. Has not started this yet.  0  . Ascorbic Acid (VITAMIN C) 1000 MG tablet Take 1,000 mg by mouth 2 (two) times daily.     . Cholecalciferol (VITAMIN D PO) Take 5,000 mcg by mouth daily.    . Clobetasol Propionate (TEMOVATE EX) Apply 1 application topically 2 (two) times daily.    Marland Kitchen Cod Liver Oil 1000 MG CAPS Take 2,000 mg by mouth daily.     . Coenzyme Q10 (COQ10) 200 MG CAPS Take 200 mg by mouth daily.    . Cyanocobalamin (VITAMIN B-12) 5000 MCG SUBL Place 5,000 mcg under the tongue daily.    Marland Kitchen diltiazem (CARDIZEM CD) 240 MG 24 hr capsule Take 1 capsule (240 mg total) by mouth daily. 30 capsule 11  . glucosamine-chondroitin 500-400 MG tablet Take 1 tablet by mouth daily.    Marland Kitchen HUMALOG 100 UNIT/ML injection BASAL RATE 0.9 7AM-12, 0.8 NOON-7AM. MAX OF 60 UNITS DAILY WITH PUMP PER MD 20 mL 5  .  insulin lispro (HUMALOG) 100 UNIT/ML injection as directed. Per sliding scale    . Magnesium 400 MG CAPS Take 400 mg by mouth daily.    . Menaquinone-7 (VITAMIN K2) 40 MCG TABS Take 90 mcg by mouth 2 (two) times daily.    . Misc Natural Products (PROGESTERONE EX) Apply 1 ml to skin daily    . NALTREXONE HCL PO Take 4.5 mg by mouth daily.    . NON FORMULARY Take 1 tablet by mouth 3 (three) times daily. Provicor 420 mg    . OVER THE COUNTER MEDICATION Take 1 tablet by mouth daily. DIM Supplement    . RESVERATROL PO Take 250 mg by mouth daily.     Marland Kitchen thyroid (ARMOUR) 30 MG tablet Take 30 mg by mouth every other day.     . thyroid (ARMOUR) 60 MG tablet Take 60 mg by mouth every other day. Alternate with a 30 mg tablet once daily    . Turmeric Curcumin 500 MG CAPS Take 1 tablet by mouth 3 (three)  times daily.     No current facility-administered medications for this visit.   Facility-Administered Medications Ordered in Other Visits  Medication Dose Route Frequency Provider Last Rate Last Dose  . topical emolient (BIAFINE) emulsion   Topical Daily Eppie Gibson, MD        Physical Exam: Filed Vitals:   10/13/15 1601  BP: 124/62  Pulse: 75  Height: '5\' 4"'$  (1.626 m)  Weight: 146 lb 9.6 oz (66.497 kg)    GEN- The patient is well appearing, alert and oriented x 3 today.   Head- normocephalic, atraumatic Eyes-  Sclera clear, conjunctiva pink Ears- hearing intact Oropharynx- clear Lungs- Clear to ausculation bilaterally, normal work of breathing Heart- Regular rate and rhythm, no murmurs, rubs or gallops, PMI not laterally displaced GI- soft, NT, ND, + BS Extremities- no clubbing, cyanosis, trace edema  ekg today reveal sinus rhythm   Assessment and Plan:  1. Afib/ atrial flutter/atch Extensive ablation required 6/16 No arrhythmias  Stop flecainide chads2vasc score is at least 5.  Continue long term anticoagulation  2. htn Stable No change required today  Return to see me in 87month  JThompson GrayerMD, FSummit Surgical Center LLC12/31/2016 2:28 PM

## 2015-10-28 ENCOUNTER — Other Ambulatory Visit: Payer: Medicare Other

## 2015-11-02 ENCOUNTER — Ambulatory Visit: Payer: Medicare Other | Admitting: Endocrinology

## 2015-11-21 ENCOUNTER — Other Ambulatory Visit (HOSPITAL_COMMUNITY): Payer: Self-pay | Admitting: *Deleted

## 2015-11-21 MED ORDER — DILTIAZEM HCL ER COATED BEADS 240 MG PO CP24: 240.0000 mg | ORAL_CAPSULE | Freq: Every day | ORAL | Status: DC

## 2015-12-02 ENCOUNTER — Telehealth (HOSPITAL_COMMUNITY): Payer: Self-pay | Admitting: *Deleted

## 2015-12-02 MED ORDER — DILTIAZEM HCL ER COATED BEADS 120 MG PO CP24: 120.0000 mg | ORAL_CAPSULE | Freq: Every day | ORAL | Status: DC

## 2015-12-02 NOTE — Telephone Encounter (Signed)
Patient called in stating she has been noticing for a while swelling under eyes and was wondering if Diltiazem could be causing this. Discussed with [name] will try to decrease dose of cardizem to 120mg  once a day and see if this causes improvement with swelling under eyes. Patient was in agreement with this and will call back with update in week or so.

## 2015-12-20 ENCOUNTER — Other Ambulatory Visit (INDEPENDENT_AMBULATORY_CARE_PROVIDER_SITE_OTHER): Payer: Medicare Other

## 2015-12-20 DIAGNOSIS — E063 Autoimmune thyroiditis: Secondary | ICD-10-CM

## 2015-12-20 DIAGNOSIS — E1065 Type 1 diabetes mellitus with hyperglycemia: Secondary | ICD-10-CM | POA: Diagnosis not present

## 2015-12-20 DIAGNOSIS — E038 Other specified hypothyroidism: Secondary | ICD-10-CM | POA: Diagnosis not present

## 2015-12-20 LAB — LIPID PANEL
Cholesterol: 285 mg/dL — ABNORMAL HIGH (ref 0–200)
HDL: 79.6 mg/dL (ref >39.00)
LDL Cholesterol: 197 mg/dL — ABNORMAL HIGH (ref 0–99)
NonHDL: 205.09
TRIGLYCERIDES: 38 mg/dL (ref 0.0–149.0)
Total CHOL/HDL Ratio: 4
VLDL: 7.6 mg/dL (ref 0.0–40.0)

## 2015-12-20 LAB — COMPREHENSIVE METABOLIC PANEL
ALK PHOS: 91 U/L (ref 39–117)
ALT: 27 U/L (ref 0–35)
AST: 22 U/L (ref 0–37)
Albumin: 3.9 g/dL (ref 3.5–5.2)
BUN: 15 mg/dL (ref 6–23)
CALCIUM: 9.4 mg/dL (ref 8.4–10.5)
CO2: 29 mEq/L (ref 19–32)
Chloride: 103 mEq/L (ref 96–112)
Creatinine, Ser: 0.71 mg/dL (ref 0.40–1.20)
GFR: 86.13 mL/min (ref >60.00)
GLUCOSE: 76 mg/dL (ref 70–99)
POTASSIUM: 4.7 meq/L (ref 3.5–5.1)
Sodium: 139 mEq/L (ref 135–145)
Total Bilirubin: 0.4 mg/dL (ref 0.2–1.2)
Total Protein: 6.5 g/dL (ref 6.0–8.3)

## 2015-12-20 LAB — TSH: TSH: 1.32 u[IU]/mL (ref 0.35–4.50)

## 2015-12-20 LAB — HEMOGLOBIN A1C: HEMOGLOBIN A1C: 6.9 % — AB (ref 4.6–6.5)

## 2015-12-23 ENCOUNTER — Encounter: Payer: Self-pay | Admitting: Endocrinology

## 2015-12-23 ENCOUNTER — Ambulatory Visit (INDEPENDENT_AMBULATORY_CARE_PROVIDER_SITE_OTHER): Payer: Medicare Other | Admitting: Endocrinology

## 2015-12-23 VITALS — BP 126/24 | HR 71 | Temp 97.7°F | Resp 14 | Ht 64.0 in | Wt 147.2 lb

## 2015-12-23 DIAGNOSIS — E1065 Type 1 diabetes mellitus with hyperglycemia: Secondary | ICD-10-CM | POA: Diagnosis not present

## 2015-12-23 DIAGNOSIS — E7800 Pure hypercholesterolemia, unspecified: Secondary | ICD-10-CM

## 2015-12-23 DIAGNOSIS — I10 Essential (primary) hypertension: Secondary | ICD-10-CM | POA: Diagnosis not present

## 2015-12-23 MED ORDER — ROSUVASTATIN CALCIUM 5 MG PO TABS: 5.0000 mg | ORAL_TABLET | Freq: Every day | ORAL | Status: DC

## 2015-12-23 NOTE — Progress Notes (Signed)
Patient ID: Gina [name], female   DOB: 12/01/1945, 70 y.o.   MRN: [id number]   Reason for Appointment: Insulin Pump followup:   History of Present Illness   Diagnosis: Type 1 DIABETES MELITUS, date of diagnosis:  1984     CURRENT insulin pump:  One Touch Ping, has been on a pump since 1995  HISTORY:   The pump SETTINGS are: Midnight = 0.45, 4 AM 0.95  7 AM = 1.0;, 12 noon = 0.9, 6 PM = 0.8 and 9 PM = 0.75.  Total insulin dose about  33 units per day with 57% in basal Carbohydrate ratio 1:8 after 12 noon and 1:7 at breakfast.  Blood sugar target 110 .  Correction factor 1: 30    She has some difficulty programming the pump on her own and needs help with this.    A1c is now fairly stable at 6.9 , previously 6.7  Current management, blood sugar patterns and problems identified:   Fasting blood sugars are somewhat high with only one recent good reading of 124  She thinks her blood sugars are higher midmorning even without any food intake and has a couple of readings going up before 12 noon also  She has variable meal times and may not eat breakfast consistently.  POSTPRANDIAL readings appear to be higher mid-day but not after supper are usually  Rarely will forget to bolus when she is eating and has only one significantly high reading of 360 at night  She is trying to walk in the late afternoon but her blood sugar does not go low with this and overall lowest blood sugars are probably around 8 PM  Also may have better readings right about noon time  No hypoglycemia recently   She usually is still eating small amount of carbohydrates, she wants to cut back on carbohydrates more to keep her blood sugar from going up  GLUCOSE CONTROL with the pump is assessed today by pump download  Mean values apply above for all meters except median for One Touch  PRE-MEAL Fasting Lunch Dinner Bedtime Overall  Glucose range:  124-236   98-289   126-266   133-360    Mean/median:   170     197  191 +/-61    EXERCISE:  Starting to be more active outside recently especially before lunch  Wt Readings from Last 3 Encounters:  12/23/15 147 lb 3.2 oz (66.769 kg)  10/13/15 146 lb 9.6 oz (66.497 kg)  08/02/15 142 lb 6.4 oz (64.592 kg)    LABS:   Lab Results  Component Value Date   HGBA1C 6.9* 12/20/2015   HGBA1C 6.7* 07/28/2015   HGBA1C 8.0* 04/14/2015   Lab Results  Component Value Date   MICROALBUR 0.8 04/14/2015   LDLCALC 197* 12/20/2015   CREATININE 0.71 12/20/2015   Lab on 12/20/2015  Component Date Value Ref Range Status  . Hgb A1c MFr Bld 12/20/2015 6.9* 4.6 - 6.5 % Final   Glycemic Control Guidelines for People with Diabetes:Non Diabetic:  <6%Goal of Therapy: <7%Additional Action Suggested:  >8%   . Sodium 12/20/2015 139  135 - 145 mEq/L Final  . Potassium 12/20/2015 4.7  3.5 - 5.1 mEq/L Final  . Chloride 12/20/2015 103  96 - 112 mEq/L Final  . CO2 12/20/2015 29  19 - 32 mEq/L Final  . Glucose, Bld 12/20/2015 76  70 - 99 mg/dL Final  . BUN 12/20/2015 15  6 - 23 mg/dL Final  . Creatinine,  Ser 12/20/2015 0.71  0.40 - 1.20 mg/dL Final  . Total Bilirubin 12/20/2015 0.4  0.2 - 1.2 mg/dL Final  . Alkaline Phosphatase 12/20/2015 91  39 - 117 U/L Final  . AST 12/20/2015 22  0 - 37 U/L Final  . ALT 12/20/2015 27  0 - 35 U/L Final  . Total Protein 12/20/2015 6.5  6.0 - 8.3 g/dL Final  . Albumin 12/20/2015 3.9  3.5 - 5.2 g/dL Final  . Calcium 12/20/2015 9.4  8.4 - 10.5 mg/dL Final  . GFR 12/20/2015 86.13  >60.00 mL/min Final  . Cholesterol 12/20/2015 285* 0 - 200 mg/dL Final   ATP III Classification       Desirable:  < 200 mg/dL               Borderline High:  200 - 239 mg/dL          High:  > = 240 mg/dL  . Triglycerides 12/20/2015 38.0  0.0 - 149.0 mg/dL Final   Normal:  <150 mg/dLBorderline High:  150 - 199 mg/dL  . HDL 12/20/2015 79.60  >39.00 mg/dL Final  . VLDL 12/20/2015 7.6  0.0 - 40.0 mg/dL Final  . LDL Cholesterol 12/20/2015 197* 0 - 99 mg/dL  Final  . Total CHOL/HDL Ratio 12/20/2015 4   Final                  Men          Women1/2 Average Risk     3.4          3.3Average Risk          5.0          4.42X Average Risk          9.6          7.13X Average Risk          15.0          11.0                      . NonHDL 12/20/2015 205.09   Final   NOTE:  Non-HDL goal should be 30 mg/dL higher than patient's LDL goal (i.e. LDL goal of < 70 mg/dL, would have non-HDL goal of < 100 mg/dL)  . TSH 12/20/2015 1.32  0.35 - 4.50 uIU/mL Final       Medication List       This list is accurate as of: 12/23/15 11:47 AM.  Always use your most recent med list.               apixaban 5 MG Tabs tablet  Commonly known as:  ELIQUIS  Take 1 tablet (5 mg total) by mouth 2 (two) times daily.     Cod Liver Oil 1000 MG Caps  Take 2,000 mg by mouth daily.     CoQ10 200 MG Caps  Take 200 mg by mouth daily.     diltiazem 120 MG 24 hr capsule  Commonly known as:  CARDIZEM CD  Take 1 capsule (120 mg total) by mouth daily.     glucosamine-chondroitin 500-400 MG tablet  Take 1 tablet by mouth daily.     insulin lispro 100 UNIT/ML injection  Commonly known as:  HUMALOG  as directed. Reported on 12/23/2015     HUMALOG 100 UNIT/ML injection  Generic drug:  insulin lispro  BASAL RATE 0.9 7AM-12, 0.8 NOON-7AM. MAX OF 60 UNITS DAILY WITH PUMP PER MD  Magnesium 400 MG Caps  Take 400 mg by mouth daily.     NALTREXONE HCL PO  Take 4.5 mg by mouth daily.     NON FORMULARY  Take 1 tablet by mouth 3 (three) times daily. Provicor 420 mg     OVER THE COUNTER MEDICATION  Take 1 tablet by mouth daily. Reported on 12/23/2015     PROGESTERONE EX  Apply 1 ml to skin daily     RESVERATROL PO  Take 250 mg by mouth daily.     rosuvastatin 5 MG tablet  Commonly known as:  CRESTOR  Take 1 tablet (5 mg total) by mouth daily.     TEMOVATE EX  Apply 1 application topically 2 (two) times daily. Reported on 12/23/2015     thyroid 15 MG tablet  Commonly  known as:  ARMOUR  Take 15 mg by mouth daily.     thyroid 30 MG tablet  Commonly known as:  ARMOUR  Take 30 mg by mouth every other day.     Turmeric Curcumin 500 MG Caps  Take 1 tablet by mouth 3 (three) times daily.     Vitamin B-12 5000 MCG Subl  Place 5,000 mcg under the tongue daily.     vitamin C 1000 MG tablet  Take 1,000 mg by mouth 2 (two) times daily.     VITAMIN D PO  Take 5,000 mcg by mouth daily.     Vitamin K2 40 MCG Tabs  Take 90 mcg by mouth 2 (two) times daily.        Allergies:  Allergies  Allergen Reactions  . Statins Other (See Comments)    Leg pain    Past Medical History  Diagnosis Date  . Ovarian cyst   . Hypertension   . Insulin pump in place   . 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 Anxiety     new dx  . Hyperlipidemia   . S/P radiation therapy 04/10/12- 05/26/2012    left Breast and Axilla / 50 gy / 25 Fractions with Left Breast Boost / 10 Gy / 5 Fractions  . Use of letrozole (Femara) 06/13/12  . Diabetes mellitus type 1 (HCC)     Insulin pump  . Hypothyroidism   . Diastolic CHF ([place])     a. 12/2014 - due to AF RVR;  b. 03/2015 Echo: EF 60-65%, no rwma.  . Atrial fibrillation ([place])     a. 03/2015 s/p RFCA;  b. CHA2DS2VASc = 5-->Eliquis.  Marland Kitchen SIADH (syndrome of inappropriate ADH production) [facility])     Past Surgical History  Procedure Laterality Date  . Tonsillectomy    . Ovarian cyst surgery    . Colonoscopy      1 polyp   . 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 Appendectomy    . Tee without cardioversion N/A 04/07/2015    Procedure: TRANSESOPHAGEAL ECHOCARDIOGRAM (TEE);  Surgeon: Thayer Headings, MD;  Location: [place];  Service: Cardiovascular;  Laterality: N/A;  .  Electrophysiologic study N/A 04/08/2015    PVI + CTI ablation + posterior Box lesion + Atach ablation by [name]    Family History  Problem Relation Age of Onset  . Breast cancer Mother 25  . Prostate cancer Maternal  Uncle     died in his 1s  . Heart failure Mother   . Heart attack Father   . Diabetes Maternal Uncle 15    Social History:  reports that she has never smoked. She does not have any smokeless tobacco history on file. She reports that she does not drink alcohol or use illicit drugs.  REVIEW of systems:   She has a history of mild hypothyroidism followed by a holistic physician Again she is getting a regimen of 60 mg of Armour Thyroid alternating with 30 mg but her TSH is recently normal    Lab Results  Component Value Date   TSH 1.32 12/20/2015    HYPERCHOLESTEROLEMIA: Her LDL has been persistently high and refuses to take a statin drug including pravastatin even though it had no side effects previously.  She was previously trying to use red rice yeast , none now She was told by her wellness  physician that she does not need any  treatment because her particle size is normal  She was given a prescription for Crestor on the last visit but she did not start this  She still hesitates about starting medication despite leading to the patient that she has an LDL about twice the required number and this will not improve with diet Her LDL is almost the same as on her last visit  Previously her LDL particle number is consistently high at 1621    Lab Results  Component Value Date   CHOL 285* 12/20/2015   HDL 79.60 12/20/2015   LDLCALC 197* 12/20/2015   LDLDIRECT 148.4 08/06/2013   TRIG 38.0 12/20/2015   CHOLHDL 4 12/20/2015   .  EXAM:  BP 126/24 mmHg  Pulse 71  Temp(Src) 97.7 F (36.5 C)  Resp 14  Ht _0  (1.626 m)  Wt 147 lb 3.2 oz (66.769 kg)  BMI 25.25 kg/m2  SpO2 97%   ASSESSMENT:  DIABETES: Her blood sugars are overall fairly good with A1c  6.9, considering her age and duration of diabetes See history of present illness for detailed discussion of his current management, blood sugar patterns and problems identified She does have some fluctuation in her blood sugars with standard deviation 61 HIGHEST blood sugars are now in the early afternoons and also some sporadically at other times Generally has mild increase in FASTING blood sugars also She usually compliant with checking her blood sugar and dosing but only rarely may forget to bolus with a meal Trying to be active with walking in the late afternoon and weight is stable  No hypoglycemia now  RECOMMENDATIONS:  Increase basal rate at 6 AM up to 1.05  Increase basal rate at 12 noon to 1.0  Carbohydrate coverage between 12 noon and 5 PM to be 1:7  Consistent bolusing  To call if she has persistently high readings or started to get low sugars  Consider continuous glucose monitoring when it is covered by her insurance Pump settings for her boluses were changed for the patient in the office  HYPERTENSION:  well controlled   HYPERLIPIDEMIA: Discussed in detail again the need for lipid management as is caused by various society guidelines LDL is about double of required level and not improving  Prescription for Crestor 5 mg printed and given She can try taking this every other day to start with   Will need to follow-up her lipids on her next visit   Counseling time on subjects discussed above is over 50% of today's 25  minute visit  Gina [name] 12/23/2015, 11:47 AM

## 2016-01-02 ENCOUNTER — Encounter: Payer: Self-pay | Admitting: Internal Medicine

## 2016-01-02 ENCOUNTER — Ambulatory Visit (INDEPENDENT_AMBULATORY_CARE_PROVIDER_SITE_OTHER): Payer: Medicare Other | Admitting: Internal Medicine

## 2016-01-02 VITALS — BP 120/64 | HR 66 | Ht 64.0 in | Wt 145.4 lb

## 2016-01-02 DIAGNOSIS — I4891 Unspecified atrial fibrillation: Secondary | ICD-10-CM | POA: Diagnosis not present

## 2016-01-02 DIAGNOSIS — I10 Essential (primary) hypertension: Secondary | ICD-10-CM | POA: Diagnosis not present

## 2016-01-02 NOTE — Progress Notes (Signed)
PCP: Leo Grosser, MD Primary Cardiologist:  Dr Jodie Echevaria Gina [name] is a 70 y.o. female who presents today for routine electrophysiology followup.   She continues to do well.  She remains active.   She has had no further arrhythmias since her ablation off AAD therapy.   She has some fatigue.  Today, she denies symptoms of palpitations, chest pain, shortness of breath,  lower extremity edema, dizziness, presyncope, or syncope.  The patient is otherwise without complaint today.   Past Medical History  Diagnosis Date  . Ovarian cyst   . Hypertension   . Insulin pump in place   . 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 Anxiety     new dx  . Hyperlipidemia   . S/P radiation therapy 04/10/12- 05/26/2012    left Breast and Axilla / 50 gy / 25 Fractions with Left Breast Boost / 10 Gy / 5 Fractions  . Use of letrozole (Femara) 06/13/12  . Diabetes mellitus type 1 (HCC)     Insulin pump  . Hypothyroidism   . Diastolic CHF (HCC)     a. 12/2014 - due to AF RVR;  b. 03/2015 Echo: EF 60-65%, no rwma.  . Atrial fibrillation (HCC)     a. 03/2015 s/p RFCA;  b. CHA2DS2VASc = 5-->Eliquis.  Marland Kitchen SIADH (syndrome of inappropriate ADH production) [facility])    Past Surgical History  Procedure Laterality Date  . Tonsillectomy    . Ovarian cyst surgery    . Colonoscopy      1 polyp   . 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 Appendectomy    . Tee without cardioversion N/A 04/07/2015    Procedure: TRANSESOPHAGEAL ECHOCARDIOGRAM (TEE);  Surgeon: Vesta Mixer, MD;  Location: [facility] ENDOSCOPY;  Service: Cardiovascular;  Laterality: N/A;  . Electrophysiologic study N/A 04/08/2015    PVI + CTI ablation + posterior Box lesion + Atach ablation by Dr Johney Frame     ROS- all systems are reviewed and negatives except as per HPI above  Current Outpatient Prescriptions  Medication Sig Dispense Refill  . apixaban (ELIQUIS) 5 MG TABS tablet Take 1 tablet (5 mg total) by mouth 2 (two) times daily. 60 tablet 11  . Ascorbic Acid (VITAMIN C) 1000 MG tablet Take 1,000 mg by mouth 2 (two) times daily.     . Cholecalciferol (VITAMIN D PO) Take 5,000 mcg by mouth daily.    Marland Kitchen Cod Liver Oil 1000 MG CAPS Take 2,000 mg by mouth daily.     . Coenzyme Q10 (COQ10) 200 MG CAPS Take 200 mg by mouth daily.    . Cyanocobalamin (VITAMIN B-12) 5000 MCG SUBL Place 5,000 mcg under the tongue daily.    Marland Kitchen glucosamine-chondroitin 500-400 MG tablet Take 1 tablet by mouth daily.    Marland Kitchen HUMALOG 100 UNIT/ML injection BASAL RATE 0.9 7AM-12, 0.8 NOON-7AM. MAX OF 60 UNITS DAILY WITH PUMP PER MD 20 mL 5  . insulin lispro (HUMALOG) 100 UNIT/ML injection as directed. Reported on 12/23/2015    . Magnesium 400 MG CAPS Take 400 mg by mouth daily.    . Menaquinone-7 (VITAMIN K2) 40 MCG TABS Take 90 mcg by mouth 2 (two) times daily.    . Misc Natural Products (PROGESTERONE EX) Apply 1 ml  to skin daily    . NALTREXONE HCL PO Take 4.5 mg by mouth daily.    . NON FORMULARY Take 1 tablet by mouth 3 (three) times daily. Provicor 420 mg    . RESVERATROL PO Take 250 mg by mouth daily.     Marland Kitchen thyroid (ARMOUR) 15 MG tablet Take 15 mg by mouth daily.    Marland Kitchen thyroid (ARMOUR) 30 MG tablet Take 30 mg by mouth every other day.     . Turmeric Curcumin 500 MG CAPS Take 1 tablet by mouth 3 (three) times daily.     No current facility-administered medications for this visit.   Facility-Administered Medications Ordered in Other Visits  Medication Dose Route Frequency Provider Last Rate Last Dose  . topical emolient (BIAFINE) emulsion   Topical Daily Eppie Gibson, MD        Physical Exam: Filed Vitals:   01/02/16 1103  BP: 120/64  Pulse: 66  Height: '5\' 4"'$  (1.626 m)  Weight: 145 lb 6.4 oz (65.953 kg)     GEN- The patient is well appearing, alert and oriented x 3 today.   Head- normocephalic, atraumatic Eyes-  Sclera clear, conjunctiva pink Ears- hearing intact Oropharynx- clear Lungs- Clear to ausculation bilaterally, normal work of breathing Heart- Regular rate and rhythm, no murmurs, rubs or gallops, PMI not laterally displaced GI- soft, NT, ND, + BS Extremities- no clubbing, cyanosis, trace edema  ekg today reveal sinus rhythm  73 bpm, otherwise normal ekg  Assessment and Plan:  1. Afib/ atrial flutter/atch Extensive ablation required 6/16 No arrhythmias off of AAD therapy Stop diltiazem chads2vasc score is at least 5.  Continue long term anticoagulation  2. htn Stable No change required today  Regular exercise encouraged  Return to see [name] in the AF clinic in 37month I will see in 6 months  JThompson GrayerMD, FSpringfield Hospital3/20/2017 12:03 PM

## 2016-01-02 NOTE — Patient Instructions (Signed)
Medication Instructions:  Your physician has recommended you make the following change in your medication:  1) Stop Diltiazem   Labwork: None ordered   Testing/Procedures: None ordered   Follow-Up: Your physician recommends that you schedule a follow-up appointment in: 3 months with [name], NP and 6 months with [name]   Any Other Special Instructions Will Be Listed Below (If Applicable).     If you need a refill on your cardiac medications before your next appointment, please call your pharmacy.

## 2016-01-05 NOTE — Addendum Note (Signed)
Addended by: Freada Bergeron on: 01/05/2016 05:33 PM   Modules accepted: Orders

## 2016-01-06 IMAGING — CT CT ANGIO CHEST
2 of 8 series · 18 of 46 positions shown · IV contrast (OMNI)
Comparison: Chest radiographs from 01/08/2015 through 01/11/2015

CLINICAL DATA: Shortness of breath, mild chest pain. Elevated
D-dimer. History of breast cancer 3 years prior.

EXAM:
CT ANGIOGRAPHY CHEST WITH CONTRAST
TECHNIQUE: Multidetector CT imaging of the chest was performed using the
standard protocol during bolus administration of intravenous
contrast. Multiplanar CT image reconstructions and MIPs were
obtained to evaluate the vascular anatomy.
CONTRAST:  80mL OMNIPAQUE IOHEXOL 350 MG/ML SOLN

[Series 5: thins · axial · 0.61mm/px · z∈[-248,-16]mm · 15 of 257 slices shown]
[im 12/257  lung]
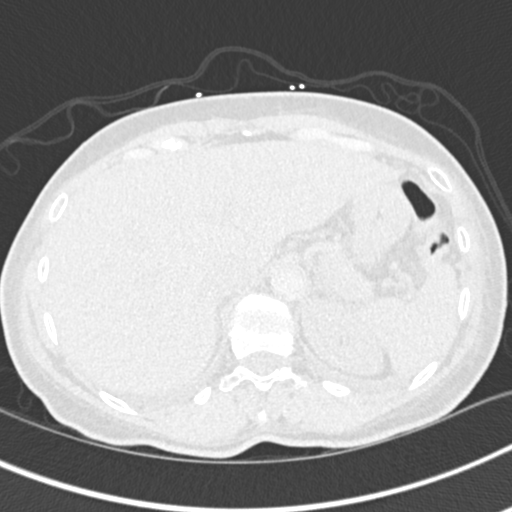
[im 35/257  soft-tissue]
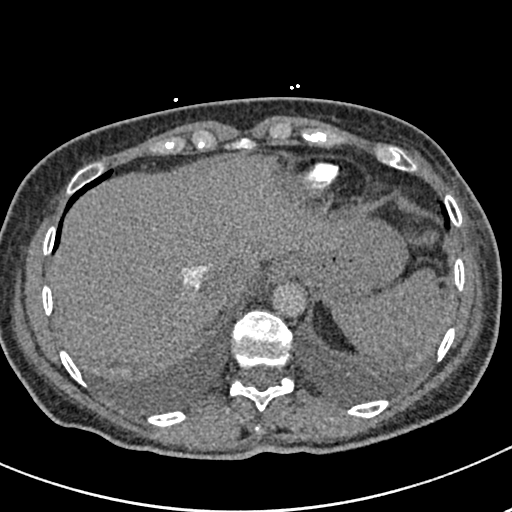
[im 47/257  lung]
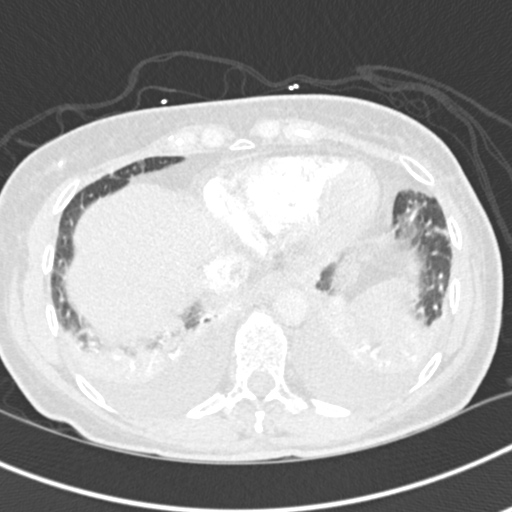
[im 59/257  soft-tissue]
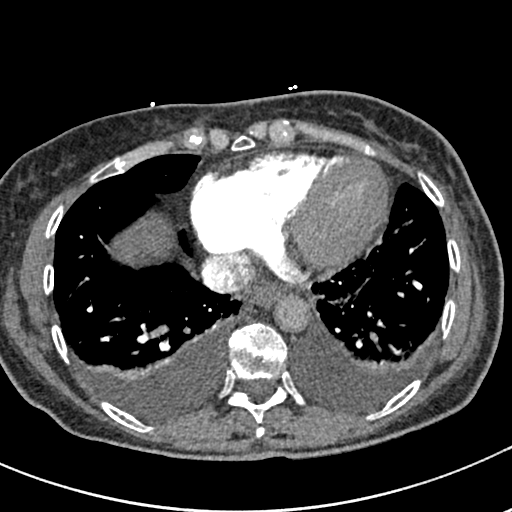
[im 82/257  lung]
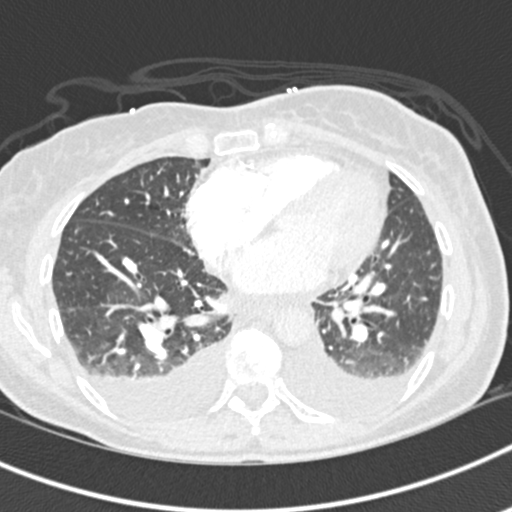
[im 94/257  soft-tissue]
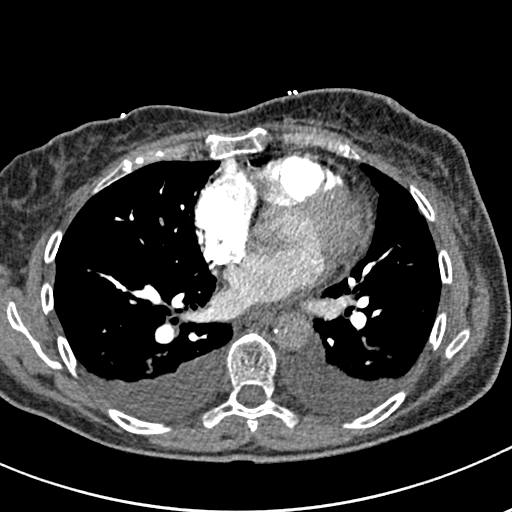
[im 117/257  lung]
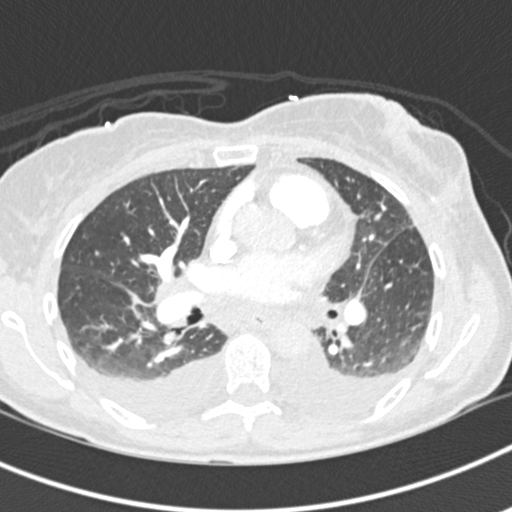
[im 129/257  soft-tissue]
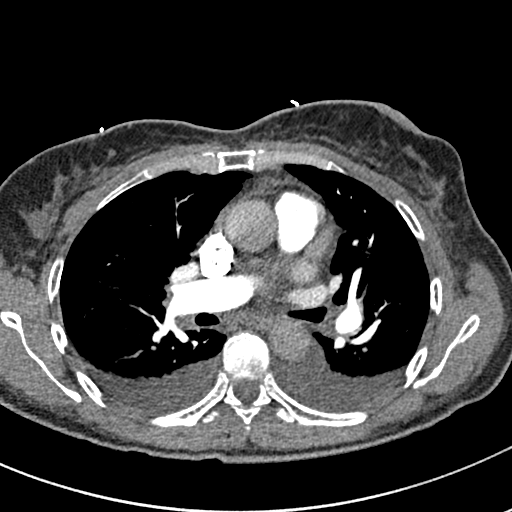
[im 140/257  lung]
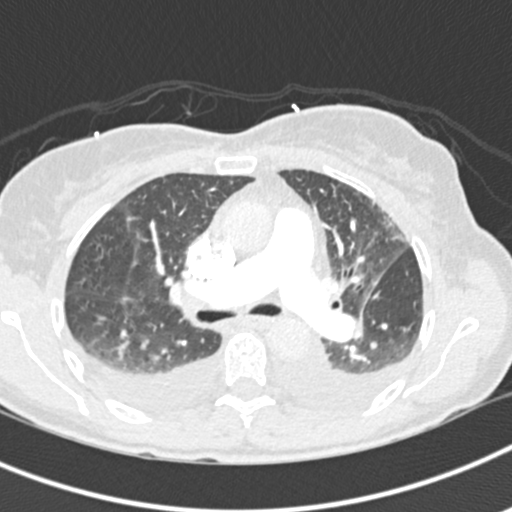
[im 163/257  soft-tissue]
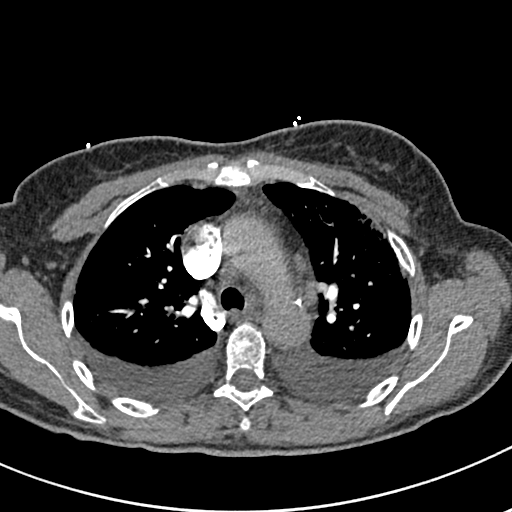
[im 175/257  lung]
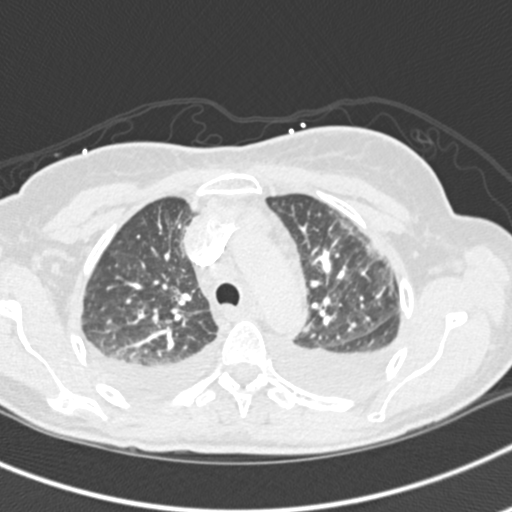
[im 198/257  soft-tissue]
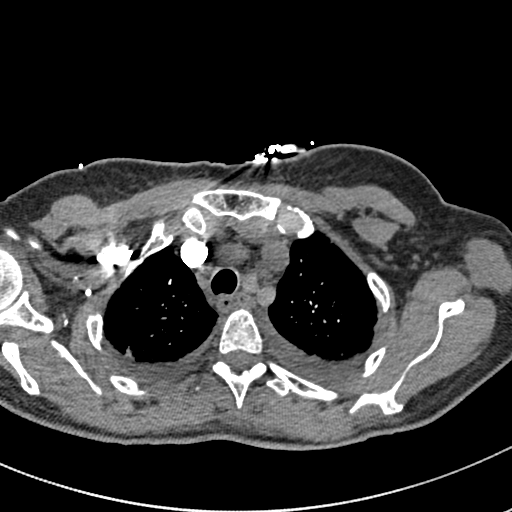
[im 210/257  lung]
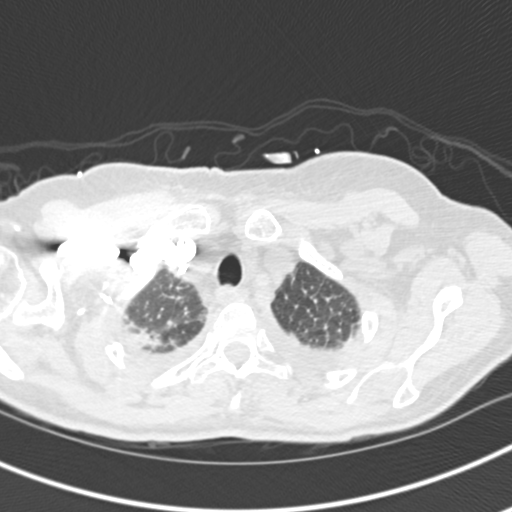
[im 222/257  soft-tissue]
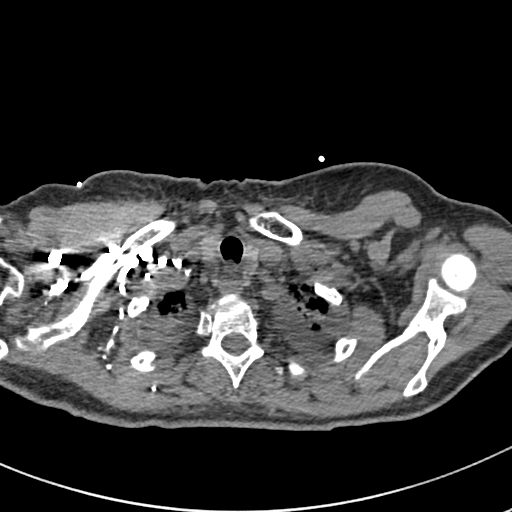
[im 245/257  lung]
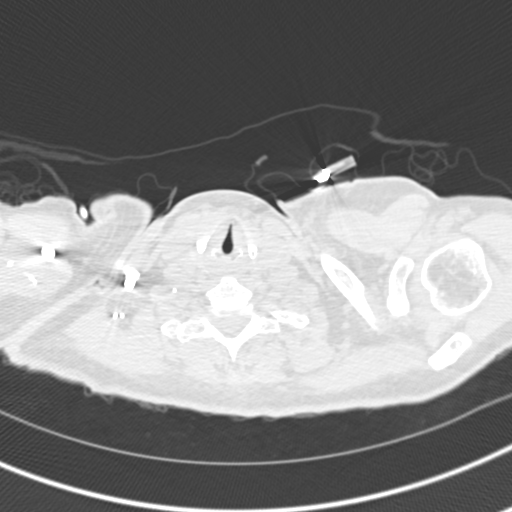

[Series 7: coronal mpr · coronal · 0.59mm/px · 3 of 104 slices shown]
[im 26/104  soft-tissue]
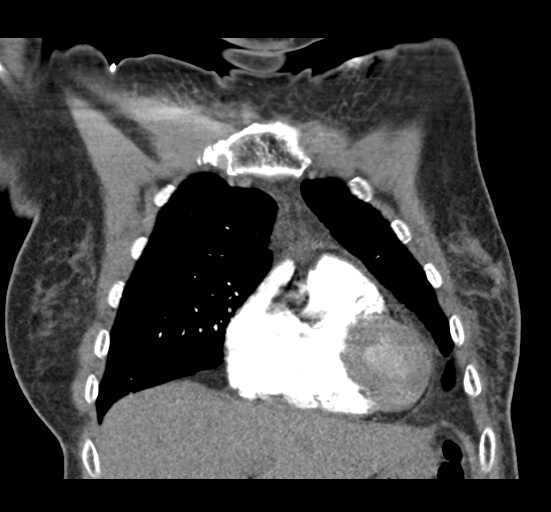
[im 52/104  soft-tissue]
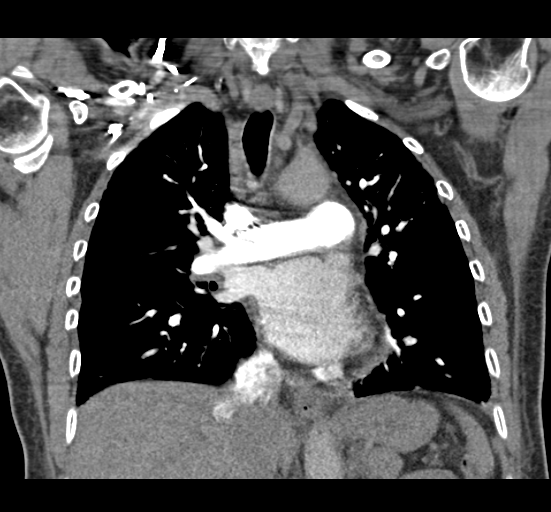
[im 78/104  soft-tissue]
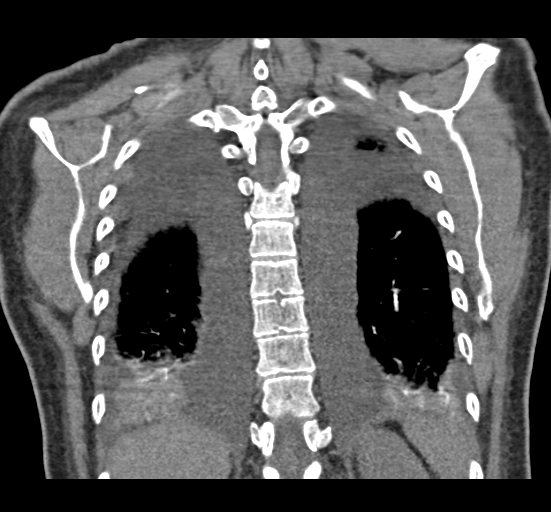

[18 of 46 positions shown; findings below may reference images not displayed]

FINDINGS: There are no filling defects within the pulmonary arteries to
suggest pulmonary embolus.

The thoracic aorta is normal in caliber with mild atherosclerosis.
There is mild diffuse shotty mediastinal and to a lesser extent
hilar adenopathy, for example, lower paratracheal lymph node
measures 8 mm in short axis dimension. Prevascular lymph node
measures 5 mm. There is mild cardiomegaly and contrast refluxing
into the hepatic veins and IVC.

Moderate bilateral pleural effusions with adjacent compressive
atelectasis. No pericardial effusion. Smooth septal thickening and
mild ground-glass opacity, in an upper lobe predominant distribution
consistent with pulmonary edema. Linear reticular opacity in the
anterior left upper lobe in a pattern consistent with post radiation
change. Patient is post left lumpectomy. No discrete pulmonary
nodule.

Hepatic granuloma noted. There is otherwise no acute abnormality in
the included upper abdomen. Surgical clips noted in the inferior
right breast.

There are no acute or suspicious osseous abnormalities. No discrete
lytic or blastic osseous lesion.

Review of the MIP images confirms the above findings.
IMPRESSION: 1. No pulmonary embolus.
2. Findings consistent with congestive heart failure.
3. Shotty mediastinal and bilateral hilar adenopathy. This is likely
reactive related to congestive heart failure.
4. Post left breast lumpectomy with post radiation change in the
left upper lobe.

## 2016-02-08 ENCOUNTER — Other Ambulatory Visit: Payer: Self-pay | Admitting: Endocrinology

## 2016-02-08 ENCOUNTER — Other Ambulatory Visit: Payer: Self-pay | Admitting: Cardiology

## 2016-02-08 ENCOUNTER — Telehealth (HOSPITAL_COMMUNITY): Payer: Self-pay | Admitting: *Deleted

## 2016-02-08 ENCOUNTER — Other Ambulatory Visit (HOSPITAL_COMMUNITY): Payer: Self-pay | Admitting: *Deleted

## 2016-02-08 MED ORDER — APIXABAN 5 MG PO TABS: 5.0000 mg | ORAL_TABLET | Freq: Two times a day (BID) | ORAL | Status: DC

## 2016-02-08 NOTE — Telephone Encounter (Signed)
Pt called in stating she has noticed over the last month an increase in her BP readings -- she does not take her BP every day but sporadically checks it. Back in march it was in the 120s/60s now its running each time she checks it in April in the 150s/80s.  HR is in the 80s.  Patient diltiazem was stopped 01/02/16 per patient complaints of eye swelling she thought was coming from diltiazem.  Pt was on BP medication prior to afib but those medications were stopped when regimen changed for heart rate control.  Dr. Dwyane Dee previously managed her blood pressure so I encouraged her to be in contact with him in regards to BP management. She was on avapro at one point and has some of this at home - questioned if she could just restart this. Again encouraged patient to call Dr. Dwyane Dee to help with management of blood pressure prior to adding medication on herself. Patient verbalized understanding.

## 2016-02-08 NOTE — Telephone Encounter (Signed)
Humalog Refill needs to be sent into [facility]

## 2016-02-10 ENCOUNTER — Other Ambulatory Visit: Payer: Self-pay | Admitting: Endocrinology

## 2016-02-28 ENCOUNTER — Telehealth: Payer: Self-pay | Admitting: Endocrinology

## 2016-02-28 ENCOUNTER — Other Ambulatory Visit: Payer: Self-pay | Admitting: *Deleted

## 2016-02-28 MED ORDER — GLUCOSE BLOOD VI STRP: ORAL_STRIP | Status: DC

## 2016-02-28 NOTE — Telephone Encounter (Signed)
Rx sent, patient is aware. 

## 2016-02-28 NOTE — Telephone Encounter (Signed)
Patient need a prescription for a prodigy autocode meter kit send to  [facility] [place], [place] - [address] [phone] (Phone) [phone] (Fax)       She asked for a call when medication is sent.

## 2016-03-26 ENCOUNTER — Other Ambulatory Visit (INDEPENDENT_AMBULATORY_CARE_PROVIDER_SITE_OTHER): Payer: Medicare Other

## 2016-03-26 DIAGNOSIS — E1065 Type 1 diabetes mellitus with hyperglycemia: Secondary | ICD-10-CM | POA: Diagnosis not present

## 2016-03-26 LAB — COMPREHENSIVE METABOLIC PANEL
ALT: 20 U/L (ref 0–35)
AST: 22 U/L (ref 0–37)
Albumin: 4 g/dL (ref 3.5–5.2)
Alkaline Phosphatase: 70 U/L (ref 39–117)
BUN: 16 mg/dL (ref 6–23)
CALCIUM: 9.1 mg/dL (ref 8.4–10.5)
CHLORIDE: 99 meq/L (ref 96–112)
CO2: 30 meq/L (ref 19–32)
Creatinine, Ser: 0.73 mg/dL (ref 0.40–1.20)
GFR: 83.35 mL/min (ref >60.00)
Glucose, Bld: 134 mg/dL — ABNORMAL HIGH (ref 70–99)
POTASSIUM: 4.3 meq/L (ref 3.5–5.1)
Sodium: 136 mEq/L (ref 135–145)
Total Bilirubin: 0.5 mg/dL (ref 0.2–1.2)
Total Protein: 6.7 g/dL (ref 6.0–8.3)

## 2016-03-26 LAB — LIPID PANEL
CHOLESTEROL: 292 mg/dL — AB (ref 0–200)
HDL: 70.6 mg/dL (ref >39.00)
LDL Cholesterol: 211 mg/dL — ABNORMAL HIGH (ref 0–99)
NonHDL: 221.47
TRIGLYCERIDES: 50 mg/dL (ref 0.0–149.0)
Total CHOL/HDL Ratio: 4
VLDL: 10 mg/dL (ref 0.0–40.0)

## 2016-03-26 LAB — MICROALBUMIN / CREATININE URINE RATIO
CREATININE, U: 98.1 mg/dL
MICROALB/CREAT RATIO: 0.7 mg/g (ref 0.0–30.0)
Microalb, Ur: <0.7 mg/dL (ref 0.0–1.9)

## 2016-03-26 LAB — HEMOGLOBIN A1C: Hgb A1c MFr Bld: 6.7 % — ABNORMAL HIGH (ref 4.6–6.5)

## 2016-03-29 ENCOUNTER — Ambulatory Visit: Payer: Medicare Other | Admitting: Endocrinology

## 2016-04-11 ENCOUNTER — Other Ambulatory Visit: Payer: Self-pay

## 2016-04-11 ENCOUNTER — Encounter (HOSPITAL_COMMUNITY): Payer: Self-pay | Admitting: Nurse Practitioner

## 2016-04-11 ENCOUNTER — Ambulatory Visit (HOSPITAL_COMMUNITY)
Admission: RE | Admit: 2016-04-11 | Discharge: 2016-04-11 | Disposition: A | Discharge status: Home / Self Care | Payer: Medicare Other | Source: Ambulatory Visit | Attending: Nurse Practitioner | Admitting: Nurse Practitioner

## 2016-04-11 VITALS — BP 150/80 | HR 66 | Ht 64.0 in | Wt 152.0 lb

## 2016-04-11 DIAGNOSIS — E109 Type 1 diabetes mellitus without complications: Secondary | ICD-10-CM | POA: Diagnosis not present

## 2016-04-11 DIAGNOSIS — Z853 Personal history of malignant neoplasm of breast: Secondary | ICD-10-CM | POA: Insufficient documentation

## 2016-04-11 DIAGNOSIS — E119 Type 2 diabetes mellitus without complications: Secondary | ICD-10-CM | POA: Diagnosis not present

## 2016-04-11 DIAGNOSIS — Z8679 Personal history of other diseases of the circulatory system: Secondary | ICD-10-CM | POA: Diagnosis not present

## 2016-04-11 DIAGNOSIS — E785 Hyperlipidemia, unspecified: Secondary | ICD-10-CM | POA: Diagnosis not present

## 2016-04-11 DIAGNOSIS — I4891 Unspecified atrial fibrillation: Secondary | ICD-10-CM

## 2016-04-11 DIAGNOSIS — F419 Anxiety disorder, unspecified: Secondary | ICD-10-CM | POA: Diagnosis not present

## 2016-04-11 DIAGNOSIS — Z9889 Other specified postprocedural states: Secondary | ICD-10-CM

## 2016-04-11 DIAGNOSIS — Z7901 Long term (current) use of anticoagulants: Secondary | ICD-10-CM | POA: Insufficient documentation

## 2016-04-11 DIAGNOSIS — N83209 Unspecified ovarian cyst, unspecified side: Secondary | ICD-10-CM | POA: Diagnosis not present

## 2016-04-11 DIAGNOSIS — I110 Hypertensive heart disease with heart failure: Secondary | ICD-10-CM | POA: Insufficient documentation

## 2016-04-11 DIAGNOSIS — I5032 Chronic diastolic (congestive) heart failure: Secondary | ICD-10-CM | POA: Diagnosis not present

## 2016-04-11 NOTE — Progress Notes (Signed)
Patient ID: Frank Pilger, female   DOB: January 01, 1946, 70 y.o.   MRN: [id number]     Primary Care Physician: Odette Fraction, MD Referring Physician: Dr. Leonides Grills Schwarz is a 70 y.o. female with a h/o persisitent afib s/p ablation 04/08/15. She has done very well failing the procedure and is staying in SR. She is off flecainide, CCB/BB. Continues on eliquis for a chadsvasc score of at least 5, no bleeding issues. She still has some c/o fatigue. She sees her endocrinologist on Friday.  Today, she denies symptoms of palpitations, chest pain, shortness of breath, orthopnea, PND, lower extremity edema, dizziness, presyncope, syncope, or neurologic sequela. The patient is tolerating medications without difficulties and is otherwise without complaint today.   Past Medical History  Diagnosis Date  . Ovarian cyst   . Hypertension   . Insulin pump in place   . Breast cancer (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 Anxiety     new dx  . Hyperlipidemia   . S/P radiation therapy 04/10/12- 05/26/2012    left Breast and Axilla / 50 gy / 25 Fractions with Left Breast Boost / 10 Gy / 5 Fractions  . Use of letrozole ([facility]) 06/13/12  . Diabetes mellitus type 1 (HCC)     Insulin pump  . Hypothyroidism   . Diastolic CHF ([place])     a. 12/2014 - due to AF RVR;  b. 03/2015 Echo: EF 60-65%, no rwma.  . Atrial fibrillation ([place])     a. 03/2015 s/p RFCA;  b. CHA2DS2VASc = 5-->Eliquis.  Marland Kitchen SIADH (syndrome of inappropriate ADH production) [facility])    Past Surgical History  Procedure Laterality Date  . Tonsillectomy    . Ovarian cyst surgery    . Colonoscopy      1 polyp   . 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 Appendectomy    . Tee without cardioversion  N/A 04/07/2015    Procedure: TRANSESOPHAGEAL ECHOCARDIOGRAM (TEE);  Surgeon: Thayer Headings, MD;  Location: [place];  Service: Cardiovascular;  Laterality: N/A;  . Electrophysiologic study N/A 04/08/2015    PVI + CTI ablation + posterior Box lesion + Atach ablation by [name]    Current Outpatient Prescriptions  Medication Sig Dispense Refill  . apixaban (ELIQUIS) 5 MG TABS tablet Take 1 tablet (5 mg total) by mouth 2 (two) times daily. 60 tablet 11  . Ascorbic Acid (VITAMIN C) 1000 MG tablet Take 1,000 mg by mouth daily.     . Cholecalciferol (VITAMIN D PO) Take 5,000 mcg by mouth daily.    Marland Kitchen Cod Liver Oil 1000 MG CAPS Take 2,000 mg by mouth daily.     . Coenzyme Q10 (COQ10) 200 MG CAPS Take 200 mg by mouth daily.    . Cyanocobalamin (VITAMIN B-12) 5000 MCG SUBL Place 5,000 mcg under the tongue daily.    Marland Kitchen glucosamine-chondroitin 500-400 MG tablet Take 2 tablets by mouth daily.     Marland Kitchen glucose blood (PRODIGY NO CODING BLOOD GLUC) test strip Use as instructed to check blood sugar 7 times per day dx E10.65 225 each 3  . HUMALOG 100 UNIT/ML injection BASAL RATE 0.9 7AM-12, 0.8 NOON-7 AM. MAX 60 UNITS SUBCUTANEOUSLY EVERY DAY WITH PUMP PER MD 20 mL 3  .  insulin lispro (HUMALOG) 100 UNIT/ML injection as directed. Reported on 12/23/2015    . Magnesium 400 MG CAPS Take 400 mg by mouth 2 (two) times daily.     . Menaquinone-7 100 MCG CAPS Take 150 mcg by mouth daily.    . Misc Natural Products (PROGESTERONE EX) Apply 1 ml to skin daily    . NALTREXONE HCL PO Take 4.5 mg by mouth daily.    Marland Kitchen RESVERATROL PO Take 250 mg by mouth daily.     Marland Kitchen thyroid (ARMOUR) 15 MG tablet Take 15 mg by mouth daily.    Marland Kitchen thyroid (ARMOUR) 30 MG tablet Take 30 mg by mouth daily before breakfast.     . Turmeric Curcumin 500 MG CAPS Take 1 tablet by mouth 3 (three) times daily.     No current facility-administered medications for this encounter.   Facility-Administered Medications Ordered in Other Encounters    Medication Dose Route Frequency Provider Last Rate Last Dose  . topical emolient (BIAFINE) emulsion   Topical Daily Eppie Gibson, MD        Allergies  Allergen Reactions  . Statins Other (See Comments)    Leg pain    Social History   Social History  . Marital Status: Married    Spouse Name: N/A  . Number of Children: N/A  . Years of Education: N/A   Occupational History  . Not on file.   Social History Main Topics  . Smoking status: Never Smoker   . Smokeless tobacco: Not on file  . Alcohol Use: No  . Drug Use: No  . Sexual Activity: Yes   Other Topics Concern  . Not on file   Social History Narrative    Family History  Problem Relation Age of Onset  . Breast cancer Mother 30  . Prostate cancer Maternal Uncle     died in his 36s  . Heart failure Mother   . Heart attack Father   . Diabetes Maternal Uncle 15    ROS- All systems are reviewed and negative except as per the HPI above  Physical Exam: Filed Vitals:   04/11/16 1507  BP: 150/80  Pulse: 66  Height: _0  (1.626 m)  Weight: 152 lb (68.947 kg)    GEN- The patient is well appearing, alert and oriented x 3 today.   Head- normocephalic, atraumatic Eyes-  Sclera clear, conjunctiva pink Ears- hearing intact Oropharynx- clear Neck- supple, no JVP Lymph- no cervical lymphadenopathy Lungs- Clear to ausculation bilaterally, normal work of breathing Heart- Regular rate and rhythm, no murmurs, rubs or gallops, PMI not laterally displaced GI- soft, NT, ND, + BS Extremities- no clubbing, cyanosis, or edema MS- no significant deformity or atrophy Skin- no rash or lesion Psych- euthymic mood, full affect Neuro- strength and sensation are intact  EKG- NSR at 66 bpm, pr int 144 ms, qrs 78 ms, qtc 427 ms Epic records reviewed    Assessment and Plan: 1. Afib Successful ablation and maintaining SR Off rhythm/rate control Continue apixaban  2. DM Per endocrinologist  Geroge Baseman. [facility] [address] [place], [place] [address] [phone]    F/u with Dr. Rayann Heman in September

## 2016-04-13 ENCOUNTER — Ambulatory Visit: Payer: Medicare Other | Admitting: Endocrinology

## 2016-04-13 ENCOUNTER — Ambulatory Visit (INDEPENDENT_AMBULATORY_CARE_PROVIDER_SITE_OTHER): Payer: Medicare Other | Admitting: Endocrinology

## 2016-04-13 ENCOUNTER — Encounter: Payer: Self-pay | Admitting: Endocrinology

## 2016-04-13 VITALS — BP 116/64 | HR 68 | Ht 64.0 in | Wt 149.0 lb

## 2016-04-13 DIAGNOSIS — E063 Autoimmune thyroiditis: Secondary | ICD-10-CM

## 2016-04-13 DIAGNOSIS — E038 Other specified hypothyroidism: Secondary | ICD-10-CM

## 2016-04-13 DIAGNOSIS — E1065 Type 1 diabetes mellitus with hyperglycemia: Secondary | ICD-10-CM

## 2016-04-13 DIAGNOSIS — E7800 Pure hypercholesterolemia, unspecified: Secondary | ICD-10-CM | POA: Diagnosis not present

## 2016-04-13 MED ORDER — ROSUVASTATIN CALCIUM 5 MG PO TABS: 5.0000 mg | ORAL_TABLET | Freq: Every day | ORAL | Status: DC

## 2016-04-13 NOTE — Patient Instructions (Addendum)
Start Crestor  Bolus at meals  30-40% more for hi fat foods

## 2016-04-13 NOTE — Progress Notes (Signed)
Patient ID: Gina [name], female   DOB: 1946/01/12, 70 y.o.   MRN: [id number]   Reason for Appointment: Insulin Pump followup:   History of Present Illness   Diagnosis: Type 1 DIABETES MELITUS, date of diagnosis:  1984     CURRENT insulin pump:  One Touch Ping, has been on a pump since 1995  HISTORY:   The pump SETTINGS are: Midnight = 0.45, 4 AM 0.95.  6 AM = 1.05, 10 AM = 1.0;, 6 PM = 0.8 and 9 PM = 0.75.   Total insulin dose about  30 units per day with 66 % in basal Carbohydrate ratio 1:10 before 12 noon, 1:7 for lunch and 1: After 5 PM Blood sugar target 110 .  Correction factor 1: 30   A1c is now fairly stable 6.7, previously 6.9  Current management, blood sugar patterns and problems identified:   Fasting blood sugars are relatively good and improved since her last visit with the basal rate change  Blood sugars are also usually fairly good before lunch although sporadically may have a high reading  She does some readings in the afternoons which are variable including higher postprandially at times  Most of her readings before supper are fairly good including 1 episode of low sugar down to 50  She is generally having good postprandial readings after supper but last night with eating high-fat meal her blood sugar went up by about 200 mg.  She has had at least 3 episodes where she forgot her bolus at suppertime and her reading at night was significantly high  She has done a little walking recently, she thinks that with brisk walking she may occasionally feels the blood sugar getting low and will then use a glucose tablet to treat the low sugar.  Does not use temporary basal when exercising   She usually is still eating small amount of carbohydrates, average carbohydrates per day as entered only 23 g  GLUCOSE CONTROL with the pump is assessed today by pump download  Mean values apply above for all meters except median for One Touch  PRE-MEAL Fasting Lunch  Dinner Bedtime Overall  Glucose range:  78- 166  72- 215  50- 151  150- 307   Mean/median: 122  150  87   208 171    EXERCISE:  Recently trying to walk, some gardening activities   Wt Readings from Last 3 Encounters:  04/13/16 149 lb (67.586 kg)  04/11/16 152 lb (68.947 kg)  01/02/16 145 lb 6.4 oz (65.953 kg)    LABS:   Lab Results  Component Value Date   HGBA1C 6.7* 03/26/2016   HGBA1C 6.9* 12/20/2015   HGBA1C 6.7* 07/28/2015   Lab Results  Component Value Date   MICROALBUR <0.7 03/26/2016   [place] 211* 03/26/2016   CREATININE 0.73 03/26/2016   No visits with results within 1 Week(s) from this visit. Latest known visit with results is:  Lab on 03/26/2016  Component Date Value Ref Range Status  . Hgb A1c MFr Bld 03/26/2016 6.7* 4.6 - 6.5 % Final   Glycemic Control Guidelines for People with Diabetes:Non Diabetic:  <6%Goal of Therapy: <7%Additional Action Suggested:  >8%   . Sodium 03/26/2016 136  135 - 145 mEq/L Final  . Potassium 03/26/2016 4.3  3.5 - 5.1 mEq/L Final  . Chloride 03/26/2016 99  96 - 112 mEq/L Final  . CO2 03/26/2016 30  19 - 32 mEq/L Final  . Glucose, Bld 03/26/2016 134* 70 - 99  mg/dL Final  . BUN 03/26/2016 16  6 - 23 mg/dL Final  . Creatinine, Ser 03/26/2016 0.73  0.40 - 1.20 mg/dL Final  . Total Bilirubin 03/26/2016 0.5  0.2 - 1.2 mg/dL Final  . Alkaline Phosphatase 03/26/2016 70  39 - 117 U/L Final  . AST 03/26/2016 22  0 - 37 U/L Final  . ALT 03/26/2016 20  0 - 35 U/L Final  . Total Protein 03/26/2016 6.7  6.0 - 8.3 g/dL Final  . Albumin 03/26/2016 4.0  3.5 - 5.2 g/dL Final  . Calcium 03/26/2016 9.1  8.4 - 10.5 mg/dL Final  . GFR 03/26/2016 83.35  >60.00 mL/min Final  . Microalb, Ur 03/26/2016 <0.7  0.0 - 1.9 mg/dL Final  . Creatinine,U 03/26/2016 98.1   Final  . Microalb Creat Ratio 03/26/2016 0.7  0.0 - 30.0 mg/g Final  . Cholesterol 03/26/2016 292* 0 - 200 mg/dL Final   ATP III Classification       Desirable:  < 200 mg/dL                Borderline High:  200 - 239 mg/dL          High:  > = 240 mg/dL  . Triglycerides 03/26/2016 50.0  0.0 - 149.0 mg/dL Final   Normal:  <150 mg/dLBorderline High:  150 - 199 mg/dL  . HDL 03/26/2016 70.60  >39.00 mg/dL Final  . VLDL 03/26/2016 10.0  0.0 - 40.0 mg/dL Final  . LDL Cholesterol 03/26/2016 211* 0 - 99 mg/dL Final  . Total CHOL/HDL Ratio 03/26/2016 4   Final                  Men          Women1/2 Average Risk     3.4          3.3Average Risk          5.0          4.42X Average Risk          9.6          7.13X Average Risk          15.0          11.0                      . NonHDL 03/26/2016 221.47   Final   NOTE:  Non-HDL goal should be 30 mg/dL higher than patient's LDL goal (i.e. LDL goal of < 70 mg/dL, would have non-HDL goal of < 100 mg/dL)       Medication List       This list is accurate as of: 04/13/16 12:05 PM.  Always use your most recent med list.               apixaban 5 MG Tabs tablet  Commonly known as:  ELIQUIS  Take 1 tablet (5 mg total) by mouth 2 (two) times daily.     Cod Liver Oil 1000 MG Caps  Take 2,000 mg by mouth daily.     CoQ10 200 MG Caps  Take 200 mg by mouth daily.     glucosamine-chondroitin 500-400 MG tablet  Take 2 tablets by mouth daily.     glucose blood test strip  Commonly known as:  PRODIGY NO CODING BLOOD GLUC  Use as instructed to check blood sugar 7 times per day dx E10.65     insulin lispro 100 UNIT/ML injection  Commonly known as:  HUMALOG  as directed. Reported on 12/23/2015     HUMALOG 100 UNIT/ML injection  Generic drug:  insulin lispro  BASAL RATE 0.9 7AM-12, 0.8 NOON-7 AM. MAX 60 UNITS SUBCUTANEOUSLY EVERY DAY WITH PUMP PER MD     Magnesium 400 MG Caps  Take 400 mg by mouth 2 (two) times daily.     Menaquinone-7 100 MCG Caps  Take 150 mcg by mouth daily.     NALTREXONE HCL PO  Take 4.5 mg by mouth daily.     PROGESTERONE EX  Apply 1 ml to skin daily     RESVERATROL PO  Take 250 mg by mouth daily.      rosuvastatin 5 MG tablet  Commonly known as:  CRESTOR  Take 1 tablet (5 mg total) by mouth daily.     thyroid 15 MG tablet  Commonly known as:  ARMOUR  Take 15 mg by mouth daily.     thyroid 30 MG tablet  Commonly known as:  ARMOUR  Take 30 mg by mouth daily before breakfast.     Turmeric Curcumin 500 MG Caps  Take 1 tablet by mouth 3 (three) times daily.     Vitamin B-12 5000 MCG Subl  Place 5,000 mcg under the tongue daily.     vitamin C 1000 MG tablet  Take 1,000 mg by mouth daily.     VITAMIN D PO  Take 5,000 mcg by mouth daily.        Allergies:  Allergies  Allergen Reactions  . Statins Other (See Comments)    Leg pain    Past Medical History  Diagnosis Date  . Ovarian cyst   . Hypertension   . Insulin pump in place   . 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 Anxiety     new dx  . Hyperlipidemia   . S/P radiation therapy 04/10/12- 05/26/2012    left Breast and Axilla / 50 gy / 25 Fractions with Left Breast Boost / 10 Gy / 5 Fractions  . Use of letrozole ([facility]) 06/13/12  . Diabetes mellitus type 1 (HCC)     Insulin pump  . Hypothyroidism   . Diastolic CHF ([place])     a. 12/2014 - due to AF RVR;  b. 03/2015 Echo: EF 60-65%, no rwma.  . Atrial fibrillation ([place])     a. 03/2015 s/p RFCA;  b. CHA2DS2VASc = 5-->Eliquis.  Marland Kitchen SIADH (syndrome of inappropriate ADH production) [facility])     Past Surgical History  Procedure Laterality Date  . Tonsillectomy    . Ovarian cyst surgery    . Colonoscopy      1 polyp   . 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 Appendectomy    . Tee without cardioversion N/A 04/07/2015    Procedure: TRANSESOPHAGEAL ECHOCARDIOGRAM (TEE);  Surgeon: Thayer Headings, MD;  Location: [place];  Service: Cardiovascular;  Laterality: N/A;  . Electrophysiologic study N/A 04/08/2015    PVI + CTI ablation + posterior Box lesion + Atach ablation by [name]    Family History  Problem Relation Age of Onset  . Breast cancer Mother 53  . Prostate cancer Maternal Uncle     died in his 34s  . Heart  failure Mother   . Heart attack Father   . Diabetes Maternal Uncle 15    Social History:  reports that she has never smoked. She does not have any smokeless tobacco history on file. She reports that she does not drink alcohol or use illicit drugs.  REVIEW of systems:   She has a history of mild hypothyroidism followed by a holistic physician Currently she is getting a regimen of 3m  However she is complaining of significant fatigue since March   Lab Results  Component Value Date   TSH 1.32 12/20/2015    HYPERCHOLESTEROLEMIA: Her LDL has been persistently high and refuses to take a statin drug including pravastatin even though it had no side effects previously.  She was previously trying to use red rice yeast , none now She was told by her wellness  physician that she does not need any  treatment because her particle size is normal   She was given a prescription for Crestor on the last 2 visits  she did not start this  She refuses to consider  starting medication despite leading to the patient that she has an LDL about twice the required number and this will not improve with diet Her LDL is  higher now  Previously her LDL particle number is consistently high at 1621    Lab Results  Component Value Date   CHOL 292* 03/26/2016   HDL 70.60 03/26/2016   LDLCALC 211* 03/26/2016   LDLDIRECT 148.4 08/06/2013   TRIG 50.0 03/26/2016   CHOLHDL 4 03/26/2016   .  EXAM:  BP 116/64 mmHg  Pulse 68  Ht _0  (1.626 m)  Wt 149 lb (67.586 kg)  BMI 25.56 kg/m2  SpO2 97%   ASSESSMENT:  DIABETES: Her blood sugars are overall fairly good with A1c 6.9, considering her  age and duration of diabetes See history of present illness for detailed discussion of his current management, blood sugar patterns and problems identified  Her blood sugars are reasonably well-controlled with some variability The most of her variability is related to the type of meals she is having and either inadequate coverage for certain types of meals or forgetting to bolus especially at suppertime She does not adjust her boluses for higher fat meals which appear to be missing her postprandial readings This is despite her carbohydrate coverage being adjusted on the last visit at lunchtime  She does have some fluctuation in her blood sugars with standard deviation 61 HIGHEST blood sugars areafter supper at night Fasting blood sugars are fairly well controlled especially with changing her settings on the last visit Minimal  hypoglycemia now  RECOMMENDATIONS: As in patient instructions  HYPERTENSION:  well controlled   HYPERLIPIDEMIA: Discussed in detail again the need for lipid management  with her LDL being over 200 now and discussed that she has significant atherogenic potential with her lipids especially with her diabetes  Prescription for Crestor 5 mghas been sent to the pharmacy She can try taking this every other day to start with   Will need to follow-up her lipids on her next visit  FATIGUE: She will discussed with PCP  Counseling time on subjects discussed above is over 50% of today's 25 minute visit  Javeah Loeza 04/13/2016, 12:05 PM

## 2016-06-07 ENCOUNTER — Telehealth (HOSPITAL_COMMUNITY): Payer: Self-pay | Admitting: *Deleted

## 2016-06-07 NOTE — Telephone Encounter (Signed)
I cld pt to check on her and see if her bp has come up and if she is feeling better.  Pt cld earlier in the day to speak with [name] regarding low bp and not feeling well. Pt was told to drink plenty of water and was not taking anything that would lower her bp.  There was no evidence of rapid a-fib and per patient there had been no recent changes to medications or general health.  Per patient she is feeling much better and her bp is finally up to normal.  She was advised to make sure she continues drinking plenty of fluids.  Pt understood

## 2016-06-12 ENCOUNTER — Telehealth: Payer: Self-pay | Admitting: Family Medicine

## 2016-06-12 NOTE — Telephone Encounter (Signed)
She can take pravastatin 40 mg every other day, had taken this before without side effects

## 2016-06-12 NOTE — Telephone Encounter (Signed)
See message and please advise.  

## 2016-06-12 NOTE — Telephone Encounter (Signed)
Patient wanted to let the doctor know that she has stopped taking the medication ( Rosuvastatin ) because it was making her face swell.  She was waking up with an headache every morning and she experienced extreme fatigue.  She was taking it three times a week.

## 2016-06-12 NOTE — Telephone Encounter (Signed)
Attempted to reach the patient. Patient was unavailable will attempt at a later time.

## 2016-06-13 NOTE — Telephone Encounter (Signed)
I contacted the patient and advised of MD's instructions. Patient stated she would think about starting this medication and let us know if she would like to proceed with taking this medication. Patient stated she wanted to try natural means before starting another medication.

## 2016-06-21 ENCOUNTER — Telehealth: Payer: Self-pay | Admitting: Internal Medicine

## 2016-06-21 NOTE — Telephone Encounter (Signed)
New message  Pt call to schedule appt for ov and labs. No order for labs. Could an order be attached to appt. Pt is aware of both appts. Thank you

## 2016-07-10 ENCOUNTER — Other Ambulatory Visit (INDEPENDENT_AMBULATORY_CARE_PROVIDER_SITE_OTHER): Payer: Medicare Other

## 2016-07-10 DIAGNOSIS — E1065 Type 1 diabetes mellitus with hyperglycemia: Secondary | ICD-10-CM | POA: Diagnosis not present

## 2016-07-10 LAB — COMPREHENSIVE METABOLIC PANEL
ALBUMIN: 3.6 g/dL (ref 3.5–5.2)
ALT: 18 U/L (ref 0–35)
AST: 22 U/L (ref 0–37)
Alkaline Phosphatase: 71 U/L (ref 39–117)
BUN: 11 mg/dL (ref 6–23)
CALCIUM: 8.7 mg/dL (ref 8.4–10.5)
CHLORIDE: 101 meq/L (ref 96–112)
CO2: 32 meq/L (ref 19–32)
Creatinine, Ser: 0.78 mg/dL (ref 0.40–1.20)
GFR: 77.15 mL/min (ref >60.00)
Glucose, Bld: 107 mg/dL — ABNORMAL HIGH (ref 70–99)
POTASSIUM: 4.4 meq/L (ref 3.5–5.1)
SODIUM: 137 meq/L (ref 135–145)
TOTAL PROTEIN: 6.3 g/dL (ref 6.0–8.3)
Total Bilirubin: 0.4 mg/dL (ref 0.2–1.2)

## 2016-07-10 LAB — LIPID PANEL
CHOL/HDL RATIO: 4
CHOLESTEROL: 248 mg/dL — AB (ref 0–200)
HDL: 64.6 mg/dL (ref >39.00)
LDL CALC: 173 mg/dL — AB (ref 0–99)
NonHDL: 183.82
Triglycerides: 55 mg/dL (ref 0.0–149.0)
VLDL: 11 mg/dL (ref 0.0–40.0)

## 2016-07-10 LAB — HEMOGLOBIN A1C: Hgb A1c MFr Bld: 6.8 % — ABNORMAL HIGH (ref 4.6–6.5)

## 2016-07-10 LAB — TSH: TSH: 1.07 u[IU]/mL (ref 0.35–4.50)

## 2016-07-13 ENCOUNTER — Ambulatory Visit: Payer: Medicare Other | Admitting: Endocrinology

## 2016-07-16 ENCOUNTER — Other Ambulatory Visit: Payer: Self-pay

## 2016-07-16 ENCOUNTER — Ambulatory Visit (INDEPENDENT_AMBULATORY_CARE_PROVIDER_SITE_OTHER): Payer: Medicare Other | Admitting: Endocrinology

## 2016-07-16 ENCOUNTER — Encounter: Payer: Self-pay | Admitting: Endocrinology

## 2016-07-16 VITALS — BP 124/62 | HR 74 | Ht 64.0 in | Wt 151.0 lb

## 2016-07-16 DIAGNOSIS — E063 Autoimmune thyroiditis: Secondary | ICD-10-CM

## 2016-07-16 DIAGNOSIS — I10 Essential (primary) hypertension: Secondary | ICD-10-CM | POA: Diagnosis not present

## 2016-07-16 DIAGNOSIS — E7800 Pure hypercholesterolemia, unspecified: Secondary | ICD-10-CM | POA: Diagnosis not present

## 2016-07-16 DIAGNOSIS — E1065 Type 1 diabetes mellitus with hyperglycemia: Secondary | ICD-10-CM | POA: Diagnosis not present

## 2016-07-16 DIAGNOSIS — E038 Other specified hypothyroidism: Secondary | ICD-10-CM | POA: Diagnosis not present

## 2016-07-16 MED ORDER — GLUCOSE BLOOD VI STRP: ORAL_STRIP | 12 refills | Status: DC

## 2016-07-16 NOTE — Progress Notes (Signed)
Patient ID: Gina [name], female   DOB: [birth year], 70 y.o.   MRN: [id number]   Reason for Appointment: Insulin Pump followup:   History of Present Illness   Diagnosis: Type 1 DIABETES MELITUS, date of diagnosis:  1984     CURRENT insulin pump:  One Touch Ping, has been on a pump since 1995  HISTORY:   The pump SETTINGS are: Midnight = 0.45, 4 AM 0.95.  6 AM = 1.05, 10 AM = 1.0;, 6 PM = 0.8 and 9 PM = 0.75.   Total insulin dose about  30 units per day with 66 % in basal Carbohydrate ratio 1:10 before 12 noon, 1:7 for lunch and 1: 8 After 5 PM Blood sugar target 110 .  Correction factor 1: 30   A1c is now fairly stable 6.8, previously 6.7  Current management, blood sugar patterns and problems identified:   She started using the prodigy monitor and not clear why she was not able to get her One Touch test strips covered  She is only entering her blood sugars occasionally and usually not in the morning  Although her blood sugars are generally fairly good on an average to do fluctuate more in the afternoons and evenings  She will sporadically get low blood sugars especially with more activity, she is usually active outside  Has to documented low blood sugars at 1 PM and 5 PM last week   Does not use temporary basal when exercising as discussed  Blood sugars but mostly high yesterday and she does not know why,  She is again usually not consistently entering carbohydrates when she is eating and will go ahead and bolus without doing that   She usually is still eating small amount of carbohydrates, average carbohydrates per day as entered only 30 g  She does have occasional her post prandial readings, probably not judging her carbohydrate and fat intake accurately on those days  GLUCOSE CONTROL with the pump is assessed today by pump download  Mean values apply above for all meters except median for One Touch  PRE-MEAL Fasting Lunch Dinner Bedtime Overall  Glucose  range: 108  58-197- 55-252  1 60-340    Mean/median:     171     EXERCISE:  Usually with yard work and farming activities   Wt Readings from Last 3 Encounters:  07/16/16 151 lb (68.5 kg)  04/13/16 149 lb (67.6 kg)  04/11/16 152 lb (68.9 kg)    LABS:   Lab Results  Component Value Date   HGBA1C 6.8 (H) 07/10/2016   HGBA1C 6.7 (H) 03/26/2016   HGBA1C 6.9 (H) 12/20/2015   Lab Results  Component Value Date   MICROALBUR <0.7 03/26/2016   LDLCALC 173 (H) 07/10/2016   CREATININE 0.78 07/10/2016   Lab on 07/10/2016  Component Date Value Ref Range Status  . Hgb A1c MFr Bld 07/10/2016 6.8* 4.6 - 6.5 % Final  . Sodium 07/10/2016 137  135 - 145 mEq/L Final  . Potassium 07/10/2016 4.4  3.5 - 5.1 mEq/L Final  . Chloride 07/10/2016 101  96 - 112 mEq/L Final  . CO2 07/10/2016 32  19 - 32 mEq/L Final  . Glucose, Bld 07/10/2016 107* 70 - 99 mg/dL Final  . BUN 07/10/2016 11  6 - 23 mg/dL Final  . Creatinine, Ser 07/10/2016 0.78  0.40 - 1.20 mg/dL Final  . Total Bilirubin 07/10/2016 0.4  0.2 - 1.2 mg/dL Final  . Alkaline Phosphatase 07/10/2016 71  39 -  117 U/L Final  . AST 07/10/2016 22  0 - 37 U/L Final  . ALT 07/10/2016 18  0 - 35 U/L Final  . Total Protein 07/10/2016 6.3  6.0 - 8.3 g/dL Final  . Albumin 07/10/2016 3.6  3.5 - 5.2 g/dL Final  . Calcium 07/10/2016 8.7  8.4 - 10.5 mg/dL Final  . GFR 07/10/2016 77.15  >60.00 mL/min Final  . Cholesterol 07/10/2016 248* 0 - 200 mg/dL Final  . Triglycerides 07/10/2016 55.0  0.0 - 149.0 mg/dL Final  . HDL 07/10/2016 64.60  >39.00 mg/dL Final  . VLDL 07/10/2016 11.0  0.0 - 40.0 mg/dL Final  . LDL Cholesterol 07/10/2016 173* 0 - 99 mg/dL Final  . Total CHOL/HDL Ratio 07/10/2016 4   Final  . NonHDL 07/10/2016 183.82   Final  . TSH 07/10/2016 1.07  0.35 - 4.50 uIU/mL Final       Medication List       Accurate as of 07/16/16  3:26 PM. Always use your most recent med list.          apixaban 5 MG Tabs tablet Commonly known as:   ELIQUIS Take 1 tablet (5 mg total) by mouth 2 (two) times daily.   Cod Liver Oil 1000 MG Caps Take 2,000 mg by mouth daily.   CoQ10 200 MG Caps Take 200 mg by mouth daily.   glucosamine-chondroitin 500-400 MG tablet Take 2 tablets by mouth daily.   glucose blood test strip Commonly known as:  PRODIGY NO CODING BLOOD GLUC Use as instructed to check blood sugar 7 times per day dx E10.65   insulin lispro 100 UNIT/ML injection Commonly known as:  HUMALOG as directed. Reported on 12/23/2015   HUMALOG 100 UNIT/ML injection Generic drug:  insulin lispro BASAL RATE 0.9 7AM-12, 0.8 NOON-7 AM. MAX 60 UNITS SUBCUTANEOUSLY EVERY DAY WITH PUMP PER MD   Magnesium 400 MG Caps Take 400 mg by mouth 2 (two) times daily.   Menaquinone-7 100 MCG Caps Take 150 mcg by mouth daily.   NALTREXONE HCL PO Take 4.5 mg by mouth daily.   PROGESTERONE EX Apply 1 ml to skin daily   RESVERATROL PO Take 250 mg by mouth daily.   rosuvastatin 5 MG tablet Commonly known as:  CRESTOR Take 1 tablet (5 mg total) by mouth daily.   thyroid 15 MG tablet Commonly known as:  ARMOUR Take 15 mg by mouth daily.   thyroid 30 MG tablet Commonly known as:  ARMOUR Take 30 mg by mouth daily before breakfast.   Turmeric Curcumin 500 MG Caps Take 1 tablet by mouth 3 (three) times daily.   Vitamin B-12 5000 MCG Subl Place 5,000 mcg under the tongue daily.   vitamin C 1000 MG tablet Take 1,000 mg by mouth daily.   VITAMIN D PO Take 5,000 mcg by mouth daily.       Allergies:  Allergies  Allergen Reactions  . Statins Other (See Comments)    Leg pain    Past Medical History:  Diagnosis Date  . Anxiety    new dx  . Atrial fibrillation ([place])    a. 03/2015 s/p RFCA;  b. CHA2DS2VASc = 5-->Eliquis.  . 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 Diabetes mellitus type 1 (HCC)    Insulin pump   . Diastolic CHF ([place])    a. 10/6107 - due to AF RVR;  b. 03/2015 Echo:  EF 60-65%, no rwma.  . Hyperlipidemia   . Hypertension   . Hypothyroidism   . Insulin pump in place   . Ovarian cyst   . S/P radiation therapy 04/10/12- 05/26/2012   left Breast and Axilla / 50 gy / 25 Fractions with Left Breast Boost / 10 Gy / 5 Fractions  . SIADH (syndrome of inappropriate ADH production) ([place])   . Use of letrozole ([place]) 06/13/12    Past Surgical History:  Procedure Laterality Date  . APPENDECTOMY    . 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 COLONOSCOPY     1 polyp   . ELECTROPHYSIOLOGIC STUDY N/A 04/08/2015   PVI + CTI ablation + posterior Box lesion + Atach ablation by [name]  . OVARIAN CYST SURGERY    . TEE WITHOUT CARDIOVERSION N/A 04/07/2015   Procedure: TRANSESOPHAGEAL ECHOCARDIOGRAM (TEE);  Surgeon: Thayer Headings, MD;  Location: [facility] ENDOSCOPY;  Service: Cardiovascular;  Laterality: N/A;  . TONSILLECTOMY      Family History  Problem Relation Age of Onset  . Breast cancer Mother 34  . Prostate cancer Maternal Uncle     died in his 72s  . Heart failure Mother   . Heart attack Father   . Diabetes Maternal Uncle 15    Social History:  reports that she has never smoked. She does not have any smokeless tobacco history on file. She reports that she does not drink alcohol or use drugs.  REVIEW of systems:   She has a history of mild hypothyroidism followed by a holistic physician Currently she is getting a regimen of 35m  However she is complaining of significant fatigue since March   Lab Results  Component Value Date   TSH 1.07 07/10/2016    HYPERCHOLESTEROLEMIA: Her LDL has been persistently high and refuses to take a statin drug including pravastatin even though it had no side effects previously.  She was previously trying to use  red rice yeast , none now Previously her LDL particle number is consistently high at 1621 More recently she has been better convinced about needing treatment for her hypercholesterolemia  She was given a prescription for Crestor which she thinks she tried for 3-4 weeks but could not continue because she felt it was giving her headaches and making her tired  She again thinks she can try natural remedies and essential oils for her hypercholesterolemia  Her LDL is relatively better although she has been off her Crestor for about a month    Lab Results  Component Value Date   CHOL 248 (H) 07/10/2016   HDL 64.60 07/10/2016   LDLCALC 173 (H) 07/10/2016   LDLDIRECT 148.4 08/06/2013   TRIG 55.0 07/10/2016   CHOLHDL 4 07/10/2016   .  EXAM:  BP 124/62   Pulse 74   Ht _0  (1.626 m)   Wt 151 lb (68.5 kg)   BMI 25.92 kg/m    ASSESSMENT:  DIABETES:  Her blood sugars are overall fairly good with A1c Again below 7, considering her age and duration of diabetes See history of present illness for detailed discussion of his current management, blood sugar patterns and problems identified  Her blood sugars are difficult to assess as she has not entered her readings in her pump most of the time especially fasting She is also having variable activity and food intake  Probably not able to judge her boluses accurately as she still has occasional postprandial hyperglycemia at night and occasionally in the afternoon Hypoglycemia is infrequent and she does not again take steps to prevent it such as using a temporary basal when she is more active as discussed above couple of times before  RECOMMENDATIONS:  Start using One Touch meter which will link to her pump and given prescription for her [place]  More consistent monitoring fasting  Try to enter carbohydrates better in the pump  Probably needs adding 30% or more to her boluses for higher fat meals and extended boluses  Temporary  basal or increased activity  BASAL rate changes: 6 AM-8 AM = 1.05 8 AM-11:30 AM = 1.15.  Copy of settings given to patient  HYPERTENSION:  well controlled   HYPERLIPIDEMIA: Discussed in detail again the need for lipid management  Since she cannot tolerate most statin drugs for various reasons she will start Repatha Given her information on this and will get her her authorized. For convenience she can try the q monthly formulation and discussed how this works, benefits possible side effects and current clinical trial results   Will need to follow-up her lipids on her next visit  HYPOTHYROIDISM: Adequately replaced  Counseling time on subjects discussed above is over 50% of today's 25 minute visit  Nisha Dhami 07/16/2016, 3:26 PM

## 2016-07-16 NOTE — Patient Instructions (Signed)
Check blood sugars on waking up  daily  Also check blood sugars about 2 hours after a meal and do this after different meals by rotation  Recommended blood sugar levels on waking up is 90-130 and about 2 hours after meal is 130-160  Please bring your blood sugar monitor to each visit, thank you  

## 2016-07-23 ENCOUNTER — Encounter: Payer: Self-pay | Admitting: Internal Medicine

## 2016-08-06 ENCOUNTER — Other Ambulatory Visit: Payer: Medicare Other

## 2016-08-06 ENCOUNTER — Ambulatory Visit: Payer: Medicare Other | Admitting: Internal Medicine

## 2016-08-15 ENCOUNTER — Ambulatory Visit (INDEPENDENT_AMBULATORY_CARE_PROVIDER_SITE_OTHER): Payer: Medicare Other | Admitting: Internal Medicine

## 2016-08-15 ENCOUNTER — Encounter: Payer: Self-pay | Admitting: Internal Medicine

## 2016-08-15 ENCOUNTER — Other Ambulatory Visit: Payer: Medicare Other | Admitting: *Deleted

## 2016-08-15 VITALS — BP 154/77 | HR 64 | Ht 64.0 in | Wt 154.4 lb

## 2016-08-15 DIAGNOSIS — I483 Typical atrial flutter: Secondary | ICD-10-CM

## 2016-08-15 DIAGNOSIS — E1021 Type 1 diabetes mellitus with diabetic nephropathy: Secondary | ICD-10-CM

## 2016-08-15 DIAGNOSIS — I10 Essential (primary) hypertension: Secondary | ICD-10-CM

## 2016-08-15 DIAGNOSIS — I480 Paroxysmal atrial fibrillation: Secondary | ICD-10-CM

## 2016-08-15 DIAGNOSIS — G903 Multi-system degeneration of the autonomic nervous system: Secondary | ICD-10-CM

## 2016-08-15 LAB — COMPREHENSIVE METABOLIC PANEL
ALT: 17 U/L (ref 6–29)
AST: 23 U/L (ref 10–35)
Albumin: 3.9 g/dL (ref 3.6–5.1)
Alkaline Phosphatase: 73 U/L (ref 33–130)
BUN: 10 mg/dL (ref 7–25)
CHLORIDE: 100 mmol/L (ref 98–110)
CO2: 27 mmol/L (ref 20–31)
CREATININE: 0.86 mg/dL (ref 0.60–0.93)
Calcium: 9.1 mg/dL (ref 8.6–10.4)
GLUCOSE: 77 mg/dL (ref 65–99)
Potassium: 4.4 mmol/L (ref 3.5–5.3)
SODIUM: 136 mmol/L (ref 135–146)
TOTAL PROTEIN: 6.3 g/dL (ref 6.1–8.1)
Total Bilirubin: 0.5 mg/dL (ref 0.2–1.2)

## 2016-08-15 LAB — LIPID PANEL
CHOL/HDL RATIO: 4 ratio (ref <=5.0)
Cholesterol: 278 mg/dL — ABNORMAL HIGH (ref 125–200)
HDL: 69 mg/dL (ref >=46)
LDL CALC: 194 mg/dL — AB (ref <130)
Triglycerides: 76 mg/dL (ref <150)
VLDL: 15 mg/dL (ref <30)

## 2016-08-15 NOTE — Addendum Note (Signed)
Addended by: Eulis Foster on: 08/15/2016 10:55 AM   Modules accepted: Orders

## 2016-08-15 NOTE — Progress Notes (Signed)
PCP: Odette Fraction, MD Primary Cardiologist:  Dr Arlan Organ Branagan is a 70 y.o. female who presents today for routine electrophysiology followup.   She continues to do well.  She remains active.   She has had no further arrhythmias since her ablation off AAD therapy.  Today, she denies symptoms of palpitations, chest pain, shortness of breath,  lower extremity edema, dizziness, presyncope, or syncope.  The patient is otherwise without complaint today.   Past Medical History:  Diagnosis Date  . Anxiety    new dx  . Atrial fibrillation ([place])    a. 03/2015 s/p RFCA;  b. CHA2DS2VASc = 5-->Eliquis.  . 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 Diabetes mellitus type 1 (HCC)    Insulin pump  . Diastolic CHF ([place])    a. 05/1858 - due to AF RVR;  b. 03/2015 Echo: EF 60-65%, no rwma.  . Hyperlipidemia   . Hypertension   . Hypothyroidism   . Insulin pump in place   . Ovarian cyst   . S/P radiation therapy 04/10/12- 05/26/2012   left Breast and Axilla / 50 gy / 25 Fractions with Left Breast Boost / 10 Gy / 5 Fractions  . SIADH (syndrome of inappropriate ADH production) ([place])   . Use of letrozole ([place]) 06/13/12   Past Surgical History:  Procedure Laterality Date  . APPENDECTOMY    . 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 COLONOSCOPY     1 polyp   . ELECTROPHYSIOLOGIC STUDY N/A 04/08/2015   PVI + CTI ablation + posterior Box lesion + Atach ablation by [name]  . OVARIAN CYST SURGERY    . TEE WITHOUT CARDIOVERSION N/A 04/07/2015   Procedure: TRANSESOPHAGEAL ECHOCARDIOGRAM (TEE);  Surgeon: Thayer Headings, MD;  Location: [facility] ENDOSCOPY;  Service: Cardiovascular;  Laterality: N/A;  . TONSILLECTOMY      ROS- all systems are  reviewed and negatives except as per HPI above  Current Outpatient Prescriptions  Medication Sig Dispense Refill  . apixaban (ELIQUIS) 5 MG TABS tablet Take 1 tablet (5 mg total) by mouth 2 (two) times daily. 60 tablet 11  . Ascorbic Acid (VITAMIN C) 1000 MG tablet Take 1,000 mg by mouth daily.     . Cholecalciferol (VITAMIN D PO) Take 5,000 mcg by mouth daily.    Marland Kitchen Cod Liver Oil 1000 MG CAPS Take 2,000 mg by mouth daily.     . Coenzyme Q10 (COQ10) 200 MG CAPS Take 200 mg by mouth daily.    . Cyanocobalamin (VITAMIN B-12) 5000 MCG SUBL Place 5,000 mcg under the tongue daily.    Marland Kitchen glucosamine-chondroitin 500-400 MG tablet Take 2 tablets by mouth daily.     Marland Kitchen glucose blood (ONE TOUCH ULTRA TEST) test strip Use asto check blood sugar 5 times a day 100 each 12  . HUMALOG 100 UNIT/ML injection BASAL RATE 0.9 7AM-12, 0.8 NOON-7 AM. MAX 60 UNITS SUBCUTANEOUSLY EVERY DAY WITH PUMP PER MD 20 mL 3  . Magnesium 400 MG CAPS Take 400 mg by mouth 2 (two) times daily.     . Menaquinone-7 100 MCG CAPS Take 150 mcg by mouth daily.    . Misc Natural Products (PROGESTERONE EX) Apply 1 ml to skin daily    . NALTREXONE HCL  PO Take 4.5 mg by mouth daily.    Marland Kitchen RESVERATROL PO Take 250 mg by mouth daily.     Marland Kitchen thyroid (ARMOUR) 15 MG tablet Take 15 mg by mouth daily.    Marland Kitchen thyroid (ARMOUR) 30 MG tablet Take 30 mg by mouth daily before breakfast.     . Turmeric Curcumin 500 MG CAPS Take 1 tablet by mouth 3 (three) times daily.     No current facility-administered medications for this visit.    Facility-Administered Medications Ordered in Other Visits  Medication Dose Route Frequency Provider Last Rate Last Dose  . topical emolient (BIAFINE) emulsion   Topical Daily Eppie Gibson, MD        Physical Exam: Vitals:   08/15/16 1130  BP: (!) 154/77  Pulse: 64  Weight: 154 lb 6.4 oz (70 kg)  Height: _0  (1.626 m)    GEN- The patient is well appearing, alert and oriented x 3 today.   Head- normocephalic,  atraumatic Eyes-  Sclera clear, conjunctiva pink Ears- hearing intact Oropharynx- clear Lungs- Clear to ausculation bilaterally, normal work of breathing Heart- Regular rate and rhythm, no murmurs, rubs or gallops, PMI not laterally displaced GI- soft, NT, ND, + BS Extremities- no clubbing, cyanosis, trace edema  ekg today reveal sinus rhythm  64 bpm, otherwise normal ekg  Assessment and Plan:  1. Afib/ atrial flutter/atach Extensive ablation required 6/16 No arrhythmias off of AAD therapy chads2vasc score is at least 5.  Continue long term anticoagulation  2. htn Elevated today.  She does not wish to make changes She will follow her BP at home. Consider losartan which would be beneficial from DM, HTN, and AF standpoints if BP remains elevated.  Regular exercise encouraged  Return to see [name] in the AF clinic in 6 months I will see in 12 months  Thompson Grayer MD, [facility] 08/15/2016 12:06 PM

## 2016-08-15 NOTE — Addendum Note (Signed)
Addended by: Eulis Foster on: 08/15/2016 10:59 AM   Modules accepted: Orders

## 2016-08-15 NOTE — Patient Instructions (Signed)
Medication Instructions:  Your physician recommends that you continue on your current medications as directed. Please refer to the Current Medication list given to you today.   Labwork: None ordered   Testing/Procedures: None ordered   Follow-Up: Your physician wants you to follow-up in: 6 months with Donna Carroll, NP and 12 months with Dr Allred You will receive a reminder letter in the mail two months in advance. If you don't receive a letter, please call our office to schedule the follow-up appointment.   Any Other Special Instructions Will Be Listed Below (If Applicable).     If you need a refill on your cardiac medications before your next appointment, please call your pharmacy.   

## 2016-08-15 NOTE — Addendum Note (Signed)
Addended by: Eulis Foster on: 08/15/2016 11:12 AM   Modules accepted: Orders

## 2016-08-16 LAB — HEMOGLOBIN A1C
Hgb A1c MFr Bld: 6.3 % — ABNORMAL HIGH (ref <5.7)
Mean Plasma Glucose: 134 mg/dL

## 2016-08-21 ENCOUNTER — Telehealth: Payer: Self-pay | Admitting: Nutrition

## 2016-08-21 NOTE — Telephone Encounter (Signed)
Patient ask you to give her a call °

## 2016-08-21 NOTE — Telephone Encounter (Signed)
I phoned the patient, but noone answered, and no answering machine.

## 2016-08-22 ENCOUNTER — Encounter: Payer: Medicare Other | Attending: Endocrinology | Admitting: Nutrition

## 2016-08-22 DIAGNOSIS — Z713 Dietary counseling and surveillance: Secondary | ICD-10-CM | POA: Insufficient documentation

## 2016-08-22 DIAGNOSIS — E10649 Type 1 diabetes mellitus with hypoglycemia without coma: Secondary | ICD-10-CM | POA: Diagnosis not present

## 2016-08-23 ENCOUNTER — Telehealth: Payer: Self-pay | Admitting: Pharmacist

## 2016-08-23 NOTE — Telephone Encounter (Signed)
Spoke with patient regarding referral to lipid clinic for management of elevated LDL of 194. She adamantly refuses to try or even discuss any prescription medications to lower her cholesterol. She is only interested in herbal products. Stated that she refuses to discuss Repatha because it can cause dizziness. Advised her that every medication can cause dizziness and that prescription medications are better regulated and safer than most herbals. Discussed her elevated risk for cardiac disease given her extremely elevated LDL. She still refused to discuss medications. Advised her that I would be more than happy to schedule a visit if she is willing to consider medication to lower her cholesterol. She is not interested at this time. Have routed this to her MDs as an FYI.

## 2016-08-28 NOTE — Progress Notes (Signed)
Pt. And her husband are here to learn about other pumps and cgm.  She is wearing the Animas pump, which has been discontinues, and is needing to get a new pump.  She was shown the other pumps, and we discussed the advantages/disadvantages of each model.  She was given brochures and told to go on line for further information.  She was given the drug rep.'s names of each of the different pumps to call if more questions.   They had no final questions.

## 2016-08-28 NOTE — Patient Instructions (Signed)
Review information given, and call if questions.

## 2016-08-28 NOTE — Telephone Encounter (Signed)
Patient and husband seen as office visit on 08/27/16

## 2016-09-14 ENCOUNTER — Telehealth: Payer: Self-pay | Admitting: Endocrinology

## 2016-09-14 NOTE — Telephone Encounter (Signed)
Pt called in with some questions regarding her insulin, she would like a call back.

## 2016-09-17 ENCOUNTER — Telehealth: Payer: Self-pay | Admitting: Nutrition

## 2016-09-18 NOTE — Telephone Encounter (Signed)
Called patient but no voice mail picked up I will try again later

## 2016-09-20 NOTE — Telephone Encounter (Signed)
Patient spoke with [name] and this has been resolved

## 2016-10-06 ENCOUNTER — Other Ambulatory Visit: Payer: Self-pay | Admitting: Endocrinology

## 2016-10-18 ENCOUNTER — Other Ambulatory Visit: Payer: Medicare Other

## 2016-10-23 ENCOUNTER — Ambulatory Visit: Payer: Medicare Other | Admitting: Endocrinology

## 2016-10-23 DIAGNOSIS — E063 Autoimmune thyroiditis: Secondary | ICD-10-CM | POA: Diagnosis not present

## 2016-10-23 DIAGNOSIS — E039 Hypothyroidism, unspecified: Secondary | ICD-10-CM | POA: Diagnosis not present

## 2016-10-23 DIAGNOSIS — E109 Type 1 diabetes mellitus without complications: Secondary | ICD-10-CM | POA: Diagnosis not present

## 2016-10-23 DIAGNOSIS — E28310 Symptomatic premature menopause: Secondary | ICD-10-CM | POA: Diagnosis not present

## 2016-11-06 ENCOUNTER — Telehealth: Payer: Self-pay | Admitting: Endocrinology

## 2016-11-06 NOTE — Telephone Encounter (Signed)
Patient need prescription for one touch ultra 2 [facility] pharmacy  Phone # [phone]  Fax # [phone]

## 2016-11-07 ENCOUNTER — Other Ambulatory Visit: Payer: Self-pay

## 2016-11-07 MED ORDER — GLUCOSE BLOOD VI STRP: ORAL_STRIP | 5 refills | Status: AC

## 2016-11-09 ENCOUNTER — Other Ambulatory Visit: Payer: Self-pay

## 2016-11-09 NOTE — Telephone Encounter (Signed)
Done

## 2016-11-12 ENCOUNTER — Telehealth: Payer: Self-pay | Admitting: Endocrinology

## 2016-11-12 NOTE — Telephone Encounter (Signed)
Pt called and would like to make sure, or have these orders added to her lab appointment: A1C CRP Homocysteine Lipid Panel Magnesium Levels

## 2016-11-12 NOTE — Telephone Encounter (Signed)
Pt also wants to be sure her One Touch Meter was sent to the [facility] in [place].

## 2016-11-13 ENCOUNTER — Other Ambulatory Visit: Payer: Self-pay

## 2016-11-13 MED ORDER — ONETOUCH ULTRA SYSTEM W/DEVICE KIT: 1.0000 | PACK | Freq: Once | 0 refills | Status: AC

## 2016-11-13 NOTE — Telephone Encounter (Signed)
Ordered meter 11/13/16

## 2016-11-26 ENCOUNTER — Other Ambulatory Visit (INDEPENDENT_AMBULATORY_CARE_PROVIDER_SITE_OTHER): Payer: PPO

## 2016-11-26 DIAGNOSIS — E1065 Type 1 diabetes mellitus with hyperglycemia: Secondary | ICD-10-CM

## 2016-11-26 LAB — COMPREHENSIVE METABOLIC PANEL
ALK PHOS: 65 U/L (ref 39–117)
ALT: 17 U/L (ref 0–35)
AST: 24 U/L (ref 0–37)
Albumin: 4 g/dL (ref 3.5–5.2)
BUN: 10 mg/dL (ref 6–23)
CO2: 30 meq/L (ref 19–32)
Calcium: 9.1 mg/dL (ref 8.4–10.5)
Chloride: 101 mEq/L (ref 96–112)
Creatinine, Ser: 0.73 mg/dL (ref 0.40–1.20)
GFR: 83.19 mL/min (ref >60.00)
GLUCOSE: 78 mg/dL (ref 70–99)
POTASSIUM: 4.1 meq/L (ref 3.5–5.1)
SODIUM: 137 meq/L (ref 135–145)
TOTAL PROTEIN: 6.7 g/dL (ref 6.0–8.3)
Total Bilirubin: 0.5 mg/dL (ref 0.2–1.2)

## 2016-11-26 LAB — HEMOGLOBIN A1C: HEMOGLOBIN A1C: 6.7 % — AB (ref 4.6–6.5)

## 2016-11-26 LAB — LIPID PANEL
CHOL/HDL RATIO: 3
Cholesterol: 215 mg/dL — ABNORMAL HIGH (ref 0–200)
HDL: 67.3 mg/dL (ref >39.00)
LDL CALC: 137 mg/dL — AB (ref 0–99)
NONHDL: 147.44
Triglycerides: 52 mg/dL (ref 0.0–149.0)
VLDL: 10.4 mg/dL (ref 0.0–40.0)

## 2016-11-27 ENCOUNTER — Encounter: Payer: Self-pay | Attending: Endocrinology | Admitting: Nutrition

## 2016-11-27 DIAGNOSIS — E109 Type 1 diabetes mellitus without complications: Secondary | ICD-10-CM

## 2016-11-27 DIAGNOSIS — Z713 Dietary counseling and surveillance: Secondary | ICD-10-CM | POA: Insufficient documentation

## 2016-11-27 DIAGNOSIS — E10649 Type 1 diabetes mellitus with hypoglycemia without coma: Secondary | ICD-10-CM | POA: Insufficient documentation

## 2016-11-29 ENCOUNTER — Encounter: Payer: Self-pay | Admitting: Endocrinology

## 2016-11-29 ENCOUNTER — Ambulatory Visit (INDEPENDENT_AMBULATORY_CARE_PROVIDER_SITE_OTHER): Payer: PPO | Admitting: Endocrinology

## 2016-11-29 VITALS — BP 138/70 | HR 61 | Ht 64.0 in | Wt 152.0 lb

## 2016-11-29 DIAGNOSIS — E7800 Pure hypercholesterolemia, unspecified: Secondary | ICD-10-CM | POA: Diagnosis not present

## 2016-11-29 DIAGNOSIS — E038 Other specified hypothyroidism: Secondary | ICD-10-CM

## 2016-11-29 DIAGNOSIS — E1065 Type 1 diabetes mellitus with hyperglycemia: Secondary | ICD-10-CM | POA: Diagnosis not present

## 2016-11-29 DIAGNOSIS — E063 Autoimmune thyroiditis: Secondary | ICD-10-CM

## 2016-11-29 MED ORDER — FREESTYLE LIBRE SENSOR SYSTEM MISC
1.0000 | 3 refills | Status: DC
Start: 2016-11-29 — End: 2017-06-19

## 2016-11-29 MED ORDER — FREESTYLE LIBRE READER DEVI: 1.0000 | 0 refills | Status: DC

## 2016-11-29 NOTE — Progress Notes (Signed)
Patient ID: Gina [name], female   DOB: 1946/07/20, 71 y.o.   MRN: [id number]   Reason for Appointment: Insulin Pump followup:   History of Present Illness   Diagnosis: Type 1 DIABETES MELITUS, date of diagnosis:  1984     CURRENT insulin pump:  One Touch Ping, has been on a pump since 1995  HISTORY:   The pump SETTINGS are: Midnight = 0.45, 4 AM 0.95.  6 AM = 1.05, 8:30 AM = 1.15, 11:30 AM = 1.0;, 6 PM = 0.8 and 9 PM = 0.75.   Total insulin dose about  30 units per day with 66 % in basal Carbohydrate ratio 1:10 before 12 noon, 1:7 for lunch and 1: 8 After 5 PM Blood sugar target 110 .  Correction factor 1: 30   A1c is now fairly stable at 6.7, previously 6.3  Current management, blood sugar patterns and problems identified:   She started using the One Touch ultra meter but she is still not entering her readings in the pump consistently and has not tried to get the ultra Link monitor.  FASTING readings recently having fairly frequently low and she did not report this, she thinks she does not have a lot of symptoms in the mornings even with blood sugars as low as 44  Blood sugars are trending higher with day in early afternoon  Blood sugars are usually fairly close to target around suppertime with one low sugar  Postprandial readings after supper are not checked because sometimes she is eating late in the evening  Some of her HYPERGLYCEMIA is related to her forgetting to bolus when she is eating especially at lunchtime, highest blood sugar 418 with this  She also says that she likes to keep her blood sugar at 200 before she goes walking.  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 Hgb A1c MFr Bld 11/26/2016 6.7* 4.6 - 6.5 % Final  . Sodium 11/26/2016 137  135 - 145 mEq/L Final  . Potassium 11/26/2016 4.1  3.5 - 5.1 mEq/L Final  . Chloride 11/26/2016 101  96 - 112 mEq/L Final  . CO2 11/26/2016 30  19 - 32 mEq/L Final  . Glucose, Bld 11/26/2016 78  70 - 99 mg/dL  Final  . BUN 11/26/2016 10  6 - 23 mg/dL Final  . Creatinine, Ser 11/26/2016 0.73  0.40 - 1.20 mg/dL Final  . Total Bilirubin 11/26/2016 0.5  0.2 - 1.2 mg/dL Final  . Alkaline Phosphatase 11/26/2016 65  39 - 117 U/L Final  . AST 11/26/2016 24  0 - 37 U/L Final  . ALT 11/26/2016 17  0 - 35 U/L Final  . Total Protein 11/26/2016 6.7  6.0 - 8.3 g/dL Final  . Albumin 11/26/2016 4.0  3.5 - 5.2 g/dL Final  . Calcium 11/26/2016 9.1  8.4 - 10.5 mg/dL Final  . GFR 11/26/2016 83.19  >60.00 mL/min Final  . Cholesterol 11/26/2016 215* 0 - 200 mg/dL Final  . Triglycerides 11/26/2016 52.0  0.0 - 149.0 mg/dL Final  . HDL 11/26/2016 67.30  >39.00 mg/dL Final  . VLDL 11/26/2016 10.4  0.0 - 40.0 mg/dL Final  . LDL Cholesterol 11/26/2016 137* 0 - 99 mg/dL Final  . Total CHOL/HDL Ratio 11/26/2016 3   Final  . NonHDL 11/26/2016 147.44   Final       Allergies as of 11/29/2016      Reactions   Statins Other (See Comments)   Leg pain & severe fatigue      Medication List       Accurate as of 11/29/16 10:44 AM. Always use your most recent med list.          apixaban 5 MG Tabs tablet Commonly known as:  ELIQUIS Take 1 tablet (5 mg total) by mouth 2 (two) times daily.   Cod Liver Oil 1000 MG Caps Take 2,000 mg by mouth daily.   CoQ10 200 MG Caps Take 200 mg by mouth daily.   glucosamine-chondroitin 500-400 MG tablet Take 2 tablets by mouth daily.   glucose blood test strip Commonly known as:  ONE TOUCH ULTRA TEST Use asto check blood sugar 5 times a day   HUMALOG 100 UNIT/ML injection Generic drug:  insulin lispro BASAL RATE 0.9 7AM-12, 0.8 NOON-7 AM. MAX 60 UNITS SUBCUTANEOUSLY EVERY DAY WITH PUMP PER MD   HUMALOG 100 UNIT/ML injection Generic drug:  insulin lispro BASAL RATE 0.9 7 AM-12, 0.8 NOON-7AM. MAX 60 UNITS SUBCUTANEOUSLY EVERY DAY WITH PUMP PER MD   Magnesium 400 MG Caps Take 400 mg by mouth 2 (two) times daily.   Menaquinone-7 100 MCG Caps Take 150 mcg by mouth daily.   NALTREXONE HCL PO Take 4.5 mg by mouth daily.   PROGESTERONE EX Apply 1 ml to skin daily   RESVERATROL PO Take 250 mg by mouth daily.   thyroid 15 MG tablet Commonly known as:  ARMOUR Take 15 mg by mouth daily.   thyroid 30 MG tablet Commonly known as:  ARMOUR Take 30 mg by mouth daily before breakfast.   Turmeric Curcumin 500 MG Caps Take 1 tablet by mouth 3 (three) times daily.   Vitamin B-12 5000 MCG Subl Place 5,000 mcg under the tongue daily.   vitamin C 1000 MG tablet Take 1,000 mg by mouth daily.   VITAMIN D PO Take 5,000 mcg by mouth daily.       Allergies:  Allergies  Allergen Reactions  . Statins Other (See Comments)    Leg pain & severe fatigue    Past Medical History:  Diagnosis Date  . Anxiety    new dx  . Atrial fibrillation ([place])    a. 03/2015 s/p RFCA;  b. CHA2DS2VASc =  5-->Eliquis.  . 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 Diabetes mellitus type 1 ([facility])    Insulin pump  . Diastolic CHF ([place])    a. 11/5425 - due to AF RVR;  b. 03/2015 Echo: EF 60-65%, no rwma.  . Hyperlipidemia   . Hypertension   . Hypothyroidism   . Insulin pump in place   . Ovarian cyst   . S/P radiation therapy 04/10/12- 05/26/2012   left Breast and Axilla / 50 gy / 25 Fractions with Left Breast Boost / 10 Gy / 5 Fractions  . SIADH (syndrome of inappropriate ADH production) ([place])   . Use of letrozole ([place]) 06/13/12    Past Surgical History:  Procedure Laterality Date  . APPENDECTOMY    . 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 COLONOSCOPY     1 polyp   . ELECTROPHYSIOLOGIC STUDY N/A 04/08/2015   PVI + CTI ablation + posterior Box lesion + Atach ablation by [name]  . OVARIAN CYST SURGERY    . TEE WITHOUT CARDIOVERSION N/A 04/07/2015   Procedure: TRANSESOPHAGEAL ECHOCARDIOGRAM (TEE);  Surgeon: Thayer Headings, MD;  Location: [facility] END[facility];  Service: Cardiovascular;  Laterality: N/A;  . TONSILLECTOMY      Family History  Problem Relation Age of Onset  . Breast cancer Mother 47  . Heart failure Mother   . Prostate cancer Maternal Uncle     died in his 61s  . Heart attack Father   . Diabetes Maternal Uncle 15    Social History:  reports that she has never smoked. She has never used smokeless tobacco. She reports that she does not drink alcohol or use drugs.  REVIEW of systems:   She has a history of mild hypothyroidism followed by a holistic physician Currently she is getting a regimen of '45mg'$  [place] Thyroid   Lab Results  Component Value Date   TSH 1.07 07/10/2016    HYPERCHOLESTEROLEMIA: Her LDL has been  persistently high and refuses to take a statin drug including pravastatin even though it had no side effects previously.  She was previously trying to use red rice yeast , none now Previously her LDL particle number is consistently high at 1621 More recently she has been better convinced about needing treatment for her hypercholesterolemia  She was given a prescription for Crestor which she thinks she tried for 3-4 weeks but could not continue because she felt it was giving her headaches and making her tired  She again is insisting on using natural remedies and essential oils for her hypercholesterolemia She said that she is applying a concoction of several oils on her abdominal skin  Her LDL is relatively better , previously 194    Lab Results  Component Value Date   CHOL 215 (H) 11/26/2016   HDL 67.30 11/26/2016   LDLCALC 137 (H) 11/26/2016   LDLDIRECT 148.4 08/06/2013   TRIG 52.0 11/26/2016   CHOLHDL 3 11/26/2016   . She takes numerous herbal supplements   EXAM:  BP 138/70   Pulse 61   Ht '5\' 4"'$  (1.626 m)   Wt 152 lb (68.9 kg)   SpO2 98%   BMI 26.09 kg/m    ASSESSMENT:  DIABETES:  See history of present illness for detailed discussion of current management, blood sugar patterns and problems  identified  Her A1c is still fairly good at 6.8 For some reason she is starting to get hypoglycemic before breakfast around 7-9 AM fairly frequently recently She is also having tendency to high readings later in the morning and early afternoon Some of these high readings are from not bolusing when she is eating lunch as she forgets Also not checking enough readings after meals to help assess her level of control after meals She tries to walk regularly but does not use temporary basal or other ways to prevent low sugars, she lets her sugar go up high before she goes walking  RECOMMENDATIONS:  Start using the freestyle [place] sensor  Discussed in detail how this is used and  will try to get this from her pump supply company  Otherwise may need to use the One Touch ultra-link meter which will link to her pump   More consistent bolusing at every meal that has carbohydrate  Suspend the pump for walking instead of raising the blood sugar pre-exercise  Call if blood sugars are starting to get low  Have protein with every meal  BASAL rate changes: 6 AM-8.30 AM = 0.95, 11:30 AM = 1.0, 4 PM = 0.95 and 6 PM unchanged at 0.8  HYPERTENSION:  well controlled   HYPERLIPIDEMIA: She has much better lipid levels surprisingly with using topical essential oils that she has been using for the last few weeks She refuses to consider any pharmacological treatment again   HYPOTHYROIDISM: Adequately replaced previously and will need follow-up  Counseling time on subjects discussed above is over 50% of today's 25 minute visit  Danaysia Rader 11/29/2016, 10:44 AM

## 2016-12-06 NOTE — Patient Instructions (Signed)
Call to set up an appt. For pump training when new pump comes in

## 2016-12-06 NOTE — Progress Notes (Signed)
Pt,'s pump case is cracked, and is out of warrenty.  She is wanting to see what new pumps are out now, and she is wanting the easiest one.  She was shown all 3 pumps, and she chose the Tandem pump.  Paperwork was filled out and faxed in.  She was told to call if when she gets her pump to set up a time for training.

## 2016-12-14 DIAGNOSIS — H2513 Age-related nuclear cataract, bilateral: Secondary | ICD-10-CM | POA: Diagnosis not present

## 2016-12-14 DIAGNOSIS — E119 Type 2 diabetes mellitus without complications: Secondary | ICD-10-CM | POA: Diagnosis not present

## 2016-12-14 LAB — HM DIABETES EYE EXAM

## 2016-12-17 ENCOUNTER — Telehealth (HOSPITAL_COMMUNITY): Payer: Self-pay | Admitting: *Deleted

## 2016-12-17 ENCOUNTER — Telehealth: Payer: Self-pay | Admitting: Endocrinology

## 2016-12-17 NOTE — Telephone Encounter (Signed)
Pt cld after receiving a recall letter for a 6 month follow up.  She stated that she did not see the purpose in following up over here when Dr. Rayann Heman released her for a year back in November.  She will not schedule with afib clinic at this time.

## 2016-12-17 NOTE — Telephone Encounter (Signed)
Patient ask you to give her a call °

## 2016-12-19 ENCOUNTER — Other Ambulatory Visit: Payer: Self-pay

## 2016-12-19 MED ORDER — INSULIN ASPART 100 UNIT/ML ~~LOC~~ SOLN: SUBCUTANEOUS | 3 refills | Status: DC

## 2016-12-19 NOTE — Telephone Encounter (Signed)
She can switch to NovoLog same doses for her pump

## 2016-12-19 NOTE — Telephone Encounter (Signed)
rx submitted.  

## 2016-12-19 NOTE — Telephone Encounter (Signed)
Patient stated insurance will not cover Humalog, it need to be changed to Novalog fast acting. Please advise getting low on insulin,

## 2017-01-02 NOTE — Telephone Encounter (Signed)
Kim calling from [employer] a calling on a  referral was sent to our office.  [phone]

## 2017-01-03 ENCOUNTER — Encounter: Payer: Self-pay | Admitting: Family Medicine

## 2017-02-05 ENCOUNTER — Other Ambulatory Visit (HOSPITAL_COMMUNITY): Payer: Self-pay | Admitting: Nurse Practitioner

## 2017-02-06 NOTE — Telephone Encounter (Signed)
Age 71 Wt 68.9 11/29/2016 Saw Dr Allred 08/15/2016  SrCr 0.73 11/26/2016 05/26/2015 Hgb 12.6 HCT 38.6 Refill done on Eliquis 5mg  q 12 hours

## 2017-02-21 DIAGNOSIS — C50911 Malignant neoplasm of unspecified site of right female breast: Secondary | ICD-10-CM | POA: Diagnosis not present

## 2017-02-21 DIAGNOSIS — E109 Type 1 diabetes mellitus without complications: Secondary | ICD-10-CM | POA: Diagnosis not present

## 2017-02-21 DIAGNOSIS — I10 Essential (primary) hypertension: Secondary | ICD-10-CM | POA: Diagnosis not present

## 2017-02-21 DIAGNOSIS — E034 Atrophy of thyroid (acquired): Secondary | ICD-10-CM | POA: Diagnosis not present

## 2017-02-21 DIAGNOSIS — M65311 Trigger thumb, right thumb: Secondary | ICD-10-CM | POA: Diagnosis not present

## 2017-02-21 DIAGNOSIS — E7800 Pure hypercholesterolemia, unspecified: Secondary | ICD-10-CM | POA: Diagnosis not present

## 2017-02-25 ENCOUNTER — Other Ambulatory Visit: Payer: PPO

## 2017-02-26 ENCOUNTER — Other Ambulatory Visit (INDEPENDENT_AMBULATORY_CARE_PROVIDER_SITE_OTHER): Payer: PPO

## 2017-02-26 DIAGNOSIS — E1065 Type 1 diabetes mellitus with hyperglycemia: Secondary | ICD-10-CM | POA: Diagnosis not present

## 2017-02-26 LAB — LIPID PANEL
CHOLESTEROL: 193 mg/dL (ref 0–200)
HDL: 63.4 mg/dL (ref >39.00)
LDL Cholesterol: 120 mg/dL — ABNORMAL HIGH (ref 0–99)
NonHDL: 129.44
TRIGLYCERIDES: 46 mg/dL (ref 0.0–149.0)
Total CHOL/HDL Ratio: 3
VLDL: 9.2 mg/dL (ref 0.0–40.0)

## 2017-02-26 LAB — URINALYSIS, ROUTINE W REFLEX MICROSCOPIC
BILIRUBIN URINE: NEGATIVE
Hgb urine dipstick: NEGATIVE
KETONES UR: NEGATIVE
LEUKOCYTES UA: NEGATIVE
Nitrite: NEGATIVE
PH: 8 (ref 5.0–8.0)
RBC / HPF: NONE SEEN (ref 0–?)
Specific Gravity, Urine: 1.015 (ref 1.000–1.030)
Total Protein, Urine: NEGATIVE
UROBILINOGEN UA: 0.2 (ref 0.0–1.0)
Urine Glucose: NEGATIVE

## 2017-02-26 LAB — TSH: TSH: 0.88 u[IU]/mL (ref 0.35–4.50)

## 2017-02-26 LAB — COMPREHENSIVE METABOLIC PANEL
ALBUMIN: 3.9 g/dL (ref 3.5–5.2)
ALT: 22 U/L (ref 0–35)
AST: 27 U/L (ref 0–37)
Alkaline Phosphatase: 64 U/L (ref 39–117)
BUN: 14 mg/dL (ref 6–23)
CALCIUM: 9.2 mg/dL (ref 8.4–10.5)
CHLORIDE: 100 meq/L (ref 96–112)
CO2: 31 mEq/L (ref 19–32)
CREATININE: 0.82 mg/dL (ref 0.40–1.20)
GFR: 72.7 mL/min (ref >60.00)
Glucose, Bld: 89 mg/dL (ref 70–99)
POTASSIUM: 4.4 meq/L (ref 3.5–5.1)
Sodium: 135 mEq/L (ref 135–145)
Total Bilirubin: 0.5 mg/dL (ref 0.2–1.2)
Total Protein: 6.5 g/dL (ref 6.0–8.3)

## 2017-02-26 LAB — MICROALBUMIN / CREATININE URINE RATIO
CREATININE, U: 109.9 mg/dL
Microalb Creat Ratio: 0.8 mg/g (ref 0.0–30.0)
Microalb, Ur: 0.9 mg/dL (ref 0.0–1.9)

## 2017-02-26 LAB — HEMOGLOBIN A1C: HEMOGLOBIN A1C: 6.5 % (ref 4.6–6.5)

## 2017-03-04 ENCOUNTER — Encounter: Payer: Self-pay | Admitting: Endocrinology

## 2017-03-04 ENCOUNTER — Ambulatory Visit (INDEPENDENT_AMBULATORY_CARE_PROVIDER_SITE_OTHER): Payer: PPO | Admitting: Endocrinology

## 2017-03-04 VITALS — BP 118/72 | HR 65 | Ht 64.0 in | Wt 153.4 lb

## 2017-03-04 DIAGNOSIS — E063 Autoimmune thyroiditis: Secondary | ICD-10-CM | POA: Diagnosis not present

## 2017-03-04 DIAGNOSIS — E782 Mixed hyperlipidemia: Secondary | ICD-10-CM

## 2017-03-04 DIAGNOSIS — E1065 Type 1 diabetes mellitus with hyperglycemia: Secondary | ICD-10-CM

## 2017-03-04 NOTE — Progress Notes (Addendum)
Patient ID: Gina [name], female   DOB: Apr 08, 1946, 71 y.o.   MRN: [id number]   Reason for Appointment: Insulin Pump followup:   History of Present Illness   Diagnosis: Type 1 DIABETES MELITUS, date of diagnosis:  1984     CURRENT insulin pump:  One Touch Ping, has been on a pump since 1995  HISTORY:    The pump SETTINGS are: Midnight = 0.45, 4 AM 0.95.  6 AM = 1.025, 8:30 AM = 1.15, 11:30 AM = 1.1;, 4PM = 0.95 and 6 PM = 0.8   Total insulin dose about  30 units per day with 66 % in basal Carbohydrate ratio 1:10 before 12 noon, 1:7 for lunch and 1: 8 After 5 PM Blood sugar target 110 .  Correction factor 1: 30   A1c is now fairly stable at 6.7, previously 6.3  Current management, blood sugar patterns and problems identified:   She is using the One Touch ultra meter but she is still not entering most her readings in the pump   Her blood sugars were also reviewed today by download up meter that she brought  FASTING readings recently are periodically low normal around 7 AM or later, lowest blood sugar 54  She did not change her basal rate as recommended on her last visit  HYPOGLYCEMIA: She was having very frequent hypoglycemia in the last week of April and first week of May but this appears to be fairly inconsistent now and mostly around lunchtime.  This may have been from her being more active but she does not remember why  Still has sporadic postprandial hyperglycemia at suppertime either from inadequate coverage, late boluses.  She still does not use a temporary basal when she is more active including the last weekend when she was walking during the day  She appears to be getting excessive hypoglycemia at times when she is taking correction doses for high readings and she has noticed this herself  Her weight is about the same  She was asked to try and use the freestyle Libre sensor but she had difficulty getting it and she does not know where she tried to get it  from  [facility] with the pump is assessed today by pump download  She is checking her blood sugar 6 times daily on an average as documented on her One Touch meter  Mean values apply above for all meters except median for One Touch  PRE-MEAL Fasting Lunch Dinner Bedtime Overall  Glucose range: 54-210  46-238   50-1 61     Mean/median: 116  121   186  122   POST-MEAL PC Breakfast PC Lunch PC Dinner  Glucose range:   55-3 61   Mean/median:  141      Prior blood sugar data:  Mean values apply above for all meters except median for One Touch  PRE-MEAL Fasting Lunch Dinner Bedtime Overall  Glucose range: 44-101 116-353  56-157     Mean/median: 70  172   102  104    EXERCISE: Walking when weather allows, about 30-60 minutes before or after lunch   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 TSH 02/26/2017 0.88  0.35 - 4.50 uIU/mL Final  . Hgb A1c MFr Bld 02/26/2017 6.5  4.6 - 6.5 % Final   Glycemic Control Guidelines for People with Diabetes:Non Diabetic:  <6%Goal of Therapy: <7%Additional Action Suggested:  >8%   . Sodium 02/26/2017 135  135 - 145 mEq/L Final  . Potassium 02/26/2017 4.4  3.5 - 5.1 mEq/L Final  . Chloride 02/26/2017 100  96 - 112 mEq/L Final  . CO2 02/26/2017 31  19 - 32 mEq/L Final  . Glucose, Bld 02/26/2017 89  70 - 99 mg/dL Final  . BUN 02/26/2017 14  6 - 23 mg/dL Final  . Creatinine, Ser 02/26/2017 0.82  0.40 - 1.20 mg/dL Final  . Total Bilirubin  02/26/2017 0.5  0.2 - 1.2 mg/dL Final  . Alkaline Phosphatase 02/26/2017 64  39 - 117 U/L Final  . AST 02/26/2017 27  0 - 37 U/L Final  . ALT 02/26/2017 22  0 - 35 U/L Final  . Total Protein 02/26/2017 6.5  6.0 - 8.3 g/dL Final  . Albumin 02/26/2017 3.9  3.5 - 5.2 g/dL Final  . Calcium 02/26/2017 9.2  8.4 - 10.5 mg/dL Final  . GFR 02/26/2017 72.70  >60.00 mL/min Final  . Cholesterol 02/26/2017 193  0 - 200 mg/dL Final   ATP III Classification       Desirable:  < 200 mg/dL               Borderline High:  200 - 239 mg/dL          High:  > = 240 mg/dL  . Triglycerides 02/26/2017 46.0  0.0 - 149.0 mg/dL Final   Normal:  <150 mg/dLBorderline High:  150 - 199 mg/dL  . HDL 02/26/2017 63.40  >39.00 mg/dL Final  . VLDL 02/26/2017 9.2  0.0 - 40.0 mg/dL Final  . LDL Cholesterol 02/26/2017 120* 0 - 99 mg/dL Final  . Total CHOL/HDL Ratio 02/26/2017 3   Final                  Men          Women1/2 Average Risk     3.4          3.3Average Risk          5.0          4.42X Average Risk          9.6          7.13X Average Risk          15.0          11.0                      . NonHDL 02/26/2017 129.44   Final   NOTE:  Non-HDL goal should be 30 mg/dL higher than patient's LDL goal (i.e. LDL goal of < 70 mg/dL, would have non-HDL goal of < 100 mg/dL)  . Microalb, Ur 02/26/2017 0.9  0.0 - 1.9 mg/dL Final  . Creatinine,U 02/26/2017 109.9  mg/dL Final  . Microalb Creat Ratio 02/26/2017 0.8  0.0 - 30.0 mg/g Final  . Color, Urine 02/26/2017 YELLOW  Yellow;Lt. Yellow  Final  . APPearance 02/26/2017 Sl Cloudy* Clear Final  . Specific Gravity, Urine 02/26/2017 1.015  1.000 - 1.030 Final  . pH 02/26/2017 8.0  5.0 - 8.0 Final  . Total Protein, Urine 02/26/2017 NEGATIVE  Negative Final  . Urine Glucose 02/26/2017 NEGATIVE  Negative Final  . Ketones, ur 02/26/2017 NEGATIVE  Negative Final  . Bilirubin Urine 02/26/2017 NEGATIVE  Negative Final  . Hgb urine dipstick 02/26/2017 NEGATIVE  Negative Final  . Urobilinogen,  UA 02/26/2017 0.2  0.0 - 1.0 Final  . Leukocytes, UA 02/26/2017 NEGATIVE  Negative Final  . Nitrite 02/26/2017 NEGATIVE  Negative Final  . WBC, UA 02/26/2017 0-2/hpf  0-2/hpf Final  . RBC / HPF 02/26/2017 none seen  0-2/hpf Final  . Squamous Epithelial / LPF 02/26/2017 Rare(0-4/hpf)  Rare(0-4/hpf) Final     Allergies as of 03/04/2017      Reactions   Statins Other (See Comments)   Leg pain & severe fatigue      Medication List       Accurate as of 03/04/17 10:29 AM. Always use your most recent med list.          Cod Liver Oil 1000 MG Caps Take 2,000 mg by mouth daily.   CoQ10 200 MG Caps Take 200 mg by mouth daily.   ELIQUIS 5 MG Tabs tablet Generic drug:  apixaban TAKE 1 TABLET BY MOUTH TWICE A DAY   FREESTYLE LIBRE READER Devi 1 Device by Does not apply route as directed.   FREESTYLE LIBRE SENSOR SYSTEM Misc 1 strip by Does not apply route once a week. Apply to upper arm and change sensor every 10 days   glucosamine-chondroitin 500-400 MG tablet Take 2 tablets by mouth daily.   glucose blood test strip Commonly known as:  ONE TOUCH ULTRA TEST Use asto check blood sugar 5 times a day   insulin aspart 100 UNIT/ML injection Commonly known as:  NOVOLOG BASAL RATE 0.9 7AM-12, 0.8 NOON-7 AM. MAX 60 UNITS SUBCUTANEOUSLY EVERY DAY WITH PUMP PER MD   Magnesium 400 MG Caps Take 400 mg by mouth 2 (two) times daily.   Menaquinone-7 100 MCG Caps Take 150 mcg by mouth daily.   NALTREXONE HCL PO Take 4.5 mg by mouth daily.   PROGESTERONE EX Apply 1 ml to skin daily   Red Yeast Rice 600 MG Caps Take by mouth. Takes 2 capsules daily   RESVERATROL PO Take 250 mg by mouth daily.   SUPER OMEGA-3 PO Take by mouth. Takes 2 capsules per day   thyroid 15 MG tablet Commonly known as:  ARMOUR Take 15 mg by mouth daily.   thyroid 30 MG tablet Commonly known as:  ARMOUR Take 30 mg by mouth daily before breakfast.   Turmeric Curcumin 500 MG Caps Take 1 tablet by  mouth 3 (three) times daily.   Vitamin B-12 5000 MCG Subl Place 5,000 mcg under the tongue daily.   vitamin C 1000 MG tablet Take 1,000 mg by mouth daily.   VITAMIN D PO Take 5,000 mcg by mouth daily.       Allergies:  Allergies  Allergen Reactions  . Statins Other (See Comments)    Leg pain & severe fatigue    Past Medical History:  Diagnosis Date  . Anxiety    new dx  . Atrial fibrillation ([place])    a. 03/2015 s/p RFCA;  b. CHA2DS2VASc = 5-->Eliquis.  . 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 Diabetes mellitus type 1 ([facility])    Insulin pump  . Diastolic CHF ([place])    a. 12/5595 - due to AF RVR;  b. 03/2015 Echo: EF 60-65%, no rwma.  . Hyperlipidemia   . Hypertension   . Hypothyroidism   . Insulin pump in place   . Ovarian cyst   . S/P radiation therapy 04/10/12- 05/26/2012   left Breast and Axilla / 50 gy / 25 Fractions with Left Breast Boost / 10 Gy / 5 Fractions  . SIADH (syndrome of inappropriate ADH production) ([place])   . Use of letrozole ([place]) 06/13/12    Past Surgical History:  Procedure Laterality Date  . APPENDECTOMY    . 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 COLONOSCOPY     1 polyp   . ELECTROPHYSIOLOGIC STUDY N/A 04/08/2015   PVI + CTI ablation + posterior Box lesion + Atach ablation by [name]  . OVARIAN CYST SURGERY    . TEE WITHOUT CARDIOVERSION N/A 04/07/2015   Procedure: TRANSESOPHAGEAL ECHOCARDIOGRAM (TEE);  Surgeon: Thayer Headings, MD;  Location: [facility] ENDOSCOPY;  Service: Cardiovascular;  Laterality: N/A;  . TONSILLECTOMY      Family History  Problem Relation Age of Onset  . Breast cancer Mother 49  . Heart failure Mother   . Prostate cancer Maternal Uncle        died in his 10s  .  Heart attack Father   . Diabetes Maternal Uncle 15    Social History:  reports that she has never smoked. She has never used smokeless tobacco. She reports that she does not drink alcohol or use drugs.  REVIEW of systems:   She has a history of mild hypothyroidism followed by a holistic physician Again she is getting a regimen of 46m Armour Thyroid Recent TSH normal   Lab Results  Component Value Date   TSH 0.88 02/26/2017    HYPERCHOLESTEROLEMIA: Her LDL has been persistently high and refuses to take a statin drug including pravastatin even though it had no side effects previously.  She was previously trying to use red rice yeast , none now Previously her LDL particle number is consistently high at 1621 More recently she has been better convinced about needing treatment for her hypercholesterolemia  She was given a prescription for Crestor which she thinks she tried for 3-4 weeks but could not continue because she felt it was giving her headaches and making her tired  She again is insisting on using natural remedies and essential oils for her hypercholesterolemia She said that she is applying a concoction of several oils on her abdominal skin  Her LDL is relatively better, previously as high as 194  Lab Results  Component Value Date   CHOL 193 02/26/2017   CHOL 215 (H) 11/26/2016   CHOL 278 (H) 08/15/2016   Lab Results  Component Value Date   HDL 63.40 02/26/2017   HDL 67.30 11/26/2016   HDL 69 08/15/2016   Lab Results  Component Value Date   LDLCALC 120 (H) 02/26/2017   LDLCALC 137 (H) 11/26/2016   LDLCALC 194 (H) 08/15/2016   Lab Results  Component Value Date   TRIG 46.0 02/26/2017   TRIG 52.0 11/26/2016   TRIG 76 08/15/2016   Lab Results  Component Value Date  CHOLHDL 3 02/26/2017   CHOLHDL 3 11/26/2016   CHOLHDL 4.0 08/15/2016   Lab Results  Component Value Date   LDLDIRECT 148.4 08/06/2013   . She takes numerous herbal  supplements   EXAM:  BP 118/72   Pulse 65   Ht _0  (1.626 m)   Wt 153 lb 6.4 oz (69.6 kg)   SpO2 97%   BMI 26.33 kg/m    ASSESSMENT:  DIABETES:  See history of present illness for detailed discussion of current management, blood sugar patterns and problems identified  Her A1c is still fairly good at 6.8 For some reason she is starting to get hypoglycemic before breakfast around 7-9 AM fairly frequently recently She is also having tendency to high readings later in the morning and early afternoon Some of these high readings are from not bolusing when she is eating lunch as she forgets Also not checking enough readings after meals to help assess her level of control after meals She tries to walk regularly but does not use temporary basal or other ways to prevent low sugars, she lets her sugar go up high before she goes walking  RECOMMENDATIONS:  Start using the freestyle Libre sensor, she will contact the DME company from the list that was given today; this should help her to catch hypoglycemia in time and also may improve her compliance with boluses and adjusting boluses for various types of meals especially eating out  Described to her how this would be used and reviewed the instructions for starting this, printout given  She needs to be fairly consistent with bolusing before each meal or even a few minutes before  Blood sugar targets to be 120+/-10 instead of 110  She will let us know if she has consistently high readings after supper and will adjust her carbohydrate coverage  Day-to-day management of pump and diabetes discussed in detail  Sensitivity 1:40 instead of 1:30, may further need to adjust this especially by time of day needed  Again reminded her to suspend her pump or use of 50% of brain basal when she is doing any significant exercise  BASAL rate changes: 6 AM-8.30 AM = 0.975  HYPERTENSION:  well controlled   HYPERLIPIDEMIA: She has much better lipid  levels consistently and progressively with using topical essential oils that she has been using  She refuses to consider any pharmacological treatment even though statin drugs will offer more protective benefit and her LDL is still over 100   HYPOTHYROIDISM: Adequately replaced   Counseling time on subjects discussed above is over 50% of today's 25 minute visit  Kaelin Holford 03/04/2017, 10:29 AM

## 2017-03-05 ENCOUNTER — Telehealth: Payer: Self-pay | Admitting: Endocrinology

## 2017-03-05 NOTE — Telephone Encounter (Signed)
Patient is calling on the status of the  Free style libre monitoring system   Please advise

## 2017-03-08 ENCOUNTER — Other Ambulatory Visit: Payer: Self-pay

## 2017-03-08 NOTE — Telephone Encounter (Signed)
Called patient and she gave me some of the names on the list of mail order to get her [place] sensors from. Will reach out to them and send in a script.

## 2017-03-19 NOTE — Telephone Encounter (Signed)
Gina [name] is calling to retrieve meter downloads to show the patient testing 4x/day at least.  Fax # [phone]

## 2017-03-25 NOTE — Telephone Encounter (Signed)
We have to download the meter she is using

## 2017-03-26 ENCOUNTER — Telehealth: Payer: Self-pay

## 2017-03-26 NOTE — Telephone Encounter (Signed)
You can print it from One Touch Zoom program for 30 days

## 2017-03-26 NOTE — Telephone Encounter (Signed)
Spoke with the patient and she states a Neurosurgeon showing her testing 4 times daily was done in our office on 03/04/17 and she wants to know if this can be used please advise

## 2017-03-26 NOTE — Telephone Encounter (Signed)
Patient came in with her meter I downloaded and faxed readings to [facility]

## 2017-03-27 NOTE — Telephone Encounter (Signed)
Patient came in an I downloaded her meter and faxed readings to [facility]

## 2017-04-03 ENCOUNTER — Telehealth: Payer: Self-pay | Admitting: Endocrinology

## 2017-04-03 DIAGNOSIS — E1065 Type 1 diabetes mellitus with hyperglycemia: Secondary | ICD-10-CM | POA: Diagnosis not present

## 2017-04-03 NOTE — Telephone Encounter (Signed)
Pharmacy called to advise that the patient tests 6-8x a day. Patient is running out of rx too soon. Patient needs increased dosage for the glucose blood (ONE TOUCH ULTRA TEST) test strip. Call pharmacy today with verbal orders so the patient can get the rx today.

## 2017-04-03 NOTE — Telephone Encounter (Signed)
[employer] and spoke with Landen and let her know that patient is testing 6-8 times daily and to increase her One Touch Strip prescription. They are refilling her testing strips.

## 2017-04-18 ENCOUNTER — Telehealth: Payer: Self-pay | Admitting: Endocrinology

## 2017-04-18 NOTE — Telephone Encounter (Signed)
Patient tried to get supplies from [employer] but, they need the office notes sent to them before refilling anything. Patient is requesting a call from the nurse once this has been done.

## 2017-04-19 NOTE — Telephone Encounter (Signed)
Called patient and let her know that I have sent all the required material to [employer] for her new pump she is trying to get.

## 2017-04-29 NOTE — Telephone Encounter (Signed)
[facility] called to advise that the information submitted below did not have the associated DX code attached. He is sending a new paper and asking that the DX code be attached. He is also asking for blood sugar logs within the last 30 days (showing 4x/day)

## 2017-04-29 NOTE — Telephone Encounter (Signed)
Patient asking for DX code to be sent to [facility] for for infusion supplies.

## 2017-04-29 NOTE — Telephone Encounter (Signed)
I called [employer] and let them know that I would fax them again the blood sugars that we have and put the diagnosis code on this fax.

## 2017-05-01 ENCOUNTER — Telehealth: Payer: Self-pay | Admitting: Endocrinology

## 2017-05-01 NOTE — Telephone Encounter (Signed)
Patient is on her last supply for her infusion set supplies. Patient found out today that the infusion sets and supplies that she once received from [facility] are no longer in network with [facility] and the one that is in network doe snot carry infusion sets. Patient needs a call as soon as possible with guidance on what to do next. Patient awaiting phone call.

## 2017-05-02 ENCOUNTER — Telehealth: Payer: Self-pay | Admitting: Endocrinology

## 2017-05-02 NOTE — Telephone Encounter (Signed)
Called patient and let her know that I will call [facility] at [phone] since I do not see a fax from them on her OneTouch Ping Pump supply order.

## 2017-05-02 NOTE — Telephone Encounter (Signed)
Patient called in reference to a form being sent from [facility] for infusion sets for insulin pump.   Patient needs this filled as soon as possible. Patient runs out of the sets in 2 days.   Please call and advise patient when form has been filled out.

## 2017-05-02 NOTE — Telephone Encounter (Signed)
Patient calling to check the status of the note below, advised patient that it is being processed and Lattie Haw is waiting for a response from [facility].

## 2017-05-02 NOTE — Telephone Encounter (Signed)
Called patient and let her know that we may have a few insets to help her get through until the PA is complete. I will call her back if we have samples for her.

## 2017-05-02 NOTE — Telephone Encounter (Signed)
Called patient and let her know that I did see some insets that I can give to her until we can have the PA completed for her. I asked her to come in the morning so I can give to her.

## 2017-05-02 NOTE — Telephone Encounter (Signed)
[place] and spoke with Raquel Sarna about her order for pump supplies for her One Touch Ping pump. Kim spoke with me also and stated that she sent a fax over and is going to resend this fax. Maudie Mercury stated that she would have to have a PA for her supplies for her pump.

## 2017-05-03 ENCOUNTER — Other Ambulatory Visit: Payer: Self-pay | Admitting: Pharmacist

## 2017-05-03 DIAGNOSIS — E109 Type 1 diabetes mellitus without complications: Secondary | ICD-10-CM | POA: Diagnosis not present

## 2017-05-03 NOTE — Patient Outreach (Signed)
Gina [name] is referred to pharmacy by [employer] for medication assistance related to the cost of her Novolog insulin. Called and spoke with patient. HIPAA identifiers verified and verbal consent received.  [name] reports that she has entered the coverage gap of her plan. Patient reports that she also takes Eliquis, which will now be expensive. [name] reports that she has not yet applied for extra help through [employer]. Reports that she would not meet this program's requirements. Discuss with [name] the income and out-of-pocket requirements for the Novolog and Eliquis manufacturers' patient assistance programs. Note that [employer] program requires patient to have applied for extra help.  PLAN:  Schedule to complete the extra help application with [name] over the phone on Friday, 05/10/17, at 2 pm. At this time, will also follow up with [employer] to determine patient's current out-of-pocket expenditure for the calendar year.  Harlow Asa, PharmD, [place] [facility] [phone]

## 2017-05-06 NOTE — Telephone Encounter (Signed)
[place] is calling to follow up and see if you received the fax for pump supplie.s please verify with her @ # [phone]

## 2017-05-06 NOTE — Telephone Encounter (Signed)
Routing to you °

## 2017-05-07 NOTE — Telephone Encounter (Signed)
Called [facility] to make sure they received the fax back with the information they needed for patient and a lady confirmed that they received and have sent to be authorized.

## 2017-05-09 DIAGNOSIS — E109 Type 1 diabetes mellitus without complications: Secondary | ICD-10-CM | POA: Diagnosis not present

## 2017-05-10 ENCOUNTER — Other Ambulatory Visit: Payer: Self-pay | Admitting: Pharmacist

## 2017-05-10 NOTE — Patient Outreach (Signed)
Call to Gina [name] to complete the extra help application with Mrs. And Mr. Witter over the phone as scheduled. Called and spoke with patient. HIPAA identifiers verified and verbal consent received.  Gina [name] reports that something has come up today and asks that we reschedule for Wednesday, 05/15/17 at 9 am. Will call patient back at that time to complete the application.  Gina [name], PharmD, [facility] [place] [facility] [phone]

## 2017-05-14 NOTE — Telephone Encounter (Signed)
Pt.reports that she received her 3 month pump supplies from [employer], and that everything was correct.

## 2017-05-15 ENCOUNTER — Other Ambulatory Visit: Payer: Self-pay | Admitting: Pharmacist

## 2017-05-15 ENCOUNTER — Encounter: Payer: Self-pay | Admitting: Pharmacist

## 2017-05-15 NOTE — Patient Outreach (Signed)
Call Gina [name] as scheduled to complete the extra help application. Spoke with patient. HIPAA identifiers verified and verbal consent received.  Complete extra help application with the patient. Also spoke with patient's husband for assistance with information in completing the application. Received permission from both the patient and her husband to submit the application based on the answers that they each provided. Gina [name] requested the receipt of the submission of this application to be sent to her. I sent her this receipt via a secure MyChart message.  Advise Mrs and Gina [name] that they should expect a response from Social Security regarding this application in the next 2-4 weeks.  PLAN:  1) Gina [name] to look out for her response from [employer] and give me a call as soon as she receives this letter.  2) I will send a message to [employer] today to request the amount of the patient's current out of pocket pharmacy expenditure for the calendar year.  Harlow Asa, PharmD, [place] [facility] [phone]

## 2017-05-30 ENCOUNTER — Other Ambulatory Visit: Payer: PPO

## 2017-05-31 ENCOUNTER — Other Ambulatory Visit: Payer: Self-pay | Admitting: Pharmacist

## 2017-05-31 NOTE — Patient Outreach (Signed)
Receive a voicemail from Gina [name] letting me know that she received a letter back from [employer] stating that she was denied for extra help.   Contact patient's Part D plan for patient's current prescription out-of-pocket costs. Per plan, patient has not yet met the our-of-pocket requirement for the Novolog patient assistance program  Called and spoke with patient. HIPAA identifiers verified and verbal consent received. Gina [name] reports that she will follow with her pharmacy and let me know when she has spent the required $1000 out of pocket for the Novolog patient assistance program. Remind patient to retain the extra help denial letter.   Gina [name] reports that she has no further medication questions for me at this time. Patient confirms that she has my phone number.   PLAN:  If have not heard back from Gina [name] in 3 weeks, will follow up again at that time to discuss whether she has met the out-of-pocket expenditure requirements for the Novolog and Eliquis patient assistance programs.  Harlow Asa, PharmD, [place] [facility] [phone]

## 2017-06-03 DIAGNOSIS — E109 Type 1 diabetes mellitus without complications: Secondary | ICD-10-CM | POA: Diagnosis not present

## 2017-06-04 ENCOUNTER — Ambulatory Visit: Payer: PPO | Admitting: Endocrinology

## 2017-06-06 ENCOUNTER — Other Ambulatory Visit (INDEPENDENT_AMBULATORY_CARE_PROVIDER_SITE_OTHER): Payer: PPO

## 2017-06-06 DIAGNOSIS — E1065 Type 1 diabetes mellitus with hyperglycemia: Secondary | ICD-10-CM

## 2017-06-06 LAB — HEMOGLOBIN A1C: Hgb A1c MFr Bld: 6.6 % — ABNORMAL HIGH (ref 4.6–6.5)

## 2017-06-06 LAB — COMPREHENSIVE METABOLIC PANEL
ALBUMIN: 3.9 g/dL (ref 3.5–5.2)
ALK PHOS: 69 U/L (ref 39–117)
ALT: 21 U/L (ref 0–35)
AST: 25 U/L (ref 0–37)
BILIRUBIN TOTAL: 0.5 mg/dL (ref 0.2–1.2)
BUN: 15 mg/dL (ref 6–23)
CO2: 33 mEq/L — ABNORMAL HIGH (ref 19–32)
Calcium: 9.3 mg/dL (ref 8.4–10.5)
Chloride: 99 mEq/L (ref 96–112)
Creatinine, Ser: 0.87 mg/dL (ref 0.40–1.20)
GFR: 67.85 mL/min (ref >60.00)
Glucose, Bld: 212 mg/dL — ABNORMAL HIGH (ref 70–99)
POTASSIUM: 4.5 meq/L (ref 3.5–5.1)
Sodium: 135 mEq/L (ref 135–145)
TOTAL PROTEIN: 6.6 g/dL (ref 6.0–8.3)

## 2017-06-13 ENCOUNTER — Ambulatory Visit (INDEPENDENT_AMBULATORY_CARE_PROVIDER_SITE_OTHER): Payer: PPO | Admitting: Endocrinology

## 2017-06-13 VITALS — BP 136/66 | HR 71 | Ht 64.0 in | Wt 162.0 lb

## 2017-06-13 DIAGNOSIS — E1065 Type 1 diabetes mellitus with hyperglycemia: Secondary | ICD-10-CM

## 2017-06-13 DIAGNOSIS — E782 Mixed hyperlipidemia: Secondary | ICD-10-CM | POA: Diagnosis not present

## 2017-06-13 DIAGNOSIS — E063 Autoimmune thyroiditis: Secondary | ICD-10-CM

## 2017-06-13 NOTE — Patient Instructions (Signed)
Basal of 1.15 to start at 7 am

## 2017-06-13 NOTE — Progress Notes (Signed)
Patient ID: Gina [name], female   DOB: 1946/03/09, 71 y.o.   MRN: [id number]   Reason for Appointment: Insulin Pump followup:   History of Present Illness   Diagnosis: Type 1 DIABETES MELITUS, date of diagnosis:  1984     CURRENT insulin pump:  One Touch Ping, has been on a pump since 1995  HISTORY:    The pump SETTINGS are: Midnight = 0.45, 4 AM 0.95.  6 AM = 1.025, 8:30 AM = 1.15, 11:30 AM = 1.1;, 4PM = 0.95 and 6 PM = 0.8   Total insulin dose about  30 units per day with 66 % in basal Carbohydrate ratio 1:10 before 12 noon, 1:7 for lunch and 1: 8 After 5 PM Blood sugar target 110 .  Correction factor 1: 30   A1c is now fairly stable at 6.6, previously range 6.3- 6.7  Current management, blood sugar patterns and problems identified:    She is now starting to use the freestyle Libre sensor and she finds this much more useful than finger sticks  Recently not comparing her readings to her fingersticks and using the data for her pump management  Although her blood sugar is averaging only 123 on her pump recently her A1c is higher than expected  FASTING readings are fairly good with only occasional overnight low blood sugars on the sensor  HYPOGLYCEMIA: She was having very frequent hypoglycemia.  Before her last visit but this is less now since she is less active.  She has only minimal symptoms of nocturnal hypoglycemia even when the sensor is treating low, she thinks that she will feel hypoglycemic with blood sugars at 60 or below  On her freestyle since she had a reading of 48 last Monday in the morning but she was not very symptomatic and did not confirm readings with her fingerstick  TARGET is 110 even though she was told to change is 220 on the last visit  However she did change her SENSITIVITY to be 1: 40 instead of 1:30     POSTPRANDIAL readings are excellent most of the time after lunch and dinner except sometimes in the evenings  Blood sugars are  mostly HIGH in the mornings even though she thinks she is not eating carbohydrates with her eggs and [place] sausage.  However she did not realize that she is drinking honey and vinegar at that time along with coffee  As usual she may occasionally forget to boluses sometimes while eating especially in the evening  Highest blood sugar was about 250 when she went to a party in the evening and did not bolus adequately or for eating  Also recently has not been active and is gaining weight    GLUCOSE CONTROL with the pump is assessed today by pump and freestyle [employer]  Blood sugar data from freestyle [place] download:  PRE-MEAL Fasting Lunch Dinner Bedtime Overall  Glucose range: 48-?        Mean/median: 115  101  126  111  123+/-52    POST-MEAL PC Breakfast PC Lunch PC Dinner  Glucose range:     Mean/median: 177  119  129      EXERCISE: Not much lately because of fatigue  Wt Readings from Last 3 Encounters:  06/13/17 162 lb (73.5 kg)  03/04/17 153 lb 6.4 oz (69.6 kg)  11/29/16 152 lb (68.9 kg)    LABS:   Lab Results  Component Value Date   HGBA1C 6.6 (H) 06/06/2017  HGBA1C 6.5 02/26/2017   HGBA1C 6.7 (H) 11/26/2016   Lab Results  Component Value Date   MICROALBUR 0.9 02/26/2017   LDLCALC 120 (H) 02/26/2017   CREATININE 0.87 06/06/2017    Other problems discussed today: Review of systems  No visits with results within 1 Week(s) from this visit.  Latest known visit with results is:  Lab on 06/06/2017  Component Date Value Ref Range Status  . Hgb A1c MFr Bld 06/06/2017 6.6* 4.6 - 6.5 % Final   Glycemic Control Guidelines for People with Diabetes:Non Diabetic:  <6%Goal of Therapy: <7%Additional Action Suggested:  >8%   . Sodium 06/06/2017 135  135 - 145 mEq/L Final  . Potassium 06/06/2017 4.5  3.5 - 5.1 mEq/L Final  . Chloride 06/06/2017 99  96 - 112 mEq/L Final  . CO2 06/06/2017 33* 19 - 32 mEq/L Final  . Glucose, Bld 06/06/2017 212* 70 - 99 mg/dL Final  .  BUN 06/06/2017 15  6 - 23 mg/dL Final  . Creatinine, Ser 06/06/2017 0.87  0.40 - 1.20 mg/dL Final  . Total Bilirubin 06/06/2017 0.5  0.2 - 1.2 mg/dL Final  . Alkaline Phosphatase 06/06/2017 69  39 - 117 U/L Final  . AST 06/06/2017 25  0 - 37 U/L Final  . ALT 06/06/2017 21  0 - 35 U/L Final  . Total Protein 06/06/2017 6.6  6.0 - 8.3 g/dL Final  . Albumin 06/06/2017 3.9  3.5 - 5.2 g/dL Final  . Calcium 06/06/2017 9.3  8.4 - 10.5 mg/dL Final  . GFR 06/06/2017 67.85  >60.00 mL/min Final     Allergies as of 06/13/2017      Reactions   Statins Other (See Comments)   Leg pain & severe fatigue      Medication List       Accurate as of 06/13/17  4:17 PM. Always use your most recent med list.          Cod Liver Oil 1000 MG Caps Take 2,000 mg by mouth daily.   CoQ10 200 MG Caps Take 200 mg by mouth daily.   ELIQUIS 5 MG Tabs tablet Generic drug:  apixaban TAKE 1 TABLET BY MOUTH TWICE A DAY   FREESTYLE LIBRE READER Devi 1 Device by Does not apply route as directed.   FREESTYLE LIBRE SENSOR SYSTEM Misc 1 strip by Does not apply route once a week. Apply to upper arm and change sensor every 10 days   glucosamine-chondroitin 500-400 MG tablet Take 2 tablets by mouth daily.   glucose blood test strip Commonly known as:  ONE TOUCH ULTRA TEST Use asto check blood sugar 5 times a day   insulin aspart 100 UNIT/ML injection Commonly known as:  NOVOLOG BASAL RATE 0.9 7AM-12, 0.8 NOON-7 AM. MAX 60 UNITS SUBCUTANEOUSLY EVERY DAY WITH PUMP PER MD   losartan 25 MG tablet Commonly known as:  COZAAR Take 25 mg by mouth daily.   Magnesium 400 MG Caps Take 400 mg by mouth 2 (two) times daily.   Menaquinone-7 100 MCG Caps Take 150 mcg by mouth daily.   NALTREXONE HCL PO Take 4.5 mg by mouth daily.   NON FORMULARY Systemic Formulas Pancreas supplement, 1 per day "                    "           Metabo-Shake (Glycemic Support) 1 scoop per day Reishi w/ Curcumin 1 per day   NON  FORMULARY Systemic  Formulas Glycemic Balance, 2 per day "                  "             Eyes supplement, 2 per day "                  "              ROX (Antioxidant w/ Reservatrol), 1 per day   PROGESTERONE EX Apply 1 ml to skin daily   Red Yeast Rice 600 MG Caps Take by mouth. Takes 2 capsules daily   RESVERATROL PO Take 250 mg by mouth daily.   SUPER OMEGA-3 PO Take by mouth. Takes 2 capsules per day   thyroid 15 MG tablet Commonly known as:  ARMOUR Take 15 mg by mouth daily.   thyroid 30 MG tablet Commonly known as:  ARMOUR Take 30 mg by mouth daily before breakfast.   Turmeric Curcumin 500 MG Caps Take 1 tablet by mouth 3 (three) times daily.   Vitamin B-12 5000 MCG Subl Place 5,000 mcg under the tongue daily.   vitamin C 1000 MG tablet Take 1,000 mg by mouth daily.   VITAMIN D PO Take 5,000 mcg by mouth daily.       Allergies:  Allergies  Allergen Reactions  . Statins Other (See Comments)    Leg pain & severe fatigue    Past Medical History:  Diagnosis Date  . Anxiety    new dx  . Atrial fibrillation ([place])    a. 03/2015 s/p RFCA;  b. CHA2DS2VASc = 5-->Eliquis.  . 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 Diabetes mellitus type 1 (HCC)    Insulin pump  . Diastolic CHF ([place])    a. 10/8561 - due to AF RVR;  b. 03/2015 Echo: EF 60-65%, no rwma.  . Hyperlipidemia   . Hypertension   . Hypothyroidism   . Insulin pump in place   . Ovarian cyst   . S/P radiation therapy 04/10/12- 05/26/2012   left Breast and Axilla / 50 gy / 25 Fractions with Left Breast Boost / 10 Gy / 5 Fractions  . SIADH (syndrome of inappropriate ADH production) ([place])   . Use of letrozole ([place]) 06/13/12    Past Surgical History:  Procedure Laterality Date  . APPENDECTOMY    . 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 COLONOSCOPY     1 polyp   . ELECTROPHYSIOLOGIC STUDY N/A 04/08/2015   PVI + CTI ablation + posterior Box lesion + Atach ablation by [name]  . OVARIAN CYST SURGERY    . TEE WITHOUT CARDIOVERSION N/A 04/07/2015   Procedure: TRANSESOPHAGEAL ECHOCARDIOGRAM (TEE);  Surgeon: Thayer Headings, MD;  Location: [facility] ENDOSCOPY;  Service: Cardiovascular;  Laterality: N/A;  . TONSILLECTOMY      Family History  Problem Relation Age of Onset  . Breast cancer Mother 65  . Heart failure Mother   . Prostate cancer Maternal Uncle        died in his 46s  . Heart attack Father   . Diabetes Maternal Uncle 15    Social History:  reports that she has never smoked. She has never used smokeless tobacco. She reports that she  does not drink alcohol or use drugs.  REVIEW of systems:   She has a history of mild hypothyroidism followed by a New  holistic physician Again she is getting a regimen of '45mg'$  Armour Thyroid  TSH normal Previously but no recent follow-up available   She is complaining of persistent fatigue and not clear of the reason  Lab Results  Component Value Date   TSH 0.88 02/26/2017    HYPERCHOLESTEROLEMIA: Her LDL has been persistently high and refuses to take a statin drug including pravastatin even though it had no side effects previously.  She was previously trying to use red rice yeast , none now Previously her LDL particle number is consistently high at 1621  She was given a prescription for Crestor which she thinks she tried for 3-4 weeks but could not continue because she felt it was giving her headaches and making her tired  She Continues to be using natural remedies and essential oils for her hypercholesterolemia She said that she is applying a concoction of several oils on her abdominal skin  Her LDL is relatively betterIn 5/18 , previously as high as 194  Lab Results  Component Value  Date   CHOL 193 02/26/2017   CHOL 215 (H) 11/26/2016   CHOL 278 (H) 08/15/2016   Lab Results  Component Value Date   HDL 63.40 02/26/2017   HDL 67.30 11/26/2016   HDL 69 08/15/2016   Lab Results  Component Value Date   LDLCALC 120 (H) 02/26/2017   LDLCALC 137 (H) 11/26/2016   LDLCALC 194 (H) 08/15/2016   Lab Results  Component Value Date   TRIG 46.0 02/26/2017   TRIG 52.0 11/26/2016   TRIG 76 08/15/2016   Lab Results  Component Value Date   CHOLHDL 3 02/26/2017   CHOLHDL 3 11/26/2016   CHOLHDL 4.0 08/15/2016   Lab Results  Component Value Date   LDLDIRECT 148.4 08/06/2013   . She takes numerous herbal supplements   EXAM:  BP 136/66   Pulse 71   Ht '5\' 4"'$  (1.626 m)   Wt 162 lb (73.5 kg)   SpO2 98%   BMI 27.81 kg/m    ASSESSMENT:  DIABETES:  See history of present illness for detailed discussion of current management, blood sugar patterns and problems identified  Her A1c is still fairly good at 6.5  As above she has used the freestyle Terry sensor which has helped her get better pattern of her blood sugars However this appears to be reading relatively lower than actual readings sometimes especially in the low range  Most significant abnormal pattern is hyperglycemia in the mornings related to [facility] phenomenon, not covering her relatively higher fat breakfast or having coffee and honey She probably needs a higher basal rate as blood sugars are mostly high within 7-10 AM  Postprandial readings otherwise are fair with some high readings in the evenings when she is either not covering of her meals or being late for boluses   RECOMMENDATIONS:  BASAL rate changes: 7 AM-8.30 AM =  1.150 Take extra unit at least for her morning breakfast and coffee/honey drinks She will call if she has difficulty with postprandial hyperglycemia again Although she is not hypoglycemic consistently she will let us know if she is having more persistence no readings overnight She  needs to check her blood sugars with fingerstick when she is having unusually readings on the freestyle sensor More consistent bolusing before eating, may do split boluses if needed Exercise as tolerated  FATIGUE: She needs to follow-up with PCP for further evaluation  HYPERTENSION:  well controlled   HYPERLIPIDEMIA: Will recheck on next visit, currently not on statin drugs which she is refusing to take    HYPOTHYROIDISM: Adequately replaced  previously and will need follow-up levels, no records available from her holistic physician  Counseling time on subjects discussed above is over 50% of today's 25 minute visit  Keishawn Rajewski 06/13/2017, 4:17 PM

## 2017-06-18 ENCOUNTER — Telehealth: Payer: Self-pay | Admitting: Endocrinology

## 2017-06-19 ENCOUNTER — Other Ambulatory Visit: Payer: Self-pay

## 2017-06-19 ENCOUNTER — Telehealth: Payer: Self-pay | Admitting: Endocrinology

## 2017-06-19 ENCOUNTER — Other Ambulatory Visit: Payer: Self-pay | Admitting: Pharmacist

## 2017-06-19 MED ORDER — FREESTYLE LIBRE SENSOR SYSTEM MISC: 1.0000 | 3 refills | Status: DC

## 2017-06-19 NOTE — Telephone Encounter (Signed)
Called patient and she stated that she was not able to get her [place] covered from [employer]. I have resent her prescription to [facility] with the diagnosis code on it.

## 2017-06-19 NOTE — Telephone Encounter (Signed)
Patient called in reference to Rx for Continuous Blood Gluc Sensor ([place]) MISC needing to be resubmitted to fit medicare guidelines. Please call patient with any questions. Cell number [phone]. OK to leave message.

## 2017-06-19 NOTE — Patient Outreach (Signed)
Receive a voicemail from Silas Flood stating that she has met the out-of-pocket requirement for the patient assistance programs for Novolog and Eliquis. Called and spoke with patient. HIPAA identifiers verified and verbal consent received.  Patient confirms that she has met the limits. Review with patient the application requirements for the patient assistance programs for Novolog through [employer] and for [employer] through [employer]. Let Ms. Viernes know that pharmacy will send these applications to her in the mail as well as contact her providers to have the provider portions completed.  Patient reports no further medication questions at this time. Patient confirms that she has my phone number saved.  PLAN  1) Will send a request to Pharmacy Technician [place] asking her to send the patient the applications for the patient assistance programs for Novolog through [employer] and for [employer] through [employer]. Will also ask that she reach out to the respective provider offices to request that they complete the healthcare provider portions of the paperwork.   2) Will follow with Crystal as she works with Ms. Parodi to complete these application processes.   Harlow Asa, PharmD, [place] [facility] [phone]

## 2017-06-21 ENCOUNTER — Ambulatory Visit: Payer: Self-pay | Admitting: Pharmacist

## 2017-07-02 ENCOUNTER — Telehealth: Payer: Self-pay

## 2017-07-02 ENCOUNTER — Other Ambulatory Visit: Payer: Self-pay

## 2017-07-02 MED ORDER — APIXABAN 5 MG PO TABS: 5.0000 mg | ORAL_TABLET | Freq: Two times a day (BID) | ORAL | 3 refills | Status: DC

## 2017-07-02 NOTE — Telephone Encounter (Signed)
**Note De-Identified Shallen Luedke Obfuscation** I received a [place] Patient Assistance application for Eliquis from [employer] at [employer].  I have completed application and placed it along with an Eliquis RX in Dr [employer] mail bin awaiting his signature on both. He will be back in the office on Friday 9/21.  I will fax all back to [place] once complete to [phone].

## 2017-07-03 ENCOUNTER — Other Ambulatory Visit: Payer: Self-pay | Admitting: Pharmacist

## 2017-07-03 NOTE — Patient Outreach (Signed)
Call to follow up with Ms. Kingsbury regarding medication assistance. HIPAA identifiers verified and verbal consent received.  Patient reports that she did not receive a letter in the mail from [facility] with the applications for the patient assistance programs for Novolog through [employer] and for Eliquis through [employer]. Patient asks that this paperwork be resent. Again verify with patient the mailing address in [place]. Patient confirms that this is correct.  PLAN:  1) Will send a request to Pharmacy Technician [place] asking her to send the patient the applications for the patient assistance programs for [employer] through [employer] and for [employer] through [employer] again.  2) Will continue to follow with Crystal as she works with Ms. Lineman to complete these application processes.   Harlow Asa, PharmD, [place] [facility] [phone]

## 2017-07-04 DIAGNOSIS — E109 Type 1 diabetes mellitus without complications: Secondary | ICD-10-CM | POA: Diagnosis not present

## 2017-07-08 ENCOUNTER — Other Ambulatory Visit: Payer: Self-pay | Admitting: Pharmacy Technician

## 2017-07-08 NOTE — Patient Outreach (Signed)
[place] [facility]) Care Management  07/08/2017  Fancy Dunkley Jan 31, 1946 [id number]  I contacted Ms. Mahan to follow-up on patient assistance application's that I mailed to her last week for [employer] and [employer]. The patient confirmed that she received the application's on Saturday but has not had time to look them over. I informed the patient that if she needs any assistance or has any questions to contact me directly.  Doreene Burke, [place] [phone]

## 2017-07-08 NOTE — Telephone Encounter (Signed)
[name] has signed the BMS pt assistance application and RX for Eliquis. I have faxed all to [facility] ATTN: Doreene Burke. I did receive confirmation that the fax was successful.

## 2017-07-15 ENCOUNTER — Telehealth: Payer: Self-pay

## 2017-07-15 NOTE — Telephone Encounter (Signed)
Faxed completed [employer] Patient Assistance Program Application to [employer] at [phone] on 07/15/2017 for patient.

## 2017-07-17 NOTE — Telephone Encounter (Signed)
Called Gina [name] from [facility] at [phone] and left a voice message to let her know that I have faxed the paperwork again to both fax numbers: 1) [phone] 2) [phone]  Hopefully she will receive this time but if not to please call back and let us know.

## 2017-07-17 NOTE — Telephone Encounter (Signed)
Crystal from [facility] called and is looking for the Patient Media planner for [employer] for [employer].  She has NOT received even though chart shows the ppw was faxed on 07/15/2017.  Please call her at [phone]

## 2017-07-22 ENCOUNTER — Telehealth: Payer: Self-pay | Admitting: Internal Medicine

## 2017-07-22 NOTE — Telephone Encounter (Signed)
New message     Does pt need to come in yearly for check up or every 2 years?

## 2017-07-24 ENCOUNTER — Other Ambulatory Visit: Payer: Self-pay | Admitting: Pharmacy Technician

## 2017-07-24 ENCOUNTER — Telehealth: Payer: Self-pay

## 2017-07-24 ENCOUNTER — Other Ambulatory Visit: Payer: Self-pay

## 2017-07-24 MED ORDER — APIXABAN 5 MG PO TABS: 5.0000 mg | ORAL_TABLET | Freq: Two times a day (BID) | ORAL | 3 refills | Status: DC

## 2017-07-24 NOTE — Telephone Encounter (Signed)
Patient requesting a call back because she is about to change to medtronic and would like to get some advice- cell is [phone]

## 2017-07-24 NOTE — Telephone Encounter (Signed)
Called patient and she stated that she wants to change over to the new Medtronic insulin pump and I let her speak to Simpson from [employer] and this will be processed for Gina [name] and we will try to get her new pump!   She has several questions and we will help her the best that we can!

## 2017-07-24 NOTE — Telephone Encounter (Signed)
Spoke with patient and she is doing good.  She says she totally forgot to see [name] in May.  There is a recall in the computer for her to see afib clinic.  I let her know [name] would be calling her to schedule an appointment with [name].

## 2017-07-24 NOTE — Telephone Encounter (Signed)
**Note De-Identified Gina [name] Obfuscation** Received a call and a fax from Doreene Burke from [facility] asking me to have [name] resign BMS application and RX for Eliquis. I have placed the BMS application and RX in Dr [employer] mail bin awaiting his signature. He will return to the office on Monday 10/15.

## 2017-07-24 NOTE — Patient Outreach (Signed)
[facility]) Care Management  07/24/2017  Tona Qualley [birth year] [id number]  I called to follow-up with Ms. Laursen in reference to patient assistance application's that we're currently working on. HIPAA identifiers were verified and verbal consent received.   Patient has been approved for Novolog through 09/13/2017 and they will ship it to Dr. Ronnie Derby office in 7-10 business days. I have instructed the patient to follow-up with the office if she has not heard from them by the end of next week.   The application for Eliquis is still being worked on due to an Engineer, production being submitted initially. The patient is aware and I have scheduled to go to her home tomorrow to obtain her signature and date for the new application. I also called Dr. Jackalyn Lombard office to notify the staff that they would receive the new application which I filled out completely and it only requires physician's  signature and date.   In the process the patient called back about her insulin pump and had question's about whether or not Medronic 670g would be covered due to [place] moving out of the [place]. I placed a call to [facility] and found out as long as her provider is in network which he is that it will be covered 100% by her insurance. I have called the patient back to inform her of the information.    PLAN:   1. I will go to the patient's home tomorrow at 1:30 to have her sign the new application for Eliquis. After I receive the provider's portion of the application I will fax it to [place].  2. I will follow-up with [place] in 2-3 business days after submitting completed application and follow-up with patient to let her know of the status.  Doreene Burke, [place] [phone]

## 2017-07-25 ENCOUNTER — Telehealth: Payer: Self-pay

## 2017-07-25 NOTE — Telephone Encounter (Signed)
Patient assistance for [employer] has been approved and the patient has been notified of this

## 2017-07-26 ENCOUNTER — Telehealth: Payer: Self-pay

## 2017-07-26 NOTE — Telephone Encounter (Signed)
Patient assistance paperwork for Eliquis completed and faxed to [place] at [facility], as requested.

## 2017-07-31 ENCOUNTER — Telehealth: Payer: Self-pay

## 2017-07-31 NOTE — Telephone Encounter (Signed)
Called patient on her home number and let her know that I have her Novolog insulin that was sent through the patient assistance program and she can pick it up at the front desk of our office.

## 2017-08-01 ENCOUNTER — Other Ambulatory Visit: Payer: Self-pay | Admitting: Pharmacy Technician

## 2017-08-01 ENCOUNTER — Telehealth: Payer: Self-pay | Admitting: *Deleted

## 2017-08-01 NOTE — Telephone Encounter (Signed)
[place] patient assistance form in Dr. Bonita Quin box for signature, this application is for renewal 2019.  I have mailed patient her part to complete.

## 2017-08-01 NOTE — Patient Outreach (Signed)
[place] [facility]) Care Management  08/01/2017  Gina [name] Nov 28, 1945 [id number]   Contacted [name] today in reference to [place] patient assistance application for Eliquis. HIPAA identifiers verified and verbal consent received.  I informed the patient that she has been approved for the program through 10/14/2017. I proved her the number that she needs to call to initiate the first fill which is [phone]. Once the patient contacts the manufacturer the medication should arrive in 7-10 business days.  PLAN:  1. Follow up with the patient within 2 weeks to ensure that she has received the medication.  Doreene Burke, [place] [phone]

## 2017-08-01 NOTE — Telephone Encounter (Signed)
Received fax stating patients Gina [name] was approved through [facility]. The medication will be free of charge from 07/31/2017-10/14/2017. I called and informed patient.  I also received new application for patients ELIQUIS for the year 2019, I have informed patient I will mail this to her.

## 2017-08-05 DIAGNOSIS — E109 Type 1 diabetes mellitus without complications: Secondary | ICD-10-CM | POA: Diagnosis not present

## 2017-08-07 ENCOUNTER — Other Ambulatory Visit: Payer: Self-pay | Admitting: Internal Medicine

## 2017-08-08 NOTE — Telephone Encounter (Signed)
error 

## 2017-08-09 DIAGNOSIS — E109 Type 1 diabetes mellitus without complications: Secondary | ICD-10-CM | POA: Diagnosis not present

## 2017-08-12 ENCOUNTER — Encounter: Payer: Self-pay | Admitting: *Deleted

## 2017-08-14 ENCOUNTER — Other Ambulatory Visit: Payer: Self-pay | Admitting: Pharmacy Technician

## 2017-08-14 NOTE — Patient Outreach (Signed)
[place] [facility]) Care Management  08/14/2017  Kareen Jefferys [birth year] [id number]  I returned a call to Mrs. Boak yesterday in reference to an application that she received in the mail for patient assistance for Eliquis. HIPAA identifiers verified and verbal consent received. have already submitted an application earlier this month and the medication was approved. While on the phone I inquired whether or not she had received the medication as of yet. Since she had not I followed up with [place] and found out the patient forgot to call the number I provided her to verify information for the delivery of the medication. I called the patient back and provided the number to her again.  Today I followed-up again to make sure that she called [place] yesterday. The patient did call and should receive the medication tomorrow.  PLAN:  I will call the patient again tomorrow to make sure the medication was received.  Doreene Burke, [place] [phone]

## 2017-08-19 ENCOUNTER — Other Ambulatory Visit: Payer: Self-pay | Admitting: Pharmacy Technician

## 2017-08-19 ENCOUNTER — Other Ambulatory Visit: Payer: Self-pay | Admitting: Pharmacist

## 2017-08-19 DIAGNOSIS — E109 Type 1 diabetes mellitus without complications: Secondary | ICD-10-CM | POA: Diagnosis not present

## 2017-08-19 DIAGNOSIS — M65311 Trigger thumb, right thumb: Secondary | ICD-10-CM | POA: Diagnosis not present

## 2017-08-19 DIAGNOSIS — I10 Essential (primary) hypertension: Secondary | ICD-10-CM | POA: Diagnosis not present

## 2017-08-19 DIAGNOSIS — E034 Atrophy of thyroid (acquired): Secondary | ICD-10-CM | POA: Diagnosis not present

## 2017-08-19 DIAGNOSIS — E7800 Pure hypercholesterolemia, unspecified: Secondary | ICD-10-CM | POA: Diagnosis not present

## 2017-08-19 NOTE — Patient Outreach (Signed)
[facility]) Care Management  08/19/2017  Gina [name] 05-21-1946 [id number]  Followed up with patient in reference to previous call on 10/31 about delivery of Eliquis. HIPAA identifiers verified and verbal consent received. The patient has received the medication which was approved through [employer] patient assistance program. I will route a message to Grayland Ormond, Rph to close [name] out.  Doreene Burke, [place] [phone]

## 2017-08-19 NOTE — Patient Outreach (Signed)
Receive notification from Doreene Burke that patient has now received Eliquis through the [employer] patient assistance program. Will close patient to Care Management at this time.  Harlow Asa, PharmD, [place] [facility] [phone]

## 2017-08-26 ENCOUNTER — Encounter: Payer: Self-pay | Admitting: Internal Medicine

## 2017-08-26 ENCOUNTER — Ambulatory Visit: Payer: PPO | Admitting: Internal Medicine

## 2017-08-26 VITALS — BP 124/82 | HR 64 | Ht 64.0 in | Wt 159.8 lb

## 2017-08-26 DIAGNOSIS — R0683 Snoring: Secondary | ICD-10-CM | POA: Diagnosis not present

## 2017-08-26 DIAGNOSIS — I10 Essential (primary) hypertension: Secondary | ICD-10-CM

## 2017-08-26 DIAGNOSIS — I480 Paroxysmal atrial fibrillation: Secondary | ICD-10-CM

## 2017-08-26 NOTE — Progress Notes (Signed)
PCP: Susy Frizzle, MD Primary Cardiologist: Dr Debara Pickett Primary EP: [name]  Gina [name] is a 71 y.o. female who presents today for routine electrophysiology followup.  Since last being seen in our clinic, the patient reports doing very well.  Today, she denies symptoms of palpitations, chest pain, shortness of breath,  lower extremity edema, dizziness, presyncope, or syncope.  The patient is otherwise without complaint today.   Past Medical History:  Diagnosis Date  . Anxiety    new dx  . Atrial fibrillation ([place])    a. 03/2015 s/p RFCA;  b. CHA2DS2VASc = 5-->Eliquis.  . 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 Diabetes mellitus type 1 (HCC)    Insulin pump  . Diastolic CHF ([place])    a. 06/9241 - due to AF RVR;  b. 03/2015 Echo: EF 60-65%, no rwma.  . Hyperlipidemia   . Hypertension   . Hypothyroidism   . Insulin pump in place   . Ovarian cyst   . S/P radiation therapy 04/10/12- 05/26/2012   left Breast and Axilla / 50 gy / 25 Fractions with Left Breast Boost / 10 Gy / 5 Fractions  . SIADH (syndrome of inappropriate ADH production) ([place])   . Use of letrozole ([facility]) 06/13/12   Past Surgical History:  Procedure Laterality Date  . APPENDECTOMY    . 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 COLONOSCOPY     1 polyp   . OVARIAN CYST SURGERY    . TONSILLECTOMY      ROS- all systems are reviewed and negatives except as per HPI above  Current Outpatient Medications  Medication Sig Dispense Refill  . Ascorbic Acid (VITAMIN C) 1000 MG tablet Take 1,000 mg by mouth daily.     . Cholecalciferol (VITAMIN D PO) Take 5,000 mcg by mouth daily.    Marland Kitchen Cod Liver Oil 1000 MG CAPS Take 2,000 mg by mouth daily.     . Coenzyme Q10  (COQ10) 200 MG CAPS Take 200 mg by mouth daily.    . Continuous Blood Gluc Receiver (FREESTYLE LIBRE READER) DEVI 1 Device by Does not apply route as directed. 1 Device 0  . Continuous Blood Gluc Sensor (FREESTYLE LIBRE SENSOR SYSTEM) MISC 1 strip by Does not apply route once a week. Apply to upper arm and change sensor every 10 days. Dx Code E10.9 9 each 3  . Cyanocobalamin (VITAMIN B-12) 5000 MCG SUBL Place 5,000 mcg under the tongue daily.    Marland Kitchen ELIQUIS 5 MG TABS tablet TAKE 1 TABLET BY MOUTH TWICE A DAY 60 tablet 10  . glucosamine-chondroitin 500-400 MG tablet Take 1 tablet daily by mouth.     Marland Kitchen glucose blood (ONE TOUCH ULTRA TEST) test strip Use asto check blood sugar 5 times a day 200 each 5  . GLUTATHIONE PO Take 15 mLs daily by mouth.    . insulin aspart (NOVOLOG) 100 UNIT/ML injection BASAL RATE 0.9 7AM-12, 0.8 NOON-7 AM. MAX 60 UNITS SUBCUTANEOUSLY EVERY DAY WITH PUMP PER MD 20 mL 3  . losartan (COZAAR) 25 MG tablet Take 25 mg by mouth daily.    . Menaquinone-7 100 MCG CAPS Take 150 mcg by mouth daily.    . Misc Natural Products (PROGESTERONE EX) Apply 1 ml to skin  daily    . NALTREXONE HCL PO Take 4.5 mg by mouth daily.    . NON FORMULARY Systemic Formulas Pancreas supplement, 1 per day "                    "           Metabo-Shake (Glycemic Support) 1 scoop per day Reishi w/ Curcumin 1 per day    . NON FORMULARY Systemic Formulas Glycemic Balance, 2 per day "                  "             Eyes supplement, 2 per day "                  "              ROX (Antioxidant w/ Reservatrol), 1 per day    . NON FORMULARY Take 15 mLs daily by mouth. MAG CAL VANILLA LIQUID    . NON FORMULARY Take 1 tablet 2 (two) times daily by mouth. BioFunction "I"    . NON FORMULARY Take 1 tablet daily by mouth. Reishi w/ Curcuma    . Omega-3 Fatty Acids (SUPER OMEGA-3 PO) Take by mouth. Takes 2 capsules per day    . RESVERATROL PO Take 250 mg by mouth daily.     Marland Kitchen thyroid (ARMOUR) 15 MG tablet Take 15 mg  by mouth daily.    Marland Kitchen thyroid (ARMOUR) 30 MG tablet Take 30 mg by mouth daily before breakfast.     . Turmeric Curcumin 500 MG CAPS Take 1 tablet by mouth 3 (three) times daily.     No current facility-administered medications for this visit.    Facility-Administered Medications Ordered in Other Visits  Medication Dose Route Frequency Provider Last Rate Last Dose  . topical emolient (BIAFINE) emulsion   Topical Daily Eppie Gibson, MD        Physical Exam: Vitals:   08/26/17 1037  BP: 124/82  Pulse: 64  SpO2: 99%  Weight: 159 lb 12.8 oz (72.5 kg)  Height: '5\' 4"'$  (1.626 m)    GEN- The patient is well appearing, alert and oriented x 3 today.   Head- normocephalic, atraumatic Eyes-  Sclera clear, conjunctiva pink Ears- hearing intact Oropharynx- clear Lungs- Clear to ausculation bilaterally, normal work of breathing Heart- Regular rate and rhythm, no murmurs, rubs or gallops, PMI not laterally displaced GI- soft, NT, ND, + BS Extremities- no clubbing, cyanosis, or edema  EKG tracing ordered today is personally reviewed and shows sinus rhythm, normal ekg  Assessment and Plan:  1. Afib/ atach/ atrial flutter Doing well s/p ablation off AAD therapy chads2vasc score is 5.  Continue long term anticoagulation  2. HTN Stable No change required today  3. Snoring Refer to Dr Ron Parker for sleep study, given fatigue and snoring  Follow-up with [name] in AF clinic in a year  Thompson Grayer MD, [facility] 08/26/2017 10:55 AM

## 2017-08-26 NOTE — Patient Instructions (Addendum)
Medication Instructions:  Your physician recommends that you continue on your current medications as directed. Please refer to the Current Medication list given to you today.  -- If you need a refill on your cardiac medications before your next appointment, please call your pharmacy. --  Labwork: None ordered  Testing/Procedures: None ordered  Follow-Up: Your physician wants you to follow-up in: 1 year with Ceasar Lund.  You will receive a reminder letter in the mail two months in advance. If you don't receive a letter, please call our office to schedule the follow-up appointment.  You have been referred to Dr. Ron Parker  for snoring and Afib   Thank you for choosing [facility]!!   Frederik Schmidt, RN [phone]  Any Other Special Instructions Will Be Listed Below (If Applicable).

## 2017-09-06 DIAGNOSIS — E109 Type 1 diabetes mellitus without complications: Secondary | ICD-10-CM | POA: Diagnosis not present

## 2017-09-13 ENCOUNTER — Other Ambulatory Visit (INDEPENDENT_AMBULATORY_CARE_PROVIDER_SITE_OTHER): Payer: PPO

## 2017-09-13 DIAGNOSIS — E1065 Type 1 diabetes mellitus with hyperglycemia: Secondary | ICD-10-CM | POA: Diagnosis not present

## 2017-09-13 DIAGNOSIS — E063 Autoimmune thyroiditis: Secondary | ICD-10-CM | POA: Diagnosis not present

## 2017-09-13 DIAGNOSIS — E782 Mixed hyperlipidemia: Secondary | ICD-10-CM | POA: Diagnosis not present

## 2017-09-13 LAB — COMPREHENSIVE METABOLIC PANEL
ALT: 19 U/L (ref 0–35)
AST: 25 U/L (ref 0–37)
Albumin: 3.9 g/dL (ref 3.5–5.2)
Alkaline Phosphatase: 73 U/L (ref 39–117)
BILIRUBIN TOTAL: 0.6 mg/dL (ref 0.2–1.2)
BUN: 13 mg/dL (ref 6–23)
CALCIUM: 9.4 mg/dL (ref 8.4–10.5)
CHLORIDE: 99 meq/L (ref 96–112)
CO2: 31 meq/L (ref 19–32)
CREATININE: 0.77 mg/dL (ref 0.40–1.20)
GFR: 78.05 mL/min (ref >60.00)
GLUCOSE: 97 mg/dL (ref 70–99)
Potassium: 4.3 mEq/L (ref 3.5–5.1)
SODIUM: 136 meq/L (ref 135–145)
Total Protein: 6.7 g/dL (ref 6.0–8.3)

## 2017-09-13 LAB — LIPID PANEL
CHOL/HDL RATIO: 4
CHOLESTEROL: 254 mg/dL — AB (ref 0–200)
HDL: 60.4 mg/dL (ref >39.00)
LDL Cholesterol: 178 mg/dL — ABNORMAL HIGH (ref 0–99)
NonHDL: 193.79
TRIGLYCERIDES: 77 mg/dL (ref 0.0–149.0)
VLDL: 15.4 mg/dL (ref 0.0–40.0)

## 2017-09-13 LAB — TSH: TSH: 2.62 u[IU]/mL (ref 0.35–4.50)

## 2017-09-13 LAB — T3, FREE: T3, Free: 3.1 pg/mL (ref 2.3–4.2)

## 2017-09-13 LAB — T4, FREE: Free T4: 0.89 ng/dL (ref 0.60–1.60)

## 2017-09-13 LAB — HEMOGLOBIN A1C: Hgb A1c MFr Bld: 6.5 % (ref 4.6–6.5)

## 2017-09-16 NOTE — Progress Notes (Signed)
Patient ID: Gina [name], female   DOB: [birth year], 71 y.o.   MRN: [id number]   Reason for Appointment: Insulin Pump followup:   History of Present Illness   Diagnosis: Type 1 DIABETES MELITUS, date of diagnosis:  1984     CURRENT insulin pump:  One Touch Ping, has been on a pump since 1995  HISTORY:    The pump SETTINGS are: Midnight = 0.45, 4 AM 0.95.  6 AM = 1.025, 8:30 AM = 1.15, 11:30 AM = 1.1;, 4PM = 0.95 and 6 PM = 0.8   Total insulin dose about  30 units per day with 66 % in basal  Carbohydrate ratio 1:10 before 12 noon, 1:7 for lunch and 1: 8 After 5 PM Blood sugar target 110 .  Correction factor 1: 30   A1c is now fairly stable at 6.5, previously range 6.3- 6.7  Current management, blood sugar patterns and problems identified:    She has occasionally been comparing her readings on the freestyle Libre to her fingersticks and she thinks they are about 10-20 milligrams lower than the fingersticks  However still using the data for her pump management  Although her blood sugar is averaging only 123 on her pump recently her A1c is higher than expected  FASTING readings are fairly good with only occasional overnight low blood sugars on the sensor  HYPOGLYCEMIA: She appears to be getting fairly low blood sugars through the night and she has not notified us or change her basal rate; on her last visit the 4 AM basal rate was supposed to be reduced but she did not do so  She has only minimal or no symptoms of nocturnal hypoglycemia even when the sensor is treating low which is happening frequently overnight  Usually will feel hypoglycemic with blood sugars at 60 or below  She has fairly consistent postprandial hyperglycemia after LUNCH although occasionally it may not go up as much, highest blood sugars maybe up to 351 sensor  She says that she is usually trying to boluses before eating but there are occasional boluses at about 3 PM when the blood sugar has gone  up  She is frequently not entering CARBOHYDRATES in her pump when she is bolusing  Also occasionally may have high readings after her evening meal  Her weight is down steadily even though she is not exercising   GLUCOSE CONTROL with the pump is assessed today by pump and freestyle [employer]  Blood sugar data from freestyle Agar download:  Mean values apply above for all meters except median for One Touch  PRE-MEAL Fasting Lunch Dinner 4-6 AM  Overall  Glucose range:  94-2 03       Mean/median: 116    60  113    POST-MEAL PC Breakfast PC Lunch PC Dinner  Glucose range:    1 67-275   Mean/median:  200  154      EXERCISE: Not much lately because of fatigue  Wt Readings from Last 3 Encounters:  09/17/17 157 lb 9.6 oz (71.5 kg)  08/26/17 159 lb 12.8 oz (72.5 kg)  06/13/17 162 lb (73.5 kg)    LABS:   Lab Results  Component Value Date   HGBA1C 6.5 09/13/2017   HGBA1C 6.6 (H) 06/06/2017   HGBA1C 6.5 02/26/2017   Lab Results  Component Value Date   MICROALBUR 0.9 02/26/2017   LDLCALC 178 (H) 09/13/2017   CREATININE 0.77 09/13/2017    Other problems discussed today: Review of  systems  Lab on 09/13/2017  Component Date Value Ref Range Status  . Cholesterol 09/13/2017 254* 0 - 200 mg/dL Final   ATP III Classification       Desirable:  < 200 mg/dL               Borderline High:  200 - 239 mg/dL          High:  > = 240 mg/dL  . Triglycerides 09/13/2017 77.0  0.0 - 149.0 mg/dL Final   Normal:  <150 mg/dLBorderline High:  150 - 199 mg/dL  . HDL 09/13/2017 60.40  >39.00 mg/dL Final  . VLDL 09/13/2017 15.4  0.0 - 40.0 mg/dL Final  . LDL Cholesterol 09/13/2017 178* 0 - 99 mg/dL Final  . Total CHOL/HDL Ratio 09/13/2017 4   Final                  Men          Women1/2 Average Risk     3.4          3.3Average Risk          5.0          4.42X Average Risk          9.6          7.13X Average Risk          15.0          11.0                      . NonHDL 09/13/2017 193.79    Final   NOTE:  Non-HDL goal should be 30 mg/dL higher than patient's LDL goal (i.e. LDL goal of < 70 mg/dL, would have non-HDL goal of < 100 mg/dL)  . T3, Free 09/13/2017 3.1  2.3 - 4.2 pg/mL Final  . Free T4 09/13/2017 0.89  0.60 - 1.60 ng/dL Final   Comment: Specimens from patients who are undergoing biotin therapy and /or ingesting biotin supplements may contain high levels of biotin.  The higher biotin concentration in these specimens interferes with this Free T4 assay.  Specimens that contain high levels  of biotin may cause false high results for this Free T4 assay.  Please interpret results in light of the total clinical presentation of the patient.    Marland Kitchen TSH 09/13/2017 2.62  0.35 - 4.50 uIU/mL Final  . Sodium 09/13/2017 136  135 - 145 mEq/L Final  . Potassium 09/13/2017 4.3  3.5 - 5.1 mEq/L Final  . Chloride 09/13/2017 99  96 - 112 mEq/L Final  . CO2 09/13/2017 31  19 - 32 mEq/L Final  . Glucose, Bld 09/13/2017 97  70 - 99 mg/dL Final  . BUN 09/13/2017 13  6 - 23 mg/dL Final  . Creatinine, Ser 09/13/2017 0.77  0.40 - 1.20 mg/dL Final  . Total Bilirubin 09/13/2017 0.6  0.2 - 1.2 mg/dL Final  . Alkaline Phosphatase 09/13/2017 73  39 - 117 U/L Final  . AST 09/13/2017 25  0 - 37 U/L Final  . ALT 09/13/2017 19  0 - 35 U/L Final  . Total Protein 09/13/2017 6.7  6.0 - 8.3 g/dL Final  . Albumin 09/13/2017 3.9  3.5 - 5.2 g/dL Final  . Calcium 09/13/2017 9.4  8.4 - 10.5 mg/dL Final  . GFR 09/13/2017 78.05  >60.00 mL/min Final  . Hgb A1c MFr Bld 09/13/2017 6.5  4.6 - 6.5 % Final   Glycemic Control Guidelines  for People with Diabetes:Non Diabetic:  <6%Goal of Therapy: <7%Additional Action Suggested:  >8%      Allergies as of 09/17/2017      Reactions   Statins Other (See Comments)   Leg pain & severe fatigue      Medication List        Accurate as of 09/17/17  9:58 AM. Always use your most recent med list.          Cod Liver Oil 1000 MG Caps Take 2,000 mg by mouth daily.   CoQ10  200 MG Caps Take 200 mg by mouth daily.   ELIQUIS 5 MG Tabs tablet Generic drug:  apixaban TAKE 1 TABLET BY MOUTH TWICE A DAY   FREESTYLE LIBRE READER Devi 1 Device by Does not apply route as directed.   FREESTYLE LIBRE SENSOR SYSTEM Misc 1 strip by Does not apply route once a week. Apply to upper arm and change sensor every 10 days. Dx Code E10.9   glucosamine-chondroitin 500-400 MG tablet Take 1 tablet daily by mouth.   glucose blood test strip Commonly known as:  ONE TOUCH ULTRA TEST Use asto check blood sugar 5 times a day   GLUTATHIONE PO Take 15 mLs daily by mouth.   insulin aspart 100 UNIT/ML injection Commonly known as:  NOVOLOG BASAL RATE 0.9 7AM-12, 0.8 NOON-7 AM. MAX 60 UNITS SUBCUTANEOUSLY EVERY DAY WITH PUMP PER MD   losartan 25 MG tablet Commonly known as:  COZAAR Take 25 mg by mouth daily.   Menaquinone-7 100 MCG Caps Take 150 mcg by mouth daily.   NALTREXONE HCL PO Take 4.5 mg by mouth daily.   NON FORMULARY Systemic Formulas Pancreas supplement, 1 per day "                    "           Metabo-Shake (Glycemic Support) 1 scoop per day Reishi w/ Curcumin 1 per day   NON FORMULARY Systemic Formulas Glycemic Balance, 2 per day "                  "             Eyes supplement, 2 per day "                  "              ROX (Antioxidant w/ Reservatrol), 1 per day   NON FORMULARY Take 15 mLs daily by mouth. MAG CAL VANILLA LIQUID   NON FORMULARY Take 1 tablet 2 (two) times daily by mouth. BioFunction "I"   NON FORMULARY Take 1 tablet daily by mouth. Reishi w/ Curcuma   PROGESTERONE EX Apply 1 ml to skin daily   RESVERATROL PO Take 250 mg by mouth daily.   SUPER OMEGA-3 PO Take by mouth. Takes 2 capsules per day   thyroid 15 MG tablet Commonly known as:  ARMOUR Take 15 mg by mouth daily.   thyroid 30 MG tablet Commonly known as:  ARMOUR Take 30 mg by mouth daily before breakfast.   Turmeric Curcumin 500 MG Caps Take 1 tablet by  mouth 3 (three) times daily.   Vitamin B-12 5000 MCG Subl Place 5,000 mcg under the tongue daily.   vitamin C 1000 MG tablet Take 1,000 mg by mouth daily.   VITAMIN D PO Take 5,000 mcg by mouth daily.       Allergies:  Allergies  Allergen  Reactions  . Statins Other (See Comments)    Leg pain & severe fatigue    Past Medical History:  Diagnosis Date  . Anxiety    new dx  . Atrial fibrillation ([place])    a. 03/2015 s/p RFCA;  b. CHA2DS2VASc = 5-->Eliquis.  . 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 Diabetes mellitus type 1 (HCC)    Insulin pump  . Diastolic CHF ([place])    a. 01/6961 - due to AF RVR;  b. 03/2015 Echo: EF 60-65%, no rwma.  . Hyperlipidemia   . Hypertension   . Hypothyroidism   . Insulin pump in place   . Ovarian cyst   . S/P radiation therapy 04/10/12- 05/26/2012   left Breast and Axilla / 50 gy / 25 Fractions with Left Breast Boost / 10 Gy / 5 Fractions  . SIADH (syndrome of inappropriate ADH production) ([place])   . Use of letrozole ([facility]) 06/13/12    Past Surgical History:  Procedure Laterality Date  . APPENDECTOMY    . 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 COLONOSCOPY     1 polyp   . ELECTROPHYSIOLOGIC STUDY N/A 04/08/2015   PVI + CTI ablation + posterior Box lesion + Atach ablation by [name]  . OVARIAN CYST SURGERY    . TEE WITHOUT CARDIOVERSION N/A 04/07/2015   Procedure: TRANSESOPHAGEAL ECHOCARDIOGRAM (TEE);  Surgeon: Thayer Headings, MD;  Location: [facility] ENDOSCOPY;  Service: Cardiovascular;  Laterality: N/A;  . TONSILLECTOMY      Family History  Problem Relation Age of Onset  . Breast cancer Mother 47  . Heart failure Mother   . Prostate cancer Maternal Uncle        died in his 51s  .  Heart attack Father   . Diabetes Maternal Uncle 15    Social History:  reports that  has never smoked. she has never used smokeless tobacco. She reports that she does not drink alcohol or use drugs.  REVIEW of systems:   She has a history of mild hypothyroidism followed by a New  holistic physician Again she is getting a regimen of 54m Armour Thyroid  TSH normal Previously but no recent follow-up available   She is complaining of persistent fatigue and not clear of the reason  Lab Results  Component Value Date   TSH 2.62 09/13/2017    HYPERCHOLESTEROLEMIA: Her LDL has been persistently high and refuses to take a statin drug including pravastatin even though it had no side effects previously.  She was previously trying to use red rice yeast , none now Previously her LDL particle number is consistently high at 1621  She was given a prescription for Crestor which she thinks she tried for 3-4 weeks but could not continue because she felt it was giving her headaches and making her tired  She Continues to be using natural remedies and essential oils for her hypercholesterolemia  She said that she is applying a concoction of several oils on her abdominal skin  Her LDL is relatively betterIn 5/18 , previously as high as 194  Lab Results  Component Value Date   CHOL 254 (H) 09/13/2017   CHOL 193 02/26/2017   CHOL 215 (H) 11/26/2016   Lab Results  Component Value Date  HDL 60.40 09/13/2017   HDL 63.40 02/26/2017   HDL 67.30 11/26/2016   Lab Results  Component Value Date   LDLCALC 178 (H) 09/13/2017   LDLCALC 120 (H) 02/26/2017   LDLCALC 137 (H) 11/26/2016   Lab Results  Component Value Date   TRIG 77.0 09/13/2017   TRIG 46.0 02/26/2017   TRIG 52.0 11/26/2016   Lab Results  Component Value Date   CHOLHDL 4 09/13/2017   CHOLHDL 3 02/26/2017   CHOLHDL 3 11/26/2016   Lab Results  Component Value Date   LDLDIRECT 148.4 08/06/2013   . She takes numerous herbal  supplements   EXAM:  BP 128/74   Pulse 68   Ht _0  (1.626 m)   Wt 157 lb 9.6 oz (71.5 kg)   SpO2 98%   BMI 27.05 kg/m    ASSESSMENT:  DIABETES:  See history of present illness for detailed discussion of current management, blood sugar patterns and problems identified  Her A1c is still fairly good at 6.5  As above she has used the freestyle Libre sensor which however appears to be reading slightly lower than expected blood sugars and fingersticks Her main patterns are nocturnal relatively low blood sugars, probably mostly below 70 Also has postprandial hyperglycemia especially after lunch either related to inadequate bolusing or more likely bolusing postprandially instead of before eating She is not entering carbohydrates in the pump and not clear if she knows how to program the pump herself    RECOMMENDATIONS:  BASAL rate changes: Midnight = 0.375, 4 AM = 0.875, 11 PM = 0.7 Carbohydrate coverage 1:5 at lunch Sensitivity continued at 1:40 and target blood sugar between 9 PM and 16 = 120 She must BOLUS before eating consistently Discussed that if she is not sure how much she believes she can bolus order.  Before eating and the rest after Will also recommend that she see the nurse educator to make sure she is following up on these She will try to get the Medtronic pump when eligible  HYPERTENSION:  well controlled   HYPERLIPIDEMIA: Discussed that her lipids are poorly controlled and she does need to be on a statin drug She is still very hesitant to do this, apparently did not tolerate Crestor and not clear if level is covered Will recheck on next visit  HYPOTHYROIDISM: Adequately replaced on recent follow-up levels, no records available from her holistic physician  Counseling time on subjects discussed in assessment and plan sections is over 50% of today's 25 minute visit  Dontavis Tschantz 09/17/2017, 9:58 AM

## 2017-09-17 ENCOUNTER — Telehealth: Payer: Self-pay | Admitting: Nutrition

## 2017-09-17 ENCOUNTER — Encounter: Payer: Self-pay | Admitting: Endocrinology

## 2017-09-17 ENCOUNTER — Ambulatory Visit: Payer: PPO | Admitting: Endocrinology

## 2017-09-17 VITALS — BP 128/74 | HR 68 | Ht 64.0 in | Wt 157.6 lb

## 2017-09-17 DIAGNOSIS — E1065 Type 1 diabetes mellitus with hyperglycemia: Secondary | ICD-10-CM | POA: Diagnosis not present

## 2017-09-17 DIAGNOSIS — E063 Autoimmune thyroiditis: Secondary | ICD-10-CM | POA: Diagnosis not present

## 2017-09-17 DIAGNOSIS — I10 Essential (primary) hypertension: Secondary | ICD-10-CM | POA: Diagnosis not present

## 2017-09-17 DIAGNOSIS — E7800 Pure hypercholesterolemia, unspecified: Secondary | ICD-10-CM

## 2017-09-17 NOTE — Patient Instructions (Addendum)
Carb 1:5 at lunch and BOLUS 5-10 min before the meal at least a partial bolus  Target 120 from 9 pm and 6 am

## 2017-09-17 NOTE — Telephone Encounter (Signed)
Thanks.  She needs to review her pump management in a couple of weeks because she is not bolusing before eating and is causing very high readings

## 2017-09-17 NOTE — Telephone Encounter (Signed)
Call to patient to see if she would like to come in to see me for pump setting changes.  She said that she made the changes.  When I asked her to review this with me, she had the I/C ratios all wrong, and the target was not changed. I talked her into making the above setting changes, and she said she would let me know if readings remained low, or if they were too high.

## 2017-10-14 DIAGNOSIS — E109 Type 1 diabetes mellitus without complications: Secondary | ICD-10-CM | POA: Diagnosis not present

## 2017-11-07 ENCOUNTER — Telehealth: Payer: Self-pay | Admitting: Internal Medicine

## 2017-11-07 NOTE — Telephone Encounter (Signed)
Pt c/o BP issue: STAT if pt c/o blurred vision, one-sided weakness or slurred speech  1. What are your last 5 BP readings? 64/48 2. Are you having any other symptoms (ex. Dizziness, headache, blurred vision, passed out)? headache  3. What is your BP issue? low

## 2017-11-07 NOTE — Telephone Encounter (Signed)
Patient called into afib clinic - states bp was in the 60 systolic at 40HK -- it has returned to 144/81 HR is stable. When her BP was low she was dizzy but this has subsided. Her blood glucose was 112. Pt had already taken losartan this morning. Encouraged pt to drink extra water today and try to avoid becoming dehydrated. If the hypotension should return she should go to ER for further evaluation. Pt voiced understanding.

## 2017-11-12 DIAGNOSIS — H2513 Age-related nuclear cataract, bilateral: Secondary | ICD-10-CM | POA: Diagnosis not present

## 2017-11-12 DIAGNOSIS — E119 Type 2 diabetes mellitus without complications: Secondary | ICD-10-CM | POA: Diagnosis not present

## 2017-11-12 LAB — HM DIABETES EYE EXAM

## 2017-11-14 DIAGNOSIS — E109 Type 1 diabetes mellitus without complications: Secondary | ICD-10-CM | POA: Diagnosis not present

## 2017-11-28 DIAGNOSIS — G720 Drug-induced myopathy: Secondary | ICD-10-CM | POA: Diagnosis not present

## 2017-11-28 DIAGNOSIS — I4891 Unspecified atrial fibrillation: Secondary | ICD-10-CM | POA: Diagnosis not present

## 2017-12-09 ENCOUNTER — Telehealth: Payer: Self-pay | Admitting: Endocrinology

## 2017-12-09 NOTE — Telephone Encounter (Signed)
Gina [name] from [place] is calling on the status of form for medical necessity, to see of you reviewed it , please advise.

## 2017-12-10 NOTE — Telephone Encounter (Signed)
Paperwork received and given to [place] today.

## 2017-12-12 ENCOUNTER — Other Ambulatory Visit (INDEPENDENT_AMBULATORY_CARE_PROVIDER_SITE_OTHER): Payer: PPO

## 2017-12-12 ENCOUNTER — Encounter: Payer: Self-pay | Admitting: *Deleted

## 2017-12-12 DIAGNOSIS — E1065 Type 1 diabetes mellitus with hyperglycemia: Secondary | ICD-10-CM

## 2017-12-12 LAB — COMPREHENSIVE METABOLIC PANEL
ALT: 19 U/L (ref 0–35)
AST: 22 U/L (ref 0–37)
Albumin: 3.5 g/dL (ref 3.5–5.2)
Alkaline Phosphatase: 69 U/L (ref 39–117)
BUN: 11 mg/dL (ref 6–23)
CHLORIDE: 98 meq/L (ref 96–112)
CO2: 31 meq/L (ref 19–32)
CREATININE: 0.72 mg/dL (ref 0.40–1.20)
Calcium: 9.2 mg/dL (ref 8.4–10.5)
GFR: 84.28 mL/min (ref >60.00)
Glucose, Bld: 197 mg/dL — ABNORMAL HIGH (ref 70–99)
Potassium: 4.2 mEq/L (ref 3.5–5.1)
SODIUM: 134 meq/L — AB (ref 135–145)
Total Bilirubin: 0.5 mg/dL (ref 0.2–1.2)
Total Protein: 6.3 g/dL (ref 6.0–8.3)

## 2017-12-12 LAB — LIPID PANEL
CHOL/HDL RATIO: 4
Cholesterol: 213 mg/dL — ABNORMAL HIGH (ref 0–200)
HDL: 57.2 mg/dL (ref >39.00)
LDL CALC: 146 mg/dL — AB (ref 0–99)
NONHDL: 156.05
Triglycerides: 51 mg/dL (ref 0.0–149.0)
VLDL: 10.2 mg/dL (ref 0.0–40.0)

## 2017-12-12 LAB — HEMOGLOBIN A1C: Hgb A1c MFr Bld: 6.5 % (ref 4.6–6.5)

## 2017-12-16 DIAGNOSIS — E109 Type 1 diabetes mellitus without complications: Secondary | ICD-10-CM | POA: Diagnosis not present

## 2017-12-17 ENCOUNTER — Encounter: Payer: Self-pay | Admitting: Endocrinology

## 2017-12-17 ENCOUNTER — Encounter: Payer: PPO | Admitting: Endocrinology

## 2017-12-17 NOTE — Progress Notes (Signed)
This encounter was created in error - please disregard.

## 2017-12-19 ENCOUNTER — Telehealth: Payer: Self-pay | Admitting: Endocrinology

## 2017-12-19 NOTE — Telephone Encounter (Signed)
[employer] called ph# [phone], Option 5 then Option 1  Re: they Need Certificate of medical necessity for a 670 G Pump

## 2017-12-20 NOTE — Telephone Encounter (Signed)
Please bring it to my desk

## 2017-12-20 NOTE — Telephone Encounter (Signed)
Can we get CMN please advise

## 2017-12-26 NOTE — Telephone Encounter (Signed)
Faxed todat

## 2017-12-27 ENCOUNTER — Telehealth: Payer: Self-pay | Admitting: Dietician

## 2017-12-27 NOTE — Telephone Encounter (Signed)
Patient called stating that her Medtronic 670 g pump and supplies have arrived and she needs an appointment to change from the Animous pump.  Will leave message with [name], CDE to return her call.  Antonieta Iba, RD, LDN, CDE

## 2018-01-01 ENCOUNTER — Telehealth: Payer: Self-pay | Admitting: Nutrition

## 2018-01-01 NOTE — Telephone Encounter (Signed)
Patient contacted to set an appt. To start her Medtronic 630G pump.  She reported that the [employer] rep.--Magda Paganini came to her house this morning, and trained her on this.

## 2018-01-14 NOTE — Progress Notes (Deleted)
Patient ID: Gina [name], female   DOB: 10/20/1945, 72 y.o.   MRN: [id number]   Reason for Appointment: Insulin Pump followup:   History of Present Illness   Diagnosis: Type 1 DIABETES MELITUS, date of diagnosis:  1984     CURRENT insulin pump:  One Touch Ping, has been on a pump since 1995  HISTORY:    The pump SETTINGS are: Midnight = 0.45, 4 AM 0.95.  6 AM = 1.025, 8:30 AM = 1.15, 11:30 AM = 1.1;, 4PM = 0.95 and 6 PM = 0.8   Total insulin dose about  30 units per day with 66 % in basal  Carbohydrate ratio 1:10 before 12 noon, 1:7 for lunch and 1: 8 After 5 PM Blood sugar target 110 .  Correction factor 1: 30   A1c is now fairly stable at 6.5, previously range 6.3- 6.7  Current management, blood sugar patterns and problems identified:    She has occasionally been comparing her readings on the freestyle [place] to her fingersticks and she thinks they are about 10-20 milligrams lower than the fingersticks  However still using the data for her pump management  Although her blood sugar is averaging only 123 on her pump recently her A1c is higher than expected  FASTING readings are fairly good with only occasional overnight low blood sugars on the sensor  HYPOGLYCEMIA: She appears to be getting fairly low blood sugars through the night and she has not notified us or change her basal rate; on her last visit the 4 AM basal rate was supposed to be reduced but she did not do so  She has only minimal or no symptoms of nocturnal hypoglycemia even when the sensor is treating low which is happening frequently overnight  Usually will feel hypoglycemic with blood sugars at 60 or below  She has fairly consistent postprandial hyperglycemia after LUNCH although occasionally it may not go up as much, highest blood sugars maybe up to 351 sensor  She says that she is usually trying to boluses before eating but there are occasional boluses at about 3 PM when the blood sugar has gone  up  She is frequently not entering CARBOHYDRATES in her pump when she is bolusing  Also occasionally may have high readings after her evening meal  Her weight is down steadily even though she is not exercising   GLUCOSE CONTROL with the pump is assessed today by pump and freestyle [employer]  Blood sugar data from freestyle [place] download:  Mean values apply above for all meters except median for One Touch  PRE-MEAL Fasting Lunch Dinner 4-6 AM  Overall  Glucose range:  94-2 03       Mean/median: 116    60  113    POST-MEAL PC Breakfast PC Lunch PC Dinner  Glucose range:    1 67-275   Mean/median:  200  154      EXERCISE: Not much lately because of fatigue  Wt Readings from Last 3 Encounters:  12/17/17 158 lb 12.8 oz (72 kg)  09/17/17 157 lb 9.6 oz (71.5 kg)  08/26/17 159 lb 12.8 oz (72.5 kg)    LABS:   Lab Results  Component Value Date   HGBA1C 6.5 12/12/2017   HGBA1C 6.5 09/13/2017   HGBA1C 6.6 (H) 06/06/2017   Lab Results  Component Value Date   MICROALBUR 0.9 02/26/2017   LDLCALC 146 (H) 12/12/2017   CREATININE 0.72 12/12/2017    Other problems discussed today:  Review of systems  No visits with results within 1 Week(s) from this visit.  Latest known visit with results is:  Abstract on 12/12/2017  Component Date Value Ref Range Status  . HM Diabetic Eye Exam 11/12/2017 No Retinopathy  No Retinopathy Final     Allergies as of 01/15/2018      Reactions   Statins Other (See Comments)   Leg pain & severe fatigue      Medication List        Accurate as of 01/14/18  9:39 PM. Always use your most recent med list.          Cod Liver Oil 1000 MG Caps Take 2,000 mg by mouth daily.   CoQ10 200 MG Caps Take 200 mg by mouth daily.   ELIQUIS 5 MG Tabs tablet Generic drug:  apixaban TAKE 1 TABLET BY MOUTH TWICE A DAY   FREESTYLE LIBRE READER Devi 1 Device by Does not apply route as directed.   FREESTYLE LIBRE SENSOR SYSTEM Misc 1 strip by Does  not apply route once a week. Apply to upper arm and change sensor every 10 days. Dx Code E10.9   glucosamine-chondroitin 500-400 MG tablet Take 1 tablet daily by mouth.   glucose blood test strip Commonly known as:  ONE TOUCH ULTRA TEST Use asto check blood sugar 5 times a day   GLUTATHIONE PO Take 15 mLs daily by mouth.   insulin aspart 100 UNIT/ML injection Commonly known as:  NOVOLOG BASAL RATE 0.9 7AM-12, 0.8 NOON-7 AM. MAX 60 UNITS SUBCUTANEOUSLY EVERY DAY WITH PUMP PER MD   losartan 25 MG tablet Commonly known as:  COZAAR Take 25 mg by mouth daily.   Menaquinone-7 100 MCG Caps Take 150 mcg by mouth daily.   NALTREXONE HCL PO Take 4.5 mg by mouth daily.   NON FORMULARY Systemic Formulas Pancreas supplement, 1 per day "                    "           Metabo-Shake (Glycemic Support) 1 scoop per day Reishi w/ Curcumin 1 per day   NON FORMULARY Systemic Formulas Glycemic Balance, 2 per day "                  "             Eyes supplement, 2 per day "                  "              ROX (Antioxidant w/ Reservatrol), 1 per day   NON FORMULARY Take 15 mLs daily by mouth. MAG CAL VANILLA LIQUID   NON FORMULARY Take 1 tablet 2 (two) times daily by mouth. BioFunction "I"   NON FORMULARY Take 1 tablet daily by mouth. Reishi w/ Curcuma   PROGESTERONE EX Apply 1 ml to skin daily   RESVERATROL PO Take 250 mg by mouth daily.   SUPER OMEGA-3 PO Take by mouth. Takes 2 capsules per day   thyroid 15 MG tablet Commonly known as:  ARMOUR Take 15 mg by mouth daily.   thyroid 30 MG tablet Commonly known as:  ARMOUR Take 30 mg by mouth daily before breakfast.   Turmeric Curcumin 500 MG Caps Take 1 tablet by mouth 3 (three) times daily.   Vitamin B-12 5000 MCG Subl Place 5,000 mcg under the tongue daily.   vitamin C 1000 MG  tablet Take 1,000 mg by mouth daily.   VITAMIN D PO Take 5,000 mcg by mouth daily.       Allergies:  Allergies  Allergen Reactions  .  Statins Other (See Comments)    Leg pain & severe fatigue    Past Medical History:  Diagnosis Date  . Anxiety    new dx  . Atrial fibrillation ([place])    a. 03/2015 s/p RFCA;  b. CHA2DS2VASc = 5-->Eliquis.  . 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 Diabetes mellitus type 1 ([facility])    Insulin pump  . Diastolic CHF ([place])    a. 12/5463 - due to AF RVR;  b. 03/2015 Echo: EF 60-65%, no rwma.  . Hyperlipidemia   . Hypertension   . Hypothyroidism   . Insulin pump in place   . Ovarian cyst   . S/P radiation therapy 04/10/12- 05/26/2012   left Breast and Axilla / 50 gy / 25 Fractions with Left Breast Boost / 10 Gy / 5 Fractions  . SIADH (syndrome of inappropriate ADH production) ([place])   . Use of letrozole ([place]) 06/13/12    Past Surgical History:  Procedure Laterality Date  . APPENDECTOMY    . 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 COLONOSCOPY     1 polyp   . ELECTROPHYSIOLOGIC STUDY N/A 04/08/2015   PVI + CTI ablation + posterior Box lesion + Atach ablation by [name]  . OVARIAN CYST SURGERY    . TEE WITHOUT CARDIOVERSION N/A 04/07/2015   Procedure: TRANSESOPHAGEAL ECHOCARDIOGRAM (TEE);  Surgeon: Thayer Headings, MD;  [facility] END[facility]COPY;  Service: Cardiovascular;  Laterality: N/A;  . TONSILLECTOMY      Family History  Problem Relation Age of Onset  . Breast cancer Mother 76  . Heart failure Mother   . Prostate cancer Maternal Uncle        died in his 15s  . Heart attack Father   . Diabetes Maternal Uncle 15    Social History:  reports that she has never smoked. She has never used smokeless tobacco. She reports that she does not drink alcohol or use drugs.  REVIEW of systems:   She has a history of mild hypothyroidism  followed by a New  holistic physician Again she is getting a regimen of '45mg'$  [place] Thyroid  TSH normal Previously but no recent follow-up available   She is complaining of persistent fatigue and not clear of the reason  Lab Results  Component Value Date   TSH 2.62 09/13/2017    HYPERCHOLESTEROLEMIA: Her LDL has been persistently high and refuses to take a statin drug including pravastatin even though it had no side effects previously.  She was previously trying to use red rice yeast , none now Previously her LDL particle number is consistently high at 1621  She was given a prescription for Crestor which she thinks she tried for 3-4 weeks but could not continue because she felt it was giving her headaches and making her tired  She Continues to be using natural remedies and essential oils for her hypercholesterolemia  She said that she is applying a concoction of several oils on her abdominal skin  Her LDL is relatively betterIn 5/18 , previously as high as 194  Lab Results  Component Value Date   CHOL 213 (H) 12/12/2017   CHOL 254 (H) 09/13/2017   CHOL 193 02/26/2017   Lab Results  Component Value Date   HDL 57.20 12/12/2017   HDL 60.40 09/13/2017   HDL 63.40 02/26/2017   Lab Results  Component Value Date   LDLCALC 146 (H) 12/12/2017   LDLCALC 178 (H) 09/13/2017   LDLCALC 120 (H) 02/26/2017   Lab Results  Component Value Date   TRIG 51.0 12/12/2017   TRIG 77.0 09/13/2017   TRIG 46.0 02/26/2017   Lab Results  Component Value Date   CHOLHDL 4 12/12/2017   CHOLHDL 4 09/13/2017   CHOLHDL 3 02/26/2017   Lab Results  Component Value Date   LDLDIRECT 148.4 08/06/2013   . She takes numerous herbal supplements   EXAM:  There were no vitals taken for this visit.   ASSESSMENT:  DIABETES:  See history of present illness for detailed discussion of current management, blood sugar patterns and problems identified  Her A1c is still fairly good at 6.5  As  above she has used the freestyle [place] sensor which however appears to be reading slightly lower than expected blood sugars and fingersticks Her main patterns are nocturnal relatively low blood sugars, probably mostly below 70 Also has postprandial hyperglycemia especially after lunch either related to inadequate bolusing or more likely bolusing postprandially instead of before eating She is not entering carbohydrates in the pump and not clear if she knows how to program the pump herself    RECOMMENDATIONS:  BASAL rate changes: Midnight = 0.375, 4 AM = 0.875, 11 PM = 0.7 Carbohydrate coverage 1:5 at lunch Sensitivity continued at 1:40 and target blood sugar between 9 PM and 16 = 120 She must BOLUS before eating consistently Discussed that if she is not sure how much she believes she can bolus order.  Before eating and the rest after Will also recommend that she see the nurse educator to make sure she is following up on these She will try to get the [employer] pump when eligible  HYPERTENSION:  well controlled   HYPERLIPIDEMIA: Discussed that her lipids are poorly controlled and she does need to be on a statin drug She is still very hesitant to do this, apparently did not tolerate Crestor and not clear if level is covered Will recheck on next visit  HYPOTHYROIDISM: Adequately replaced on recent follow-up levels, no records available from her holistic physician  Counseling time on subjects discussed in assessment and plan sections is over 50% of today's 25 minute visit  Elayne Snare 01/14/2018, 9:39 PM

## 2018-01-15 ENCOUNTER — Ambulatory Visit: Payer: PPO | Admitting: Endocrinology

## 2018-01-16 DIAGNOSIS — E1065 Type 1 diabetes mellitus with hyperglycemia: Secondary | ICD-10-CM | POA: Diagnosis not present

## 2018-01-20 DIAGNOSIS — E034 Atrophy of thyroid (acquired): Secondary | ICD-10-CM | POA: Diagnosis not present

## 2018-01-20 DIAGNOSIS — I10 Essential (primary) hypertension: Secondary | ICD-10-CM | POA: Diagnosis not present

## 2018-01-20 DIAGNOSIS — Z7689 Persons encountering health services in other specified circumstances: Secondary | ICD-10-CM | POA: Diagnosis not present

## 2018-01-20 DIAGNOSIS — E109 Type 1 diabetes mellitus without complications: Secondary | ICD-10-CM | POA: Diagnosis not present

## 2018-01-20 DIAGNOSIS — E7800 Pure hypercholesterolemia, unspecified: Secondary | ICD-10-CM | POA: Diagnosis not present

## 2018-02-17 DIAGNOSIS — E1065 Type 1 diabetes mellitus with hyperglycemia: Secondary | ICD-10-CM | POA: Diagnosis not present

## 2018-02-19 ENCOUNTER — Other Ambulatory Visit (INDEPENDENT_AMBULATORY_CARE_PROVIDER_SITE_OTHER): Payer: PPO

## 2018-02-19 ENCOUNTER — Other Ambulatory Visit: Payer: Self-pay | Admitting: Endocrinology

## 2018-02-19 DIAGNOSIS — E1065 Type 1 diabetes mellitus with hyperglycemia: Secondary | ICD-10-CM

## 2018-02-19 LAB — LIPID PANEL
Cholesterol: 203 mg/dL — ABNORMAL HIGH (ref 0–200)
HDL: 57.6 mg/dL (ref >39.00)
LDL Cholesterol: 131 mg/dL — ABNORMAL HIGH (ref 0–99)
NONHDL: 145.01
Total CHOL/HDL Ratio: 4
Triglycerides: 72 mg/dL (ref 0.0–149.0)
VLDL: 14.4 mg/dL (ref 0.0–40.0)

## 2018-02-19 LAB — COMPREHENSIVE METABOLIC PANEL
ALBUMIN: 3.8 g/dL (ref 3.5–5.2)
ALT: 19 U/L (ref 0–35)
AST: 22 U/L (ref 0–37)
Alkaline Phosphatase: 59 U/L (ref 39–117)
BUN: 6 mg/dL (ref 6–23)
CHLORIDE: 94 meq/L — AB (ref 96–112)
CO2: 31 mEq/L (ref 19–32)
CREATININE: 0.7 mg/dL (ref 0.40–1.20)
Calcium: 9.1 mg/dL (ref 8.4–10.5)
GFR: 87.02 mL/min (ref >60.00)
GLUCOSE: 231 mg/dL — AB (ref 70–99)
POTASSIUM: 4.7 meq/L (ref 3.5–5.1)
SODIUM: 130 meq/L — AB (ref 135–145)
Total Bilirubin: 0.5 mg/dL (ref 0.2–1.2)
Total Protein: 6.5 g/dL (ref 6.0–8.3)

## 2018-02-19 LAB — HEMOGLOBIN A1C: Hgb A1c MFr Bld: 6.6 % — ABNORMAL HIGH (ref 4.6–6.5)

## 2018-02-19 LAB — TSH: TSH: 1.56 u[IU]/mL (ref 0.35–4.50)

## 2018-02-19 LAB — MICROALBUMIN / CREATININE URINE RATIO
Creatinine,U: 123.5 mg/dL
MICROALB UR: 0.9 mg/dL (ref 0.0–1.9)
MICROALB/CREAT RATIO: 0.7 mg/g (ref 0.0–30.0)

## 2018-02-25 ENCOUNTER — Encounter: Payer: Self-pay | Admitting: Endocrinology

## 2018-02-25 ENCOUNTER — Ambulatory Visit (INDEPENDENT_AMBULATORY_CARE_PROVIDER_SITE_OTHER): Payer: PPO | Admitting: Endocrinology

## 2018-02-25 VITALS — BP 132/64 | HR 76 | Ht 64.0 in | Wt 158.6 lb

## 2018-02-25 DIAGNOSIS — I10 Essential (primary) hypertension: Secondary | ICD-10-CM

## 2018-02-25 DIAGNOSIS — E038 Other specified hypothyroidism: Secondary | ICD-10-CM

## 2018-02-25 DIAGNOSIS — E1065 Type 1 diabetes mellitus with hyperglycemia: Secondary | ICD-10-CM | POA: Diagnosis not present

## 2018-02-25 DIAGNOSIS — E7800 Pure hypercholesterolemia, unspecified: Secondary | ICD-10-CM

## 2018-02-25 NOTE — Patient Instructions (Signed)
Use temp basal for increased activity  Bolus in am before eating

## 2018-02-25 NOTE — Progress Notes (Signed)
Patient ID: Gina [name], female   DOB: [birth year], 72 y.o.   MRN: [id number]   Reason for Appointment: Insulin Pump followup:   History of Present Illness   Diagnosis: Type 1 DIABETES MELITUS, date of diagnosis:  1984     CURRENT insulin pump:  Minimed 670 has been on a pump since 1995  HISTORY:    The pump SETTINGS are: Midnight = 0. 375, 4 AM 0. 875.  6 AM = 0.95, 8:30 AM = 1.15, 11:30 AM = 1.1;, 4PM = 0.95 and 6 PM = 0.8, 11 PM 0.7 Total basal insulin 20 units  Carbohydrate ratio 1: 8 before 12 noon, 1:5 for lunch and 1: 8 After 5 PM Blood sugar target 110 .  Correction factor 1: 50   A1c is now fairly stable at 6.5, previously range 6.3- 6.7  Current management, blood sugar patterns and problems identified:    She has on an average insulin blood sugars but still on even with controlled with tendency to periodic hypoglycemia hyperglycemia  She is entering her blood sugars and her pump fairly regularly but previously had found her freestyle libre to be somewhat lower than fingersticks  HYPERGLYCEMIA is occurring mostly after breakfast and she is now trying to eat oatmeal,: This may be variable depending on her activity level and pre-meal blood sugar but may have blood sugars up to 250 after breakfast  She also has sporadic high blood sugars after lunch and dinner over 180  She is still bolusing AFTER eating even when she knows what she is eating such as at breakfast  Does not think she is forgetting her boluses now  She is still not comfortable programming the new pump and was instructed on this in March  She is complaining about the fact that there is a stage of insulin in the cartridge with the new pump  FASTING readings are fairly good but she thinks blood sugars are rising after she wakes up   HYPOGLYCEMIA: She has had tendency to hypoglycemia late at night around midnight and sometimes for longer periods of times but she is usually asymptomatic at this  time  Normally she would have symptoms of shakiness and weakness with low sugars during the day  Some of her low sugars are related to increased activity and most likely has low sugars around lunchtime and occasionally in the early afternoon  She does not know how to use the TEMPORARY basal function on her pump and has not tried to suspend very much with activity  She is sometimes not entering CARBOHYDRATES in her pump when she is bolusing especially around lunchtime, some of her boluses in the afternoon compared to be postprandial   GLUCOSE CONTROL with the pump is assessed today by pump and freestyle [employer]  Blood sugar data from freestyle Hooks download:  Mean values apply above for all meters except median for One Touch  PRE-MEAL Fasting Lunch Dinner  12 AM-2am Overall  Glucose range:       Mean/median:  119  125  135  83  132   POST-MEAL PC Breakfast PC Lunch PC Dinner  Glucose range:     Mean/median:  171  156  154      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   Wt Readings from Last 3 Encounters:  02/25/18 158 lb 9.6 oz (71.9 kg)  12/17/17 158 lb 12.8 oz (72 kg)  09/17/17 157 lb 9.6 oz (71.5 kg)  LABS:   Lab Results  Component Value Date   HGBA1C 6.6 (H) 02/19/2018   HGBA1C 6.5 12/12/2017   HGBA1C 6.5 09/13/2017   Lab Results  Component Value Date   MICROALBUR 0.9 02/19/2018   LDLCALC 131 (H) 02/19/2018   CREATININE 0.70 02/19/2018    Other problems discussed today: Review of systems  Lab on 02/19/2018  Component Date Value Ref Range Status  . TSH 02/19/2018 1.56  0.35 - 4.50 uIU/mL Final  . Cholesterol 02/19/2018 203* 0 - 200 mg/dL Final   ATP III Classification       Desirable:  < 200 mg/dL               Borderline High:  200 - 239 mg/dL          High:  > = 240 mg/dL  . Triglycerides 02/19/2018 72.0  0.0 - 149.0 mg/dL Final   Normal:  <150 mg/dLBorderline High:  150 - 199 mg/dL  . HDL 02/19/2018 57.60  >39.00 mg/dL Final  .  VLDL 02/19/2018 14.4  0.0 - 40.0 mg/dL Final  . LDL Cholesterol 02/19/2018 131* 0 - 99 mg/dL Final  . Total CHOL/HDL Ratio 02/19/2018 4   Final                  Men          Women1/2 Average Risk     3.4          3.3Average Risk          5.0          4.42X Average Risk          9.6          7.13X Average Risk          15.0          11.0                      . NonHDL 02/19/2018 145.01   Final   NOTE:  Non-HDL goal should be 30 mg/dL higher than patient's LDL goal (i.e. LDL goal of < 70 mg/dL, would have non-HDL goal of < 100 mg/dL)  . Microalb, Ur 02/19/2018 0.9  0.0 - 1.9 mg/dL Final  . Creatinine,U 02/19/2018 123.5  mg/dL Final  . Microalb Creat Ratio 02/19/2018 0.7  0.0 - 30.0 mg/g Final  . Sodium 02/19/2018 130* 135 - 145 mEq/L Final  . Potassium 02/19/2018 4.7  3.5 - 5.1 mEq/L Final  . Chloride 02/19/2018 94* 96 - 112 mEq/L Final  . CO2 02/19/2018 31  19 - 32 mEq/L Final  . Glucose, Bld 02/19/2018 231* 70 - 99 mg/dL Final  . BUN 02/19/2018 6  6 - 23 mg/dL Final  . Creatinine, Ser 02/19/2018 0.70  0.40 - 1.20 mg/dL Final  . Total Bilirubin 02/19/2018 0.5  0.2 - 1.2 mg/dL Final  . Alkaline Phosphatase 02/19/2018 59  39 - 117 U/L Final  . AST 02/19/2018 22  0 - 37 U/L Final  . ALT 02/19/2018 19  0 - 35 U/L Final  . Total Protein 02/19/2018 6.5  6.0 - 8.3 g/dL Final  . Albumin 02/19/2018 3.8  3.5 - 5.2 g/dL Final  . Calcium 02/19/2018 9.1  8.4 - 10.5 mg/dL Final  . GFR 02/19/2018 87.02  >60.00 mL/min Final  . Hgb A1c MFr Bld 02/19/2018 6.6* 4.6 - 6.5 % Final   Glycemic Control Guidelines for People with Diabetes:Non Diabetic:  <6%Goal of  Therapy: <7%Additional Action Suggested:  >8%      Allergies as of 02/25/2018      Reactions   Statins Other (See Comments)   Leg pain & severe fatigue      Medication List        Accurate as of 02/25/18  2:54 PM. Always use your most recent med list.          Cod Liver Oil 1000 MG Caps Take 2,000 mg by mouth daily.   CoQ10 200 MG  Caps Take 200 mg by mouth daily.   ELIQUIS 5 MG Tabs tablet Generic drug:  apixaban TAKE 1 TABLET BY MOUTH TWICE A DAY   FREESTYLE LIBRE READER Devi 1 Device by Does not apply route as directed.   FREESTYLE LIBRE SENSOR SYSTEM Misc 1 strip by Does not apply route once a week. Apply to upper arm and change sensor every 10 days. Dx Code E10.9   glucosamine-chondroitin 500-400 MG tablet Take 1 tablet daily by mouth.   glucose blood test strip Commonly known as:  ONE TOUCH ULTRA TEST Use asto check blood sugar 5 times a day   GLUTATHIONE PO Take 15 mLs daily by mouth.   insulin aspart 100 UNIT/ML injection Commonly known as:  NOVOLOG BASAL RATE 0.9 7AM-12, 0.8 NOON-7 AM. MAX 60 UNITS SUBCUTANEOUSLY EVERY DAY WITH PUMP PER MD   losartan 25 MG tablet Commonly known as:  COZAAR Take 25 mg by mouth daily.   Menaquinone-7 100 MCG Caps Take 150 mcg by mouth daily.   NON FORMULARY Systemic Formulas Pancreas supplement, 1 per day "                    "           Metabo-Shake (Glycemic Support) 1 scoop per day Reishi w/ Curcumin 1 per day   NON FORMULARY Systemic Formulas Glycemic Balance, 2 per day "                  "             Eyes supplement, 2 per day "                  "              ROX (Antioxidant w/ Reservatrol), 1 per day   NON FORMULARY Take 15 mLs daily by mouth. MAG CAL VANILLA LIQUID   NON FORMULARY Take 1 tablet 2 (two) times daily by mouth. BioFunction "I"   NON FORMULARY Take 1 tablet daily by mouth. Reishi w/ Curcuma   PROGESTERONE EX Apply 1 ml to skin daily   RESVERATROL PO Take 250 mg by mouth daily.   SUPER OMEGA-3 PO Take by mouth. Takes 2 capsules per day   thyroid 15 MG tablet Commonly known as:  ARMOUR Take 15 mg by mouth daily.   thyroid 30 MG tablet Commonly known as:  ARMOUR Take 30 mg by mouth daily before breakfast.   Turmeric Curcumin 500 MG Caps Take 1 tablet by mouth 3 (three) times daily.   Vitamin B-12 5000 MCG  Subl Place 5,000 mcg under the tongue daily.   vitamin C 1000 MG tablet Take 1,000 mg by mouth daily.   VITAMIN D PO Take 5,000 mcg by mouth daily.       Allergies:  Allergies  Allergen Reactions  . Statins Other (See Comments)    Leg pain & severe fatigue    Past  Medical History:  Diagnosis Date  . Anxiety    new dx  . Atrial fibrillation ([place])    a. 03/2015 s/p RFCA;  b. CHA2DS2VASc = 5-->Eliquis.  . 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 Diabetes mellitus type 1 ([facility])    Insulin pump  . Diastolic CHF ([place])    a. 03/2693 - due to AF RVR;  b. 03/2015 Echo: EF 60-65%, no rwma.  . Hyperlipidemia   . Hypertension   . Hypothyroidism   . Insulin pump in place   . Ovarian cyst   . S/P radiation therapy 04/10/12- 05/26/2012   left Breast and Axilla / 50 gy / 25 Fractions with Left Breast Boost / 10 Gy / 5 Fractions  . SIADH (syndrome of inappropriate ADH production) ([place])   . Use of letrozole ([place]) 06/13/12    Past Surgical History:  Procedure Laterality Date  . APPENDECTOMY    . 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 COLONOSCOPY     1 polyp   . ELECTROPHYSIOLOGIC STUDY N/A 04/08/2015   PVI + CTI ablation + posterior Box lesion + Atach ablation by [name]  . OVARIAN CYST SURGERY    . TEE WITHOUT CARDIOVERSION N/A 04/07/2015   Procedure: TRANSESOPHAGEAL ECHOCARDIOGRAM (TEE);  Surgeon: Thayer Headings, MD;  Location: [facility] ENDOSCOPY;  Service: Cardiovascular;  Laterality: N/A;  . TONSILLECTOMY      Family History  Problem Relation Age of Onset  . Breast cancer Mother 39  . Heart failure Mother   . Prostate cancer Maternal Uncle        died in his 18s  . Heart attack Father   . Diabetes Maternal Uncle 15     Social History:  reports that she has never smoked. She has never used smokeless tobacco. She reports that she does not drink alcohol or use drugs.  REVIEW of systems:   She has a history of mild hypothyroidism followed by a holistic physician Again she is getting stable controlled with a regimen of '45mg'$  Armour Thyroid  TSH normal     Lab Results  Component Value Date   TSH 1.56 02/19/2018    HYPERCHOLESTEROLEMIA: Her LDL has been persistently high and refuses to take a statin drug including pravastatin even though it had no side effects previously.  She was previously trying to use red rice yeast , none now Previously her LDL particle number is consistently high at 1621  She was given a prescription for Crestor which she thinks she tried for 3-4 weeks but could not continue because she felt it was giving her headaches and making her tired  She Continues to be using natural remedies and essential oils for her hypercholesterolemia She said that she is applying a concoction of several oils twice a day now on her abdominal skin  Her LDL is relatively improved now, previously as high as 194  Lab Results  Component Value Date   CHOL 203 (H) 02/19/2018   CHOL 213 (H) 12/12/2017   CHOL 254 (H) 09/13/2017   Lab Results  Component Value Date   HDL 57.60 02/19/2018   HDL 57.20 12/12/2017   HDL 60.40 09/13/2017   Lab Results  Component Value Date   LDLCALC 131 (H) 02/19/2018   LDLCALC 146 (  H) 12/12/2017   LDLCALC 178 (H) 09/13/2017   Lab Results  Component Value Date   TRIG 72.0 02/19/2018   TRIG 51.0 12/12/2017   TRIG 77.0 09/13/2017   Lab Results  Component Value Date   CHOLHDL 4 02/19/2018   CHOLHDL 4 12/12/2017   CHOLHDL 4 09/13/2017   Lab Results  Component Value Date   LDLDIRECT 148.4 08/06/2013   . She takes numerous herbal supplements as before  No history of numbness or tingling in her feet    EXAM:  BP 132/64 (BP Location: Right Arm, Patient  Position: Sitting, Cuff Size: Normal)   Pulse 76   Ht '5\' 4"'$  (1.626 m)   Wt 158 lb 9.6 oz (71.9 kg)   SpO2 96%   BMI 27.22 kg/m   Diabetic Foot Exam - Simple   Simple Foot Form Diabetic Foot exam was performed with the following findings:  Yes   Visual Inspection No deformities, no ulcerations, no other skin breakdown bilaterally:  Yes Sensation Testing Intact to touch and monofilament testing bilaterally:  Yes Pulse Check Posterior Tibialis and Dorsalis pulse intact bilaterally:  Yes See comments:  Yes Comments Relatively decreased left posterior tibialis      ASSESSMENT:  DIABETES:  See history of present illness for detailed discussion of current management, blood sugar patterns and problems identified  Her A1c is still fairly good at 6.5  She is using the Medtronic pump now although with about the same settings as before  Currently not able to manage her diabetes adequately as discussed above with certain periods of hypoglycemia and hyperglycemia Her hyperglycemia is most related to eating breakfast in the morning with only carbohydrate and no protein but she prefers to eat only oatmeal Some of the high readings in the afternoon may be because she prefers to bolus after eating and usually is late for her lunchtime bolus, also not entering carbohydrates at that time frequently Postprandial readings are not consistently high after lunch and dinner with averages about 150+  She also has difficulty managing her increased activity levels that can cause hypoglycemia since she is not aware of the temporary basal setting and the new pump and is not used to doing it This was demonstrated to her in detail She is also not able to program her pump basal rate and she will see the nurse educator for this    RECOMMENDATIONS:  She will make changes in management as above BASAL rate changes: Midnight = 0.3, 6 AM = 1.05, 8:30 AM = 1.05, 8 PM = 0.5  She must BOLUS before eating at  least breakfast consistently Also try to bolus one time at lunchtime Temporary basal for exercise or increase activity as discussed   HYPERTENSION:  well controlled Reassured her that the current pharmacy dose for her losartan should be without contaminants as long as there is no recall  HYPERLIPIDEMIA: Discussed that she is at high cardiovascular risk and she does need to be on a statin drug She is still very hesitant to do this and wants to continue nonprescription remedies  HYPOTHYROIDISM: Adequately replaced on 45 mg Armour Thyroid    Elayne Snare 02/25/2018, 2:54 PM

## 2018-03-20 DIAGNOSIS — E1065 Type 1 diabetes mellitus with hyperglycemia: Secondary | ICD-10-CM | POA: Diagnosis not present

## 2018-04-01 ENCOUNTER — Telehealth: Payer: Self-pay

## 2018-04-01 NOTE — Telephone Encounter (Signed)
Katharine Look from [facility] called today requesting results of C-peptide for this patient. I informed her that Dr. Dwyane Dee is on vaca and he is aware that the order needs to be placed per Olen Cordial- please contact Katharine Look at [phone] hit opt#2 when this is done

## 2018-04-07 ENCOUNTER — Encounter: Payer: PPO | Attending: Endocrinology | Admitting: Nutrition

## 2018-04-07 ENCOUNTER — Other Ambulatory Visit: Payer: Self-pay | Admitting: Endocrinology

## 2018-04-07 DIAGNOSIS — E1065 Type 1 diabetes mellitus with hyperglycemia: Secondary | ICD-10-CM

## 2018-04-07 DIAGNOSIS — E109 Type 1 diabetes mellitus without complications: Secondary | ICD-10-CM

## 2018-04-07 NOTE — Patient Instructions (Signed)
Call when you start your Bayer test strips to link meter to pump

## 2018-04-07 NOTE — Telephone Encounter (Signed)
She will need to have fasting labs for this.  Orders have been placed. Please make sure that Gina [name]  does not collect BMP and A1c that is already ordered for later

## 2018-04-07 NOTE — Progress Notes (Signed)
Patient's old 630G pump had a crack in the case, and was sent a new pump.  She wanted me to see if the settings were put in correctly. Basal rates were not in there correctly, and so were changed:   MN: 0.325,  4AM: 0.90 (patient increased these on her own, because FBSs were going "high--160s".   6AM: 1.05, 11:30: 1.05,  4PM: 0.95, 6PM: 0.80, 8PM: 0.5.    I/C12AM: 8, 12PM: 5, 5PM: 8.  ISF: 50, target: 100-110.  Timing: 4 hours. She did not bring her Bayer meter, because she is using up her old test strips.  She was told that when she runs out of those and starts on the Bayer strips, to call me, and I will talk her into linking the meter to the pump.  She agreed to do this.

## 2018-04-09 ENCOUNTER — Telehealth: Payer: Self-pay | Admitting: Endocrinology

## 2018-04-09 NOTE — Telephone Encounter (Signed)
Katharine Look calling from [facility] to see if patient have had her c peptide blood draw done, it need to be drawn at the same time as the fasting blood glucose.  Please advise  [phone] opt 2

## 2018-04-10 NOTE — Telephone Encounter (Signed)
Pt has been scheduled for labs on 04/11/2018 at 0800.

## 2018-04-11 ENCOUNTER — Other Ambulatory Visit (INDEPENDENT_AMBULATORY_CARE_PROVIDER_SITE_OTHER): Payer: PPO

## 2018-04-11 DIAGNOSIS — E1065 Type 1 diabetes mellitus with hyperglycemia: Secondary | ICD-10-CM

## 2018-04-11 LAB — GLUCOSE, RANDOM: Glucose, Bld: 144 mg/dL — ABNORMAL HIGH (ref 70–99)

## 2018-04-12 LAB — C-PEPTIDE

## 2018-04-21 DIAGNOSIS — E1065 Type 1 diabetes mellitus with hyperglycemia: Secondary | ICD-10-CM | POA: Diagnosis not present

## 2018-04-23 ENCOUNTER — Telehealth: Payer: Self-pay | Admitting: Nutrition

## 2018-04-23 NOTE — Telephone Encounter (Signed)
Okay.  Most likely she needs to bolus 2 units anyway in the morning before her coffee and breakfast protein.  Previously was eating oatmeal and not bolusing before eating

## 2018-04-23 NOTE — Telephone Encounter (Signed)
Patient reports that blood sugars rise in the AM when not eating any carbs.  FBS today was 105.  Ate one egg and 1 piece of sausage, with 1 cup of black coffee.  Blood sugar was over 200.  This is happening daily. Told her that tomorrow test blood sugar on rising, eat/drink nothing and test in 3 hours.  If no rise, bolus for coffee and protein.  If a rise, will need a basal rate adjustment.  Promised to call me and let me know.

## 2018-04-28 NOTE — Telephone Encounter (Signed)
Pt. Called me to let me know that her basal rate did not change from 4AM to 9AM on Thursday morning with no food  FBS at 4AM was 120 and at 9AM was 125.   She was told to bolus for the coffee (it is decaf),  but breakfast is protein, but high in fat.

## 2018-05-05 DIAGNOSIS — N951 Menopausal and female climacteric states: Secondary | ICD-10-CM | POA: Diagnosis not present

## 2018-05-05 DIAGNOSIS — E039 Hypothyroidism, unspecified: Secondary | ICD-10-CM | POA: Diagnosis not present

## 2018-05-05 DIAGNOSIS — E1059 Type 1 diabetes mellitus with other circulatory complications: Secondary | ICD-10-CM | POA: Diagnosis not present

## 2018-05-05 DIAGNOSIS — I10 Essential (primary) hypertension: Secondary | ICD-10-CM | POA: Diagnosis not present

## 2018-05-12 ENCOUNTER — Other Ambulatory Visit: Payer: Self-pay

## 2018-05-12 NOTE — Patient Outreach (Signed)
[facility]) Care Management  05/12/2018  Shiane Wenberg December 09, 1945 [id number]   Referral Date: 05/06/18 Referral Source: [facility] Referral Reason:   Multiple chronic conditions   Outreach Attempt #1 Spoke with patient.  She is able to verify HIPAA. Discussed with her reason for referral. Patient states that she sees her doctors on a regular basis and that her endocrinologist has a Futures trader and that all her needs are met.  Discussed [facility] services.  Patient declined services as she has no needs.     Plan: RN CM will close case.    Jone Baseman, RN, MSN [facility] Care Management Care Management Coordinator Direct Line [phone] Toll Free: [phone]  Fax: [phone]

## 2018-05-19 ENCOUNTER — Telehealth: Payer: Self-pay | Admitting: Emergency Medicine

## 2018-05-19 DIAGNOSIS — E039 Hypothyroidism, unspecified: Secondary | ICD-10-CM | POA: Diagnosis not present

## 2018-05-19 NOTE — Telephone Encounter (Signed)
Pt called and asked that you give her a call back. She has insulin pump questions that she would like to discuss with you. Thanks.

## 2018-05-20 NOTE — Telephone Encounter (Signed)
Patient wanted to know how we can view her Gina [name] results.  She was given the website to go to to set up a libre account, with directions for downloading.  She was also given the toll free telephone number for assistance with this.

## 2018-05-22 DIAGNOSIS — E1065 Type 1 diabetes mellitus with hyperglycemia: Secondary | ICD-10-CM | POA: Diagnosis not present

## 2018-05-23 ENCOUNTER — Other Ambulatory Visit (INDEPENDENT_AMBULATORY_CARE_PROVIDER_SITE_OTHER): Payer: PPO

## 2018-05-23 DIAGNOSIS — E1065 Type 1 diabetes mellitus with hyperglycemia: Secondary | ICD-10-CM

## 2018-05-23 LAB — BASIC METABOLIC PANEL
BUN: 10 mg/dL (ref 6–23)
CO2: 28 mEq/L (ref 19–32)
CREATININE: 0.83 mg/dL (ref 0.40–1.20)
Calcium: 9 mg/dL (ref 8.4–10.5)
Chloride: 98 mEq/L (ref 96–112)
GFR: 71.44 mL/min (ref >60.00)
Glucose, Bld: 141 mg/dL — ABNORMAL HIGH (ref 70–99)
Potassium: 4.3 mEq/L (ref 3.5–5.1)
Sodium: 133 mEq/L — ABNORMAL LOW (ref 135–145)

## 2018-05-23 LAB — HEMOGLOBIN A1C: HEMOGLOBIN A1C: 7.3 % — AB (ref 4.6–6.5)

## 2018-05-27 NOTE — Progress Notes (Signed)
Patient ID: Gina [name], female   DOB: 08-20-1946, 72 y.o.   MRN: [id number]   Reason for Appointment: Insulin Pump followup:   History of Present Illness   Diagnosis: Type 1 DIABETES MELITUS, date of diagnosis:  1984     CURRENT insulin pump:  Minimed 670 has been on a pump since 1995  HISTORY:    The pump SETTINGS are: Midnight = 0. 375, 4 AM 0. 875.  6 AM = 0.95, 8:30 AM = 1.15, 11:30 AM = 1.1;, 4PM = 0.95 and 6 PM = 0.8, 11 PM 0.7 Total basal insulin 20 units  Carbohydrate ratio 1: 8 before 12 noon, 1:5 for lunch and 1: 8 After 5 PM Blood sugar target 110 .  Correction factor 1: 50   A1c is now relatively higher at 7.3, previously 6.6  Current management, blood sugar patterns and problems identified:    Her blood sugars are somewhat variably controlled although on an average fairly good recently  Her blood sugar patterns were reviewed from the freestyle [employer] and data from her pump was also reviewed for her day-to-day bolus management  Although her A1c is higher her recent blood sugars are not reflecting this is an average blood sugar for the last 2 weeks is only 132  HIGHEST blood sugars are in the afternoon related to either missing her lunchtime bolus or taking inadequate coverage for her lunch meal especially with eating more fruits without adequate protein  She is frequently not entering carbohydrates at mealtimes especially at lunchtime  Blood sugars are variable after breakfast and suppertime but not as consistently high  Most likely needs a higher basal rate in the afternoon anyway  She does have a tendency to low normal or low sugars right before suppertime at 6:00 as also on her sensor appears to have HYPOGLYCEMIA on some nights between 1 AM and 4 AM, this is asymptomatic  GLUCOSE CONTROL with the pump is assessed today by pump and freestyle [employer]  Blood sugar data from freestyle [employer] download:    PRE-MEAL Fasting Lunch  Dinner Bedtime Overall  Glucose range:       Mean/median:  122  137  127  131   132+/-46   POST-MEAL PC Breakfast PC Lunch PC Dinner  Glucose range:     Mean/median:  151  160  152    Previously:  PRE-MEAL Fasting Lunch Dinner  12 AM-2am Overall  Glucose range:       Mean/median:  119  125  135  83  132   POST-MEAL PC Breakfast PC Lunch PC Dinner  Glucose range:     Mean/median:  171  156  154      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, less recently   Wt Readings from Last 3 Encounters:  05/28/18 156 lb 3.2 oz (70.9 kg)  02/25/18 158 lb 9.6 oz (71.9 kg)  12/17/17 158 lb 12.8 oz (72 kg)    LABS:   Lab Results  Component Value Date   HGBA1C 7.3 (H) 05/23/2018   HGBA1C 6.6 (H) 02/19/2018   HGBA1C 6.5 12/12/2017   Lab Results  Component Value Date   MICROALBUR 0.9 02/19/2018   LDLCALC 131 (H) 02/19/2018   CREATININE 0.83 05/23/2018    Other problems discussed today: Review of systems  Lab on 05/23/2018  Component Date Value Ref Range Status  . Sodium 05/23/2018 133* 135 - 145 mEq/L Final  . Potassium 05/23/2018 4.3  3.5 -  5.1 mEq/L Final  . Chloride 05/23/2018 98  96 - 112 mEq/L Final  . CO2 05/23/2018 28  19 - 32 mEq/L Final  . Glucose, Bld 05/23/2018 141* 70 - 99 mg/dL Final  . BUN 05/23/2018 10  6 - 23 mg/dL Final  . Creatinine, Ser 05/23/2018 0.83  0.40 - 1.20 mg/dL Final  . Calcium 05/23/2018 9.0  8.4 - 10.5 mg/dL Final  . GFR 05/23/2018 71.44  >60.00 mL/min Final  . Hgb A1c MFr Bld 05/23/2018 7.3* 4.6 - 6.5 % Final   Glycemic Control Guidelines for People with Diabetes:Non Diabetic:  <6%Goal of Therapy: <7%Additional Action Suggested:  >8%      Allergies as of 05/28/2018      Reactions   Statins Other (See Comments)   Leg pain & severe fatigue      Medication List        Accurate as of 05/28/18  9:08 AM. Always use your most recent med list.          ARMOUR THYROID 60 MG tablet Generic drug:  thyroid Take 60 mg by mouth  daily.   Cod Liver Oil 1000 MG Caps Take 2,000 mg by mouth daily.   CoQ10 200 MG Caps Take 200 mg by mouth daily.   ELIQUIS 5 MG Tabs tablet Generic drug:  apixaban TAKE 1 TABLET BY MOUTH TWICE A DAY   FREESTYLE LIBRE READER Devi 1 Device by Does not apply route as directed.   FREESTYLE LIBRE SENSOR SYSTEM Misc 1 strip by Does not apply route once a week. Apply to upper arm and change sensor every 10 days. Dx Code E10.9   glucosamine-chondroitin 500-400 MG tablet Take 1 tablet daily by mouth.   glucose blood test strip Use asto check blood sugar 5 times a day   GLUTATHIONE PO Take 15 mLs daily by mouth.   insulin aspart 100 UNIT/ML injection Commonly known as:  novoLOG BASAL RATE 0.9 7AM-12, 0.8 NOON-7 AM. MAX 60 UNITS SUBCUTANEOUSLY EVERY DAY WITH PUMP PER MD   losartan 25 MG tablet Commonly known as:  COZAAR Take 25 mg by mouth daily.   Menaquinone-7 100 MCG Caps Take 150 mcg by mouth daily.   NON FORMULARY Systemic Formulas Pancreas supplement, 1 per day "                    "           Metabo-Shake (Glycemic Support) 1 scoop per day Reishi w/ Curcumin 1 per day   NON FORMULARY Systemic Formulas Glycemic Balance, 2 per day "                  "             Eyes supplement, 2 per day "                  "              ROX (Antioxidant w/ Reservatrol), 1 per day   NON FORMULARY Take 15 mLs daily by mouth. MAG CAL VANILLA LIQUID   NON FORMULARY Take 1 tablet 2 (two) times daily by mouth. BioFunction "I"   NON FORMULARY Take 1 tablet daily by mouth. Reishi w/ Curcuma   PROGESTERONE EX Apply 1 ml to skin daily   RESVERATROL PO Take 250 mg by mouth daily.   SUPER OMEGA-3 PO Take by mouth. Takes 2 capsules per day   Turmeric Curcumin 500 MG Caps  Take 1 tablet by mouth 3 (three) times daily.   Vitamin B-12 5000 MCG Subl Place 5,000 mcg under the tongue daily.   vitamin C 1000 MG tablet Take 1,000 mg by mouth daily.   VITAMIN D PO Take 5,000 mcg by  mouth daily.       Allergies:  Allergies  Allergen Reactions  . Statins Other (See Comments)    Leg pain & severe fatigue    Past Medical History:  Diagnosis Date  . Anxiety    new dx  . Atrial fibrillation ([place])    a. 03/2015 s/p RFCA;  b. CHA2DS2VASc = 5-->Eliquis.  . 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 Diabetes mellitus type 1 (HCC)    Insulin pump  . Diastolic CHF ([place])    a. 12/1495 - due to AF RVR;  b. 03/2015 Echo: EF 60-65%, no rwma.  . Hyperlipidemia   . Hypertension   . Hypothyroidism   . Insulin pump in place   . Ovarian cyst   . S/P radiation therapy 04/10/12- 05/26/2012   left Breast and Axilla / 50 gy / 25 Fractions with Left Breast Boost / 10 Gy / 5 Fractions  . SIADH (syndrome of inappropriate ADH production) ([place])   . Use of letrozole (F[facility]) 06/13/12    Past Surgical History:  Procedure Laterality Date  . APPENDECTOMY    . 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 COLONOSCOPY     1 polyp   . ELECTROPHYSIOLOGIC STUDY N/A 04/08/2015   PVI + CTI ablation + posterior Box lesion + Atach ablation by [name]  . OVARIAN CYST SURGERY    . TEE WITHOUT CARDIOVERSION N/A 04/07/2015   Procedure: TRANSESOPHAGEAL ECHOCARDIOGRAM (TEE);  Surgeon: Thayer Headings, MD;  Location: [facility] ENDOSCOPY;  Service: Cardiovascular;  Laterality: N/A;  . TONSILLECTOMY      Family History  Problem Relation Age of Onset  . Breast cancer Mother 52  . Heart failure Mother   . Prostate cancer Maternal Uncle        died in his 42s  . Heart attack Father   . Diabetes Maternal Uncle 15    Social History:  reports that she has never smoked. She has never used smokeless tobacco. She reports that she does not drink alcohol or use  drugs.  REVIEW of systems:   She has a history of mild hypothyroidism followed by a holistic physician Again she is getting stable controlled with a regimen of '45mg'$  Armour Thyroid  TSH normal     Lab Results  Component Value Date   TSH 1.56 02/19/2018    HYPERCHOLESTEROLEMIA: Her LDL has been persistently high and refuses to take a statin drug including pravastatin even though it had no side effects previously.  She was previously trying to use red rice yeast , none now Previously her LDL particle number is consistently high at 1621  She was given a prescription for Crestor which she thinks she tried for 3-4 weeks but could not continue because she felt it was giving her headaches and making her tired  She Continues to be using natural remedies and essential oils for her hypercholesterolemia She said that she is applying a concoction of several oils twice a day now on her abdominal skin  Her LDL is relatively improved now, previously as high as 194  Lab Results  Component Value Date   CHOL 203 (H) 02/19/2018   CHOL 213 (H) 12/12/2017   CHOL 254 (H) 09/13/2017   Lab Results  Component Value Date   HDL 57.60 02/19/2018   HDL 57.20 12/12/2017   HDL 60.40 09/13/2017   Lab Results  Component Value Date   LDLCALC 131 (H) 02/19/2018   LDLCALC 146 (H) 12/12/2017   LDLCALC 178 (H) 09/13/2017   Lab Results  Component Value Date   TRIG 72.0 02/19/2018   TRIG 51.0 12/12/2017   TRIG 77.0 09/13/2017   Lab Results  Component Value Date   CHOLHDL 4 02/19/2018   CHOLHDL 4 12/12/2017   CHOLHDL 4 09/13/2017   Lab Results  Component Value Date   LDLDIRECT 148.4 08/06/2013   . She takes numerous herbal supplements as before  No history of numbness or tingling in her feet    EXAM:  BP 110/80   Pulse 62   Ht '5\' 4"'$  (1.626 m)   Wt 156 lb 3.2 oz (70.9 kg)   SpO2 98%   BMI 26.81 kg/m    ASSESSMENT:  DIABETES on [employer] insulin pump:  See history of present  illness for detailed discussion of current management, blood sugar patterns and problems identified  Her A1c is going up to 7.3, was 7.6  Although her A1c is higher her recent blood sugars are not reflecting this is an average blood sugar for the last 2 weeks is only 132 HIGHEST blood sugars are in the afternoon related to either missing her lunchtime bolus or taking inadequate coverage for her lunch meal especially with eating more fruits without adequate protein She is frequently not entering carbohydrates at mealtimes especially at lunchtime Blood sugars are variable after breakfast and suppertime but not as consistently high Most likely needs a higher basal rate in the afternoon anyway She does have a tendency to low normal or low sugars right before suppertime at 6:00 as also on her sensor appears to have HYPOGLYCEMIA on some nights between 1 AM and 4 AM, this is asymptomatic   RECOMMENDATIONS:  She will make changes in management as above BASAL rate changes: Midnight = 0.3 2 PM = 0.95, 4 PM = 0.8 and 8 PM = 0.6 She needs to cover her carbohydrates at lunchtime more consistently and put an appropriate amount especially when eating fruits, currently not getting coverage for her high sugar fruit intake For this she will need to enter the carbohydrates while eating and use correction factor at that time also She will call if she has any low sugar To review blood sugar patterns after uploading her sensor in about 2 weeks She will talk to [place] for getting her program set up on her phone for the scanning and uploading process   HYPERTENSION:  well controlled However if she is planning to be active and outdoors in the heat she will need to skip her losartan so she does not have a low blood pressure episode  HYPERLIPIDEMIA: Again she does not want to take a statin drug, discussed that taking lipids may not be necessary now if we are not going to treat   HYPOTHYROIDISM:  Usually TSH normal on 45 mg Armour Thyroid and will need to recheck this again next time    Elayne Snare 05/28/2018, 9:08 AM

## 2018-05-28 ENCOUNTER — Encounter: Payer: Self-pay | Admitting: Endocrinology

## 2018-05-28 ENCOUNTER — Ambulatory Visit (INDEPENDENT_AMBULATORY_CARE_PROVIDER_SITE_OTHER): Payer: PPO | Admitting: Endocrinology

## 2018-05-28 VITALS — BP 110/80 | HR 62 | Ht 64.0 in | Wt 156.2 lb

## 2018-05-28 DIAGNOSIS — E1065 Type 1 diabetes mellitus with hyperglycemia: Secondary | ICD-10-CM

## 2018-05-28 DIAGNOSIS — E038 Other specified hypothyroidism: Secondary | ICD-10-CM

## 2018-06-10 ENCOUNTER — Telehealth: Payer: Self-pay | Admitting: Emergency Medicine

## 2018-06-10 NOTE — Telephone Encounter (Signed)
Pt called and asked to be transferred to your voicemail which was full. Pt called back and asked to send a message over to you stated she needs a refill on her Novolog insulin for her pump. Pharmacy is [facility] [place]. Thanks.

## 2018-06-10 NOTE — Telephone Encounter (Signed)
Gina [name], I can not send prescriptions.  My computer will do this.  Please forward to Dr. Ronnie Derby CMA??

## 2018-06-10 NOTE — Telephone Encounter (Signed)
Pt called and asked to speak to [facility] about refill. [name] stated she doesn't do the refills. Can patient please get a refill on her Novolog insulin for her pump. Pharmacy is [facility] [place]. Thanks.

## 2018-06-11 ENCOUNTER — Other Ambulatory Visit: Payer: Self-pay

## 2018-06-11 MED ORDER — INSULIN ASPART 100 UNIT/ML ~~LOC~~ SOLN: SUBCUTANEOUS | 5 refills | Status: DC

## 2018-06-11 NOTE — Telephone Encounter (Signed)
This has been done.

## 2018-06-23 DIAGNOSIS — E1065 Type 1 diabetes mellitus with hyperglycemia: Secondary | ICD-10-CM | POA: Diagnosis not present

## 2018-07-23 DIAGNOSIS — E1065 Type 1 diabetes mellitus with hyperglycemia: Secondary | ICD-10-CM | POA: Diagnosis not present

## 2018-07-24 DIAGNOSIS — E034 Atrophy of thyroid (acquired): Secondary | ICD-10-CM | POA: Diagnosis not present

## 2018-07-24 DIAGNOSIS — I10 Essential (primary) hypertension: Secondary | ICD-10-CM | POA: Diagnosis not present

## 2018-07-24 DIAGNOSIS — E785 Hyperlipidemia, unspecified: Secondary | ICD-10-CM | POA: Diagnosis not present

## 2018-07-24 DIAGNOSIS — N951 Menopausal and female climacteric states: Secondary | ICD-10-CM | POA: Diagnosis not present

## 2018-07-24 DIAGNOSIS — E1059 Type 1 diabetes mellitus with other circulatory complications: Secondary | ICD-10-CM | POA: Diagnosis not present

## 2018-07-30 DIAGNOSIS — E1059 Type 1 diabetes mellitus with other circulatory complications: Secondary | ICD-10-CM | POA: Diagnosis not present

## 2018-07-30 DIAGNOSIS — I10 Essential (primary) hypertension: Secondary | ICD-10-CM | POA: Diagnosis not present

## 2018-07-30 DIAGNOSIS — N951 Menopausal and female climacteric states: Secondary | ICD-10-CM | POA: Diagnosis not present

## 2018-07-30 DIAGNOSIS — E034 Atrophy of thyroid (acquired): Secondary | ICD-10-CM | POA: Diagnosis not present

## 2018-07-31 ENCOUNTER — Telehealth: Payer: Self-pay | Admitting: Dietician

## 2018-07-31 NOTE — Telephone Encounter (Signed)
Patient called asking how to link the [employer] to Dr. Ronnie Derby office. She has not yet been able to set up her own personal account as her laptop is down.   Instructed her to set up her account.   She will then need our practice ID number for [facility] which I provided. She can find the steps on how to link this on line.   Patient to call for any questions.  Antonieta Iba, RD, LDN, CDE

## 2018-08-10 ENCOUNTER — Other Ambulatory Visit: Payer: Self-pay | Admitting: Internal Medicine

## 2018-08-11 NOTE — Telephone Encounter (Signed)
Pt last saw [name] 08/26/17, has appt scheduled to see [name] 08/28/18.  Last labs 05/23/18 Creat 0.83, age 72, weight 70.9kg, based on specified criteria pt is on appropriate dosage of Eliquis 5mg  BID.  Will refill rx.

## 2018-08-25 DIAGNOSIS — E1065 Type 1 diabetes mellitus with hyperglycemia: Secondary | ICD-10-CM | POA: Diagnosis not present

## 2018-08-28 ENCOUNTER — Ambulatory Visit (HOSPITAL_COMMUNITY)
Admission: RE | Admit: 2018-08-28 | Discharge: 2018-08-28 | Disposition: A | Discharge status: Home / Self Care | Payer: PPO | Source: Ambulatory Visit | Attending: Nurse Practitioner | Admitting: Nurse Practitioner

## 2018-08-28 ENCOUNTER — Encounter (HOSPITAL_COMMUNITY): Payer: Self-pay | Admitting: Nurse Practitioner

## 2018-08-28 VITALS — BP 124/72 | HR 68 | Ht 64.0 in | Wt 153.0 lb

## 2018-08-28 DIAGNOSIS — Z7901 Long term (current) use of anticoagulants: Secondary | ICD-10-CM | POA: Insufficient documentation

## 2018-08-28 DIAGNOSIS — E109 Type 1 diabetes mellitus without complications: Secondary | ICD-10-CM | POA: Insufficient documentation

## 2018-08-28 DIAGNOSIS — I110 Hypertensive heart disease with heart failure: Secondary | ICD-10-CM | POA: Insufficient documentation

## 2018-08-28 DIAGNOSIS — Z853 Personal history of malignant neoplasm of breast: Secondary | ICD-10-CM | POA: Diagnosis not present

## 2018-08-28 DIAGNOSIS — I5032 Chronic diastolic (congestive) heart failure: Secondary | ICD-10-CM | POA: Diagnosis not present

## 2018-08-28 DIAGNOSIS — I480 Paroxysmal atrial fibrillation: Secondary | ICD-10-CM | POA: Diagnosis not present

## 2018-08-28 DIAGNOSIS — Z888 Allergy status to other drugs, medicaments and biological substances status: Secondary | ICD-10-CM | POA: Diagnosis not present

## 2018-08-28 DIAGNOSIS — E222 Syndrome of inappropriate secretion of antidiuretic hormone: Secondary | ICD-10-CM | POA: Diagnosis not present

## 2018-08-28 DIAGNOSIS — Z794 Long term (current) use of insulin: Secondary | ICD-10-CM | POA: Insufficient documentation

## 2018-08-28 DIAGNOSIS — E039 Hypothyroidism, unspecified: Secondary | ICD-10-CM | POA: Diagnosis not present

## 2018-08-28 DIAGNOSIS — F419 Anxiety disorder, unspecified: Secondary | ICD-10-CM | POA: Insufficient documentation

## 2018-08-28 DIAGNOSIS — E785 Hyperlipidemia, unspecified: Secondary | ICD-10-CM | POA: Diagnosis not present

## 2018-08-28 DIAGNOSIS — Z79899 Other long term (current) drug therapy: Secondary | ICD-10-CM | POA: Diagnosis not present

## 2018-08-28 NOTE — Progress Notes (Signed)
Primary Care Physician: Susy Frizzle, MD Referring Physician: Dr. Leonides Grills Gina [name] is a 72 y.o. female with a h/o paroxysmal  afib s/p ablation 04/08/15. She had done very well with no reoccurrence sustained afib.She is off antiarrythmic's and rate control for some time. She continue on eliquis without bleeding problems.   Today, she denies symptoms of palpitations, chest pain, shortness of breath, orthopnea, PND, lower extremity edema, dizziness, presyncope, syncope, or neurologic sequela. The patient is tolerating medications without difficulties and is otherwise without complaint today.   Past Medical History:  Diagnosis Date  . Anxiety    new dx  . Atrial fibrillation ([place])    a. 03/2015 s/p RFCA;  b. CHA2DS2VASc = 5-->Eliquis.  . 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 Diabetes mellitus type 1 (HCC)    Insulin pump  . Diastolic CHF ([place])    a. 06/1637 - due to AF RVR;  b. 03/2015 Echo: EF 60-65%, no rwma.  . Hyperlipidemia   . Hypertension   . Hypothyroidism   . Insulin pump in place   . Ovarian cyst   . S/P radiation therapy 04/10/12- 05/26/2012   left Breast and Axilla / 50 gy / 25 Fractions with Left Breast Boost / 10 Gy / 5 Fractions  . SIADH (syndrome of inappropriate ADH production) ([place])   . Use of letrozole ([place]) 06/13/12   Past Surgical History:  Procedure Laterality Date  . APPENDECTOMY    . 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 COLONOSCOPY     1 polyp   . ELECTROPHYSIOLOGIC STUDY N/A 04/08/2015   PVI + CTI ablation + posterior Box lesion + Atach ablation by [name]  . OVARIAN CYST SURGERY    . TEE WITHOUT CARDIOVERSION N/A 04/07/2015   Procedure: TRANSESOPHAGEAL ECHOCARDIOGRAM (TEE);   Surgeon: Thayer Headings, MD;  Location: [facility];  Service: Cardiovascular;  Laterality: N/A;  . TONSILLECTOMY      Current Outpatient Medications  Medication Sig Dispense Refill  . ARMOUR THYROID 60 MG tablet Take 60 mg by mouth daily.  1  . Ascorbic Acid (VITAMIN C) 1000 MG tablet Take 1,000 mg by mouth daily.     . ASHWAGANDHA PO Take 1 tablet by mouth daily.    . Cholecalciferol (VITAMIN D PO) Take 5,000 mcg by mouth daily.    Marland Kitchen Cod Liver Oil 1000 MG CAPS Take 2,000 mg by mouth daily.     . Coenzyme Q10 (COQ10) 200 MG CAPS Take 200 mg by mouth daily.    . Continuous Blood Gluc Receiver (FREESTYLE LIBRE READER) DEVI 1 Device by Does not apply route as directed. 1 Device 0  . Continuous Blood Gluc Sensor (FREESTYLE LIBRE SENSOR SYSTEM) MISC 1 strip by Does not apply route once a week. Apply to upper arm and change sensor every 10 days. Dx Code E10.9 9 each 3  . Cyanocobalamin (VITAMIN B-12) 5000 MCG SUBL Place 5,000 mcg under the tongue daily.    Marland Kitchen ELIQUIS 5 MG TABS tablet TAKE 1 TABLET BY MOUTH TWICE A DAY 60 tablet 6  . glucose blood (ONE TOUCH ULTRA TEST) test strip Use asto check blood sugar 5 times a day 200 each 5  . GLUTATHIONE PO Take 15 mLs daily by  mouth.    . insulin aspart (NOVOLOG) 100 UNIT/ML injection BASAL RATE 0.9 7AM-12, 0.8 NOON-7 AM. MAX 60 UNITS SUBCUTANEOUSLY EVERY DAY WITH PUMP PER MD 20 mL 5  . losartan (COZAAR) 25 MG tablet Take 25 mg by mouth daily.    . Menaquinone-7 100 MCG CAPS Take 150 mcg by mouth daily.    . NON FORMULARY Systemic Formulas Pancreas supplement, 1 per day "                    "           Metabo-Shake (Glycemic Support) 1 scoop per day Reishi w/ Curcumin 1 per day    . NON FORMULARY Systemic Formulas Glycemic Balance, 2 per day "                  "             Eyes supplement, 2 per day "                  "              ROX (Antioxidant w/ Reservatrol), 1 per day    . NON FORMULARY Take 15 mLs daily by mouth. MAG CAL VANILLA LIQUID    .  NON FORMULARY Take 1 tablet 2 (two) times daily by mouth. BioFunction "I"    . NON FORMULARY Take 1 tablet daily by mouth. Reishi w/ Curcuma    . NON FORMULARY 2 capsules daily. astachol ox    . NON FORMULARY 1 capsule. phosphatidylserine    . Omega-3 Fatty Acids (SUPER OMEGA-3 PO) Take by mouth. Takes 2 capsules per day    . RESVERATROL PO Take 250 mg by mouth daily.     . Turmeric Curcumin 500 MG CAPS Take 1 tablet by mouth 3 (three) times daily.     No current facility-administered medications for this encounter.    Facility-Administered Medications Ordered in Other Encounters  Medication Dose Route Frequency Provider Last Rate Last Dose  . topical emolient (BIAFINE) emulsion   Topical Daily Eppie Gibson, MD        Allergies  Allergen Reactions  . Statins Other (See Comments)    Leg pain & severe fatigue    Social History   Socioeconomic History  . Marital status: Married    Spouse name: Not on file  . Number of children: Not on file  . Years of education: Not on file  . Highest education level: Not on file  Occupational History  . Not on file  Social Needs  . Financial resource strain: Not on file  . Food insecurity:    Worry: Not on file    Inability: Not on file  . Transportation needs:    Medical: Not on file    Non-medical: Not on file  Tobacco Use  . Smoking status: Never Smoker  . Smokeless tobacco: Never Used  Substance and Sexual Activity  . Alcohol use: No  . Drug use: No  . Sexual activity: Yes  Lifestyle  . Physical activity:    Days per week: Not on file    Minutes per session: Not on file  . Stress: Not on file  Relationships  . Social connections:    Talks on phone: Not on file    Gets together: Not on file    Attends religious service: Not on file    Active member of club or organization: Not on file  Attends meetings of clubs or organizations: Not on file    Relationship status: Not on file  . Intimate partner violence:    Fear of  current or ex partner: Not on file    Emotionally abused: Not on file    Physically abused: Not on file    Forced sexual activity: Not on file  Other Topics Concern  . Not on file  Social History Narrative  . Not on file    Family History  Problem Relation Age of Onset  . Breast cancer Mother 49  . Heart failure Mother   . Prostate cancer Maternal Uncle        died in his 51s  . Heart attack Father   . Diabetes Maternal Uncle 15    ROS- All systems are reviewed and negative except as per the HPI above  Physical Exam: Vitals:   08/28/18 1015  BP: 124/72  Pulse: 68  Weight: 69.4 kg  Height: '5\' 4"'$  (1.626 m)   Wt Readings from Last 3 Encounters:  08/28/18 69.4 kg  05/28/18 70.9 kg  02/25/18 71.9 kg    Labs: Lab Results  Component Value Date   NA 133 (L) 05/23/2018   K 4.3 05/23/2018   CL 98 05/23/2018   CO2 28 05/23/2018   GLUCOSE 141 (H) 05/23/2018   BUN 10 05/23/2018   CREATININE 0.83 05/23/2018   CALCIUM 9.0 05/23/2018   MG 1.7 01/13/2015   Lab Results  Component Value Date   INR 0.98 01/11/2015   Lab Results  Component Value Date   CHOL 203 (H) 02/19/2018   HDL 57.60 02/19/2018   LDLCALC 131 (H) 02/19/2018   TRIG 72.0 02/19/2018     GEN- The patient is well appearing, alert and oriented x 3 today.   Head- normocephalic, atraumatic Eyes-  Sclera clear, conjunctiva pink Ears- hearing intact Oropharynx- clear Neck- supple, no JVP Lymph- no cervical lymphadenopathy Lungs- Clear to ausculation bilaterally, normal work of breathing Heart- Regular rate and rhythm, no murmurs, rubs or gallops, PMI not laterally displaced GI- soft, NT, ND, + BS Extremities- no clubbing, cyanosis, or edema MS- no significant deformity or atrophy Skin- no rash or lesion Psych- euthymic mood, full affect Neuro- strength and sensation are intact  EKG-NSR with sinus arrhythmia at 68 bpm PR int 146 ms, qrs int 76 ms, qtc 416 ms    Assessment and Plan: 1. Afib Very  quiet in the 3 years following ablation   2. CHA2DS2VASc score of at least 4 Appropriately dosed at 5 mg bid No bleeding issues  3. HTN Stable  4. DM Per pt controlled Per Dr. Dwyane Dee  F/u in one year with Dr. Rayann Heman afib clinic as needed  Geroge Baseman. [place], [facility] [address] [place], [place] [address] [phone]

## 2018-09-02 ENCOUNTER — Ambulatory Visit: Payer: PPO | Admitting: Endocrinology

## 2018-09-24 DIAGNOSIS — E1065 Type 1 diabetes mellitus with hyperglycemia: Secondary | ICD-10-CM | POA: Diagnosis not present

## 2018-10-13 NOTE — Progress Notes (Deleted)
error 

## 2018-10-14 ENCOUNTER — Ambulatory Visit: Payer: PPO | Admitting: Endocrinology

## 2018-10-14 ENCOUNTER — Other Ambulatory Visit (INDEPENDENT_AMBULATORY_CARE_PROVIDER_SITE_OTHER): Payer: PPO

## 2018-10-14 DIAGNOSIS — E038 Other specified hypothyroidism: Secondary | ICD-10-CM | POA: Diagnosis not present

## 2018-10-14 DIAGNOSIS — E1065 Type 1 diabetes mellitus with hyperglycemia: Secondary | ICD-10-CM | POA: Diagnosis not present

## 2018-10-14 LAB — COMPREHENSIVE METABOLIC PANEL
ALBUMIN: 4 g/dL (ref 3.5–5.2)
ALT: 22 U/L (ref 0–35)
AST: 24 U/L (ref 0–37)
Alkaline Phosphatase: 86 U/L (ref 39–117)
BUN: 9 mg/dL (ref 6–23)
CALCIUM: 8.8 mg/dL (ref 8.4–10.5)
CO2: 29 meq/L (ref 19–32)
Chloride: 98 mEq/L (ref 96–112)
Creatinine, Ser: 0.74 mg/dL (ref 0.40–1.20)
GFR: 81.47 mL/min (ref >60.00)
Glucose, Bld: 139 mg/dL — ABNORMAL HIGH (ref 70–99)
Potassium: 4.2 mEq/L (ref 3.5–5.1)
Sodium: 132 mEq/L — ABNORMAL LOW (ref 135–145)
Total Bilirubin: 0.5 mg/dL (ref 0.2–1.2)
Total Protein: 6.5 g/dL (ref 6.0–8.3)

## 2018-10-14 LAB — HEMOGLOBIN A1C: Hgb A1c MFr Bld: 7.8 % — ABNORMAL HIGH (ref 4.6–6.5)

## 2018-10-14 LAB — LIPID PANEL
Cholesterol: 230 mg/dL — ABNORMAL HIGH (ref 0–200)
HDL: 68.7 mg/dL (ref >39.00)
LDL CALC: 150 mg/dL — AB (ref 0–99)
NonHDL: 160.96
TRIGLYCERIDES: 57 mg/dL (ref 0.0–149.0)
Total CHOL/HDL Ratio: 3
VLDL: 11.4 mg/dL (ref 0.0–40.0)

## 2018-10-14 LAB — TSH: TSH: 1.26 u[IU]/mL (ref 0.35–4.50)

## 2018-10-14 LAB — T4, FREE: FREE T4: 1.1 ng/dL (ref 0.60–1.60)

## 2018-10-16 ENCOUNTER — Ambulatory Visit: Payer: PPO | Admitting: Endocrinology

## 2018-10-16 NOTE — Progress Notes (Deleted)
12  

## 2018-10-29 DIAGNOSIS — E1065 Type 1 diabetes mellitus with hyperglycemia: Secondary | ICD-10-CM | POA: Diagnosis not present

## 2018-11-03 DIAGNOSIS — E119 Type 2 diabetes mellitus without complications: Secondary | ICD-10-CM | POA: Diagnosis not present

## 2018-11-05 ENCOUNTER — Telehealth: Payer: Self-pay | Admitting: Endocrinology

## 2018-11-05 NOTE — Telephone Encounter (Signed)
Patient  is requesting a call back. Please Advise, thanks

## 2018-11-05 NOTE — Telephone Encounter (Signed)
Patient reported that her blood sugars started to rise last night at 5AM from 156 at HS to 283.  She bolused at 5AM, and at 6:30 it was down to 258. She did not validate this with a fingerstick, and denies feeling ill, or having had a bad dream.  Also says it was not an infusion set issue, because blood sugar readings had come down to 140 at 8;30.   Told her that it may have been a one time thing, and to watch this over the next few days.  If it continues, to call the reading to Dr. Ronnie Derby office on Monday.  She agreed to do this.

## 2018-11-11 ENCOUNTER — Encounter: Payer: Self-pay | Admitting: Endocrinology

## 2018-11-11 ENCOUNTER — Ambulatory Visit (INDEPENDENT_AMBULATORY_CARE_PROVIDER_SITE_OTHER): Payer: PPO | Admitting: Endocrinology

## 2018-11-11 VITALS — BP 140/72 | HR 69 | Ht 64.0 in | Wt 155.0 lb

## 2018-11-11 DIAGNOSIS — E1065 Type 1 diabetes mellitus with hyperglycemia: Secondary | ICD-10-CM

## 2018-11-11 DIAGNOSIS — E038 Other specified hypothyroidism: Secondary | ICD-10-CM | POA: Diagnosis not present

## 2018-11-11 DIAGNOSIS — E7800 Pure hypercholesterolemia, unspecified: Secondary | ICD-10-CM | POA: Diagnosis not present

## 2018-11-11 NOTE — Patient Instructions (Signed)
Bolus before the meal

## 2018-11-11 NOTE — Progress Notes (Signed)
Patient ID: Gina [name], female   DOB: August 14, 1946, 73 y.o.   MRN: [id number]   Reason for Appointment: Insulin Pump followup:   History of Present Illness   Diagnosis: Type 1 DIABETES MELITUS, date of diagnosis:  1984     CURRENT insulin pump:  Minimed 670 has been on a pump since 1995  HISTORY:    The pump SETTINGS are: Midnight = 0. 375, 4 AM 0. 875.  6 AM = 0.95, 8:30 AM = 1.15, 11:30 AM = 1.1;, 4PM = 0.95 and 6 PM = 0.8, 11 PM 0.7 Total basal insulin 20 units  Carbohydrate ratio 1: 8 before 12 noon, 1:5 for lunch and 1: 8 After 5 PM Blood sugar target 110 .  Correction factor 1: 50   A1c is now relatively higher at 7.8 and appears to be progressively getting up   Current management, blood sugar patterns and problems identified:    Her pump download and freestyle libre were reviewed in detail  Although recently she has had an excellent average on her freestyle CGM for 2 weeks not clear why her A1c has gone up  She is again having similar problems as on the last visit  Despite reducing her basal rate late at night she is having a tendency to relatively low sugars after midnight  Also occasionally in the morning may have readings in the 50s with minimal symptom  She is not confirming that her sugar is low with the fingerstick going ahead and treating  Although she claims that she is eating very low carbohydrate intake at all meals she does have periodic postprandial spikes in her blood sugars at lunch or dinner  Also appears that she may be bolusing only after eating instead of before eating when she is needing to cover carbohydrates  She has had a carbohydrate coverage of 1: 5 at lunch and not clear why  In the last 2 weeks she has entered carbohydrates only 4 times in her pump  Because of her low basal rates and insulin requirements she is only changing her infusion set every 10 days or so when her insulin runs out  She has had a couple of nights  where her blood sugars have unexpectedly gone up significantly and she does not know why   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  GLUCOSE CONTROL with the pump is assessed today by pump and freestyle [employer]  Blood sugar data from freestyle [place] download:  CGM use % of time  95  2-week average/SD  116+/-42  Time in range      83 %  % Time Above 180  9  % Time above 250   % Time Below 70  8     PRE-MEAL Fasting Lunch Dinner Bedtime Overall  Glucose range:       Averages:  109  107  125  105  116   POST-MEAL PC Breakfast PC Lunch PC Dinner  Glucose range:     Averages:   145  138   12 AM-2 AM average = 88  PREVIOUS readings:  PRE-MEAL Fasting Lunch Dinner Bedtime Overall  Glucose range:       Mean/median:  122  137  127  131   132+/-46   POST-MEAL PC Breakfast PC Lunch PC Dinner  Glucose range:     Mean/median:  151  160  152     Wt Readings from Last 3 Encounters:  11/11/18 155 lb (70.3  kg)  08/28/18 153 lb (69.4 kg)  05/28/18 156 lb 3.2 oz (70.9 kg)    LABS:   Lab Results  Component Value Date   HGBA1C 7.8 (H) 10/14/2018   HGBA1C 7.3 (H) 05/23/2018   HGBA1C 6.6 (H) 02/19/2018   Lab Results  Component Value Date   MICROALBUR 0.9 02/19/2018   LDLCALC 150 (H) 10/14/2018   CREATININE 0.74 10/14/2018    Other problems discussed today: Review of systems  No visits with results within 1 Week(s) from this visit.  Latest known visit with results is:  Lab on 10/14/2018  Component Date Value Ref Range Status  . Free T4 10/14/2018 1.10  0.60 - 1.60 ng/dL Final   Comment: Specimens from patients who are undergoing biotin therapy and /or ingesting biotin supplements may contain high levels of biotin.  The higher biotin concentration in these specimens interferes with this Free T4 assay.  Specimens that contain high levels  of biotin may cause false high results for this Free T4 assay.  Please interpret results in light of the total clinical  presentation of the patient.    Marland Kitchen TSH 10/14/2018 1.26  0.35 - 4.50 uIU/mL Final  . Cholesterol 10/14/2018 230* 0 - 200 mg/dL Final   ATP III Classification       Desirable:  < 200 mg/dL               Borderline High:  200 - 239 mg/dL          High:  > = 240 mg/dL  . Triglycerides 10/14/2018 57.0  0.0 - 149.0 mg/dL Final   Normal:  <150 mg/dLBorderline High:  150 - 199 mg/dL  . HDL 10/14/2018 68.70  >39.00 mg/dL Final  . VLDL 10/14/2018 11.4  0.0 - 40.0 mg/dL Final  . LDL Cholesterol 10/14/2018 150* 0 - 99 mg/dL Final  . Total CHOL/HDL Ratio 10/14/2018 3   Final                  Men          Women1/2 Average Risk     3.4          3.3Average Risk          5.0          4.42X Average Risk          9.6          7.13X Average Risk          15.0          11.0                      . NonHDL 10/14/2018 160.96   Final   NOTE:  Non-HDL goal should be 30 mg/dL higher than patient's LDL goal (i.e. LDL goal of < 70 mg/dL, would have non-HDL goal of < 100 mg/dL)  . Sodium 10/14/2018 132* 135 - 145 mEq/L Final  . Potassium 10/14/2018 4.2  3.5 - 5.1 mEq/L Final  . Chloride 10/14/2018 98  96 - 112 mEq/L Final  . CO2 10/14/2018 29  19 - 32 mEq/L Final  . Glucose, Bld 10/14/2018 139* 70 - 99 mg/dL Final  . BUN 10/14/2018 9  6 - 23 mg/dL Final  . Creatinine, Ser 10/14/2018 0.74  0.40 - 1.20 mg/dL Final  . Total Bilirubin 10/14/2018 0.5  0.2 - 1.2 mg/dL Final  . Alkaline Phosphatase 10/14/2018 86  39 - 117 U/L Final  .  AST 10/14/2018 24  0 - 37 U/L Final  . ALT 10/14/2018 22  0 - 35 U/L Final  . Total Protein 10/14/2018 6.5  6.0 - 8.3 g/dL Final  . Albumin 10/14/2018 4.0  3.5 - 5.2 g/dL Final  . Calcium 10/14/2018 8.8  8.4 - 10.5 mg/dL Final  . GFR 10/14/2018 81.47  >60.00 mL/min Final  . Hgb A1c MFr Bld 10/14/2018 7.8* 4.6 - 6.5 % Final   Glycemic Control Guidelines for People with Diabetes:Non Diabetic:  <6%Goal of Therapy: <7%Additional Action Suggested:  >8%      Allergies as of 11/11/2018       Reactions   Statins Other (See Comments)   Leg pain & severe fatigue      Medication List       Accurate as of November 11, 2018  9:39 AM. Always use your most recent med list.        ARMOUR THYROID 60 MG tablet Generic drug:  thyroid Take 60 mg by mouth daily.   ASHWAGANDHA PO Take 1 tablet by mouth daily.   Cod Liver Oil 1000 MG Caps Take 2,000 mg by mouth daily.   CoQ10 200 MG Caps Take 200 mg by mouth daily.   ELIQUIS 5 MG Tabs tablet Generic drug:  apixaban TAKE 1 TABLET BY MOUTH TWICE A DAY   FREESTYLE LIBRE READER Devi 1 Device by Does not apply route as directed.   FREESTYLE LIBRE SENSOR SYSTEM Misc 1 strip by Does not apply route once a week. Apply to upper arm and change sensor every 10 days. Dx Code E10.9   glucose blood test strip Commonly known as:  ONE TOUCH ULTRA TEST Use asto check blood sugar 5 times a day   GLUTATHIONE PO Take 15 mLs daily by mouth.   insulin aspart 100 UNIT/ML injection Commonly known as:  NOVOLOG BASAL RATE 0.9 7AM-12, 0.8 NOON-7 AM. MAX 60 UNITS SUBCUTANEOUSLY EVERY DAY WITH PUMP PER MD   losartan 25 MG tablet Commonly known as:  COZAAR Take 25 mg by mouth daily.   Menaquinone-7 100 MCG Caps Take 150 mcg by mouth daily.   NON FORMULARY Systemic Formulas Pancreas supplement, 1 per day "                    "           Metabo-Shake (Glycemic Support) 1 scoop per day Reishi w/ Curcumin 1 per day   NON FORMULARY Systemic Formulas Glycemic Balance, 2 per day "                  "             Eyes supplement, 2 per day "                  "              ROX (Antioxidant w/ Reservatrol), 1 per day   NON FORMULARY Take 15 mLs daily by mouth. MAG CAL VANILLA LIQUID   NON FORMULARY Take 1 tablet 2 (two) times daily by mouth. BioFunction "I"   NON FORMULARY Take 1 tablet daily by mouth. Reishi w/ Curcuma   NON FORMULARY 2 capsules daily. astachol ox   NON FORMULARY 1 capsule. phosphatidylserine   RESVERATROL PO Take  250 mg by mouth daily.   SUPER OMEGA-3 PO Take by mouth. Takes 2 capsules per day   Turmeric Curcumin 500 MG Caps Take 1 tablet by  mouth 3 (three) times daily.   Vitamin B-12 5000 MCG Subl Place 5,000 mcg under the tongue daily.   vitamin C 1000 MG tablet Take 1,000 mg by mouth daily.   VITAMIN D PO Take 5,000 mcg by mouth daily.       Allergies:  Allergies  Allergen Reactions  . Statins Other (See Comments)    Leg pain & severe fatigue    Past Medical History:  Diagnosis Date  . Anxiety    new dx  . Atrial fibrillation ([place])    a. 03/2015 s/p RFCA;  b. CHA2DS2VASc = 5-->Eliquis.  . 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 Diabetes mellitus type 1 ([facility])    Insulin pump  . Diastolic CHF ([place])    a. 12/5699 - due to AF RVR;  b. 03/2015 Echo: EF 60-65%, no rwma.  . Hyperlipidemia   . Hypertension   . Hypothyroidism   . Insulin pump in place   . Ovarian cyst   . S/P radiation therapy 04/10/12- 05/26/2012   left Breast and Axilla / 50 gy / 25 Fractions with Left Breast Boost / 10 Gy / 5 Fractions  . SIADH (syndrome of inappropriate ADH production) ([place])   . Use of letrozole ([place]) 06/13/12    Past Surgical History:  Procedure Laterality Date  . APPENDECTOMY    . 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 COLONOSCOPY     1 polyp   . ELECTROPHYSIOLOGIC STUDY N/A 04/08/2015   PVI + CTI ablation + posterior Box lesion + Atach ablation by [name]  . OVARIAN CYST SURGERY    . TEE WITHOUT CARDIOVERSION N/A 04/07/2015   Procedure: TRANSESOPHAGEAL ECHOCARDIOGRAM (TEE);  Surgeon: Thayer Headings, MD;  [facility] END[facility]COPY;  Service: Cardiovascular;  Laterality: N/A;  . TONSILLECTOMY      Family History  Problem  Relation Age of Onset  . Breast cancer Mother 83  . Heart failure Mother   . Prostate cancer Maternal Uncle        died in his 67s  . Heart attack Father   . Diabetes Maternal Uncle 15    Social History:  reports that she has never smoked. She has never used smokeless tobacco. She reports that she does not drink alcohol or use drugs.  REVIEW of systems:   She has a history of mild hypothyroidism followed by a holistic physician Again she is getting stable controlled with a regimen of 71m Armour Thyroid  TSH normal     Lab Results  Component Value Date   TSH 1.26 10/14/2018    HYPERCHOLESTEROLEMIA: Her LDL has been persistently high and refuses to take a statin drug including pravastatin even though it had no side effects previously.  She was previously trying to use red rice yeast , none now Previously her LDL particle number is consistently high at 1621  She was given a prescription for Crestor which she tried for 3-4 weeks but did not continue because she felt it was giving her headaches and making her tired  Again she wants to continue using natural substances and essential oils for her hypercholesterolemia She said that she is applying a concoction of several oils twice a day on her abdominal skin  Her LDL is relatively higher at  150   Lab Results  Component Value Date   CHOL 230 (H) 10/14/2018   CHOL 203 (H) 02/19/2018   CHOL 213 (H) 12/12/2017   Lab Results  Component Value Date   HDL 68.70 10/14/2018   HDL 57.60 02/19/2018   HDL 57.20 12/12/2017   Lab Results  Component Value Date   LDLCALC 150 (H) 10/14/2018   LDLCALC 131 (H) 02/19/2018   LDLCALC 146 (H) 12/12/2017   Lab Results  Component Value Date   TRIG 57.0 10/14/2018   TRIG 72.0 02/19/2018   TRIG 51.0 12/12/2017   Lab Results  Component Value Date   CHOLHDL 3 10/14/2018   CHOLHDL 4 02/19/2018   CHOLHDL 4 12/12/2017   Lab Results  Component Value Date   LDLDIRECT 148.4 08/06/2013    . She takes numerous herbal supplements as before      EXAM:  BP 140/72 (BP Location: Right Arm, Patient Position: Sitting, Cuff Size: Normal)   Pulse 69   Ht _0  (1.626 m)   Wt 155 lb (70.3 kg)   SpO2 96%   BMI 26.61 kg/m    ASSESSMENT:  DIABETES on Medtronic insulin pump:  See history of present illness for detailed discussion of current management, blood sugar patterns and problems identified  Her A1c is going up gradually and now 7.8  Although her A1c is higher as before her freestyle libre average recently is fairly good Not clear if some of her readings on the freestyle libre are falsely low She does have hypoglycemia periodically overnight without much symptoms Also her management with the pump was discussed in detail She does need to do better with bolusing before eating Also explained to her that if she is not changing her infusion set for 10 days she will have decreased insulin action with the insulin sticking to the tubing Postprandial hypoglycemia occurs mostly when she is not covering her meals adequately and waiting till after eating to bolus   RECOMMENDATIONS:  She will make showed her and change her infusion set every 5 days She will need to bolus every time she is eating carbohydrates and to do this before eating rather than wait till the blood sugar goes up  BASAL rate changes: Midnight = 0.25.  6 AM = 0.95 and 10 PM = 0.55 Carbohydrate coverage 1: 8 at all meals She will make sure that she is correlating her fingersticks with the freestyle libre periodically To count carbohydrates accurately at all meals Discussed balanced meals and continuing to have some carbohydrate for balanced nutrition  She can discuss some of the management changes and questions with the nurse educator today  HYPERTENSION:  well controlled   HYPERLIPIDEMIA: LDL is higher than target and this is despite her trying OTC remedies Again she does not want to take a statin  drug. If her LDL is further increased on the next visit will consider a trial of pravastatin   HYPOTHYROIDISM: Followed elsewhere and now taking 60 mg Armour Thyroid with normal TSH  Follow-up in 3 months  Counseling time on subjects discussed in assessment and plan sections is over 50% of today's 25 minute visit   Elayne Snare 11/11/2018, 9:39 AM

## 2018-11-12 ENCOUNTER — Telehealth: Payer: Self-pay | Admitting: Nutrition

## 2018-11-12 NOTE — Telephone Encounter (Signed)
Discussed why she needed to fill her cartridge and change her infusion set q 5 days, and she reported good understanding of this.  She agreed to fill the cartridge with only 150 units, and to change the infusion set with each cartridge change.

## 2018-11-13 DIAGNOSIS — H2513 Age-related nuclear cataract, bilateral: Secondary | ICD-10-CM | POA: Diagnosis not present

## 2018-11-13 DIAGNOSIS — E119 Type 2 diabetes mellitus without complications: Secondary | ICD-10-CM | POA: Diagnosis not present

## 2018-11-13 LAB — HM DIABETES EYE EXAM

## 2018-11-14 ENCOUNTER — Telehealth: Payer: Self-pay | Admitting: Dietician

## 2018-11-14 NOTE — Telephone Encounter (Signed)
Brief Nutrition Note Returned patient call. Patient called stating that she has been checking her BG on a meter and this is reading 15-55 points higher than the [employer].  She states that this must be why her A1C was so high this last time.  She states that she has not been entering carbs into her pump because she is not eating carbs with all meals as she wants to lose weight and control her BG. She has just been entering her glucose into the pump to control for high numbers.  Discussed that she needs carbs with all meals.  For weight loss she can aim for 30 grams per meal.  Reiterated that she should enter the carbs prior to her meal.  Encouraged her to call for further questions and to call if she would like an appointment with [name] or myself.  Antonieta Iba, RD, LDN, CDE

## 2018-11-21 ENCOUNTER — Telehealth: Payer: Self-pay | Admitting: Endocrinology

## 2018-11-21 NOTE — Telephone Encounter (Signed)
Once this paperwork is filled out, it will be sent back.

## 2018-11-21 NOTE — Telephone Encounter (Signed)
Rylee from [employer] to LMN and most recent visit notes for this patient

## 2018-11-24 ENCOUNTER — Encounter: Payer: Self-pay | Admitting: *Deleted

## 2018-11-30 ENCOUNTER — Other Ambulatory Visit: Payer: Self-pay | Admitting: Endocrinology

## 2018-12-01 ENCOUNTER — Telehealth: Payer: Self-pay | Admitting: Endocrinology

## 2018-12-01 NOTE — Telephone Encounter (Signed)
Paperwork was faxed on 12/01/2018

## 2018-12-01 NOTE — Telephone Encounter (Signed)
[employer] is calling to follow up on paper work that was suppose to be faxed back over to them    [employer]- [phone] Ex [phone]

## 2018-12-04 DIAGNOSIS — E1065 Type 1 diabetes mellitus with hyperglycemia: Secondary | ICD-10-CM | POA: Diagnosis not present

## 2018-12-11 ENCOUNTER — Other Ambulatory Visit: Payer: Self-pay

## 2018-12-11 ENCOUNTER — Other Ambulatory Visit: Payer: Self-pay | Admitting: Endocrinology

## 2018-12-11 MED ORDER — INSULIN ASPART 100 UNIT/ML ~~LOC~~ SOLN: SUBCUTANEOUS | 1 refills | Status: DC

## 2019-02-09 ENCOUNTER — Other Ambulatory Visit: Payer: PPO

## 2019-02-11 ENCOUNTER — Ambulatory Visit: Payer: PPO | Admitting: Endocrinology

## 2019-02-11 ENCOUNTER — Telehealth: Payer: Self-pay

## 2019-02-11 NOTE — Telephone Encounter (Signed)
Received fax from pt's insurance stating that she has been approved for ext ambulatory infusion insulin pump. Approval good from 01/28/2019 through 02/26/2019

## 2019-03-02 ENCOUNTER — Other Ambulatory Visit (INDEPENDENT_AMBULATORY_CARE_PROVIDER_SITE_OTHER): Payer: PPO

## 2019-03-02 ENCOUNTER — Other Ambulatory Visit: Payer: Self-pay

## 2019-03-02 DIAGNOSIS — E7800 Pure hypercholesterolemia, unspecified: Secondary | ICD-10-CM | POA: Diagnosis not present

## 2019-03-02 DIAGNOSIS — E1065 Type 1 diabetes mellitus with hyperglycemia: Secondary | ICD-10-CM

## 2019-03-02 DIAGNOSIS — E038 Other specified hypothyroidism: Secondary | ICD-10-CM | POA: Diagnosis not present

## 2019-03-02 LAB — HEMOGLOBIN A1C: Hgb A1c MFr Bld: 7 % — ABNORMAL HIGH (ref 4.6–6.5)

## 2019-03-02 LAB — LIPID PANEL
Cholesterol: 240 mg/dL — ABNORMAL HIGH (ref 0–200)
HDL: 72.1 mg/dL (ref >39.00)
LDL Cholesterol: 156 mg/dL — ABNORMAL HIGH (ref 0–99)
NonHDL: 167.81
Total CHOL/HDL Ratio: 3
Triglycerides: 59 mg/dL (ref 0.0–149.0)
VLDL: 11.8 mg/dL (ref 0.0–40.0)

## 2019-03-02 LAB — COMPREHENSIVE METABOLIC PANEL
ALT: 22 U/L (ref 0–35)
AST: 25 U/L (ref 0–37)
Albumin: 3.8 g/dL (ref 3.5–5.2)
Alkaline Phosphatase: 74 U/L (ref 39–117)
BUN: 8 mg/dL (ref 6–23)
CO2: 30 mEq/L (ref 19–32)
Calcium: 8.9 mg/dL (ref 8.4–10.5)
Chloride: 97 mEq/L (ref 96–112)
Creatinine, Ser: 0.74 mg/dL (ref 0.40–1.20)
GFR: 76.57 mL/min (ref >60.00)
Glucose, Bld: 159 mg/dL — ABNORMAL HIGH (ref 70–99)
Potassium: 4.6 mEq/L (ref 3.5–5.1)
Sodium: 132 mEq/L — ABNORMAL LOW (ref 135–145)
Total Bilirubin: 0.6 mg/dL (ref 0.2–1.2)
Total Protein: 6.6 g/dL (ref 6.0–8.3)

## 2019-03-02 LAB — T4, FREE: Free T4: 1.05 ng/dL (ref 0.60–1.60)

## 2019-03-02 LAB — T3, FREE: T3, Free: 4.1 pg/mL (ref 2.3–4.2)

## 2019-03-02 LAB — TSH: TSH: 1.57 u[IU]/mL (ref 0.35–4.50)

## 2019-03-03 NOTE — Progress Notes (Signed)
Patient ID: Gina [name], female   DOB: 1946/05/27, 73 y.o.   MRN: [id number]   Today's office visit was provided via telemedicine using video technique Explained to the patient and the the limitations of evaluation and management by telemedicine and the availability of in person appointments.  The patient understood the limitations and agreed to proceed. Patient also understood that the telehealth visit is billable. . Location of the patient: Home . Location of the provider: Office Only the patient and myself were participating in the encounter    Reason for Appointment: Insulin Pump followup:   History of Present Illness   Diagnosis: Type 1 DIABETES MELITUS, date of diagnosis:  1984     CURRENT insulin pump:  Minimed 670 has been on a pump since 1995  HISTORY:    The pump SETTINGS are: Midnight = 0.4, 4 AM 1.15.  6 AM = 0.95, 8:30 AM = 1.05, 11:30 AM = 1.0;, 4PM = 0. 9 0 and 6 PM = 0.8, 10 PM 0.55 Total basal insulin 20 units  Carbohydrate ratio 1: 8 before 12 noon, 1:5 for lunch and 1: 8 After 5 PM Blood sugar target 110 .  Correction factor 1: 50    A1c is now surprisingly improved at 7% even though it was previously higher at 7.8   Current management, blood sugar patterns and problems identified:    Freestyle libre was discontinued because of falsely low readings  Although she was supposed to start on the Dexcom sensor and a T-Slim pump she has not received these  She has checked her blood sugars somewhat erratically and not always at the time of her meals  Hyperglycemia is present frequently in the afternoons and evenings although erratically  She is overriding her bolus about two thirds of the time  She thinks that when she is planning to be more active she will cut back on her boluses and otherwise may actually increase her bolus at times  However he still appears that she is not always bolusing around the usual mealtimes especially dinnertime  and most of her readings late at night are high  She also may not be putting in the full amount of carbohydrates when she is eating seen by someone for high readings  She thinks that she is eating a lot of strawberries making her sugars go up  fasting blood sugars are not checked very much but appear to be fairly good especially with going up on her 4 AM basal rate  As usual [place]  Her weight is about the same  Does not think she has hypoglycemia  This is despite her going up slightly on her basal rate at midnight Although she thinks she is changing her infusion set every 5 days this is not apparent on her download and probably changing it only once a week despite using small amount of insulin for cartridge refills   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  GLUCOSE CONTROL with the pump is assessed today by pump and freestyle [employer]   PRE-MEAL Fasting Lunch Dinner Bedtime Overall  Glucose range:  106-222  90-245  77-295    Mean/median:      179   POST-MEAL PC Breakfast PC Lunch PC Dinner  Glucose range:    71-321  Mean/median:       PREVIOUS readings from freestyle libre:   PRE-MEAL Fasting Lunch Dinner Bedtime Overall  Glucose range:  Averages:  109  107  125  105  116   POST-MEAL PC Breakfast PC Lunch PC Dinner  Glucose range:     Averages:   145  138      Wt Readings from Last 3 Encounters:  11/11/18 155 lb (70.3 kg)  08/28/18 153 lb (69.4 kg)  05/28/18 156 lb 3.2 oz (70.9 kg)    LABS:   Lab Results  Component Value Date   HGBA1C 7.0 (H) 03/02/2019   HGBA1C 7.8 (H) 10/14/2018   HGBA1C 7.3 (H) 05/23/2018   Lab Results  Component Value Date   MICROALBUR 0.9 02/19/2018   LDLCALC 156 (H) 03/02/2019   CREATININE 0.74 03/02/2019    Other problems discussed today: Review of systems  Lab on 03/02/2019  Component Date Value Ref Range Status  . T3, Free 03/02/2019 4.1  2.3 - 4.2 pg/mL Final  . Free T4  03/02/2019 1.05  0.60 - 1.60 ng/dL Final   Comment: Specimens from patients who are undergoing biotin therapy and /or ingesting biotin supplements may contain high levels of biotin.  The higher biotin concentration in these specimens interferes with this Free T4 assay.  Specimens that contain high levels  of biotin may cause false high results for this Free T4 assay.  Please interpret results in light of the total clinical presentation of the patient.    Marland Kitchen TSH 03/02/2019 1.57  0.35 - 4.50 uIU/mL Final  . Cholesterol 03/02/2019 240* 0 - 200 mg/dL Final   ATP III Classification       Desirable:  < 200 mg/dL               Borderline High:  200 - 239 mg/dL          High:  > = 240 mg/dL  . Triglycerides 03/02/2019 59.0  0.0 - 149.0 mg/dL Final   Normal:  <150 mg/dLBorderline High:  150 - 199 mg/dL  . HDL 03/02/2019 72.10  >39.00 mg/dL Final  . VLDL 03/02/2019 11.8  0.0 - 40.0 mg/dL Final  . LDL Cholesterol 03/02/2019 156* 0 - 99 mg/dL Final  . Total CHOL/HDL Ratio 03/02/2019 3   Final                  Men          Women1/2 Average Risk     3.4          3.3Average Risk          5.0          4.42X Average Risk          9.6          7.13X Average Risk          15.0          11.0                      . NonHDL 03/02/2019 167.81   Final   NOTE:  Non-HDL goal should be 30 mg/dL higher than patient's LDL goal (i.e. LDL goal of < 70 mg/dL, would have non-HDL goal of < 100 mg/dL)  . Sodium 03/02/2019 132* 135 - 145 mEq/L Final  . Potassium 03/02/2019 4.6  3.5 - 5.1 mEq/L Final  . Chloride 03/02/2019 97  96 - 112 mEq/L Final  . CO2 03/02/2019 30  19 - 32 mEq/L Final  . Glucose, Bld 03/02/2019 159* 70 - 99 mg/dL Final  . BUN 03/02/2019 8  6 - 23 mg/dL Final  . Creatinine, Ser 03/02/2019 0.74  0.40 - 1.20 mg/dL Final  . Total Bilirubin 03/02/2019 0.6  0.2 - 1.2 mg/dL Final  . Alkaline Phosphatase 03/02/2019 74  39 - 117 U/L Final  . AST 03/02/2019 25  0 - 37 U/L Final  . ALT 03/02/2019 22  0 - 35 U/L Final   . Total Protein 03/02/2019 6.6  6.0 - 8.3 g/dL Final  . Albumin 03/02/2019 3.8  3.5 - 5.2 g/dL Final  . Calcium 03/02/2019 8.9  8.4 - 10.5 mg/dL Final  . GFR 03/02/2019 76.57  >60.00 mL/min Final  . Hgb A1c MFr Bld 03/02/2019 7.0* 4.6 - 6.5 % Final   Glycemic Control Guidelines for People with Diabetes:Non Diabetic:  <6%Goal of Therapy: <7%Additional Action Suggested:  >8%      Allergies as of 03/04/2019      Reactions   Statins Other (See Comments)   Leg pain & severe fatigue      Medication List       Accurate as of Mar 03, 2019  8:54 PM. If you have any questions, ask your nurse or doctor.        Armour Thyroid 60 MG tablet Generic drug:  thyroid Take 60 mg by mouth daily.   ASHWAGANDHA PO Take 1 tablet by mouth daily.   Cod Liver Oil 1000 MG Caps Take 2,000 mg by mouth daily.   CoQ10 200 MG Caps Take 200 mg by mouth daily.   Eliquis 5 MG Tabs tablet Generic drug:  apixaban TAKE 1 TABLET BY MOUTH TWICE A DAY   FreeStyle Libre Reader Devi 1 Device by Does not apply route as directed.   FreeStyle [employer] Misc 1 strip by Does not apply route once a week. Apply to upper arm and change sensor every 10 days. Dx Code E10.9   glucose blood test strip Commonly known as:  ONE TOUCH ULTRA TEST Use asto check blood sugar 5 times a day   GLUTATHIONE PO Take 15 mLs daily by mouth.   insulin aspart 100 UNIT/ML injection Commonly known as:  NovoLOG USE MAX  OF 60 UNITS DAILY VIA INSULIN PUMP.   losartan 25 MG tablet Commonly known as:  COZAAR Take 25 mg by mouth daily.   Menaquinone-7 100 MCG Caps Take 150 mcg by mouth daily.   NON FORMULARY Systemic Formulas Pancreas supplement, 1 per day "                    "           Metabo-Shake (Glycemic Support) 1 scoop per day Reishi w/ Curcumin 1 per day   NON FORMULARY Systemic Formulas Glycemic Balance, 2 per day "                  "             Eyes supplement, 2 per day "                  "               ROX (Antioxidant w/ Reservatrol), 1 per day   NON FORMULARY Take 15 mLs daily by mouth. MAG CAL VANILLA LIQUID   NON FORMULARY Take 1 tablet 2 (two) times daily by mouth. BioFunction "I"   NON FORMULARY Take 1 tablet daily by mouth. Reishi w/ Curcuma   NON FORMULARY 2 capsules daily. astachol ox  NON FORMULARY 1 capsule. phosphatidylserine   RESVERATROL PO Take 250 mg by mouth daily.   SUPER OMEGA-3 PO Take by mouth. Takes 2 capsules per day   Turmeric Curcumin 500 MG Caps Take 1 tablet by mouth 3 (three) times daily.   Vitamin B-12 5000 MCG Subl Place 5,000 mcg under the tongue daily.   vitamin C 1000 MG tablet Take 1,000 mg by mouth daily.   VITAMIN D PO Take 5,000 mcg by mouth daily.       Allergies:  Allergies  Allergen Reactions  . Statins Other (See Comments)    Leg pain & severe fatigue    Past Medical History:  Diagnosis Date  . Anxiety    new dx  . Atrial fibrillation ([place])    a. 03/2015 s/p RFCA;  b. CHA2DS2VASc = 5-->Eliquis.  . 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 Diabetes mellitus type 1 ([facility])    Insulin pump  . Diastolic CHF ([place])    a. 0/8657 - due to AF RVR;  b. 03/2015 Echo: EF 60-65%, no rwma.  . Hyperlipidemia   . Hypertension   . Hypothyroidism   . Insulin pump in place   . Ovarian cyst   . S/P radiation therapy 04/10/12- 05/26/2012   left Breast and Axilla / 50 gy / 25 Fractions with Left Breast Boost / 10 Gy / 5 Fractions  . SIADH (syndrome of inappropriate ADH production) ([place])   . Use of letrozole ([place]) 06/13/12    Past Surgical History:  Procedure Laterality Date  . APPENDECTOMY    . 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 COLONOSCOPY     1 polyp   .  ELECTROPHYSIOLOGIC STUDY N/A 04/08/2015   PVI + CTI ablation + posterior Box lesion + Atach ablation by [name]  . OVARIAN CYST SURGERY    . TEE WITHOUT CARDIOVERSION N/A 04/07/2015   Procedure: TRANSESOPHAGEAL ECHOCARDIOGRAM (TEE);  Surgeon: Thayer Headings, MD;  Location: [facility] ENDOSCOPY;  Service: Cardiovascular;  Laterality: N/A;  . TONSILLECTOMY      Family History  Problem Relation Age of Onset  . Breast cancer Mother 67  . Heart failure Mother   . Prostate cancer Maternal Uncle        died in his 74s  . Heart attack Father   . Diabetes Maternal Uncle 15    Social History:  reports that she has never smoked. She has never used smokeless tobacco. She reports that she does not drink alcohol or use drugs.  REVIEW of systems:   She has a history of mild hypothyroidism followed by a holistic physician Again she is getting stable controlled with a regimen of '60mg'$  Armour Thyroid  TSH normal consistently   Lab Results  Component Value Date   TSH 1.57 03/02/2019    HYPERCHOLESTEROLEMIA: Her LDL has been persistently high and refuses to take a statin drug including pravastatin even though it had no side effects previously.  She was previously trying to use red rice yeast , none now Previously her LDL particle number is consistently high at 1621  She was given a prescription for Crestor which she tried for 3-4 weeks but did not continue because she felt it was giving her headaches and making her tired  She  has been using natural substances and essential oils for her hypercholesterolemia She had been applying concoction of several oils twice a day on her abdominal skin  Her LDL is about the same at 156   Lab Results  Component Value Date   CHOL 240 (H) 03/02/2019   CHOL 230 (H) 10/14/2018   CHOL 203 (H) 02/19/2018   Lab Results  Component Value Date   HDL 72.10 03/02/2019   HDL 68.70 10/14/2018   HDL 57.60 02/19/2018   Lab Results  Component Value Date   LDLCALC 156  (H) 03/02/2019   LDLCALC 150 (H) 10/14/2018   LDLCALC 131 (H) 02/19/2018   Lab Results  Component Value Date   TRIG 59.0 03/02/2019   TRIG 57.0 10/14/2018   TRIG 72.0 02/19/2018   Lab Results  Component Value Date   CHOLHDL 3 03/02/2019   CHOLHDL 3 10/14/2018   CHOLHDL 4 02/19/2018   Lab Results  Component Value Date   LDLDIRECT 148.4 08/06/2013   . She takes numerous herbal supplements as before  HYPONATREMIA: This is mild and persistent and etiology unknown, not on any diuretics or SSRIs    EXAM:  There were no vitals taken for this visit.   ASSESSMENT:  DIABETES on Medtronic insulin pump:  See history of present illness for detailed discussion of current management, blood sugar patterns and problems identified  Her A1c was previously going up gradually and now 7% compared to 7.8  She still has significant hyperglycemia nonfasting with some variability As before he does not bolus enough or on time for her meals and this has been discussed several times before Her A1c may be relatively better because of using the fingersticks which are more accurate than the freestyle libre she was using before and likely doing more correction boluses However unable to get consistent blood sugar patterns and suggest changes in her bolus settings    RECOMMENDATIONS:  Again discussed day-to-day management of boluses, covering all meals on time and not reducing the boluses unnecessarily She will need to check blood sugars also more consistently in the morning and at mealtimes even when she is eating very little carbohydrate No changes in basal rate as yet May benefit from reviewing her diet and bolusing with the nurse educator also  Since she will be hopefully getting the Tandem pump will be able to switch allow better control with closed-loop system and CGM  Hyponatremia: Idiopathic, longstanding and asymptomatic  HYPERLIPIDEMIA: LDL is still consistently high Although she  has been very reluctant to take medications she is now finally agreeing to take pravastatin as it was explained that she had previously tried it without any side effects LDL target was explained She will try 20 mg every other day and consider increasing the dose subsequently   HYPOTHYROIDISM: Followed by other physicians and with taking 60 mg Armour Thyroid her thyroid levels are all normal  Follow-up in 3 months  Counseling time on subjects discussed in assessment and plan sections is over 50% of today's 25 minute visit   Elayne Snare 03/03/2019, 8:54 PM

## 2019-03-04 ENCOUNTER — Ambulatory Visit (INDEPENDENT_AMBULATORY_CARE_PROVIDER_SITE_OTHER): Payer: PPO | Admitting: Endocrinology

## 2019-03-04 ENCOUNTER — Other Ambulatory Visit: Payer: Self-pay

## 2019-03-04 ENCOUNTER — Encounter: Payer: Self-pay | Admitting: Endocrinology

## 2019-03-04 DIAGNOSIS — E038 Other specified hypothyroidism: Secondary | ICD-10-CM | POA: Diagnosis not present

## 2019-03-04 DIAGNOSIS — E871 Hypo-osmolality and hyponatremia: Secondary | ICD-10-CM

## 2019-03-04 DIAGNOSIS — E1065 Type 1 diabetes mellitus with hyperglycemia: Secondary | ICD-10-CM | POA: Diagnosis not present

## 2019-03-04 DIAGNOSIS — E7800 Pure hypercholesterolemia, unspecified: Secondary | ICD-10-CM | POA: Diagnosis not present

## 2019-03-04 MED ORDER — PRAVASTATIN SODIUM 20 MG PO TABS: 20.0000 mg | ORAL_TABLET | Freq: Every day | ORAL | 3 refills | Status: DC

## 2019-03-13 DIAGNOSIS — E1065 Type 1 diabetes mellitus with hyperglycemia: Secondary | ICD-10-CM | POA: Diagnosis not present

## 2019-03-19 ENCOUNTER — Other Ambulatory Visit: Payer: Self-pay | Admitting: Internal Medicine

## 2019-03-19 NOTE — Telephone Encounter (Signed)
Prescription refill request for Eliquis received.  Last office visit: [name] (08-28-2018) Scr: 0.74 (03-02-2019) Age: 73 Weight: 70.3kg (11-11-2017)   Prescription refill request sent.

## 2019-05-05 DIAGNOSIS — E1065 Type 1 diabetes mellitus with hyperglycemia: Secondary | ICD-10-CM | POA: Diagnosis not present

## 2019-05-07 DIAGNOSIS — H2513 Age-related nuclear cataract, bilateral: Secondary | ICD-10-CM | POA: Diagnosis not present

## 2019-05-07 DIAGNOSIS — H5203 Hypermetropia, bilateral: Secondary | ICD-10-CM | POA: Diagnosis not present

## 2019-05-07 DIAGNOSIS — E109 Type 1 diabetes mellitus without complications: Secondary | ICD-10-CM | POA: Diagnosis not present

## 2019-05-07 DIAGNOSIS — H524 Presbyopia: Secondary | ICD-10-CM | POA: Diagnosis not present

## 2019-05-18 ENCOUNTER — Encounter: Payer: PPO | Attending: Endocrinology | Admitting: Nutrition

## 2019-05-18 ENCOUNTER — Other Ambulatory Visit: Payer: Self-pay

## 2019-05-18 DIAGNOSIS — E109 Type 1 diabetes mellitus without complications: Secondary | ICD-10-CM

## 2019-05-18 DIAGNOSIS — E10649 Type 1 diabetes mellitus with hypoglycemia without coma: Secondary | ICD-10-CM | POA: Insufficient documentation

## 2019-05-20 ENCOUNTER — Encounter: Payer: Self-pay | Admitting: Endocrinology

## 2019-05-20 ENCOUNTER — Other Ambulatory Visit: Payer: Self-pay

## 2019-05-20 ENCOUNTER — Ambulatory Visit (INDEPENDENT_AMBULATORY_CARE_PROVIDER_SITE_OTHER): Payer: PPO | Admitting: Endocrinology

## 2019-05-20 ENCOUNTER — Encounter: Payer: PPO | Admitting: Nutrition

## 2019-05-20 DIAGNOSIS — E1065 Type 1 diabetes mellitus with hyperglycemia: Secondary | ICD-10-CM | POA: Diagnosis not present

## 2019-05-20 DIAGNOSIS — E10649 Type 1 diabetes mellitus with hypoglycemia without coma: Secondary | ICD-10-CM

## 2019-05-20 NOTE — Progress Notes (Signed)
  Patient ID: Gina [name], female   DOB: 04/04/1946, 72 y.o.   MRN: [id number]      Reason for Appointment: Insulin Pump followup:   History of Present Illness   Diagnosis: Type 1 DIABETES MELITUS, date of diagnosis:  1984     CURRENT insulin pump:  Minimed 670 has been on a pump since 1995  HISTORY:    The pump SETTINGS are: Midnight = 0.4, 4 AM 1.15.  6 AM = 0.95, 8:30 AM = 1.05, 11:30 AM = 1.0; 2 PM = 0. 9 0 and 6 PM = 0.8, 10 PM 0.55 Total basal insulin 20 units  Carbohydrate ratio 1: 8 before 12 noon, 1:5 for lunch and 1: 8 After 5 PM Blood sugar target 110 .  Correction factor 1: 50   Her last A1c was 7%   Current management, blood sugar patterns and problems identified:    She is now using the tandem pump that was started 2 days ago  Initially had some difficulty connecting her CGM to this pump  She was seen in follow-up today because of tendency to low normal or low sugars periodically  She is using the same basal rates as on the previous pump  She had blood sugar down to 63 before dinnertime on Monday and also low normal before dinnertime yesterday  OVERNIGHT blood sugars: Are tending to be low normal through the night on Tuesday but last night which only transiently at about 3 AM  Glucose was low normal today around 9 AM onwards requiring her basal turned off  As before she is not always bolusing at mealtime because she thinks she is getting very little carbohydrate  However with eating oatmeal and peaches yesterday morning her blood sugar went to 211 and she had only 30 g of carbohydrate entered  Last evening she had a low carbohydrate bread and did not bolus because her blood sugar was low normal before dinner, blood sugar did not appear to be above about 160 with this She says that she has had a challenging time learning the new pump but is trying to do better and is able to program her basal rates after instructions from nurse educator today   She is not comfortable going to the control IQ as yet and is still doing the basal IQ   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  GLUCOSE CONTROL with the pump is assessed today by pump download as above     Wt Readings from Last 3 Encounters:  11/11/18 155 lb (70.3 kg)  08/28/18 153 lb (69.4 kg)  05/28/18 156 lb 3.2 oz (70.9 kg)    LABS:   Lab Results  Component Value Date   HGBA1C 7.0 (H) 03/02/2019   HGBA1C 7.8 (H) 10/14/2018   HGBA1C 7.3 (H) 05/23/2018   Lab Results  Component Value Date   MICROALBUR 0.9 02/19/2018   LDLCALC 156 (H) 03/02/2019   CREATININE 0.74 03/02/2019    Other problems discussed today: Review of systems  No visits with results within 1 Week(s) from this visit.  Latest known visit with results is:  Lab on 03/02/2019  Component Date Value Ref Range Status  . T3, Free 03/02/2019 4.1  2.3 - 4.2 pg/mL Final  . Free T4 03/02/2019 1.05  0.60 - 1.60 ng/dL Final   Comment: Specimens from patients who are undergoing biotin therapy and /or ingesting biotin supplements may contain high levels of biotin.  The higher biotin concentration in these   specimens interferes with this Free T4 assay.  Specimens that contain high levels  of biotin may cause false high results for this Free T4 assay.  Please interpret results in light of the total clinical presentation of the patient.    Marland Kitchen TSH 03/02/2019 1.57  0.35 - 4.50 uIU/mL Final  . Cholesterol 03/02/2019 240* 0 - 200 mg/dL Final   ATP III Classification       Desirable:  < 200 mg/dL               Borderline High:  200 - 239 mg/dL          High:  > = 240 mg/dL  . Triglycerides 03/02/2019 59.0  0.0 - 149.0 mg/dL Final   Normal:  <150 mg/dLBorderline High:  150 - 199 mg/dL  . HDL 03/02/2019 72.10  >39.00 mg/dL Final  . VLDL 03/02/2019 11.8  0.0 - 40.0 mg/dL Final  . LDL Cholesterol 03/02/2019 156* 0 - 99 mg/dL Final  . Total CHOL/HDL Ratio 03/02/2019 3   Final                  Men          Women1/2  Average Risk     3.4          3.3Average Risk          5.0          4.42X Average Risk          9.6          7.13X Average Risk          15.0          11.0                      . NonHDL 03/02/2019 167.81   Final   NOTE:  Non-HDL goal should be 30 mg/dL higher than patient's LDL goal (i.e. LDL goal of < 70 mg/dL, would have non-HDL goal of < 100 mg/dL)  . Sodium 03/02/2019 132* 135 - 145 mEq/L Final  . Potassium 03/02/2019 4.6  3.5 - 5.1 mEq/L Final  . Chloride 03/02/2019 97  96 - 112 mEq/L Final  . CO2 03/02/2019 30  19 - 32 mEq/L Final  . Glucose, Bld 03/02/2019 159* 70 - 99 mg/dL Final  . BUN 03/02/2019 8  6 - 23 mg/dL Final  . Creatinine, Ser 03/02/2019 0.74  0.40 - 1.20 mg/dL Final  . Total Bilirubin 03/02/2019 0.6  0.2 - 1.2 mg/dL Final  . Alkaline Phosphatase 03/02/2019 74  39 - 117 U/L Final  . AST 03/02/2019 25  0 - 37 U/L Final  . ALT 03/02/2019 22  0 - 35 U/L Final  . Total Protein 03/02/2019 6.6  6.0 - 8.3 g/dL Final  . Albumin 03/02/2019 3.8  3.5 - 5.2 g/dL Final  . Calcium 03/02/2019 8.9  8.4 - 10.5 mg/dL Final  . GFR 03/02/2019 76.57  >60.00 mL/min Final  . Hgb A1c MFr Bld 03/02/2019 7.0* 4.6 - 6.5 % Final   Glycemic Control Guidelines for People with Diabetes:Non Diabetic:  <6%Goal of Therapy: <7%Additional Action Suggested:  >8%      Allergies as of 05/20/2019      Reactions   Statins Other (See Comments)   Leg pain & severe fatigue      Medication List       Accurate as of May 20, 2019 11:43 AM. If you have any questions,  ask your nurse or doctor.        Armour Thyroid 60 MG tablet Generic drug: thyroid Take 60 mg by mouth daily.   ASHWAGANDHA PO Take 1 tablet by mouth daily.   Cod Liver Oil 1000 MG Caps Take 2,000 mg by mouth daily.   CoQ10 200 MG Caps Take 200 mg by mouth daily.   Eliquis 5 MG Tabs tablet Generic drug: apixaban TAKE 1 TABLET BY MOUTH TWICE A DAY   FreeStyle Libre Reader Devi 1 Device by Does not apply route as directed.    FreeStyle [employer] Misc 1 strip by Does not apply route once a week. Apply to upper arm and change sensor every 10 days. Dx Code E10.9   glucose blood test strip Commonly known as: ONE TOUCH ULTRA TEST Use asto check blood sugar 5 times a day   GLUTATHIONE PO Take 15 mLs daily by mouth.   insulin aspart 100 UNIT/ML injection Commonly known as: NovoLOG USE MAX  OF 60 UNITS DAILY VIA INSULIN PUMP.   losartan 25 MG tablet Commonly known as: COZAAR Take 25 mg by mouth daily.   Menaquinone-7 100 MCG Caps Take 150 mcg by mouth daily.   NON FORMULARY Systemic Formulas Pancreas supplement, 1 per day "                    "           Metabo-Shake (Glycemic Support) 1 scoop per day Reishi w/ Curcumin 1 per day   NON FORMULARY Systemic Formulas Glycemic Balance, 2 per day "                  "             Eyes supplement, 2 per day "                  "              ROX (Antioxidant w/ Reservatrol), 1 per day   NON FORMULARY Take 15 mLs daily by mouth. MAG CAL VANILLA LIQUID   NON FORMULARY Take 1 tablet 2 (two) times daily by mouth. BioFunction "I"   NON FORMULARY Take 1 tablet daily by mouth. Reishi w/ Curcuma   NON FORMULARY 2 capsules daily. astachol ox   NON FORMULARY 1 capsule. phosphatidylserine   pravastatin 20 MG tablet Commonly known as: PRAVACHOL Take 1 tablet (20 mg total) by mouth daily.   RESVERATROL PO Take 250 mg by mouth daily.   SUPER OMEGA-3 PO Take by mouth. Takes 2 capsules per day   Turmeric Curcumin 500 MG Caps Take 1 tablet by mouth 3 (three) times daily.   Vitamin B-12 5000 MCG Subl Place 5,000 mcg under the tongue daily.   vitamin C 1000 MG tablet Take 1,000 mg by mouth daily.   VITAMIN D PO Take 5,000 mcg by mouth daily.       Allergies:  Allergies  Allergen Reactions  . Statins Other (See Comments)    Leg pain & severe fatigue    Past Medical History:  Diagnosis Date  . Anxiety    new dx  . Atrial fibrillation  ([place])    a. 03/2015 s/p RFCA;  b. CHA2DS2VASc = 5-->Eliquis.  . 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 Diabetes mellitus type 1 ([facility])    Insulin pump  .  Diastolic CHF ([facility])    a. 12/2014 - due to AF RVR;  b. 03/2015 Echo: EF 60-65%, no rwma.  . Hyperlipidemia   . Hypertension   . Hypothyroidism   . Insulin pump in place   . Ovarian cyst   . S/P radiation therapy 04/10/12- 05/26/2012   left Breast and Axilla / 50 gy / 25 Fractions with Left Breast Boost / 10 Gy / 5 Fractions  . SIADH (syndrome of inappropriate ADH production) ([facility])   . Use of letrozole ([place]) 06/13/12    Past Surgical History:  Procedure Laterality Date  . APPENDECTOMY    . 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 COLONOSCOPY     1 polyp   . ELECTROPHYSIOLOGIC STUDY N/A 04/08/2015   PVI + CTI ablation + posterior Box lesion + Atach ablation by Dr Allred  . OVARIAN CYST SURGERY    . TEE WITHOUT CARDIOVERSION N/A 04/07/2015   Procedure: TRANSESOPHAGEAL ECHOCARDIOGRAM (TEE);  Surgeon: Philip J Nahser, MD;  Location: [facility];  Service: Cardiovascular;  Laterality: N/A;  . TONSILLECTOMY      Family History  Problem Relation Age of Onset  . Breast cancer Mother 57  . Heart failure Mother   . Prostate cancer Maternal Uncle        died in his 60s  . Heart attack Father   . Diabetes Maternal Uncle 15    Social History:  reports that she has never smoked. She has never used smokeless tobacco. She reports that she does not drink alcohol or use drugs.  REVIEW of systems:   The following is a copy of the previous note:  She has a history of mild hypothyroidism followed by a holistic physician Again she is getting stable controlled with a regimen of 60mg  Armour Thyroid  TSH normal consistently   Lab Results  Component Value Date   TSH 1.57 03/02/2019    HYPERCHOLESTEROLEMIA: Her LDL has been persistently high and refuses to take a statin drug including pravastatin even though it had no side effects previously.  She was previously trying to use red rice yeast , none now Previously her LDL particle number is consistently high at 1621  She was given a prescription for Crestor which she tried for 3-4 weeks but did not continue because she felt it was giving her headaches and making her tired  She has been using natural substances and essential oils for her hypercholesterolemia She had been applying concoction of several oils twice a day on her abdominal skin  Her LDL is about the same at 156   Lab Results  Component Value Date   CHOL 240 (H) 03/02/2019   CHOL 230 (H) 10/14/2018   CHOL 203 (H) 02/19/2018   Lab Results  Component Value Date   HDL 72.10 03/02/2019   HDL 68.70 10/14/2018   HDL 57.60 02/19/2018   Lab Results  Component Value Date   LDLCALC 156 (H) 03/02/2019   LDLCALC 150 (H) 10/14/2018   LDLCALC 131 (H) 02/19/2018   Lab Results  Component Value Date   TRIG 59.0 03/02/2019   TRIG 57.0 10/14/2018   TRIG 72.0 02/19/2018   Lab Results  Component Value Date   CHOLHDL 3 03/02/2019   CHOLHDL 3 10/14/2018   CHOLHDL 4 02/19/2018   Lab Results  Component Value Date   LDLDIRECT 148.4 08/06/2013   .   She takes numerous herbal supplements as before  HYPONATREMIA: This is mild and persistent and etiology unknown, not on any diuretics or SSRIs    EXAM:  There were no vitals taken for this visit.   ASSESSMENT:  DIABETES on T-Slim pump now See history of present illness for detailed discussion of current diabetes management, blood sugar patterns and problems identified   Her blood sugars are excellent except they are intending to be low normal before dinnertime and around breakfast time based on her  current basal settings She is only on the basal IQ setting currently   RECOMMENDATIONS:  Basal rate changes as follows: 6 AM = 1.10, 8 AM = 0.95 and 6 PM = 0.70 on 10:10 PM She will call if she has any difficulties otherwise keep her follow-up appointment as scheduled She will also make sure she can upload her pump information to the website  Ajay Kumar 05/20/2019, 11:43 AM     

## 2019-05-22 NOTE — Patient Instructions (Signed)
Review manual and handouts Call Tandem 800 number if questions

## 2019-05-22 NOTE — Progress Notes (Signed)
Patient and her husband are here to learn how to use the Tandem pump.  She had set the date/time and charged the pump before coming as directed. We reviewed how this pump works--how to bolus, fill and change cartridge,respond to alerts and alarms, start/stop pump, temp basal rate, and put in settings.  Settings were put in per old Medtronic 630 settings.  Basal rate: MN: 0.4, 4AM: 1.15, 6AM: .95, 8:30AM: 1.05, 11:30AM: 1.0, 2PM: .9, 6PM: .8, 10P: .55.  I/C: 8, ISF: 50, timing 4 hours, target 110.   She filled a cartridge and the infusion sets they sent her were Soft sets, and she was using a needle infusion set.  I did not have any to give her and was told to call them to exchange them for the [place] sets.  She agreed to do this. She reviewed X3 how to give a bolus correctly, and had no final questions.  She was given handouts on how to do all of the above discussed topics, and she had no final questions.

## 2019-05-22 NOTE — Patient Instructions (Signed)
Read over manual and handouts Call if blood sugars drop below 70 or remain over 250.

## 2019-05-22 NOTE — Progress Notes (Signed)
We reviewed all topics on the checklist and she signed off as understanding all of them.  She did not want to do a cartridge change today, but we reviewed the steps. She was encouraged to follow the steps on the handout given  We linked her pump to T-connect.  She reports having trouble with the Dexcom to going to her pump.  When viewed, she had no stopped the hand held device and was to reminded that the Dexcom can only go to 2 devices.  She has had some lows: the first night and then this morning when her Basal IQ had stopped the flow.  The download was shown to Dr. Dwyane Dee to review.  We reviewed again how to change the settings and she reported good understanding of this.

## 2019-05-26 ENCOUNTER — Other Ambulatory Visit: Payer: Self-pay | Admitting: Endocrinology

## 2019-06-01 ENCOUNTER — Other Ambulatory Visit (INDEPENDENT_AMBULATORY_CARE_PROVIDER_SITE_OTHER): Payer: PPO

## 2019-06-01 ENCOUNTER — Other Ambulatory Visit: Payer: Self-pay

## 2019-06-01 DIAGNOSIS — E038 Other specified hypothyroidism: Secondary | ICD-10-CM

## 2019-06-01 DIAGNOSIS — E7800 Pure hypercholesterolemia, unspecified: Secondary | ICD-10-CM

## 2019-06-01 DIAGNOSIS — E1065 Type 1 diabetes mellitus with hyperglycemia: Secondary | ICD-10-CM

## 2019-06-01 LAB — COMPREHENSIVE METABOLIC PANEL
ALT: 14 U/L (ref 0–35)
AST: 17 U/L (ref 0–37)
Albumin: 4.3 g/dL (ref 3.5–5.2)
Alkaline Phosphatase: 84 U/L (ref 39–117)
BUN: 13 mg/dL (ref 6–23)
CO2: 28 mEq/L (ref 19–32)
Calcium: 9.4 mg/dL (ref 8.4–10.5)
Chloride: 97 mEq/L (ref 96–112)
Creatinine, Ser: 0.71 mg/dL (ref 0.40–1.20)
GFR: 80.27 mL/min (ref >60.00)
Glucose, Bld: 148 mg/dL — ABNORMAL HIGH (ref 70–99)
Potassium: 4.5 mEq/L (ref 3.5–5.1)
Sodium: 132 mEq/L — ABNORMAL LOW (ref 135–145)
Total Bilirubin: 0.5 mg/dL (ref 0.2–1.2)
Total Protein: 7 g/dL (ref 6.0–8.3)

## 2019-06-01 LAB — TSH: TSH: 1.46 u[IU]/mL (ref 0.35–4.50)

## 2019-06-01 LAB — LIPID PANEL
Cholesterol: 223 mg/dL — ABNORMAL HIGH (ref 0–200)
HDL: 64.8 mg/dL (ref >39.00)
LDL Cholesterol: 145 mg/dL — ABNORMAL HIGH (ref 0–99)
NonHDL: 157.94
Total CHOL/HDL Ratio: 3
Triglycerides: 64 mg/dL (ref 0.0–149.0)
VLDL: 12.8 mg/dL (ref 0.0–40.0)

## 2019-06-01 LAB — HEMOGLOBIN A1C: Hgb A1c MFr Bld: 7.1 % — ABNORMAL HIGH (ref 4.6–6.5)

## 2019-06-01 LAB — MICROALBUMIN / CREATININE URINE RATIO
Creatinine,U: 107.5 mg/dL
Microalb Creat Ratio: 1.3 mg/g (ref 0.0–30.0)
Microalb, Ur: 1.4 mg/dL (ref 0.0–1.9)

## 2019-06-03 NOTE — Progress Notes (Signed)
Patient ID: Gina [name], female   DOB: 1946-07-17, 73 y.o.   MRN: [id number]      Reason for Appointment: Insulin Pump followup:   History of Present Illness   Diagnosis: Type 1 DIABETES MELITUS, date of diagnosis:  1984      HISTORY:   T-Slim PUMP SETTINGS are: Midnight = 0.4, 4 AM 1.15.  6 AM = 1.10, 8: 0 AM = 0.95, 11:30 AM = 1.0; 2 PM = 0. 9 0 and 6 PM = 0.7, 10 PM 0.55 Total basal insulin 20 units  Carbohydrate ratio 1: 8 at all meals Blood sugar target 100.  Correction factor 1: 50 Active insulin 4 hours CGM low alert 80  Her last A1c was 7%, now 7.1   Current management, blood sugar patterns and problems identified:    She is using the tandem pump with the basal IQ  Her pump settings were reviewed on the download and her CGM interpretation as discussed below with current management and problems identified also  She is somewhat concerned about getting numerous alerts for low normal blood sugars especially after bedtime and some during the night  Changing tubing every 2.4 days with a total average daily dose recently 26 units  CONTINUOUS GLUCOSE MONITORING RECORD INTERPRETATION    Dates of Recording: 8/6 through 8/19  Sensor description: Dexcom   Results statistics:   CGM use  13+ days  Average and SD   Time in range     77   %  % Time Above 180  21  % Time above 250   % Time Below target  2    Glycemic patterns summary:   Hyperglycemic episodes are occurring during the night, midafternoon and some after 7 PM  Hypoglycemic episodes have been minimal with only significant low sugars between 10:30 AM and 12:30 PM This may be related to increased physical activity on Sundays at home Also she appears to have had somewhat prolonged glycemia hyperglycemia on the night of 8/11, she did not change her infusion set that day or the next  Overnight periods: Blood sugars have been somewhat variable but periodically low normal readings between 11  PM and 1 AM On a couple of occasions blood sugars are running higher through the night and not clear why On an average of blood sugars during the night are running between 145 and 165 approximately  Preprandial periods: Blood sugars are averaging about 140 at breakfast, averaging 130 at lunch and 150-160 at dinnertime  Postprandial periods:   After breakfast: She usually does not have many boluses or carbohydrate intake blood sugars are not assessed but generally they are fairly well controlled except above 180 on 8/11.  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 Microalb, Ur 06/01/2019 1.4  0.0 - 1.9 mg/dL Final  . Creatinine,U 06/01/2019 107.5  mg/dL Final  . Microalb Creat Ratio 06/01/2019 1.3  0.0 - 30.0 mg/g Final  . Cholesterol 06/01/2019 223* 0 - 200 mg/dL Final   ATP III Classification       Desirable:  < 200 mg/dL               Borderline High:  200 - 239 mg/dL          High:  > = 240 mg/dL  . Triglycerides 06/01/2019 64.0  0.0 -  149.0 mg/dL Final   Normal:  <150 mg/dLBorderline High:  150 - 199 mg/dL  . HDL 06/01/2019 64.80  >39.00 mg/dL Final  . VLDL 06/01/2019 12.8  0.0 - 40.0 mg/dL Final  . LDL Cholesterol 06/01/2019 145* 0 - 99 mg/dL Final  . Total CHOL/HDL Ratio 06/01/2019 3   Final                  Men          Women1/2 Average Risk     3.4          3.3Average Risk          5.0          4.42X Average Risk          9.6          7.13X Average Risk          15.0          11.0                      . NonHDL 06/01/2019 157.94   Final   NOTE:  Non-HDL goal should be 30 mg/dL higher than patient's LDL goal (i.e. LDL goal of < 70 mg/dL, would have non-HDL goal of < 100 mg/dL)  . TSH 06/01/2019 1.46  0.35 - 4.50 uIU/mL Final  . Sodium 06/01/2019 132* 135 - 145 mEq/L Final  . Potassium 06/01/2019 4.5  3.5 - 5.1 mEq/L Final  . Chloride 06/01/2019 97  96 - 112 mEq/L Final  . CO2 06/01/2019 28  19 - 32 mEq/L Final  . Glucose, Bld 06/01/2019 148* 70 - 99 mg/dL Final  . BUN 06/01/2019 13  6 - 23 mg/dL Final  . Creatinine, Ser 06/01/2019 0.71  0.40 - 1.20 mg/dL Final  . Total Bilirubin 06/01/2019 0.5  0.2 - 1.2 mg/dL Final  . Alkaline Phosphatase 06/01/2019 84  39 - 117 U/L Final  . AST 06/01/2019 17  0 - 37 U/L Final  . ALT 06/01/2019 14  0 - 35 U/L Final  . Total Protein 06/01/2019 7.0  6.0 - 8.3 g/dL Final  . Albumin 06/01/2019 4.3  3.5 - 5.2 g/dL Final  . Calcium 06/01/2019 9.4  8.4 - 10.5 mg/dL Final  . GFR 06/01/2019 80.27  >60.00 mL/min Final  . Hgb A1c MFr Bld 06/01/2019 7.1* 4.6 - 6.5 % Final   Glycemic Control Guidelines for People with Diabetes:Non Diabetic:  <6%Goal of Therapy: <7%Additional Action Suggested:  >8%  Allergies as of 06/04/2019      Reactions   Statins Other (See Comments)   Leg pain & severe fatigue      Medication List       Accurate as of June 04, 2019  8:49 AM. If you have any questions, ask your nurse or doctor.        STOP taking these medications   FreeStyle Libre Reader  Kerrin Mo Stopped by: Elayne Snare, MD   pravastatin 20 MG tablet Commonly known as: PRAVACHOL Stopped by: Elayne Snare, MD     TAKE these medications   Armour Thyroid 60 MG tablet Generic drug: thyroid Take 60 mg by mouth daily.   ASHWAGANDHA PO Take 1 tablet by mouth daily.   Cod Liver Oil 1000 MG Caps Take 2,000 mg by mouth daily.   CoQ10 200 MG Caps Take 200 mg by mouth daily.   Dexcom G6 Sensor Misc by Does not apply route. What changed: Another medication with the same name was removed. Continue taking this medication, and follow the directions you see here. Changed by: Elayne Snare, MD   Dexcom G6 Transmitter Misc by Does not apply route.   Eliquis 5 MG Tabs tablet Generic drug: apixaban TAKE 1 TABLET BY MOUTH TWICE A DAY   glucose blood test strip Commonly known as: ONE TOUCH ULTRA TEST Use asto check blood sugar 5 times a day   GLUTATHIONE PO Take 15 mLs daily by mouth.   insulin aspart 100 UNIT/ML injection Commonly known as: NovoLOG USE MAX  OF 60 UNITS DAILY VIA INSULIN PUMP.   losartan 25 MG tablet Commonly known as: COZAAR Take 25 mg by mouth daily.   Menaquinone-7 100 MCG Caps Take 150 mcg by mouth daily.   NON FORMULARY Systemic Formulas Pancreas supplement, 1 per day "                    "           Metabo-Shake (Glycemic Support) 1 scoop per day Reishi w/ Curcumin 1 per day   NON FORMULARY Systemic Formulas Glycemic Balance, 2 per day "                  "             Eyes supplement, 2 per day "                  "              ROX (Antioxidant w/ Reservatrol), 1 per day   NON FORMULARY Take 15 mLs daily by mouth. MAG CAL VANILLA LIQUID   NON FORMULARY Take 1 tablet 2 (two) times daily by mouth. BioFunction "I"   NON FORMULARY Take 1 tablet daily by mouth. Reishi w/ Curcuma   NON FORMULARY 2 capsules daily. astachol ox   NON FORMULARY 1 capsule. phosphatidylserine   RESVERATROL PO Take 250 mg by mouth daily.   SUPER OMEGA-3 PO  Take by mouth. Takes 2 capsules per day   Turmeric Curcumin 500 MG Caps Take 1 tablet by mouth 3 (three) times daily.   Vitamin B-12 5000 MCG Subl Place 5,000 mcg under the tongue daily.   vitamin C 1000 MG tablet Take 1,000 mg by mouth daily.   VITAMIN D PO Take 5,000 mcg by mouth daily.       Allergies:  Allergies  Allergen Reactions  . Statins Other (See Comments)    Leg pain &  severe fatigue    Past Medical History:  Diagnosis Date  . Anxiety    new dx  . Atrial fibrillation ([place])    a. 03/2015 s/p RFCA;  b. CHA2DS2VASc = 5-->Eliquis.  . 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 Diabetes mellitus type 1 (HCC)    Insulin pump  . Diastolic CHF ([place])    a. 10/1655 - due to AF RVR;  b. 03/2015 Echo: EF 60-65%, no rwma.  . Hyperlipidemia   . Hypertension   . Hypothyroidism   . Insulin pump in place   . Ovarian cyst   . S/P radiation therapy 04/10/12- 05/26/2012   left Breast and Axilla / 50 gy / 25 Fractions with Left Breast Boost / 10 Gy / 5 Fractions  . SIADH (syndrome of inappropriate ADH production) ([place])   . Use of letrozole ([facility]) 06/13/12    Past Surgical History:  Procedure Laterality Date  . APPENDECTOMY    . 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 COLONOSCOPY     1 polyp   . ELECTROPHYSIOLOGIC STUDY N/A 04/08/2015   PVI + CTI ablation + posterior Box lesion + Atach ablation by [name]  . OVARIAN CYST SURGERY    . TEE WITHOUT CARDIOVERSION N/A 04/07/2015   Procedure: TRANSESOPHAGEAL ECHOCARDIOGRAM (TEE);  Surgeon: Thayer Headings, MD;  Location: [facility]COPY;  Service: Cardiovascular;  Laterality: N/A;  . TONSILLECTOMY      Family History  Problem Relation Age of Onset  . Breast cancer Mother 43   . Heart failure Mother   . Prostate cancer Maternal Uncle        died in his 33s  . Heart attack Father   . Diabetes Maternal Uncle 15    Social History:  reports that she has never smoked. She has never used smokeless tobacco. She reports that she does not drink alcohol or use drugs.  REVIEW of systems:      She has a history of mild hypothyroidism followed by a holistic physician Again she is getting stable controlled with a regimen of '60mg'$  Armour Thyroid  TSH normal consistently   Lab Results  Component Value Date   TSH 1.46 06/01/2019    HYPERCHOLESTEROLEMIA: Her LDL has been persistently high and refuses to take a statin drug including pravastatin even though it had no side effects previously.  She was previously trying to use red rice yeast , none now Previously her LDL particle number is consistently high at 1621  She was given a prescription for Crestor which she tried for 3-4 weeks but did not continue because she felt it was giving her headaches and making her tired  She has been using natural substances and essential oils for her hypercholesterolemia   Her LDL is slightly better than before but still above target   Lab Results  Component Value Date   CHOL 223 (H) 06/01/2019   CHOL 240 (H) 03/02/2019   CHOL 230 (H) 10/14/2018   Lab Results  Component Value Date   HDL 64.80 06/01/2019   HDL 72.10 03/02/2019   HDL 68.70 10/14/2018   Lab Results  Component Value Date   LDLCALC 145 (H) 06/01/2019   LDLCALC 156 (H) 03/02/2019   [place] 150 (H) 10/14/2018   Lab Results  Component Value Date   TRIG 64.0 06/01/2019   TRIG 59.0 03/02/2019   TRIG 57.0 10/14/2018   Lab Results  Component Value Date   CHOLHDL 3 06/01/2019   CHOLHDL 3 03/02/2019   CHOLHDL 3 10/14/2018   Lab Results  Component Value Date   LDLDIRECT 148.4 08/06/2013   . She takes numerous herbal supplements as before  HYPONATREMIA: This is mild and persistent and etiology  unknown, not on any diuretics or SSRIs    EXAM:  There were no vitals taken for this visit.   ASSESSMENT:  DIABETES on T-Slim pump  See history of present illness for detailed discussion of current diabetes management, blood sugar patterns and problems identified  Her A1c is 7.1  Her blood sugars are somewhat variable although on an average fairly well controlled with her CGM average 146 Hyperglycemia related to inadequate coverage of her meals or missed boluses as well as a couple of nights where it was unexpectedly high Hypoglycemia has been minimal and mostly when she is more active late morning She is concerned about overnight hypoglycemia and frequent alarms   RECOMMENDATIONS:  Basal rate changes as follows: 8 AM = 0.90 and 10 PM = 0.50 Low alert 70 instead of 80 Target BG 120 instead of 100 She will need to bolus for all her meals and carbohydrate intakes especially lunch and dinner at a time May use temporary basal when planning to be very active Will need to make sure she is able to upgrade to the control IQ Call if having continued problems as above  Mild asymptomatic hyponatremia: Avoid excessive fluid intake for hyponatremia  Hyperlipidemia: Not controlled and she is refusing to take any medications as before  Hypothyroidism: Adequately controlled  Also recommended that she check her blood pressure periodically  She will try to get her influenza vaccine in October or November  Labs show no microalbuminuria  Counseling time on subjects discussed in assessment and plan sections is over 50% of today's 25 minute visit   Elayne Snare 06/04/2019, 8:49 AM

## 2019-06-04 ENCOUNTER — Ambulatory Visit (INDEPENDENT_AMBULATORY_CARE_PROVIDER_SITE_OTHER): Payer: PPO | Admitting: Endocrinology

## 2019-06-04 ENCOUNTER — Encounter: Payer: Self-pay | Admitting: Endocrinology

## 2019-06-04 ENCOUNTER — Other Ambulatory Visit: Payer: Self-pay

## 2019-06-04 DIAGNOSIS — E7800 Pure hypercholesterolemia, unspecified: Secondary | ICD-10-CM | POA: Diagnosis not present

## 2019-06-04 DIAGNOSIS — E1065 Type 1 diabetes mellitus with hyperglycemia: Secondary | ICD-10-CM

## 2019-06-04 DIAGNOSIS — E038 Other specified hypothyroidism: Secondary | ICD-10-CM

## 2019-06-04 DIAGNOSIS — E871 Hypo-osmolality and hyponatremia: Secondary | ICD-10-CM

## 2019-06-05 DIAGNOSIS — E1065 Type 1 diabetes mellitus with hyperglycemia: Secondary | ICD-10-CM | POA: Diagnosis not present

## 2019-06-10 ENCOUNTER — Encounter: Payer: PPO | Admitting: Nutrition

## 2019-06-10 ENCOUNTER — Other Ambulatory Visit: Payer: Self-pay

## 2019-06-10 DIAGNOSIS — E109 Type 1 diabetes mellitus without complications: Secondary | ICD-10-CM

## 2019-06-10 DIAGNOSIS — E1065 Type 1 diabetes mellitus with hyperglycemia: Secondary | ICD-10-CM | POA: Diagnosis not present

## 2019-06-12 NOTE — Progress Notes (Signed)
Reviewed technique for changing cartridge and alerts/alarms.  We discussed how to upgrade her Basal IQ to Control IQ. Steps for this process were given to her for this.   Prescription for this, was faxed to [facility].  She will be able to do this tomorrow.   She had no final questions.

## 2019-06-12 NOTE — Patient Instructions (Signed)
Review steps to upgrade to Control IQ

## 2019-06-17 DIAGNOSIS — H2513 Age-related nuclear cataract, bilateral: Secondary | ICD-10-CM | POA: Diagnosis not present

## 2019-07-06 DIAGNOSIS — E1065 Type 1 diabetes mellitus with hyperglycemia: Secondary | ICD-10-CM | POA: Diagnosis not present

## 2019-07-13 DIAGNOSIS — E1065 Type 1 diabetes mellitus with hyperglycemia: Secondary | ICD-10-CM | POA: Diagnosis not present

## 2019-07-15 ENCOUNTER — Telehealth: Payer: Self-pay

## 2019-07-15 NOTE — Telephone Encounter (Signed)
Company: [employer]  Document: Request for most recent office notes Other records requested: Office notes DOS 06/04/19  All above requested information has been faxed successfully to [employer] listed above. Documents and fax confirmation have been placed in the faxed file for future reference.

## 2019-07-21 DIAGNOSIS — H2511 Age-related nuclear cataract, right eye: Secondary | ICD-10-CM | POA: Diagnosis not present

## 2019-07-21 DIAGNOSIS — H25811 Combined forms of age-related cataract, right eye: Secondary | ICD-10-CM | POA: Diagnosis not present

## 2019-08-05 DIAGNOSIS — E1065 Type 1 diabetes mellitus with hyperglycemia: Secondary | ICD-10-CM | POA: Diagnosis not present

## 2019-08-12 DIAGNOSIS — E1065 Type 1 diabetes mellitus with hyperglycemia: Secondary | ICD-10-CM | POA: Diagnosis not present

## 2019-08-24 ENCOUNTER — Other Ambulatory Visit: Payer: Self-pay

## 2019-08-24 ENCOUNTER — Telehealth: Payer: Self-pay | Admitting: Nutrition

## 2019-08-24 MED ORDER — INSULIN ASPART 100 UNIT/ML ~~LOC~~ SOLN: SUBCUTANEOUS | 1 refills | Status: DC

## 2019-08-24 NOTE — Telephone Encounter (Signed)
Rx sent 

## 2019-08-24 NOTE — Telephone Encounter (Signed)
Patient reports that her Novolog is going to expire this month and would like you to renew/refill the script to the [facility] at [phone].  Thank you

## 2019-08-25 DIAGNOSIS — H2512 Age-related nuclear cataract, left eye: Secondary | ICD-10-CM | POA: Diagnosis not present

## 2019-08-25 DIAGNOSIS — H25812 Combined forms of age-related cataract, left eye: Secondary | ICD-10-CM | POA: Diagnosis not present

## 2019-09-05 DIAGNOSIS — E1065 Type 1 diabetes mellitus with hyperglycemia: Secondary | ICD-10-CM | POA: Diagnosis not present

## 2019-09-09 ENCOUNTER — Other Ambulatory Visit: Payer: Self-pay

## 2019-09-11 DIAGNOSIS — E1065 Type 1 diabetes mellitus with hyperglycemia: Secondary | ICD-10-CM | POA: Diagnosis not present

## 2019-09-14 DIAGNOSIS — E1065 Type 1 diabetes mellitus with hyperglycemia: Secondary | ICD-10-CM | POA: Diagnosis not present

## 2019-09-15 ENCOUNTER — Telehealth: Payer: Self-pay

## 2019-09-21 ENCOUNTER — Other Ambulatory Visit: Payer: Self-pay

## 2019-09-21 ENCOUNTER — Telehealth (INDEPENDENT_AMBULATORY_CARE_PROVIDER_SITE_OTHER): Payer: PPO | Admitting: Internal Medicine

## 2019-09-21 VITALS — BP 150/79 | HR 78 | Ht 64.0 in | Wt 145.0 lb

## 2019-09-21 DIAGNOSIS — I4819 Other persistent atrial fibrillation: Secondary | ICD-10-CM

## 2019-09-21 DIAGNOSIS — I119 Hypertensive heart disease without heart failure: Secondary | ICD-10-CM | POA: Diagnosis not present

## 2019-09-21 NOTE — Progress Notes (Signed)
Electrophysiology TeleHealth Note   Due to national recommendations of social distancing due to [place] 19, an audio telehealth visit is felt to be most appropriate for this patient at this time.  Verbal consent was obtained by me for the telehealth visit today.  The patient does not have capability for a virtual visit.  A phone visit is therefore required today.   Date:  09/21/2019   ID:  Gina [name], DOB [birth year], MRN [id number]  Location: patient's home  Provider location:  [facility]  Evaluation Performed: Follow-up visit  PCP:  Susy Frizzle, MD   Electrophysiologist:  [name]  Chief Complaint:  AF follow up  History of Present Illness:    Gina [name] is a 73 y.o. female who presents via telehealth conferencing today.  Since last being seen in our clinic, the patient reports doing very well.  Today, she denies symptoms of palpitations, chest pain, shortness of breath,  lower extremity edema, dizziness, presyncope, or syncope.  The patient is otherwise without complaint today.  The patient denies symptoms of fevers, chills, cough, or new SOB worrisome for COVID 19.  Past Medical History:  Diagnosis Date  . Anxiety    new dx  . Atrial fibrillation ([place])    a. 03/2015 s/p RFCA;  b. CHA2DS2VASc = 5-->Eliquis.  . 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 Diabetes mellitus type 1 (HCC)    Insulin pump  . Diastolic CHF ([place])    a. 02/4269 - due to AF RVR;  b. 03/2015 Echo: EF 60-65%, no rwma.  . Hyperlipidemia   . Hypertension   . Hypothyroidism   . Insulin pump in place   . Ovarian cyst   . S/P radiation therapy 04/10/12- 05/26/2012   left Breast and Axilla / 50 gy / 25 Fractions with Left Breast Boost / 10 Gy / 5 Fractions  . SIADH (syndrome of inappropriate ADH production) ([place])   . Use of letrozole ([place]) 06/13/12    Past Surgical  History:  Procedure Laterality Date  . APPENDECTOMY    . 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 COLONOSCOPY     1 polyp   . ELECTROPHYSIOLOGIC STUDY N/A 04/08/2015   PVI + CTI ablation + posterior Box lesion + Atach ablation by [name]  . OVARIAN CYST SURGERY    . TEE WITHOUT CARDIOVERSION N/A 04/07/2015   Procedure: TRANSESOPHAGEAL ECHOCARDIOGRAM (TEE);  Surgeon: Thayer Headings, MD;  Location: [place] [facility];  Service: Cardiovascular;  Laterality: N/A;  . TONSILLECTOMY      Current Outpatient Medications  Medication Sig Dispense Refill  . Ascorbic Acid (VITAMIN C) 1000 MG tablet Take 1,000 mg by mouth daily.     . ASHWAGANDHA PO Take 1 tablet by mouth daily.    . Cholecalciferol (VITAMIN D PO) Take 5,000 mcg by mouth daily.    Marland Kitchen Cod Liver Oil 1000 MG CAPS Take 2,000 mg by mouth daily.     . Coenzyme Q10 (COQ10) 200 MG CAPS Take 200 mg by mouth daily.    . Continuous Blood Gluc Sensor (DEXCOM G6 SENSOR) MISC by Does not apply route.    . Continuous Blood Gluc Transmit (DEXCOM G6 TRANSMITTER) MISC by Does not apply route.    . Cyanocobalamin (VITAMIN  B-12) 5000 MCG SUBL Place 5,000 mcg under the tongue daily.    . DUREZOL 0.05 % EMUL Place 1 drop into the left eye daily.    Marland Kitchen ELIQUIS 5 MG TABS tablet TAKE 1 TABLET BY MOUTH TWICE A DAY 60 tablet 6  . glucose blood (ONE TOUCH ULTRA TEST) test strip Use asto check blood sugar 5 times a day 200 each 5  . insulin aspart (NOVOLOG) 100 UNIT/ML injection USE MAX  OF 60 UNITS DAILY VIA INSULIN PUMP. 180 mL 1  . losartan (COZAAR) 25 MG tablet Take 25 mg by mouth daily.    . NON FORMULARY Systemic Formulas Pancreas supplement, 1 per day "                    "           Metabo-Shake (Glycemic Support) 1 scoop per day Reishi w/ Curcumin 1 per day    . NON FORMULARY Systemic Formulas Glycemic Balance, 2 per  day "                  "             Eyes supplement, 2 per day "                  "              ROX (Antioxidant w/ Reservatrol), 1 per day    . NON FORMULARY Take 15 mLs daily by mouth. MAG CAL VANILLA LIQUID    . NON FORMULARY Take 1 tablet 2 (two) times daily by mouth. BioFunction "I"    . NON FORMULARY Take 1 tablet daily by mouth. Reishi w/ Curcuma    . NON FORMULARY 2 capsules daily. astachol ox    . Omega-3 Fatty Acids (SUPER OMEGA-3 PO) Take by mouth. Takes 2 capsules per day    . RESVERATROL PO Take 250 mg by mouth daily.     . Turmeric Curcumin 500 MG CAPS Take 1 tablet by mouth daily.      No current facility-administered medications for this visit.    Facility-Administered Medications Ordered in Other Visits  Medication Dose Route Frequency Provider Last Rate Last Dose  . topical emolient (BIAFINE) emulsion   Topical Daily Eppie Gibson, MD        Allergies:   Statins   Social History:  The patient  reports that she has never smoked. She has never used smokeless tobacco. She reports that she does not drink alcohol or use drugs.   Family History:  The patient's family history includes Breast cancer (age of onset: 79) in her mother; Diabetes (age of onset: 82) in her maternal uncle; Heart attack in her father; Heart failure in her mother; Prostate cancer in her maternal uncle.   ROS:  Please see the history of present illness.   All other systems are personally reviewed and negative.    Exam:    Vital Signs:  BP (!) 150/79   Pulse 78   Ht '5\' 4"'$  (1.626 m)   Wt 145 lb (65.8 kg)   BMI 24.89 kg/m   Well sounding, alert and conversant, regular work of breathing   Labs/Other Tests and Data Reviewed:    Recent Labs: 06/01/2019: ALT 14; BUN 13; Creatinine, Ser 0.71; Potassium 4.5; Sodium 132; TSH 1.46   Wt Readings from Last 3 Encounters:  09/21/19 145 lb (65.8 kg)  11/11/18 155 lb (70.3 kg)  08/28/18  153 lb (69.4 kg)      ASSESSMENT & PLAN:    1.  Persistent  atrial fibrillation No clinical recurrence Continue Eliquis for CHADS2VASC of 4 We discussed using AppleWatch or Kardia Mobile to monitor for AF recurrence  2.  HTN Elevated today 2/2 frustrations with telehealth Well controlled normally    Follow-up:  1 year with me   Patient Risk:  after full review of this patients clinical status, I feel that they are at moderate risk at this time.  Today, I have spent 15 minutes with the patient with telehealth technology discussing arrhythmia management .    Army Fossa, MD  09/21/2019 11:58 AM     [facility] [address] [place] [address] [phone] (office) [phone] (fax)

## 2019-09-30 DIAGNOSIS — H35351 Cystoid macular degeneration, right eye: Secondary | ICD-10-CM | POA: Diagnosis not present

## 2019-10-05 DIAGNOSIS — E1065 Type 1 diabetes mellitus with hyperglycemia: Secondary | ICD-10-CM | POA: Diagnosis not present

## 2019-10-15 DIAGNOSIS — E1065 Type 1 diabetes mellitus with hyperglycemia: Secondary | ICD-10-CM | POA: Diagnosis not present

## 2019-10-23 ENCOUNTER — Other Ambulatory Visit: Payer: Self-pay | Admitting: Internal Medicine

## 2019-10-23 NOTE — Telephone Encounter (Signed)
Prescription refill request for Eliquis received.  Last office visit: 09/21/2019, Allred Scr: 0.71, 06/01/2019 Age: 74 y.o. Weight: 70.3 kg   Prescription refill sent.

## 2019-11-05 DIAGNOSIS — E1065 Type 1 diabetes mellitus with hyperglycemia: Secondary | ICD-10-CM | POA: Diagnosis not present

## 2019-11-06 DIAGNOSIS — H35351 Cystoid macular degeneration, right eye: Secondary | ICD-10-CM | POA: Diagnosis not present

## 2019-11-16 DIAGNOSIS — E1065 Type 1 diabetes mellitus with hyperglycemia: Secondary | ICD-10-CM | POA: Diagnosis not present

## 2019-11-23 ENCOUNTER — Other Ambulatory Visit: Payer: Self-pay | Admitting: Endocrinology

## 2019-11-23 ENCOUNTER — Telehealth: Payer: Self-pay | Admitting: Endocrinology

## 2019-11-23 DIAGNOSIS — E038 Other specified hypothyroidism: Secondary | ICD-10-CM

## 2019-11-23 NOTE — Telephone Encounter (Signed)
Patient called stating she would like to add these order to her lab for Thursday :  T4 T3 uptake Reverse T3

## 2019-11-23 NOTE — Telephone Encounter (Signed)
Thyroid levels have been ordered

## 2019-11-26 ENCOUNTER — Other Ambulatory Visit (INDEPENDENT_AMBULATORY_CARE_PROVIDER_SITE_OTHER): Payer: PPO

## 2019-11-26 ENCOUNTER — Other Ambulatory Visit: Payer: Self-pay

## 2019-11-26 ENCOUNTER — Other Ambulatory Visit: Payer: Self-pay | Admitting: Endocrinology

## 2019-11-26 DIAGNOSIS — E871 Hypo-osmolality and hyponatremia: Secondary | ICD-10-CM

## 2019-11-26 DIAGNOSIS — E038 Other specified hypothyroidism: Secondary | ICD-10-CM | POA: Diagnosis not present

## 2019-11-26 DIAGNOSIS — E1065 Type 1 diabetes mellitus with hyperglycemia: Secondary | ICD-10-CM | POA: Diagnosis not present

## 2019-11-26 LAB — COMPREHENSIVE METABOLIC PANEL
ALT: 17 U/L (ref 0–35)
AST: 20 U/L (ref 0–37)
Albumin: 3.8 g/dL (ref 3.5–5.2)
Alkaline Phosphatase: 71 U/L (ref 39–117)
BUN: 14 mg/dL (ref 6–23)
CO2: 27 mEq/L (ref 19–32)
Calcium: 8.8 mg/dL (ref 8.4–10.5)
Chloride: 98 mEq/L (ref 96–112)
Creatinine, Ser: 0.76 mg/dL (ref 0.40–1.20)
GFR: 74.11 mL/min (ref >60.00)
Glucose, Bld: 163 mg/dL — ABNORMAL HIGH (ref 70–99)
Potassium: 4 mEq/L (ref 3.5–5.1)
Sodium: 132 mEq/L — ABNORMAL LOW (ref 135–145)
Total Bilirubin: 0.5 mg/dL (ref 0.2–1.2)
Total Protein: 6.5 g/dL (ref 6.0–8.3)

## 2019-11-26 LAB — HEMOGLOBIN A1C: Hgb A1c MFr Bld: 7.3 % — ABNORMAL HIGH (ref 4.6–6.5)

## 2019-11-26 LAB — T3, FREE: T3, Free: 2.7 pg/mL (ref 2.3–4.2)

## 2019-11-26 LAB — TSH: TSH: 2.68 u[IU]/mL (ref 0.35–4.50)

## 2019-11-26 LAB — T4, FREE: Free T4: 1.06 ng/dL (ref 0.60–1.60)

## 2019-11-27 ENCOUNTER — Other Ambulatory Visit: Payer: Self-pay

## 2019-12-01 ENCOUNTER — Other Ambulatory Visit: Payer: Self-pay

## 2019-12-01 ENCOUNTER — Encounter: Payer: Self-pay | Admitting: Endocrinology

## 2019-12-01 ENCOUNTER — Ambulatory Visit: Payer: PPO | Admitting: Endocrinology

## 2019-12-01 VITALS — BP 122/70 | HR 63 | Ht 64.0 in | Wt 154.0 lb

## 2019-12-01 DIAGNOSIS — E7800 Pure hypercholesterolemia, unspecified: Secondary | ICD-10-CM

## 2019-12-01 DIAGNOSIS — E038 Other specified hypothyroidism: Secondary | ICD-10-CM | POA: Diagnosis not present

## 2019-12-01 DIAGNOSIS — E1065 Type 1 diabetes mellitus with hyperglycemia: Secondary | ICD-10-CM | POA: Diagnosis not present

## 2019-12-01 NOTE — Patient Instructions (Signed)
Use exercise mode when needed  Call if getting low  You may register to get the vaccine at either of the 2 options:  [place] website   [email or web address]  Telephone number: [phone], Option 2   Also can register at the [facility] health website, wait listing may be an option  [email or web address] or call [phone]

## 2019-12-01 NOTE — Progress Notes (Signed)
Patient ID: Gina [name], female   DOB: June 21, 1946, 74 y.o.   MRN: [id number]      Reason for Appointment: Insulin Pump followup:   History of Present Illness   Diagnosis: Type 1 DIABETES MELITUS, date of diagnosis:  1984      HISTORY:   T-Slim PUMP SETTINGS are: Midnight = 0.4, 4 AM 1.15.  6 AM = 1.10, 8: 0 AM = 0.95, 11:30 AM = 1.0; 2 PM = 0. 9 0 and 6 PM = 0.7, 10 PM 0.55 Total basal insulin 19 units  Carbohydrate ratio 1: 8 at all meals Blood sugar target 100.  Correction factor 1: 50 Active insulin 4 hours  CGM low alert 80  Current management, blood sugar patterns and problems identified:  A1c slightly higher at 7.3 compared to 7.1   She is using the tandem pump with the control IQ  As before her difficulties with control either related to missed or inadequate boluses at times with more difficulty with hypoglycemia in the afternoons as discussed below  She likely forgets to bolus with certain meals and especially if she is eating out  May not bolus if carbohydrate intake is relatively low even with higher fat meals  She is concerned about low sugars late at night and may not always bolus enough to try and keep her blood sugars higher  Still has low normal or low sugars occasionally around midnight despite changing her settings last time  She is not aware of the exercise mode available on the pump  Also still has difficulty programming the pump herself  Not doing consistent exercise lately, does have a treadmill at home   CONTINUOUS GLUCOSE MONITORING RECORD INTERPRETATION    Dates of Recording: 2/2 through 2/15  Sensor description: Dexcom  Results statistics:   CGM use % of time  95  Average and SD  145  Time in range    80    %  % Time Above 180  19  % Time above 250   % Time Below target  1    PRE-MEAL  night  6 AM-12 PM  afternoon  6 PM + Overall  Glucose range:      46-301  Mean/median:  132  139  156  151    POST-MEAL PC  Breakfast PC Lunch PC Dinner  Glucose range:     Mean/median:       Glycemic patterns summary: Blood sugars are within the target range on an average most of the time HIGHEST blood sugars on an average are in the mid afternoon and after dinner LOWEST blood sugars on an average are right around midnight, occasionally at 8 AM Postprandial control is variable  Hyperglycemic episodes are occurring mostly in the mid afternoon likely postprandial with occasional rise in blood sugars after evening meal or early part of the night  Hypoglycemic episodes occurred transiently and infrequently and mostly around 1-2 AM, 8 AM and 11 PM  Overnight periods: Blood sugars are variable with low normal readings around midnight but overall rising after about 2 AM  Preprandial periods: Blood sugars are averaging about 120 at breakfast and lunch but may be 1 40-1 50 before dinner  Postprandial periods:   After breakfast:   Difficult to assess as she frequently does not have a meal bolus Otherwise blood sugars are usually rising modestly above 180 at times  After lunch:   Blood sugars may occasionally rise over 200 but on an average are  usually not over 180, rarely may be low normal postprandially After dinner: Blood sugars on an average are not rising excessively but do show modest variability   Previous data:   CGM use  13+ days  Average and SD   Time in range     77   %  % Time Above 180  21  % Time above 250   % Time Below target  2     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 Sodium 11/26/2019 132* 135 - 145 mEq/L Final  . Potassium 11/26/2019 4.0  3.5 - 5.1 mEq/L Final  . Chloride 11/26/2019 98  96 - 112 mEq/L Final  . CO2 11/26/2019 27  19 - 32 mEq/L Final  . Glucose, Bld 11/26/2019 163* 70 - 99 mg/dL Final  . BUN 11/26/2019 14  6 - 23 mg/dL Final  . Creatinine, Ser 11/26/2019 0.76  0.40 - 1.20 mg/dL Final  . Total Bilirubin 11/26/2019 0.5  0.2 - 1.2 mg/dL Final  . Alkaline Phosphatase 11/26/2019 71  39 - 117 U/L Final  . AST 11/26/2019 20  0 - 37 U/L Final  . ALT 11/26/2019 17  0 - 35 U/L Final  . Total Protein 11/26/2019 6.5  6.0 - 8.3 g/dL Final  . Albumin 11/26/2019 3.8  3.5 - 5.2 g/dL Final  . GFR 11/26/2019 74.11  >60.00 mL/min Final  . Calcium 11/26/2019 8.8  8.4 - 10.5 mg/dL Final  Lab on 11/26/2019  Component Date Value Ref Range Status  . T3, Free 11/26/2019 2.7  2.3 - 4.2 pg/mL Final  . Free T4 11/26/2019 1.06  0.60 - 1.60 ng/dL Final   Comment: Specimens from patients who are undergoing biotin therapy and /or ingesting biotin supplements may contain high levels of biotin.  The higher biotin concentration in these specimens interferes with this Free T4 assay.  Specimens that contain high levels  of biotin may cause false high results for this Free T4 assay.  Please interpret results in light of the total clinical presentation of the patient.    Marland Kitchen TSH 11/26/2019 2.68  0.35 - 4.50 uIU/mL Final  . Hgb A1c MFr Bld 11/26/2019 7.3* 4.6 - 6.5 % Final   Glycemic Control Guidelines for People with Diabetes:Non Diabetic:  <6%Goal of Therapy: <7%Additional Action Suggested:  >8%      Allergies as of 12/01/2019      Reactions   Statins Other (See Comments)   Leg pain & severe fatigue      Medication List       Accurate as of December 01, 2019 11:27 AM. If you have any questions, ask your nurse or doctor.        ASHWAGANDHA PO Take 1 tablet  by mouth daily.   Cod Liver Oil 1000 MG Caps Take 2,000 mg by mouth daily.   CoQ10 200 MG Caps  Take 200 mg by mouth daily.   Dexcom G6 Sensor Misc by Does not apply route.   Dexcom G6 Transmitter Misc by Does not apply route.   Durezol 0.05 % Emul Generic drug: Difluprednate Place 1 drop into the left eye daily.   Eliquis 5 MG Tabs tablet Generic drug: apixaban TAKE 1 TABLET BY MOUTH TWICE A DAY   glucose blood test strip Commonly known as: ONE TOUCH ULTRA TEST Use asto check blood sugar 5 times a day   insulin aspart 100 UNIT/ML injection Commonly known as: NovoLOG USE MAX  OF 60 UNITS DAILY VIA INSULIN PUMP.   losartan 25 MG tablet Commonly known as: COZAAR Take 25 mg by mouth daily.   NON FORMULARY Systemic Formulas Pancreas supplement, 1 per day "                    "           Metabo-Shake (Glycemic Support) 1 scoop per day Reishi w/ Curcumin 1 per day   NON FORMULARY Systemic Formulas Glycemic Balance, 2 per day "                  "             Eyes supplement, 2 per day "                  "              ROX (Antioxidant w/ Reservatrol), 1 per day   NON FORMULARY Take 15 mLs daily by mouth. MAG CAL VANILLA LIQUID   NON FORMULARY Take 1 tablet 2 (two) times daily by mouth. BioFunction "I"   NON FORMULARY Take 1 tablet daily by mouth. Reishi w/ Curcuma   NON FORMULARY 2 capsules daily. astachol ox   RESVERATROL PO Take 250 mg by mouth daily.   SUPER OMEGA-3 PO Take by mouth. Takes 2 capsules per day   Turmeric Curcumin 500 MG Caps Take 1 tablet by mouth daily.   Vitamin B-12 5000 MCG Subl Place 5,000 mcg under the tongue daily.   vitamin C 1000 MG tablet Take 1,000 mg by mouth daily.   VITAMIN D PO Take 5,000 mcg by mouth daily.       Allergies:  Allergies  Allergen Reactions  . Statins Other (See Comments)    Leg pain & severe fatigue    Past Medical History:  Diagnosis Date  . Anxiety    new dx  . Atrial fibrillation ([place])     a. 03/2015 s/p RFCA;  b. CHA2DS2VASc = 5-->Eliquis.  . 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 Diabetes mellitus type 1 ([facility])    Insulin pump  . Diastolic CHF ([place])    a. 10/6107 - due to AF RVR;  b. 03/2015 Echo: EF 60-65%, no rwma.  . Hyperlipidemia   . Hypertension   . Hypothyroidism   . Insulin pump in place   . Ovarian cyst   . S/P radiation therapy 04/10/12- 05/26/2012   left Breast and Axilla / 50 gy / 25 Fractions with Left Breast Boost / 10 Gy / 5 Fractions  . SIADH (syndrome of inappropriate ADH production) ([place])   . Use of letrozole ([place]) 06/13/12    Past Surgical History:  Procedure Laterality Date  . APPENDECTOMY    . 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 COLONOSCOPY     1 polyp   . ELECTROPHYSIOLOGIC STUDY N/A 04/08/2015   PVI + CTI ablation + posterior Box lesion + Atach ablation by [name]  . OVARIAN CYST SURGERY    . TEE WITHOUT CARDIOVERSION N/A 04/07/2015   Procedure: TRANSESOPHAGEAL ECHOCARDIOGRAM (TEE);  Surgeon: Thayer Headings, MD;  Location: [facility] ENDOSCOPY;  Service: Cardiovascular;  Laterality: N/A;  . TONSILLECTOMY      Family History  Problem Relation Age of Onset  . Breast cancer Mother 64  . Heart failure Mother   . Prostate cancer Maternal Uncle        died in his 40s  . Heart attack Father   . Diabetes Maternal Uncle 15    Social History:  reports that she has never smoked. She has never used smokeless tobacco. She reports that she does not drink alcohol or use drugs.  REVIEW of systems:     She has a history of mild hypothyroidism followed by a holistic physician Continues on a regimen of '60mg'$  [place] Thyroid  TSH normal along with T4 and T3  Lab Results  Component Value Date    TSH 2.68 11/26/2019   TSH 1.46 06/01/2019   TSH 1.57 03/02/2019   FREET4 1.06 11/26/2019   FREET4 1.05 03/02/2019   FREET4 1.10 10/14/2018   Lab Results  Component Value Date   T3FREE 2.7 11/26/2019   T3FREE 4.1 03/02/2019      HYPERCHOLESTEROLEMIA: Her LDL has been persistently high and refuses to take a statin drug including pravastatin even though it had no side effects previously.  She was previously trying to use red rice yeast , none now Previously her LDL particle number is consistently high at 1621  She was given a prescription for Crestor which she tried for 3-4 weeks but did not continue because she felt it was giving her headaches and making her tired  She has been using natural substances and essential oils for her hypercholesterolemia  Her LDL history is as follows:   Lab Results  Component Value Date   CHOL 223 (H) 06/01/2019   CHOL 240 (H) 03/02/2019   CHOL 230 (H) 10/14/2018   Lab Results  Component Value Date   HDL 64.80 06/01/2019   HDL 72.10 03/02/2019   HDL 68.70 10/14/2018   Lab Results  Component Value Date   LDLCALC 145 (H) 06/01/2019   LDLCALC 156 (H) 03/02/2019   [place] 150 (H) 10/14/2018   Lab Results  Component Value Date   TRIG 64.0 06/01/2019   TRIG 59.0 03/02/2019   TRIG 57.0 10/14/2018   Lab Results  Component Value Date   CHOLHDL 3 06/01/2019   CHOLHDL 3 03/02/2019   CHOLHDL 3 10/14/2018   Lab Results  Component Value Date   LDLDIRECT 148.4 08/06/2013   . She takes several herbal supplements as before  HYPONATREMIA: This is mild and persistent and etiology unknown, not on any diuretics or SSRIs Sodium stays consistent at 132 Urine osmolality as of 04/2015 was 618  EXAM:  BP 122/70 (BP Location: Right Arm, Patient Position: Sitting, Cuff Size: Normal)   Pulse 63   Ht '5\' 4"'$  (1.626 m)   Wt 154 lb (69.9 kg)   SpO2 98%   BMI 26.43 kg/m   Diabetic Foot Exam - Simple   Simple Foot Form Diabetic Foot exam was  performed with the following findings: Yes   Visual Inspection No deformities, no ulcerations, no other skin breakdown bilaterally: Yes Sensation Testing Intact to touch and monofilament testing bilaterally: Yes Pulse Check Posterior Tibialis and Dorsalis pulse intact bilaterally: Yes Comments      ASSESSMENT:  DIABETES on T-Slim pump  See history of present illness for detailed discussion of current diabetes management, blood sugar patterns and problems identified  Her A1c is 7.3  Her blood sugars are generally fairly well controlled A1c is slightly higher but average CGM for the last 2 weeks is almost the same as last time at 145 CGM interpretation is discussed Her abnormal patterns along with details of the management with her insulin pump was discussed in detail She was made aware of her hyperglycemia being caused by not covering her meals adequately or missing boluses periodically, this is a recurrent problem She does appear to have excessive basal delivery around midnight despite basal rates being low at 0.4 and use of the control IQ  She likely keeps her blood sugars higher than normal at bedtime because of fear of hypoglycemia Currently not exercising much but planning to either walk outside or use her treadmill Discussed that she is not using the exercise mode on the pump and explained to her how this works   RECOMMENDATIONS:  Basal rate changes as follows: 12 AM-2 AM = 0.3, 2 AM-4 AM = 0.45, 4 AM-8 AM = 1.10 and 10 PM-12 AM = 0.4 She will be given instruction sheets by the nurse educator today to help her understand how to change her basal rates herself  Use exercise mode for getting on treadmill or doing outside work involving physical activity Continue to use sleep more at night She will make sure she is covering for all carbohydrate intake and extra for any higher fat intake or desserts She needs to try and bolus for all intake even when she is eating out or with  other people  Mild asymptomatic hyponatremia: No specific treatment or evaluation needed, needs to avoid excess fluid intake, previously appeared to have mild SIADH  Hyperlipidemia: To recheck labs on the next visit, as before she refuses treatment May consider a nonstatin drug on the next visit  Hypothyroidism: Adequately controlled, not clear if she has primary or secondary hypothyroidism, forwarded labs to her holistic physician  Follow-up in 3 months  She will try to get her Covid vaccine scheduled and given her information on how to do this    Elayne Snare 12/01/2019, 11:27 AM

## 2019-12-06 DIAGNOSIS — E1065 Type 1 diabetes mellitus with hyperglycemia: Secondary | ICD-10-CM | POA: Diagnosis not present

## 2019-12-16 DIAGNOSIS — E1065 Type 1 diabetes mellitus with hyperglycemia: Secondary | ICD-10-CM | POA: Diagnosis not present

## 2020-01-03 DIAGNOSIS — E1065 Type 1 diabetes mellitus with hyperglycemia: Secondary | ICD-10-CM | POA: Diagnosis not present

## 2020-01-05 ENCOUNTER — Telehealth: Payer: Self-pay | Admitting: Nutrition

## 2020-01-05 NOTE — Telephone Encounter (Signed)
Patsy reports that sensors readings on pump keep going off and on, and then not lasting for 10 days.  She was told

## 2020-01-07 ENCOUNTER — Ambulatory Visit: Payer: PPO | Attending: Internal Medicine

## 2020-01-07 DIAGNOSIS — Z23 Encounter for immunization: Secondary | ICD-10-CM

## 2020-01-07 NOTE — Progress Notes (Signed)
   Covid-19 Vaccination [facility]  Name:  Gina [name]    MRN: [id number] DOB: 22-Jan-1946  01/07/2020  [name] was observed post Covid-19 immunization for 15 minutes without incident. She was provided with Vaccine Information Sheet and instruction to access the V-Safe system.   [name] was instructed to call 911 with any severe reactions post vaccine: Marland Kitchen Difficulty breathing  . Swelling of face and throat  . A fast heartbeat  . A bad rash all over body  . Dizziness and weakness   Immunizations Administered    Name Date Dose VIS Date Route   Moderna COVID-19 Vaccine 01/07/2020  9:23 AM 0.5 mL 09/15/2019 Intramuscular   Manufacturer: Moderna   Lot: [id number]   [id number]

## 2020-01-07 NOTE — Telephone Encounter (Signed)
Continued from previous note, no time to complete previous note:  She was told to make sure the front of the pump is exposed outward to make it easier to except the dexcom transmitter signal.  Also told her to Call Tandem to see if they have any more suggestions for her.  Suggested that, at times, signal gets interupped and she will need to wait 20-30 min. For it to return.  Told her that she will need to test blood sugar if needing a blood sugar readings for meat time or for security.   She agreed to to do this,and had no final questions.

## 2020-01-28 DIAGNOSIS — E109 Type 1 diabetes mellitus without complications: Secondary | ICD-10-CM | POA: Diagnosis not present

## 2020-01-28 DIAGNOSIS — E1065 Type 1 diabetes mellitus with hyperglycemia: Secondary | ICD-10-CM | POA: Diagnosis not present

## 2020-01-28 DIAGNOSIS — Z961 Presence of intraocular lens: Secondary | ICD-10-CM | POA: Diagnosis not present

## 2020-01-28 DIAGNOSIS — H5213 Myopia, bilateral: Secondary | ICD-10-CM | POA: Diagnosis not present

## 2020-02-03 DIAGNOSIS — E1065 Type 1 diabetes mellitus with hyperglycemia: Secondary | ICD-10-CM | POA: Diagnosis not present

## 2020-02-10 ENCOUNTER — Ambulatory Visit: Payer: PPO

## 2020-02-29 ENCOUNTER — Other Ambulatory Visit (INDEPENDENT_AMBULATORY_CARE_PROVIDER_SITE_OTHER): Payer: PPO

## 2020-02-29 ENCOUNTER — Other Ambulatory Visit: Payer: Self-pay

## 2020-02-29 DIAGNOSIS — E038 Other specified hypothyroidism: Secondary | ICD-10-CM

## 2020-02-29 DIAGNOSIS — E1065 Type 1 diabetes mellitus with hyperglycemia: Secondary | ICD-10-CM

## 2020-02-29 LAB — HEMOGLOBIN A1C: Hgb A1c MFr Bld: 7.3 % — ABNORMAL HIGH (ref 4.6–6.5)

## 2020-02-29 LAB — COMPREHENSIVE METABOLIC PANEL
ALT: 17 U/L (ref 0–35)
AST: 22 U/L (ref 0–37)
Albumin: 4 g/dL (ref 3.5–5.2)
Alkaline Phosphatase: 58 U/L (ref 39–117)
BUN: 15 mg/dL (ref 6–23)
CO2: 29 mEq/L (ref 19–32)
Calcium: 8.7 mg/dL (ref 8.4–10.5)
Chloride: 92 mEq/L — ABNORMAL LOW (ref 96–112)
Creatinine, Ser: 0.82 mg/dL (ref 0.40–1.20)
GFR: 67.84 mL/min (ref >60.00)
Glucose, Bld: 243 mg/dL — ABNORMAL HIGH (ref 70–99)
Potassium: 4.6 mEq/L (ref 3.5–5.1)
Sodium: 126 mEq/L — ABNORMAL LOW (ref 135–145)
Total Bilirubin: 0.7 mg/dL (ref 0.2–1.2)
Total Protein: 6.4 g/dL (ref 6.0–8.3)

## 2020-02-29 LAB — LIPID PANEL
Cholesterol: 232 mg/dL — ABNORMAL HIGH (ref 0–200)
HDL: 66.3 mg/dL (ref >39.00)
LDL Cholesterol: 150 mg/dL — ABNORMAL HIGH (ref 0–99)
NonHDL: 165.91
Total CHOL/HDL Ratio: 4
Triglycerides: 78 mg/dL (ref 0.0–149.0)
VLDL: 15.6 mg/dL (ref 0.0–40.0)

## 2020-02-29 LAB — T4, FREE: Free T4: 1.05 ng/dL (ref 0.60–1.60)

## 2020-02-29 LAB — T3, FREE: T3, Free: 2.4 pg/mL (ref 2.3–4.2)

## 2020-02-29 LAB — TSH: TSH: 3.5 u[IU]/mL (ref 0.35–4.50)

## 2020-03-03 ENCOUNTER — Ambulatory Visit (INDEPENDENT_AMBULATORY_CARE_PROVIDER_SITE_OTHER): Payer: PPO | Admitting: Endocrinology

## 2020-03-03 ENCOUNTER — Encounter: Payer: Self-pay | Admitting: Endocrinology

## 2020-03-03 ENCOUNTER — Other Ambulatory Visit: Payer: Self-pay

## 2020-03-03 VITALS — BP 140/70 | HR 67 | Ht 64.0 in | Wt 154.2 lb

## 2020-03-03 DIAGNOSIS — E1065 Type 1 diabetes mellitus with hyperglycemia: Secondary | ICD-10-CM | POA: Diagnosis not present

## 2020-03-03 DIAGNOSIS — E871 Hypo-osmolality and hyponatremia: Secondary | ICD-10-CM

## 2020-03-03 DIAGNOSIS — E038 Other specified hypothyroidism: Secondary | ICD-10-CM | POA: Diagnosis not present

## 2020-03-03 NOTE — Patient Instructions (Signed)
Cut down fluids 30-40%   Follow up with [name]   Add extra bolus for any hi fat intake or sweets

## 2020-03-03 NOTE — Progress Notes (Signed)
Patient ID: Gina [name], female   DOB: 08/06/1946, 74 y.o.   MRN: [id number]      Reason for Appointment: Insulin Pump followup:   History of Present Illness   Diagnosis: Type 1 DIABETES MELITUS, date of diagnosis:  1984      HISTORY:   T-Slim PUMP SETTINGS are:  12 AM-2 AM = 0.3, 2 AM-4 AM = 0.45, 4 AM-8 AM = 1.10, 8 AM-11:30 AM = 0.9, 11:30 AM = 1.0; 2 PM = 0. 90, 6 PM = 0.7,10 PM-12 AM = 0.4  Total basal insulin 19 units  Carbohydrate ratio 1: 8 at all meals Blood sugar target 100.  Correction factor 1: 50 Active insulin 4 hours  CGM low alert 80  Current management, blood sugar patterns and problems identified:  A1c again slightly higher at 7.3 compared to 7.1 previously   She has not had any new difficulties with using her pump for the CGM  Blood sugar patterns are discussed below  From what her postprandial readings are variably higher based on her estimation of her carbohydrate intake, not covering all of her food intake nor not allowing for any higher fat content  Has a better target achievement of 86 compared to 80 previously  Also has minimized low sugars including overnight with adjusting basal rates and will get alarms overnight only occasionally  Not clear if she may have missed a few boluses occasionally since sometimes she only has a low carbohydrate meals and no bolus  Until recently has been trying to do some walking  Weight has stabilized   CONTINUOUS GLUCOSE MONITORING RECORD INTERPRETATION    Dates of Recording: Last 2 weeks  Sensor description: G6  Results statistics:   CGM use % of time  98  Average and SD  144+/-34  Time in range       36%  % Time Above 180  14  % Time above 250   % Time Below target     PRE-MEAL  night  6 AM-12 PM  afternoon  6 PM + Overall  Glucose range:       Mean/median:  138, was 132  149, was 139  151, was 156  140, was 151    Glycemic patterns summary: Overall blood sugar fluctuation is  low but more so in the early mornings and late afternoons. Highest blood sugars on an average are around 9 AM and 2 PM but not consistently Average blood sugars are not outside target range at any time Hypoglycemia is minimal  Hyperglycemic episodes are occurring sporadically after breakfast and lunch or late afternoon but not consistently  Hypoglycemic episodes not recorded  Overnight periods: Blood sugars are very stable through the night with mild fluctuation and lowest sugars around 6-7 AM  Preprandial periods: Blood sugars are excellent at breakfast and usually at lunch also with some variability at dinnertime  Postprandial periods:   After breakfast: She does have occasional high readings over 200 at times but generally no excessive rise on an average After lunch: Blood sugar excursion is somewhat more on an average but significant variability exists  After dinner: Most of her sugars are relatively flat especially her carbohydrate intake being relatively smaller    Previous data:   CGM use % of time  95  Average and SD  145  Time in range    80    %  % Time Above 180  19  % Time above 250   %  Time Below target  1    PRE-MEAL  night  6 AM-12 PM  afternoon  6 PM + Overall  Glucose range:      46-301  Mean/median:  132  139  156  151    POST-MEAL PC Breakfast PC Lunch PC Dinner  Glucose range:     Mean/median:       Glycemic patterns summary: Blood sugars are within the target range on an average most of the time HIGHEST blood sugars on an average are in the mid afternoon and after dinner LOWEST blood sugars on an average are right around midnight, occasionally at 8 AM Postprandial control is variable  Hyperglycemic episodes are occurring mostly in the mid afternoon likely postprandial with occasional rise in blood sugars after evening meal or early part of the night  Hypoglycemic episodes occurred transiently and infrequently and mostly around 1-2 AM, 8 AM and 11  PM  Overnight periods: Blood sugars are variable with low normal readings around midnight but overall rising after about 2 AM  Preprandial periods: Blood sugars are averaging about 120 at breakfast and lunch but may be 1 40-1 50 before dinner  Postprandial periods:   After breakfast:   Difficult to assess as she frequently does not have a meal bolus Otherwise blood sugars are usually rising modestly above 180 at times  After lunch:   Blood sugars may occasionally rise over 200 but on an average are usually not over 180, rarely may be low normal postprandially After dinner: Blood sugars on an average are not rising excessively but do show modest variability  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 T3, Free 02/29/2020 2.4  2.3 - 4.2 pg/mL Final  . Free T4 02/29/2020 1.05  0.60 - 1.60 ng/dL Final   Comment: Specimens from patients who are undergoing biotin therapy and /or ingesting biotin supplements may contain high levels of biotin.  The higher biotin concentration in these specimens interferes with this Free T4 assay.  Specimens that contain high levels  of biotin may cause false high results for this Free T4 assay.  Please interpret results in light of the total clinical presentation of the patient.    Marland Kitchen TSH 02/29/2020 3.50  0.35 - 4.50 uIU/mL Final  . Cholesterol 02/29/2020 232* 0 - 200 mg/dL Final   ATP III Classification       Desirable:  < 200 mg/dL                Borderline High:  200 - 239 mg/dL          High:  > = 240 mg/dL  . Triglycerides 02/29/2020 78.0  0.0 - 149.0 mg/dL Final   Normal:  <150 mg/dLBorderline High:  150 - 199 mg/dL  . HDL 02/29/2020 66.30  >39.00 mg/dL Final  . VLDL 02/29/2020 15.6  0.0 - 40.0 mg/dL Final  .  LDL Cholesterol 02/29/2020 150* 0 - 99 mg/dL Final  . Total CHOL/HDL Ratio 02/29/2020 4   Final                  Men          Women1/2 Average Risk     3.4          3.3Average Risk          5.0          4.42X Average Risk          9.6          7.13X Average Risk          15.0          11.0                      . NonHDL 02/29/2020 165.91   Final   NOTE:  Non-HDL goal should be 30 mg/dL higher than patient's LDL goal (i.e. LDL goal of < 70 mg/dL, would have non-HDL goal of < 100 mg/dL)  . Sodium 02/29/2020 126* 135 - 145 mEq/L Final  . Potassium 02/29/2020 4.6  3.5 - 5.1 mEq/L Final  . Chloride 02/29/2020 92* 96 - 112 mEq/L Final  . CO2 02/29/2020 29  19 - 32 mEq/L Final  . Glucose, Bld 02/29/2020 243* 70 - 99 mg/dL Final  . BUN 02/29/2020 15  6 - 23 mg/dL Final  . Creatinine, Ser 02/29/2020 0.82  0.40 - 1.20 mg/dL Final  . Total Bilirubin 02/29/2020 0.7  0.2 - 1.2 mg/dL Final  . Alkaline Phosphatase 02/29/2020 58  39 - 117 U/L Final  . AST 02/29/2020 22  0 - 37 U/L Final  . ALT 02/29/2020 17  0 - 35 U/L Final  . Total Protein 02/29/2020 6.4  6.0 - 8.3 g/dL Final  . Albumin 02/29/2020 4.0  3.5 - 5.2 g/dL Final  . GFR 02/29/2020 67.84  >60.00 mL/min Final  . Calcium 02/29/2020 8.7  8.4 - 10.5 mg/dL Final  . Hgb A1c MFr Bld 02/29/2020 7.3* 4.6 - 6.5 % Final   Glycemic Control Guidelines for People with Diabetes:Non Diabetic:  <6%Goal of Therapy: <7%Additional Action Suggested:  >8%      Allergies as of 03/03/2020      Reactions   Rosuvastatin Calcium    Headaches, fatigue      Medication List       Accurate as of Mar 03, 2020 10:16 AM. If you have any questions, ask your nurse or doctor.        ASHWAGANDHA  PO Take 1 tablet by mouth daily.   Cod Liver Oil 1000 MG Caps Take 2,000 mg by mouth daily.   CoQ10 200 MG Caps Take 200 mg by mouth daily.   Dexcom G6 Sensor Misc by Does not apply route.   Dexcom G6 Transmitter Misc by Does not apply route.   Durezol 0.05 % Emul Generic drug: Difluprednate Place 1 drop into the left eye daily.   Eliquis 5 MG Tabs tablet Generic drug: apixaban TAKE 1 TABLET BY MOUTH TWICE A DAY   glucose blood test strip Commonly known as: ONE TOUCH ULTRA TEST Use asto check blood sugar 5 times a day   insulin aspart 100 UNIT/ML injection Commonly known as: NovoLOG USE MAX  OF 60 UNITS DAILY VIA INSULIN PUMP.   losartan 25 MG tablet Commonly known as: COZAAR Take 25 mg by mouth daily.   NON  FORMULARY Systemic Formulas Pancreas supplement, 1 per day "                    "           Metabo-Shake (Glycemic Support) 1 scoop per day Reishi w/ Curcumin 1 per day   NON FORMULARY Systemic Formulas Glycemic Balance, 2 per day "                  "             Eyes supplement, 2 per day "                  "              ROX (Antioxidant w/ Reservatrol), 1 per day   NON FORMULARY Take 15 mLs daily by mouth. MAG CAL VANILLA LIQUID   NON FORMULARY Take 1 tablet 2 (two) times daily by mouth. BioFunction "I"   NON FORMULARY Take 1 tablet daily by mouth. Reishi w/ Curcuma   NON FORMULARY 2 capsules daily. astachol ox   RESVERATROL PO Take 250 mg by mouth daily.   SUPER OMEGA-3 PO Take by mouth. Takes 2 capsules per day   Turmeric Curcumin 500 MG Caps Take 1 tablet by mouth daily.   Vitamin B-12 5000 MCG Subl Place 5,000 mcg under the tongue daily.   vitamin C 1000 MG tablet Take 1,000 mg by mouth daily.   VITAMIN D PO Take 5,000 mcg by mouth daily.       Allergies:  Allergies  Allergen Reactions  . Rosuvastatin Calcium     Headaches, fatigue    Past Medical History:  Diagnosis Date  . Anxiety    new dx  . Atrial fibrillation  ([place])    a. 03/2015 s/p RFCA;  b. CHA2DS2VASc = 5-->Eliquis.  . 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 Diabetes mellitus type 1 ([facility])    Insulin pump  . Diastolic CHF ([place])    a. 06/5843 - due to AF RVR;  b. 03/2015 Echo: EF 60-65%, no rwma.  . Hyperlipidemia   . Hypertension   . Hypothyroidism   . Insulin pump in place   . Ovarian cyst   . S/P radiation therapy 04/10/12- 05/26/2012   left Breast and Axilla / 50 gy / 25 Fractions with Left Breast Boost / 10 Gy / 5 Fractions  . SIADH (syndrome of inappropriate ADH production) ([place])   . Use of letrozole ([place]) 06/13/12    Past Surgical History:  Procedure Laterality Date  . APPENDECTOMY    . 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 COLONOSCOPY     1 polyp   . ELECTROPHYSIOLOGIC STUDY N/A 04/08/2015   PVI + CTI ablation + posterior Box lesion + Atach ablation by [name]  . OVARIAN CYST SURGERY    . TEE WITHOUT CARDIOVERSION N/A 04/07/2015   Procedure: TRANSESOPHAGEAL ECHOCARDIOGRAM (TEE);  Surgeon: Thayer Headings, MD;  Location: [facility] ENDOSCOPY;  Service: Cardiovascular;  Laterality: N/A;  . TONSILLECTOMY      Family History  Problem Relation Age of Onset  . Breast cancer Mother 54  . Heart failure Mother   . Prostate cancer Maternal Uncle  died in his 71s  . Heart attack Father   . Diabetes Maternal Uncle 15    Social History:  reports that she has never smoked. She has never used smokeless tobacco. She reports that she does not drink alcohol or use drugs.  REVIEW of systems:     She has a history of mild hypothyroidism followed by a holistic physician Continues on a regimen of '60mg'$  Armour Thyroid  TSH normal along with T4 and T3  Lab Results  Component Value  Date   TSH 3.50 02/29/2020   TSH 2.68 11/26/2019   TSH 1.46 06/01/2019   FREET4 1.05 02/29/2020   FREET4 1.06 11/26/2019   FREET4 1.05 03/02/2019   Lab Results  Component Value Date   T3FREE 2.4 02/29/2020   T3FREE 2.7 11/26/2019      HYPERCHOLESTEROLEMIA: Her LDL has been persistently high and refuses to take a statin drug including pravastatin even though it had no side effects previously.  She was previously trying to use red rice yeast , none now Previously her LDL particle number is consistently high at 1621  She was given a prescription for Crestor which she tried for 3-4 weeks but did not continue because she felt it was giving her headaches and making her tired  She prefers to use natural substances and essential oils for her hypercholesterolemia  Her LDL levels are about the same as before   Lab Results  Component Value Date   CHOL 232 (H) 02/29/2020   CHOL 223 (H) 06/01/2019   CHOL 240 (H) 03/02/2019   Lab Results  Component Value Date   HDL 66.30 02/29/2020   HDL 64.80 06/01/2019   HDL 72.10 03/02/2019   Lab Results  Component Value Date   LDLCALC 150 (H) 02/29/2020   LDLCALC 145 (H) 06/01/2019   LDLCALC 156 (H) 03/02/2019   Lab Results  Component Value Date   TRIG 78.0 02/29/2020   TRIG 64.0 06/01/2019   TRIG 59.0 03/02/2019   Lab Results  Component Value Date   CHOLHDL 4 02/29/2020   CHOLHDL 3 06/01/2019   CHOLHDL 3 03/02/2019   Lab Results  Component Value Date   LDLDIRECT 148.4 08/06/2013   . She takes several herbal supplements as before  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 been longstanding, not on any diuretics or SSRIs  Urine osmolality as of 04/2015 was 618 Although her sodium had been relatively stable at 132 it is down to 126 now She drinks at least half a gallon of water along with other drinks and likes to increase her fluid intake because of her previous cardiac history  Lab Results  Component Value Date   NA 126 (L) 02/29/2020   K  4.6 02/29/2020   CL 92 (L) 02/29/2020   CO2 29 02/29/2020    She had an episode of faintness and had to lie down, she says her blood pressure was down to 449 systolic and pulse in the 50s on the weekend.  However she has not contacted her cardiologist for further evaluation   EXAM:  BP 140/70 (BP Location: Left Arm, Patient Position: Sitting, Cuff Size: Normal)   Pulse 67   Ht '5\' 4"'$  (1.626 m)   Wt 154 lb 3.2 oz (69.9 kg)   SpO2 99%   BMI 26.47 kg/m   No ankle edema  ASSESSMENT:  DIABETES on T-Slim pump  See history of present illness for detailed discussion of current diabetes management, blood sugar patterns and problems identified  Her A1c is  7.3 as before  Her control appears to be more stable with 86 % of her blood sugars in the last 2 weeks within target range Also no hypoglycemia She does have periodic high readings after meals and discussed coverage of various meals including high-fat meals, adding on the carbohydrates including desserts and snacks and also bolusing before starting to eat Since her blood sugar rise after meals is not consistent will not change her carbohydrate ratio as yet May need a higher ratio at breakfast at least  Blood sugar patterns were discussed with patient as well as day-to-day management and ways to improve her hypoglycemic episodes Currently not getting any tendency to hypoglycemia or low normal readings   RECOMMENDATIONS:  Basal rate changes as follows: 12 AM-2 AM = 0.3, 2 AM-4 AM = 0.45, 4 AM-8 AM = 1.10 and 10 PM-12 AM = 0.4 She will be given instruction sheets by the nurse educator today to help her understand how to change her basal rates herself  Use exercise mode for getting on treadmill or doing outside work involving physical activity Continue to use sleep more at night She will make sure she is covering for all carbohydrate intake and extra for any higher fat intake or desserts She needs to try and bolus for all intake even  when she is eating out or with other people  Asymptomatic hyponatremia: Previously had evidence of SIADH She has a significantly low sodium of 126 but not appearing to have any recent symptoms Encourage her to cut down her fluid intake 30 to 40% as this is likely aggravating her low sodium recently She will come back in a month for follow-up  Hyperlipidemia: No change in lipids and she is as before not open to any treatment  Episode of near syncope and bradycardia: Encouraged her to call her cardiologist and have a follow-up, likely needs Holter monitor  Hypothyroidism: Adequately controlled, not clear if she has primary or secondary hypothyroidism, reportedly on 60 mg Armour Thyroid  Follow-up in 3 months  She has not had her second Covid vaccine scheduled for fear of side effects and appears to have been correct information available Given her the new information pamphlet to review and encouraged her to get the second shot for complete immunity    Elayne Snare 03/03/2020, 10:16 AM

## 2020-03-04 ENCOUNTER — Telehealth: Payer: Self-pay | Admitting: Internal Medicine

## 2020-03-04 DIAGNOSIS — E1065 Type 1 diabetes mellitus with hyperglycemia: Secondary | ICD-10-CM | POA: Diagnosis not present

## 2020-03-04 NOTE — Telephone Encounter (Signed)
Returned call to pt. She reports BP Sunday as:  7:25 am 106/56  7:55 am 100/58  8:30 am 134/73  9:30 am 134/70  11:40 am 128/74 States Dr Dwyane Dee told her to call Dr. Rayann Heman.to let him know. Her last episode was a year ago -- same problem w/ low BPs.  She usually sits down and raises her legs above heart and waits for it to calm down.  Explained to pt that typically Dr. Rayann Heman doesn't follow BPs.  She seemed to think he was. Explained that he has been seeing her for 5 years and not prescribed her Losartan (prescribed by NP who pt states is no longer working).  This statement seemed to trigger understanding as to why I am telling her that I don't think he is following her BPs.  She understands she may need to go to PCP about this.   Aware that [name] and his nurse are not in the office this week, but will address next week upon their return.  Aware nurse will follow up to let her know whether [facility] or PCP is going to follow BP. Patient verbalized understanding and agreeable to plan.

## 2020-03-04 NOTE — Telephone Encounter (Signed)
Pt c/o BP issue: STAT if pt c/o blurred vision, one-sided weakness or slurred speech  1. What are your last 5 BP readings? Sunday(02-28-20)  106/56  At &;:5 A.m. 8:30 A.M. 134/73 9:30A.M. 134/70 11:40 A.M. 128/74  2. Are you having any other symptoms (ex. Dizziness, headache, blurred vision, passed out)? Felt like she was going to pass out- this is the frrst time she had this episode since 01-2019  3. What is your BP issue? Blood Pressure was running low Sunday and Dr Dwyane Dee told her to contact [name]  And let him know what is going on

## 2020-03-07 DIAGNOSIS — E1065 Type 1 diabetes mellitus with hyperglycemia: Secondary | ICD-10-CM | POA: Diagnosis not present

## 2020-03-08 NOTE — Telephone Encounter (Signed)
appt made with APP to discuss bp issues.

## 2020-03-16 ENCOUNTER — Encounter: Payer: Self-pay | Admitting: Student

## 2020-03-16 ENCOUNTER — Other Ambulatory Visit: Payer: Self-pay

## 2020-03-16 ENCOUNTER — Telehealth: Payer: Self-pay

## 2020-03-16 ENCOUNTER — Ambulatory Visit (INDEPENDENT_AMBULATORY_CARE_PROVIDER_SITE_OTHER): Payer: PPO | Admitting: Student

## 2020-03-16 VITALS — BP 142/70 | HR 65 | Ht 64.0 in | Wt 157.4 lb

## 2020-03-16 DIAGNOSIS — I119 Hypertensive heart disease without heart failure: Secondary | ICD-10-CM | POA: Diagnosis not present

## 2020-03-16 DIAGNOSIS — I4819 Other persistent atrial fibrillation: Secondary | ICD-10-CM

## 2020-03-16 DIAGNOSIS — I10 Essential (primary) hypertension: Secondary | ICD-10-CM

## 2020-03-16 DIAGNOSIS — Z7901 Long term (current) use of anticoagulants: Secondary | ICD-10-CM

## 2020-03-16 DIAGNOSIS — I4891 Unspecified atrial fibrillation: Secondary | ICD-10-CM

## 2020-03-16 NOTE — Progress Notes (Signed)
PCP:  Susy Frizzle, MD Primary Cardiologist: No primary care provider on file. Electrophysiologist: Thompson Grayer, MD   Gina [name] is a 74 y.o. female seen today for Thompson Grayer, MD for acute visit due to labile BP.  Since last being seen in our clinic the patient reports doing OK overall, apart from a very distressing episode 5/16. She had gotten up that am to prepare for church (Her pastors final sermon prior as Scientist, water quality) and a close friend and neighbors funeral later that day. She had not taken her medications yet. She stood up off the couch, walked about 25-30 steps and began to feel significant lightheadedness. She denies tachypalpitations or symptoms leading up to the event. She checked her BP and it was ~100/50. She propped her feet up and gradually felt better over the next 30 minutes. HRs 50-60s then, and at baseline. She rested that day. She has had these episodes intermittently going back as far as 2016, but most recently was 01/2019. She drinks about 1/2 gallon a water a day and is adamant that she would not have been dehydrated. Otherwise her pressures have been 448-185 systolic. She denies chest pain, palpitations, dyspnea, PND, orthopnea, nausea, vomiting, , syncope, edema, weight gain, or early satiety.  Past Medical History:  Diagnosis Date   Anxiety    new dx   Atrial fibrillation ([place])    a. 03/2015 s/p RFCA;  b. CHA2DS2VASc = 5-->Eliquis.   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 03/3148 - due to AF RVR;  b. 03/2015 Echo: EF 60-65%, no rwma.   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: 1/1 Node Positive for Mets.: ER/PR POs., Her 2 Neu negative, Ki-67 12%   COLONOSCOPY     1 polyp    ELECTROPHYSIOLOGIC STUDY N/A 04/08/2015   PVI + CTI ablation + posterior Box lesion + Atach ablation by [name]   OVARIAN CYST SURGERY     TEE WITHOUT CARDIOVERSION N/A 04/07/2015   Procedure: TRANSESOPHAGEAL ECHOCARDIOGRAM (TEE);  Surgeon: Thayer Headings, MD;  Location: [facility] ENDOSCOPY;  Service: Cardiovascular;  Laterality: N/A;   TONSILLECTOMY      Current Outpatient Medications  Medication Sig Dispense Refill   Ascorbic Acid (VITAMIN C) 1000 MG tablet Take 1,000 mg by mouth daily.      ASHWAGANDHA PO Take 1 tablet by mouth daily.     Cholecalciferol (VITAMIN D PO) Take 5,000 mcg by mouth daily.     Cod Liver Oil 1000 MG CAPS Take 2,000 mg by mouth daily.      Coenzyme Q10 (COQ10) 200 MG CAPS Take 200 mg by mouth daily.     Continuous Blood Gluc Sensor (DEXCOM G6 SENSOR) MISC by Does not apply route.  Continuous Blood Gluc Transmit (DEXCOM G6 TRANSMITTER) MISC by Does not apply route.     Cyanocobalamin (VITAMIN B-12) 5000 MCG SUBL Place 5,000 mcg under the tongue daily.     ELIQUIS 5 MG TABS tablet TAKE 1 TABLET BY MOUTH TWICE A DAY 60 tablet 5   glucose blood (ONE TOUCH ULTRA TEST) test strip Use asto check blood sugar 5 times a day 200 each 5   insulin aspart (NOVOLOG) 100 UNIT/ML injection USE MAX  OF 60 UNITS DAILY VIA INSULIN PUMP. 180 mL 1   losartan (COZAAR) 25 MG tablet Take 25 mg by mouth daily.     NON FORMULARY Systemic Formulas Pancreas supplement, 1 per day "                    "           Metabo-Shake (Glycemic Support) 1 scoop per day Reishi w/ Curcumin 1 per day      NON FORMULARY Systemic Formulas Glycemic Balance, 2 per day "                  "             Eyes supplement, 2 per day "                  "              ROX (Antioxidant w/ Reservatrol), 1 per day     NON FORMULARY Take 15 mLs daily by mouth. MAG CAL VANILLA LIQUID     NON FORMULARY Take 1 tablet 2 (two) times daily by mouth. BioFunction "I"     NON FORMULARY Take 1 tablet daily by mouth. Reishi w/ Curcuma     NON FORMULARY 2 capsules daily. astachol ox     Omega-3 Fatty Acids (SUPER OMEGA-3 PO) Take by mouth. Takes 2 capsules per day     RESVERATROL PO Take 250 mg by mouth daily.      Turmeric Curcumin 500 MG CAPS Take 1 tablet by mouth daily.      No current facility-administered medications for this visit.   Facility-Administered Medications Ordered in Other Visits  Medication Dose Route Frequency Provider Last Rate Last Admin   topical emolient (BIAFINE) emulsion   Topical Daily Eppie Gibson, MD   Given at 05/28/12 1400    Allergies  Allergen Reactions   Rosuvastatin Calcium     Headaches, fatigue    Social History   Socioeconomic History   Marital status: Married    Spouse name: Not on file   Number of children: Not on file   Years of education: Not on file   Highest education level: Not on file  Occupational History   Not on file  Tobacco Use   Smoking status: Never Smoker   Smokeless tobacco: Never Used  Substance and Sexual Activity   Alcohol use: No   Drug use: No   Sexual activity: Yes  Other Topics Concern   Not on file  Social History Narrative   Not on file   Social Determinants of Health   Financial Resource Strain:    Difficulty of Paying Living Expenses:   Food Insecurity:    Worried About [place] in the Last Year:    Arboriculturist in the Last Year:   Transportation Needs:    Lack of Transportation (Medical):    Lack of Transportation (Non-Medical):   Physical Activity:  Days of Exercise per  Week:    Minutes of Exercise per Session:   Stress:    Feeling of Stress :   Social Connections:    Frequency of Communication with Friends and Family:    Frequency of Social Gatherings with Friends and Family:    Attends Religious Services:    Active Member of Clubs or Organizations:    Attends Music therapist:    Marital Status:   Intimate Partner Violence:    Fear of Current or Ex-Partner:    Emotionally Abused:    Physically Abused:    Sexually Abused:      Review of Systems: General: No chills, fever, night sweats or weight changes  Cardiovascular:  No chest pain, dyspnea on exertion, edema, orthopnea, palpitations, paroxysmal nocturnal dyspnea Dermatological: No rash, lesions or masses Respiratory: No cough, dyspnea Urologic: No hematuria, dysuria Abdominal: No nausea, vomiting, diarrhea, bright red blood per rectum, melena, or hematemesis Neurologic: No visual changes, weakness, changes in mental status All other systems reviewed and are otherwise negative except as noted above.  Physical Exam: Vitals:   03/16/20 0927  BP: (!) 142/70  Pulse: 65  SpO2: 97%  Weight: 157 lb 6.4 oz (71.4 kg)  Height: 5' 4" (1.626 m)    GEN- The patient is well appearing, alert and oriented x 3 today.   HEENT: normocephalic, atraumatic; sclera clear, conjunctiva pink; hearing intact; oropharynx clear; neck supple, no JVP Lymph- no cervical lymphadenopathy Lungs- Clear to ausculation bilaterally, normal work of breathing.  No wheezes, rales, rhonchi Heart- Regular rate and rhythm, no murmurs, rubs or gallops, PMI not laterally displaced GI- soft, non-tender, non-distended, bowel sounds present, no hepatosplenomegaly Extremities- no clubbing, cyanosis, or edema; DP/PT/radial pulses 2+ bilaterally MS- no significant deformity or atrophy Skin- warm and dry, no rash or lesion Psych- euthymic mood, full affect Neuro- strength and sensation are intact  EKG is  ordered. Personal review of EKG from today shows NSR at 65 bpm, PR interval 158 ms, QRS 78 ms  Additional studies reviewed include: Previous EP office notes, PCP notes, most recent labwork  Assessment and Plan:  1. Persistent atrial fibrillation  No clinical recurrence. During her episode her HRs were stable in the 50-60s Continue Eliquis for CHA2DS2VASC of at least 5   Normal EF in 2016 with at least mild LVH. We will update this for completeness.  2. HTN She had an episode of lightheadedness on 5/16 as above.  Unclear etiology, and BP was 517 systolic at the time.   Will move losartan to bedtime, but otherwise will continue watchful waiting for now Labwork immediately after the event was unremarkable for potential cause.    3. DMII She has an insulin pump. Sugars were also normal at the time of her episode.   She has had episodes of sudden lightheadedness in the past. She has had mild occasional lightheadedness since, but nothing of the same magnitude. Will continue watchful waiting and hydration for now. She has an apple watch. I walked her through taking an EKG on her watch, and she will also do this if she has further symptoms.   RTC 6 months if no further symptoms.   Shirley Friar, PA-C  03/16/20 1:49 PM

## 2020-03-16 NOTE — Telephone Encounter (Signed)
-----   Message from Shirley Friar, PA-C sent at 03/16/2020  1:59 PM EDT ----- Can we also repeat an Echo for this lady for persistent atrial fibrillation?  It's really the only other thing that could make her near-syncope picture more clear at this point, and would be very re-assuring if normal. Thank you!Legrand Como [address] [place], PA-C 03/16/2020 1:59 PM

## 2020-03-16 NOTE — Patient Instructions (Addendum)
Medication Instructions:  TAKE LOSARTAN 25 mg AT BEDTIME... *If you need a refill on your cardiac medications before your next appointment, please call your pharmacy*   Lab Work: none If you have labs (blood work) drawn today and your tests are completely normal, you will receive your results only by: Marland Kitchen MyChart Message (if you have MyChart) OR . A paper copy in the mail If you have any lab test that is abnormal or we need to change your treatment, we will call you to review the results.   Testing/Procedures: none   Follow-Up: At [facility], you and your health needs are our priority.  As part of our continuing mission to provide you with exceptional heart care, we have created designated Provider Care Teams.  These Care Teams include your primary Cardiologist (physician) and Advanced Practice Providers (APPs -  Physician Assistants and Nurse Practitioners) who all work together to provide you with the care you need, when you need it.   Your next appointment:   6 month(s)  The format for your next appointment:   Either In Person or Virtual  Provider:   [name]   Other Instructions

## 2020-03-16 NOTE — Telephone Encounter (Signed)
I spoke to the patient and informed her that Gina [name] would like her to have an Echo for persistent Afib.  She verbalized understanding and will await our call.

## 2020-03-29 ENCOUNTER — Other Ambulatory Visit: Payer: Self-pay

## 2020-03-29 ENCOUNTER — Other Ambulatory Visit (INDEPENDENT_AMBULATORY_CARE_PROVIDER_SITE_OTHER): Payer: PPO

## 2020-03-29 DIAGNOSIS — E871 Hypo-osmolality and hyponatremia: Secondary | ICD-10-CM | POA: Diagnosis not present

## 2020-03-29 LAB — BASIC METABOLIC PANEL
BUN: 12 mg/dL (ref 6–23)
CO2: 29 mEq/L (ref 19–32)
Calcium: 9.3 mg/dL (ref 8.4–10.5)
Chloride: 96 mEq/L (ref 96–112)
Creatinine, Ser: 0.76 mg/dL (ref 0.40–1.20)
GFR: 74.04 mL/min (ref >60.00)
Glucose, Bld: 166 mg/dL — ABNORMAL HIGH (ref 70–99)
Potassium: 4.4 mEq/L (ref 3.5–5.1)
Sodium: 129 mEq/L — ABNORMAL LOW (ref 135–145)

## 2020-03-31 DIAGNOSIS — E1065 Type 1 diabetes mellitus with hyperglycemia: Secondary | ICD-10-CM | POA: Diagnosis not present

## 2020-04-04 DIAGNOSIS — E1065 Type 1 diabetes mellitus with hyperglycemia: Secondary | ICD-10-CM | POA: Diagnosis not present

## 2020-04-06 ENCOUNTER — Other Ambulatory Visit (HOSPITAL_COMMUNITY): Payer: PPO

## 2020-04-06 DIAGNOSIS — Z20822 Contact with and (suspected) exposure to covid-19: Secondary | ICD-10-CM | POA: Diagnosis not present

## 2020-04-21 ENCOUNTER — Other Ambulatory Visit: Payer: Self-pay | Admitting: Internal Medicine

## 2020-04-21 NOTE — Telephone Encounter (Signed)
Eliquis 5mg  refill request received. Patient is 74 years old, weight-71.4kg, Crea-0.76 on 03/29/2020, Diagnosis-Afib, and last seen by Oda Kilts on 03/16/2020. Dose is appropriate based on dosing criteria. Will send in refill to requested pharmacy.

## 2020-04-25 ENCOUNTER — Ambulatory Visit (HOSPITAL_COMMUNITY): Payer: PPO | Attending: Cardiology

## 2020-04-25 ENCOUNTER — Other Ambulatory Visit: Payer: Self-pay

## 2020-04-25 ENCOUNTER — Telehealth: Payer: Self-pay

## 2020-04-25 DIAGNOSIS — I4819 Other persistent atrial fibrillation: Secondary | ICD-10-CM

## 2020-04-25 DIAGNOSIS — I4891 Unspecified atrial fibrillation: Secondary | ICD-10-CM | POA: Diagnosis not present

## 2020-04-25 DIAGNOSIS — I10 Essential (primary) hypertension: Secondary | ICD-10-CM | POA: Diagnosis not present

## 2020-04-25 MED ORDER — LOSARTAN POTASSIUM 25 MG PO TABS: 25.0000 mg | ORAL_TABLET | Freq: Every day | ORAL | 3 refills | Status: DC

## 2020-04-25 NOTE — Telephone Encounter (Signed)
Pt needs help with affording Eliquis.  Pt given number to call [phone]  Advised she would receive a call from [place] to assist  Will give Pt some samples when they arrive.

## 2020-04-25 NOTE — Progress Notes (Signed)
Order placed for losartan per Pt request.

## 2020-04-26 ENCOUNTER — Telehealth: Payer: Self-pay

## 2020-04-26 NOTE — Telephone Encounter (Signed)
-----   Message from Shirley Friar, PA-C sent at 04/26/2020  8:36 AM EDT ----- Please let her know her heart function is normal!    Thank you! Legrand Como [address]" [place], [place]  04/26/2020 8:36 AM

## 2020-04-26 NOTE — Telephone Encounter (Signed)
Pt is aware and agreeable to echo results

## 2020-04-26 NOTE — Telephone Encounter (Signed)
**Note De-Identified Caira Poche Obfuscation** The pt states that her Eliquis has gone from $123.46 for a 90 day supply to $513.30 for a 30 day supply. She is aware that she is in the "donut hole" and wants to apply for pt asst through [employer].  I have advised her to give them a call at [phone] to discuss her eligibility and what is required when applying for asst with them. I did advise her to ask [employer] to mail her an application and that once she receives it to complete her part, obtain required documents per [employer] Pt Asst Program, bring all to Dr Olin Pia office to drop off and that we will take care of the provider part of the application and will fax all too [employer] Pt Asst.  She states she is calling them when we end our call. She is aware that we are leaving her 2 boxes of Eliquis 5mg  samples in the front office for her to pick up.  She verbalized understanding and thanked me for calling her to discuss pt asst for her Eliquis and for the samples. Marland Kitchen

## 2020-05-02 DIAGNOSIS — E1065 Type 1 diabetes mellitus with hyperglycemia: Secondary | ICD-10-CM | POA: Diagnosis not present

## 2020-05-04 DIAGNOSIS — E1065 Type 1 diabetes mellitus with hyperglycemia: Secondary | ICD-10-CM | POA: Diagnosis not present

## 2020-05-05 NOTE — Telephone Encounter (Signed)
**Note De-Identified Biran Mayberry Obfuscation** I called the pt and assisted her in filling out her [employer] Pt Asst application over the phone.  She is almost out of Eliquis so I advised her that we will leave her 2 boxes of Eliquis 5 mg samples for her to pick up when she drops her application off at the office.  She expressed much appreciation for our help.

## 2020-05-05 NOTE — Telephone Encounter (Signed)
Will do, thanks

## 2020-05-05 NOTE — Telephone Encounter (Signed)
Patient states she received the forms for the [facility] and would like a call back.

## 2020-05-06 ENCOUNTER — Telehealth: Payer: Self-pay

## 2020-05-06 NOTE — Telephone Encounter (Signed)
**Note De-Identified Mohamed Portlock Obfuscation** The pt left her completed [place] Pt Asst application for Eliquis at the office along with needed documents. I have completed the provider part of the application and emailed all to Dr Jackalyn Lombard nurse so she can obtain his signature, date it and to fax all to [employer] at fax number written on cover letter included.

## 2020-05-19 ENCOUNTER — Telehealth: Payer: Self-pay

## 2020-05-19 NOTE — Telephone Encounter (Signed)
FAXED [place]: [place]: Medical records request Other records requested: Last office note  All above requested information has been faxed successfully to [employer] listed above. Documents and fax confirmation have been placed in the faxed file for future reference.

## 2020-05-19 NOTE — Telephone Encounter (Signed)
error 

## 2020-05-24 NOTE — Telephone Encounter (Signed)
**Note De-Identified Gina [name] Obfuscation** We received a letter from [place] stating that the pt did not write date on the signature page of her application forasst with Eliquis. I have wrote the date 05/16/2020 in and we have faxed it back to [place].Marland Kitchen

## 2020-06-02 DIAGNOSIS — E1065 Type 1 diabetes mellitus with hyperglycemia: Secondary | ICD-10-CM | POA: Diagnosis not present

## 2020-06-05 ENCOUNTER — Other Ambulatory Visit: Payer: Self-pay | Admitting: Endocrinology

## 2020-06-05 DIAGNOSIS — E1065 Type 1 diabetes mellitus with hyperglycemia: Secondary | ICD-10-CM

## 2020-06-06 ENCOUNTER — Other Ambulatory Visit: Payer: Self-pay

## 2020-06-06 ENCOUNTER — Other Ambulatory Visit (INDEPENDENT_AMBULATORY_CARE_PROVIDER_SITE_OTHER): Payer: PPO

## 2020-06-06 DIAGNOSIS — E1065 Type 1 diabetes mellitus with hyperglycemia: Secondary | ICD-10-CM

## 2020-06-06 LAB — COMPREHENSIVE METABOLIC PANEL
ALT: 17 U/L (ref 0–35)
AST: 21 U/L (ref 0–37)
Albumin: 4.2 g/dL (ref 3.5–5.2)
Alkaline Phosphatase: 72 U/L (ref 39–117)
BUN: 13 mg/dL (ref 6–23)
CO2: 28 mEq/L (ref 19–32)
Calcium: 9.2 mg/dL (ref 8.4–10.5)
Chloride: 95 mEq/L — ABNORMAL LOW (ref 96–112)
Creatinine, Ser: 0.85 mg/dL (ref 0.40–1.20)
GFR: 65.04 mL/min (ref >60.00)
Glucose, Bld: 162 mg/dL — ABNORMAL HIGH (ref 70–99)
Potassium: 4.6 mEq/L (ref 3.5–5.1)
Sodium: 131 mEq/L — ABNORMAL LOW (ref 135–145)
Total Bilirubin: 0.6 mg/dL (ref 0.2–1.2)
Total Protein: 7 g/dL (ref 6.0–8.3)

## 2020-06-06 LAB — MICROALBUMIN / CREATININE URINE RATIO
Creatinine,U: 51.2 mg/dL
Microalb Creat Ratio: 1.4 mg/g (ref 0.0–30.0)
Microalb, Ur: <0.7 mg/dL (ref 0.0–1.9)

## 2020-06-06 LAB — URINALYSIS, ROUTINE W REFLEX MICROSCOPIC
Bilirubin Urine: NEGATIVE
Hgb urine dipstick: NEGATIVE
Ketones, ur: NEGATIVE
Leukocytes,Ua: NEGATIVE
Nitrite: NEGATIVE
RBC / HPF: NONE SEEN (ref 0–?)
Specific Gravity, Urine: 1.015 (ref 1.000–1.030)
Total Protein, Urine: NEGATIVE
Urine Glucose: NEGATIVE
Urobilinogen, UA: 0.2 (ref 0.0–1.0)
WBC, UA: NONE SEEN (ref 0–?)
pH: 7.5 (ref 5.0–8.0)

## 2020-06-06 LAB — HEMOGLOBIN A1C: Hgb A1c MFr Bld: 7.2 % — ABNORMAL HIGH (ref 4.6–6.5)

## 2020-06-07 DIAGNOSIS — E1065 Type 1 diabetes mellitus with hyperglycemia: Secondary | ICD-10-CM | POA: Diagnosis not present

## 2020-06-09 ENCOUNTER — Other Ambulatory Visit: Payer: Self-pay

## 2020-06-09 ENCOUNTER — Encounter: Payer: Self-pay | Admitting: Endocrinology

## 2020-06-09 ENCOUNTER — Ambulatory Visit (INDEPENDENT_AMBULATORY_CARE_PROVIDER_SITE_OTHER): Payer: PPO | Admitting: Endocrinology

## 2020-06-09 VITALS — BP 140/72 | HR 63 | Ht 64.0 in | Wt 155.2 lb

## 2020-06-09 DIAGNOSIS — E038 Other specified hypothyroidism: Secondary | ICD-10-CM

## 2020-06-09 DIAGNOSIS — E871 Hypo-osmolality and hyponatremia: Secondary | ICD-10-CM | POA: Diagnosis not present

## 2020-06-09 DIAGNOSIS — E1065 Type 1 diabetes mellitus with hyperglycemia: Secondary | ICD-10-CM | POA: Diagnosis not present

## 2020-06-09 DIAGNOSIS — E7800 Pure hypercholesterolemia, unspecified: Secondary | ICD-10-CM

## 2020-06-09 NOTE — Progress Notes (Signed)
Patient ID: Gina [name], female   DOB: 05/20/1946, 74 y.o.   MRN: [id number]      Reason for Appointment: Insulin Pump followup:   History of Present Illness   Diagnosis: Type 1 DIABETES MELITUS, date of diagnosis:  1984      HISTORY:   T-Slim PUMP SETTINGS are:  12 AM-2 AM = 0.3, 2 AM-4 AM = 0.45, 4 AM-8 AM = 1.10, 8 AM-11:30 AM = 0.9, 11:30 AM = 1.0; 2 PM = 0. 90, 6 PM = 0.7,10 PM-12 AM = 0.4  Total basal insulin 19 units  Carbohydrate ratio 1: 8 at all meals Blood sugar target 100.  Correction factor 1: 50 Active insulin 4 hours   Current management, blood sugar patterns and problems identified:  A1c is 7.2 and stable   She has difficulties with sometimes her sensor not lasting for 10 days and has not been able to get any readings from the [place]  Overall blood sugars are excellent and still averaging about the same at 145  Blood sugar patterns are discussed below  She says that because of eating fruits like watermelon sometimes will have high readings after lunch and only occasionally after dinner  As before may occasionally be missing her boluses at mealtimes with relatively lower carbohydrate or smaller meals  She is mostly active with doing outside work and some walking but this does not cause any low sugars  Recently not appearing to be using the sleep more at night except occasionally  Also has not apparently use the exercise modality when more active  However has been able to avoid hypoglycemia with the control IQ modality   CONTINUOUS GLUCOSE MONITORING RECORD INTERPRETATION    Dates of Recording: Last 2 weeks  Sensor description: Dexcom  Results statistics:  GMI 6.7   CGM use % of time  99  Average and SD  145+/-  Time in range       84%  % Time Above 180  16  % Time above 250   % Time Below target 0    PRE-MEAL Fasting Lunch Dinner Bedtime Overall  Glucose range:       Mean/median:  121  123  132     POST-MEAL  PC Breakfast PC Lunch PC Dinner  Glucose range:     Mean/median:  146  166  152    Glycemic patterns summary: Blood sugars are generally very stable with highest readings after lunch and lowest reading on waking up Has more variability after lunch and dinner and sometimes early morning  Hyperglycemic episodes are occurring only occasionally early morning, after lunch and sometimes after dinner  Hypoglycemic episodes were minimal  Overnight periods: Overall blood sugars are very stable although may occasionally be higher early morning around 4-5 AM  Preprandial periods: although she has some variability after mealtimes her average blood sugars are reasonably good without hypoglycemia  Postprandial periods:   After breakfast:   Usually since she does not have much carbohydrate intake and boluses blood sugars are relatively flat After lunch:   In the last week blood sugars have been only rising modestly but the first week of her data blood sugars were significantly higher at times, highest about 275 After dinner: She has some relative spikes in her blood sugars preceded by low normal readings but generally not rising above 180 target  Previous data:   CGM use % of time  98  Average and SD  144+/-34  Time  in range       36%  % Time Above 180  14  % Time above 250   % Time Below target     PRE-MEAL  night  6 AM-12 PM  afternoon  6 PM + Overall  Glucose range:       Mean/median:  138, was 132  149, was 139  151, was 156  140, was 151    Glycemic patterns summary: Overall blood sugar fluctuation is low but more so in the early mornings and late afternoons. 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 Color, Urine 06/06/2020 YELLOW  Yellow;Lt. Yellow;Straw;Dark Yellow;Amber;Green;Red;Brown Final  . APPearance 06/06/2020 CLEAR  Clear;Turbid;Slightly Cloudy;Cloudy Final  . Specific Gravity, Urine 06/06/2020 1.015  1.000 - 1.030 Final  . pH 06/06/2020 7.5  5.0 - 8.0 Final  . Total Protein, Urine 06/06/2020 NEGATIVE  Negative Final  . Urine Glucose 06/06/2020 NEGATIVE  Negative Final  . Ketones, ur 06/06/2020 NEGATIVE  Negative Final  . Bilirubin Urine 06/06/2020 NEGATIVE  Negative Final  . Hgb urine dipstick 06/06/2020 NEGATIVE  Negative Final  . Urobilinogen, UA 06/06/2020 0.2  0.0 - 1.0 Final  . Leukocytes,Ua 06/06/2020 NEGATIVE  Negative Final  . Nitrite 06/06/2020 NEGATIVE  Negative Final  . WBC, UA 06/06/2020 none seen  0-2/hpf Final  . RBC / HPF 06/06/2020 none seen  0-2/hpf Final  . Microalb, Ur 06/06/2020 <0.7  0.0 - 1.9 mg/dL Final  . Creatinine,U 06/06/2020 51.2  mg/dL Final  . Microalb Creat Ratio 06/06/2020 1.4  0.0 - 30.0 mg/g Final  . Sodium 06/06/2020 131* 135 - 145 mEq/L Final  . Potassium 06/06/2020 4.6  3.5 - 5.1 mEq/L Final  . Chloride 06/06/2020 95* 96 - 112 mEq/L Final  . CO2 06/06/2020 28  19 - 32 mEq/L Final  . Glucose, Bld 06/06/2020 162* 70 - 99 mg/dL Final  . BUN 06/06/2020 13  6 - 23 mg/dL Final  . Creatinine, Ser 06/06/2020 0.85  0.40 - 1.20 mg/dL Final  . Total Bilirubin 06/06/2020 0.6  0.2 - 1.2 mg/dL Final  . Alkaline Phosphatase 06/06/2020 72  39 - 117 U/L Final  . AST 06/06/2020 21  0 - 37 U/L Final  . ALT 06/06/2020 17  0 - 35 U/L  Final  . Total Protein 06/06/2020 7.0  6.0 - 8.3 g/dL Final  . Albumin 06/06/2020 4.2  3.5 - 5.2 g/dL Final  . GFR 06/06/2020 65.04  >60.00 mL/min Final  . Calcium 06/06/2020 9.2  8.4 - 10.5 mg/dL Final  . Hgb A1c MFr Bld 06/06/2020 7.2* 4.6 - 6.5 % Final   Glycemic Control Guidelines for People with Diabetes:Non  Diabetic:  <6%Goal of Therapy: <7%Additional Action Suggested:  >8%      Allergies as of 06/09/2020      Reactions   Rosuvastatin Calcium    Headaches, fatigue      Medication List       Accurate as of June 09, 2020 10:37 AM. If you have any questions, ask your nurse or doctor.        ASHWAGANDHA PO Take 1 tablet by mouth daily.   Cod Liver Oil 1000 MG Caps Take 2,000 mg by mouth daily.   CoQ10 200 MG Caps Take 200 mg by mouth daily.   Dexcom G6 Sensor Misc by Does not apply route.   Dexcom G6 Transmitter Misc by Does not apply route.   Eliquis 5 MG Tabs tablet Generic drug: apixaban TAKE 1 TABLET BY MOUTH TWICE A DAY   glucose blood test strip Commonly known as: ONE TOUCH ULTRA TEST Use asto check blood sugar 5 times a day   insulin aspart 100 UNIT/ML injection Commonly known as: NovoLOG USE MAX  OF 60 UNITS DAILY VIA INSULIN PUMP.   losartan 25 MG tablet Commonly known as: COZAAR Take 1 tablet (25 mg total) by mouth daily.   NON FORMULARY Systemic Formulas Pancreas supplement, 1 per day "                    "           Metabo-Shake (Glycemic Support) 1 scoop per day Reishi w/ Curcumin 1 per day   NON FORMULARY Systemic Formulas Glycemic Balance, 2 per day "                  "             Eyes supplement, 2 per day "                  "              ROX (Antioxidant w/ Reservatrol), 1 per day   NON FORMULARY Take 15 mLs daily by mouth. MAG CAL VANILLA LIQUID   NON FORMULARY Take 1 tablet 2 (two) times daily by mouth. BioFunction "I"   NON FORMULARY Take 1 tablet daily by mouth. Reishi w/ Curcuma   NON FORMULARY 2 capsules daily.  astachol ox   RESVERATROL PO Take 250 mg by mouth daily.   SUPER OMEGA-3 PO Take by mouth. Takes 2 capsules per day   Turmeric Curcumin 500 MG Caps Take 1 tablet by mouth daily.   Vitamin B-12 5000 MCG Subl Place 5,000 mcg under the tongue daily.   vitamin C 1000 MG tablet Take 1,000 mg by mouth daily.   VITAMIN D PO Take 5,000 mcg by mouth daily.       Allergies:  Allergies  Allergen Reactions  . Rosuvastatin Calcium     Headaches, fatigue    Past Medical History:  Diagnosis Date  . Anxiety    new dx  . Atrial fibrillation ([place])    a. 03/2015 s/p RFCA;  b. CHA2DS2VASc = 5-->Eliquis.  . 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 Diabetes mellitus type 1 (HCC)    Insulin pump  . Diastolic CHF ([place])    a. 10/6107 - due to AF RVR;  b. 03/2015 Echo: EF 60-65%, no rwma.  . Hyperlipidemia   . Hypertension   . Hypothyroidism   .  Insulin pump in place   . Ovarian cyst   . S/P radiation therapy 04/10/12- 05/26/2012   left Breast and Axilla / 50 gy / 25 Fractions with Left Breast Boost / 10 Gy / 5 Fractions  . SIADH (syndrome of inappropriate ADH production) ([place])   . Use of letrozole ([place]) 06/13/12    Past Surgical History:  Procedure Laterality Date  . APPENDECTOMY    . 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 COLONOSCOPY     1 polyp   . ELECTROPHYSIOLOGIC STUDY N/A 04/08/2015   PVI + CTI ablation + posterior Box lesion + Atach ablation by [name]  . OVARIAN CYST SURGERY    . TEE WITHOUT CARDIOVERSION N/A 04/07/2015   Procedure: TRANSESOPHAGEAL ECHOCARDIOGRAM (TEE);  Surgeon: Thayer Headings, MD;  Location: [facility] ENDOSCOPY;  Service: Cardiovascular;  Laterality: N/A;  . TONSILLECTOMY      Family History   Problem Relation Age of Onset  . Breast cancer Mother 38  . Heart failure Mother   . Prostate cancer Maternal Uncle        died in his 45s  . Heart attack Father   . Diabetes Maternal Uncle 15    Social History:  reports that she has never smoked. She has never used smokeless tobacco. She reports that she does not drink alcohol and does not use drugs.  REVIEW of systems:     She has a history of mild hypothyroidism followed by a holistic physician  She states that she has not taken Armour Thyroid and is apparently applying a cream on her wrists containing custom formulated thyroid hormone from the holistic physician but has not had any follow-up labs after this Does not think she feels fatigued with the change  Lab Results  Component Value Date   TSH 3.50 02/29/2020   TSH 2.68 11/26/2019   TSH 1.46 06/01/2019   FREET4 1.05 02/29/2020   FREET4 1.06 11/26/2019   FREET4 1.05 03/02/2019   Lab Results  Component Value Date   T3FREE 2.4 02/29/2020   T3FREE 2.7 11/26/2019     HYPERCHOLESTEROLEMIA: Her LDL has been persistently high and refuses to take a statin drug including pravastatin even though it had no side effects previously.  She was previously trying to use red rice yeast , none now Previously her LDL particle number is consistently high at 1621  She was given a prescription for Crestor which she tried for 3-4 weeks but did not continue because she felt it was giving her headaches and making her tired  She prefers to use natural substances and essential oils for her hypercholesterolemia  Her LDL levels as below   Lab Results  Component Value Date   CHOL 232 (H) 02/29/2020   CHOL 223 (H) 06/01/2019   CHOL 240 (H) 03/02/2019   Lab Results  Component Value Date   HDL 66.30 02/29/2020   HDL 64.80 06/01/2019   HDL 72.10 03/02/2019   Lab Results  Component Value Date   LDLCALC 150 (H) 02/29/2020   LDLCALC 145 (H) 06/01/2019   LDLCALC 156 (H) 03/02/2019    Lab Results  Component Value Date   TRIG 78.0 02/29/2020   TRIG 64.0 06/01/2019   TRIG 59.0 03/02/2019   Lab Results  Component Value Date   CHOLHDL 4 02/29/2020   CHOLHDL 3 06/01/2019   CHOLHDL 3  03/02/2019   Lab Results  Component Value Date   LDLDIRECT 148.4 08/06/2013   . She takes several herbal supplements as before  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 been longstanding, not on any diuretics or SSRIs  Urine osmolality as of 04/2015 was 618 She was told to cut back on her fluid intake and what she is doing a little better Sodium is improving at 131, has been as low as 126 this year She still does not understand the need for fluid restriction  Lab Results  Component Value Date   NA 131 (L) 06/06/2020   K 4.6 06/06/2020   CL 95 (L) 06/06/2020   CO2 28 06/06/2020   She is still reluctant to take the second Covid vaccine despite explaining the need for protection against the delta variant   EXAM:  BP 140/72 (BP Location: Right Arm, Patient Position: Sitting, Cuff Size: Large)   Pulse 63   Ht _0  (1.626 m)   Wt 155 lb 3.2 oz (70.4 kg)   SpO2 96%   BMI 26.64 kg/m     ASSESSMENT:  DIABETES on T-Slim pump  See history of present illness for detailed discussion of current diabetes management, blood sugar patterns and problems identified  Her A1c is 7.2 and stable  She is managing well with a T-Slim insulin pump in the control IQ modality Within target range 84% of the time Has as before postprandial hyperglycemia either from excessive carbohydrates like fruits or inadequate or missed boluses Currently only entering an average of 28 g of carbohydrates per day and likely eating more than this However her control is doing better in the last week compared to the previous  Considering her age and duration of diabetes and current A1c she is doing very well with no hypoglycemia No significant complications, microalbumin is normal  RECOMMENDATIONS:  Will not change her  basal rates as yet Advised her to bolus more consistently for all meals and carbohydrates especially high glycemic index foods like fruits She will discuss difficulties with her Dexcom sensor with nurse educator Use exercise more when doing more strenuous activities   Asymptomatic hyponatremia: Previously diagnosed as idiopathic SIADH Currently not symptomatic again and her sodium has improved with encouragement to restrict fluid Again explained the need for doing this and mechanism of SIADH   Hyperlipidemia: Previously not controlled and she has refused any treatment  Episode of near syncope and bradycardia: Encouraged her to call her cardiologist and have a follow-up, likely needs Holter monitor  Hypothyroidism:  not clear if she has primary or secondary hypothyroidism or whether she truly has hypothyroidism She reportedly is using a transdermal cream for thyroid supplements from holistic physician We will check her thyroid levels on the next labs  Strongly encouraged her to consider doing her second Covid vaccine  Follow-up in 4 months  Chance Munter 06/09/2020, 10:37 AM

## 2020-07-04 DIAGNOSIS — E1065 Type 1 diabetes mellitus with hyperglycemia: Secondary | ICD-10-CM | POA: Diagnosis not present

## 2020-08-03 DIAGNOSIS — E1065 Type 1 diabetes mellitus with hyperglycemia: Secondary | ICD-10-CM | POA: Diagnosis not present

## 2020-09-02 DIAGNOSIS — E1065 Type 1 diabetes mellitus with hyperglycemia: Secondary | ICD-10-CM | POA: Diagnosis not present

## 2020-09-07 DIAGNOSIS — E1065 Type 1 diabetes mellitus with hyperglycemia: Secondary | ICD-10-CM | POA: Diagnosis not present

## 2020-09-26 ENCOUNTER — Other Ambulatory Visit: Payer: Self-pay

## 2020-09-26 ENCOUNTER — Ambulatory Visit: Payer: PPO | Admitting: Internal Medicine

## 2020-09-26 ENCOUNTER — Encounter: Payer: Self-pay | Admitting: Internal Medicine

## 2020-09-26 VITALS — BP 142/78 | HR 68 | Ht 64.0 in | Wt 157.2 lb

## 2020-09-26 DIAGNOSIS — I10 Essential (primary) hypertension: Secondary | ICD-10-CM

## 2020-09-26 DIAGNOSIS — I4819 Other persistent atrial fibrillation: Secondary | ICD-10-CM | POA: Diagnosis not present

## 2020-09-26 NOTE — Patient Instructions (Addendum)
Medication Instructions:  Your physician recommends that you continue on your current medications as directed. Please refer to the Current Medication list given to you today.  *If you need a refill on your cardiac medications before your next appointment, please call your pharmacy*  Lab Work: None ordered.  If you have labs (blood work) drawn today and your tests are completely normal, you will receive your results only by: Marland Kitchen MyChart Message (if you have MyChart) OR . A paper copy in the mail If you have any lab test that is abnormal or we need to change your treatment, we will call you to review the results.  Testing/Procedures: None ordered.  Follow-Up: At [facility], you and your health needs are our priority.  As part of our continuing mission to provide you with exceptional heart care, we have created designated Provider Care Teams.  These Care Teams include your primary Cardiologist (physician) and Advanced Practice Providers (APPs -  Physician Assistants and Nurse Practitioners) who all work together to provide you with the care you need, when you need it.  We recommend signing up for the patient portal called "MyChart".  Sign up information is provided on this After Visit Summary.  MyChart is used to connect with patients for Virtual Visits (Telemedicine).  Patients are able to view lab/test results, encounter notes, upcoming appointments, etc.  Non-urgent messages can be sent to your provider as well.   To learn more about what you can do with MyChart, go to [email or web address].    Your next appointment:   Your physician wants you to follow-up in: 6 months with the afib clinic. They will contact you.    Other Instructions:

## 2020-09-26 NOTE — Progress Notes (Signed)
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 female who presents today for routine electrophysiology followup.  Since last being seen in our clinic, the patient reports doing very well.  Today, she denies symptoms of palpitations, chest pain, shortness of breath,  lower extremity edema, dizziness, presyncope, or syncope.  The patient is otherwise without complaint today.   Past Medical History:  Diagnosis Date  . Anxiety    new dx  . Atrial fibrillation ([place])    a. 03/2015 s/p RFCA;  b. CHA2DS2VASc = 5-->Eliquis.  . 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 Diabetes mellitus type 1 (HCC)    Insulin pump  . Diastolic CHF ([place])    a. 05/6577 - due to AF RVR;  b. 03/2015 Echo: EF 60-65%, no rwma.  . Hyperlipidemia   . Hypertension   . Hypothyroidism   . Insulin pump in place   . Ovarian cyst   . S/P radiation therapy 04/10/12- 05/26/2012   left Breast and Axilla / 50 gy / 25 Fractions with Left Breast Boost / 10 Gy / 5 Fractions  . SIADH (syndrome of inappropriate ADH production) ([place])   . Use of letrozole ([place]) 06/13/12   Past Surgical History:  Procedure Laterality Date  . APPENDECTOMY    . 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 COLONOSCOPY     1 polyp   . ELECTROPHYSIOLOGIC STUDY N/A 04/08/2015   PVI + CTI ablation + posterior Box lesion + Atach ablation by [name]  . OVARIAN CYST SURGERY    . TEE WITHOUT CARDIOVERSION N/A 04/07/2015   Procedure: TRANSESOPHAGEAL ECHOCARDIOGRAM (TEE);  Surgeon: Thayer Headings, MD;  Location: [facility] ENDOSCOPY;  Service: Cardiovascular;  Laterality: N/A;  . TONSILLECTOMY      ROS- all systems are reviewed and negatives except as per HPI above  Current  Outpatient Medications  Medication Sig Dispense Refill  . Ascorbic Acid (VITAMIN C) 1000 MG tablet Take 1,000 mg by mouth daily.     . ASHWAGANDHA PO Take 1 tablet by mouth daily.    . Cholecalciferol (VITAMIN D PO) Take 5,000 mcg by mouth daily.    Marland Kitchen Cod Liver Oil 1000 MG CAPS Take 2,000 mg by mouth daily.     . Coenzyme Q10 (COQ10) 200 MG CAPS Take 200 mg by mouth daily.    . Continuous Blood Gluc Sensor (DEXCOM G6 SENSOR) MISC by Does not apply route.    . Continuous Blood Gluc Transmit (DEXCOM G6 TRANSMITTER) MISC by Does not apply route.    . Cyanocobalamin (VITAMIN B-12) 5000 MCG SUBL Place 5,000 mcg under the tongue daily.    Marland Kitchen ELIQUIS 5 MG TABS tablet TAKE 1 TABLET BY MOUTH TWICE A DAY 60 tablet 10  . glucose blood (ONE TOUCH ULTRA TEST) test strip Use asto check blood sugar 5 times a day 200 each 5  . insulin aspart (NOVOLOG) 100 UNIT/ML injection USE MAX  OF 60 UNITS DAILY VIA INSULIN PUMP. 180 mL 1  . losartan (COZAAR) 25 MG tablet Take 1 tablet (25 mg total) by mouth daily. 90 tablet 3  . NON FORMULARY Systemic Formulas Pancreas supplement, 1 per day "                    "  Metabo-Shake (Glycemic Support) 1 scoop per day Reishi w/ Curcumin 1 per day    . NON FORMULARY Systemic Formulas Glycemic Balance, 2 per day "                  "             Eyes supplement, 2 per day "                  "              ROX (Antioxidant w/ Reservatrol), 1 per day    . NON FORMULARY Take 15 mLs by mouth daily. MAG CAL VANILLA LIQUID    . NON FORMULARY Take 1 tablet by mouth 2 (two) times daily. BioFunction "I"    . NON FORMULARY Take 1 tablet by mouth daily. Reishi w/ Curcuma    . NON FORMULARY 2 capsules daily. astachol ox    . Omega-3 Fatty Acids (SUPER OMEGA-3 PO) Take by mouth. Takes 2 capsules per day    . RESVERATROL PO Take 250 mg by mouth daily.     . Turmeric Curcumin 500 MG CAPS Take 1 tablet by mouth daily.      No current facility-administered medications for this visit.    Facility-Administered Medications Ordered in Other Visits  Medication Dose Route Frequency Provider Last Rate Last Admin  . topical emolient (BIAFINE) emulsion   Topical Daily Eppie Gibson, MD   Given at 05/28/12 1400    Physical Exam: Vitals:   09/26/20 1156  BP: (!) 142/78  Pulse: 68  SpO2: 95%  Weight: 157 lb 3.2 oz (71.3 kg)  Height: $Remove'5\' 4"'[id number]$  (1.626 m)    GEN- The patient is well appearing, alert and oriented x 3 today.   Head- normocephalic, atraumatic Eyes-  Sclera clear, conjunctiva pink Ears- hearing intact Oropharynx- clear Lungs- Clear to ausculation bilaterally, normal work of breathing Heart- Regular rate and rhythm, no murmurs, rubs or gallops, PMI not laterally displaced GI- soft, NT, ND, + BS Extremities- no clubbing, cyanosis, or edema  Wt Readings from Last 3 Encounters:  09/26/20 157 lb 3.2 oz (71.3 kg)  06/09/20 155 lb 3.2 oz (70.4 kg)  03/16/20 157 lb 6.4 oz (71.4 kg)    EKG tracing ordered today is personally reviewed and shows sinus  Assessment and Plan:  1. Persistent afib Well controlled chads2vasc score is 5.  She is on eliquis  2. HTN Stable No change required today   Risks, benefits and potential toxicities for medications prescribed and/or refilled reviewed with patient today.   Return to AF clinic every 6 months  Thompson Grayer MD, [facility] 09/26/2020 12:17 PM

## 2020-10-03 DIAGNOSIS — E1065 Type 1 diabetes mellitus with hyperglycemia: Secondary | ICD-10-CM | POA: Diagnosis not present

## 2020-10-17 ENCOUNTER — Other Ambulatory Visit: Payer: PPO

## 2020-10-18 ENCOUNTER — Other Ambulatory Visit (INDEPENDENT_AMBULATORY_CARE_PROVIDER_SITE_OTHER): Payer: PPO

## 2020-10-18 ENCOUNTER — Other Ambulatory Visit: Payer: Self-pay

## 2020-10-18 DIAGNOSIS — E871 Hypo-osmolality and hyponatremia: Secondary | ICD-10-CM | POA: Diagnosis not present

## 2020-10-18 DIAGNOSIS — E1065 Type 1 diabetes mellitus with hyperglycemia: Secondary | ICD-10-CM | POA: Diagnosis not present

## 2020-10-18 DIAGNOSIS — E038 Other specified hypothyroidism: Secondary | ICD-10-CM

## 2020-10-18 LAB — COMPREHENSIVE METABOLIC PANEL
ALT: 16 U/L (ref 0–35)
AST: 20 U/L (ref 0–37)
Albumin: 4 g/dL (ref 3.5–5.2)
Alkaline Phosphatase: 62 U/L (ref 39–117)
BUN: 14 mg/dL (ref 6–23)
CO2: 29 mEq/L (ref 19–32)
Calcium: 8.9 mg/dL (ref 8.4–10.5)
Chloride: 95 mEq/L — ABNORMAL LOW (ref 96–112)
Creatinine, Ser: 0.77 mg/dL (ref 0.40–1.20)
GFR: 74.99 mL/min (ref >60.00)
Glucose, Bld: 176 mg/dL — ABNORMAL HIGH (ref 70–99)
Potassium: 4.5 mEq/L (ref 3.5–5.1)
Sodium: 129 mEq/L — ABNORMAL LOW (ref 135–145)
Total Bilirubin: 0.5 mg/dL (ref 0.2–1.2)
Total Protein: 6.6 g/dL (ref 6.0–8.3)

## 2020-10-18 LAB — LIPID PANEL
Cholesterol: 262 mg/dL — ABNORMAL HIGH (ref 0–200)
HDL: 68.1 mg/dL (ref >39.00)
LDL Cholesterol: 181 mg/dL — ABNORMAL HIGH (ref 0–99)
NonHDL: 194.31
Total CHOL/HDL Ratio: 4
Triglycerides: 67 mg/dL (ref 0.0–149.0)
VLDL: 13.4 mg/dL (ref 0.0–40.0)

## 2020-10-18 LAB — T4, FREE: Free T4: 0.94 ng/dL (ref 0.60–1.60)

## 2020-10-18 LAB — T3, FREE: T3, Free: 2.6 pg/mL (ref 2.3–4.2)

## 2020-10-18 LAB — HEMOGLOBIN A1C: Hgb A1c MFr Bld: 7.1 % — ABNORMAL HIGH (ref 4.6–6.5)

## 2020-10-18 LAB — TSH: TSH: 4.7 u[IU]/mL — ABNORMAL HIGH (ref 0.35–4.50)

## 2020-10-20 ENCOUNTER — Ambulatory Visit (INDEPENDENT_AMBULATORY_CARE_PROVIDER_SITE_OTHER): Payer: PPO | Admitting: Endocrinology

## 2020-10-20 ENCOUNTER — Other Ambulatory Visit: Payer: Self-pay

## 2020-10-20 ENCOUNTER — Encounter: Payer: Self-pay | Admitting: Endocrinology

## 2020-10-20 VITALS — BP 134/82 | HR 71 | Ht 64.0 in | Wt 157.6 lb

## 2020-10-20 DIAGNOSIS — E1065 Type 1 diabetes mellitus with hyperglycemia: Secondary | ICD-10-CM

## 2020-10-20 DIAGNOSIS — E871 Hypo-osmolality and hyponatremia: Secondary | ICD-10-CM

## 2020-10-20 DIAGNOSIS — E7800 Pure hypercholesterolemia, unspecified: Secondary | ICD-10-CM | POA: Diagnosis not present

## 2020-10-20 DIAGNOSIS — E038 Other specified hypothyroidism: Secondary | ICD-10-CM | POA: Diagnosis not present

## 2020-10-20 MED ORDER — EZETIMIBE 10 MG PO TABS: 10.0000 mg | ORAL_TABLET | Freq: Every day | ORAL | 3 refills | Status: DC

## 2020-10-20 MED ORDER — THYROID 30 MG PO TABS: 30.0000 mg | ORAL_TABLET | Freq: Every day | ORAL | 2 refills | Status: DC

## 2020-10-20 NOTE — Progress Notes (Signed)
Patient ID: Gina [name], female   DOB: [birth year], 75 y.o.   MRN: [id number]      Reason for Appointment: Insulin Pump followup:   History of Present Illness   Diagnosis: Type 1 DIABETES MELITUS, date of diagnosis:  1984      HISTORY:   T-Slim PUMP SETTINGS are:  12 AM-2 AM = 0.3, 2 AM-4 AM = 0.45, 4 AM-8 AM = 1.10, 8 AM-11:30 AM = 0.9, 11:30 AM = 1.0; 2 PM = 0. 90, 6 PM = 0.7,10 PM-12 AM = 0.4  Total basal insulin 19 units  Carbohydrate ratio 1: 8 at all meals Blood sugar target 100.  Correction factor 1: 50 Active insulin 4 hours   Current management, blood sugar patterns and problems identified:  A1c is 7.1 and stable   She has relatively stable blood sugars  Blood sugar patterns are discussed and the CGM interpretation below  As before she may not be bolusing enough for some of her meals especially lunchtime and sometimes will have readings over 200 postprandially  Not eating any carbohydrates at breakfast and blood sugar usually does not go up after eating  Although she has not had any hypoglycemia she will suspend her pump when she is very active and her blood sugars are going down near normal; she forgets to use the exercise mode and does not appear to know how to use this  Couple of months ago she had very high blood sugars from infusion set malfunction but she did not know what to do; missing AN extra infusion set with her when she is going out  She is currently not very comfortable in making changes in her pump settings   CONTINUOUS GLUCOSE MONITORING RECORD INTERPRETATION     Glycemic patterns are as follows: Average blood sugar is within the target range throughout the day and night.  HIGHEST blood sugars on average are late afternoon and after dinner with some variability and no consistent high sugars  On an average of blood sugars are higher in the mid afternoon compared to other times  Overnight blood sugars are excellent with some  tendency to low normal readings before 8 AM  Postprandial readings may be only occasionally higher after breakfast and on average highest postprandial readings are after lunch  Premeal blood sugars are near normal at all of her meals  Hypoglycemia has not been documented with lowest blood sugar 68    CGM use % of time  98  2-week average/SD  133  Time in range  93       %  % Time Above 180  7  % Time above 250   % Time Below 70 0      PRE-MEAL  overnight  mornings  afternoon  evening Overall  Glucose range:       Averages:  129  121  143+/-30  142    PREVIOUS data:   CGM use % of time  99  Average and SD  145+/-  Time in range       84%  % Time Above 180  16  % Time above 250   % Time Below target 0    PRE-MEAL Fasting Lunch Dinner Bedtime Overall  Glucose range:       Mean/median:  121  123  132     POST-MEAL PC Breakfast PC Lunch PC Dinner  Glucose range:     Mean/median:  146  166  152  Glycemic patterns summary: Blood sugars are generally very stable with highest readings after lunch and lowest reading on waking up Has more variability after lunch and dinner and sometimes early morning   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 T3, Free 10/18/2020 2.6  2.3 - 4.2 pg/mL Final  . Free T4 10/18/2020 0.94  0.60 - 1.60 ng/dL Final   Comment: Specimens from patients who are undergoing biotin therapy and /or ingesting biotin  supplements may contain high levels of biotin.  The higher biotin concentration in these specimens interferes with this Free T4 assay.  Specimens that contain high levels  of biotin may cause false high results for this Free T4 assay.  Please interpret results in light of the total clinical presentation of the patient.    Marland Kitchen TSH 10/18/2020 4.70* 0.35 - 4.50 uIU/mL Final  . Cholesterol 10/18/2020 262* 0 - 200 mg/dL Final   ATP III Classification       Desirable:  < 200 mg/dL               Borderline High:  200 - 239 mg/dL          High:  > = 912 mg/dL  . Triglycerides 10/18/2020 67.0  0.0 - 149.0 mg/dL Final   Normal:  <443 mg/dLBorderline High:  150 - 199 mg/dL  . HDL 10/18/2020 68.10  >39.00 mg/dL Final  . VLDL 90/98/3180 13.4  0.0 - 40.0 mg/dL Final  . LDL Cholesterol 10/18/2020 181* 0 - 99 mg/dL Final  . Total CHOL/HDL Ratio 10/18/2020 4   Final                  Men          Women1/2 Average Risk     3.4          3.3Average Risk          5.0          4.42X Average Risk          9.6          7.13X Average Risk          15.0          11.0                      . NonHDL 10/18/2020 194.31   Final   NOTE:  Non-HDL goal should be 30 mg/dL higher than patient's LDL goal (i.e. LDL goal of < 70 mg/dL, would have non-HDL goal of < 100 mg/dL)  . Sodium 10/18/2020 129* 135 - 145 mEq/L Final  . Potassium 10/18/2020 4.5  3.5 - 5.1 mEq/L Final  . Chloride 10/18/2020 95* 96 - 112 mEq/L Final  . CO2 10/18/2020 29  19 - 32 mEq/L Final  . Glucose, Bld 10/18/2020 176* 70 - 99  mg/dL Final  . BUN 10/18/2020 14  6 - 23 mg/dL Final  . Creatinine, Ser 10/18/2020 0.77  0.40 - 1.20 mg/dL Final  . Total Bilirubin 10/18/2020 0.5  0.2 - 1.2 mg/dL Final  . Alkaline Phosphatase 10/18/2020 62  39 - 117 U/L Final  . AST 10/18/2020 20  0 - 37 U/L Final  . ALT 10/18/2020 16  0 - 35 U/L Final  . Total Protein 10/18/2020 6.6  6.0 - 8.3 g/dL Final  . Albumin 10/18/2020 4.0  3.5 - 5.2 g/dL Final  . GFR 10/18/2020 74.99  >60.00  mL/min Final   Calculated using the CKD-EPI Creatinine Equation (2021)  . Calcium 10/18/2020 8.9  8.4 - 10.5 mg/dL Final  . Hgb A1c MFr Bld 10/18/2020 7.1* 4.6 - 6.5 % Final   Glycemic Control Guidelines for People with Diabetes:Non Diabetic:  <6%Goal of Therapy: <7%Additional Action Suggested:  >8%      Allergies as of 10/20/2020      Reactions   Rosuvastatin Calcium    Headaches, fatigue      Medication List       Accurate as of October 20, 2020 10:19 AM. If you have any questions, ask your nurse or doctor.        ASHWAGANDHA PO Take 1 tablet by mouth daily.   Cod Liver Oil 1000 MG Caps Take 2,000 mg by mouth daily.   CoQ10 200 MG Caps Take 200 mg by mouth daily.   Dexcom G6 Sensor Misc by Does not apply route.   Dexcom G6 Transmitter Misc by Does not apply route.   Eliquis 5 MG Tabs tablet Generic drug: apixaban TAKE 1 TABLET BY MOUTH TWICE A DAY   glucose blood test strip Commonly known as: ONE TOUCH ULTRA TEST Use asto check blood sugar 5 times a day   insulin aspart 100 UNIT/ML injection Commonly known as: NovoLOG USE MAX  OF 60 UNITS DAILY VIA INSULIN PUMP.   losartan 25 MG tablet Commonly known as: COZAAR Take 1 tablet (25 mg total) by mouth daily.   NON FORMULARY Systemic Formulas Pancreas supplement, 1 per day "                    "           Metabo-Shake (Glycemic Support) 1 scoop per day Reishi w/ Curcumin 1 per day   NON FORMULARY Systemic Formulas Glycemic Balance, 2 per day "                  "             Eyes supplement, 2 per day "                  "              ROX (Antioxidant w/ Reservatrol), 1 per day   NON FORMULARY Take 15 mLs by mouth daily. MAG CAL VANILLA LIQUID   NON FORMULARY Take 1 tablet by mouth 2 (two) times daily. BioFunction "I"   NON FORMULARY Take 1 tablet by mouth daily. Reishi w/ Curcuma   NON FORMULARY 2 capsules daily. astachol ox   RESVERATROL PO Take 250 mg by mouth daily.   SUPER OMEGA-3 PO Take by  mouth. Takes 2 capsules per day   Turmeric Curcumin 500 MG Caps Take 1 tablet by mouth daily.   Vitamin B-12 5000 MCG Subl Place 5,000 mcg under the tongue daily.   vitamin  C 1000 MG tablet Take 1,000 mg by mouth daily.   VITAMIN D PO Take 5,000 mcg by mouth daily.       Allergies:  Allergies  Allergen Reactions  . Rosuvastatin Calcium     Headaches, fatigue    Past Medical History:  Diagnosis Date  . Anxiety    new dx  . Atrial fibrillation (HCC)    a. 03/2015 s/p RFCA;  b. CHA2DS2VASc = 5-->Eliquis.  . 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 Diabetes mellitus type 1 (HCC)    Insulin pump  . Diastolic CHF (HCC)    a. 12/2014 - due to AF RVR;  b. 03/2015 Echo: EF 60-65%, no rwma.  . Hyperlipidemia   . Hypertension   . Hypothyroidism   . Insulin pump in place   . Ovarian cyst   . S/P radiation therapy 04/10/12- 05/26/2012   left Breast and Axilla / 50 gy / 25 Fractions with Left Breast Boost / 10 Gy / 5 Fractions  . SIADH (syndrome of inappropriate ADH production) (HCC)   . Use of letrozole (Femara) 06/13/12    Past Surgical History:  Procedure Laterality Date  . APPENDECTOMY    . 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 COLONOSCOPY     1 polyp   . ELECTROPHYSIOLOGIC STUDY N/A 04/08/2015   PVI + CTI ablation + posterior Box lesion + Atach ablation by Dr Johney Frame  . OVARIAN CYST SURGERY    . TEE WITHOUT CARDIOVERSION N/A 04/07/2015   Procedure: TRANSESOPHAGEAL ECHOCARDIOGRAM (TEE);  Surgeon: Vesta Mixer, MD;  Location: [facility] ENDOSCOPY;  Service: Cardiovascular;  Laterality: N/A;  . TONSILLECTOMY      Family History  Problem Relation Age of Onset  . Breast cancer Mother 42  . Heart failure Mother   . Prostate  cancer Maternal Uncle        died in his 44s  . Heart attack Father   . Diabetes Maternal Uncle 15    Social History:  reports that she has never smoked. She has never used smokeless tobacco. She reports that she does not drink alcohol and does not use drugs.  REVIEW of systems:     She has a history of  Hypothyroidism followed by a holistic physician  Previously on Armour Thyroid and is apparently applying a cream on her wrists containing custom formulated hormone from the holistic physician She does think she gets more tired now  TSH appears to be gradually increasing Lab Results  Component Value Date   TSH 4.70 (H) 10/18/2020   TSH 3.50 02/29/2020   TSH 2.68 11/26/2019   FREET4 0.94 10/18/2020   FREET4 1.05 02/29/2020   FREET4 1.06 11/26/2019   Lab Results  Component Value Date   T3FREE 2.6 10/18/2020   T3FREE 2.4 02/29/2020     HYPERCHOLESTEROLEMIA: Her LDL has been persistently high and refuses to take a statin drug including pravastatin even though it had no side effects previously.  She was previously trying to use red rice yeast Previously her LDL particle number was high at 1621  She was given a prescription for Crestor which she tried for 3-4 weeks but did not continue because she felt it was giving her headaches and making her tired  She prefers to  use natural remedies but her lipids are significantly higher now  Her LDL levels as below   Lab Results  Component Value Date   CHOL 262 (H) 10/18/2020   CHOL 232 (H) 02/29/2020   CHOL 223 (H) 06/01/2019   Lab Results  Component Value Date   HDL 68.10 10/18/2020   HDL 66.30 02/29/2020   HDL 64.80 06/01/2019   Lab Results  Component Value Date   LDLCALC 181 (H) 10/18/2020   LDLCALC 150 (H) 02/29/2020   LDLCALC 145 (H) 06/01/2019   Lab Results  Component Value Date   TRIG 67.0 10/18/2020   TRIG 78.0 02/29/2020   TRIG 64.0 06/01/2019   Lab Results  Component Value Date   CHOLHDL 4 10/18/2020    CHOLHDL 4 02/29/2020   CHOLHDL 3 06/01/2019   Lab Results  Component Value Date   LDLDIRECT 148.4 08/06/2013   .  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 been longstanding, not on any diuretics or SSRIs  Urine osmolality as of 04/2015 was 618 She was told to cut back on her fluid intake She does at times drink excessive amounts of fluids  Sodium is variable and now 129 compared to 131, has been as low as 126   Lab Results  Component Value Date   NA 129 (L) 10/18/2020   K 4.5 10/18/2020   CL 95 (L) 10/18/2020   CO2 29 10/18/2020   She is reluctant to take the second Covid vaccine despite explaining the need for protection against the delta variant Also refuses the influenza vaccine   EXAM:  BP 134/82   Pulse 71   Ht $R'5\' 4"'OG$  (1.626 m)   Wt 157 lb 9.6 oz (71.5 kg)   SpO2 99%   BMI 27.05 kg/m     ASSESSMENT:  DIABETES type I on T-Slim pump  See history of present illness for detailed discussion of current diabetes management, blood sugar patterns and problems identified  Her A1c is excellent at 7.1  Recently her blood sugars are over 90% within target range She will occasionally have higher readings after some meals especially lunch from not taking enough boluses She will like to reduce her carbohydrate intake especially at breakfast  Today discussed sick day rules since she had a problem with infusions of malfunction causing severe hyperglycemia She was also advised about how to prevent hypoglycemia with exercise or increased physical activity and she was not aware of how to turn on the exercise mode Recently hypoglycemia has been minimal but may have low normal sugars right before breakfast  Again no evidence of complications  RECOMMENDATIONS:  Basal rate at 6 AM will be reduced to 1.0 instead of 1.1  Reminded her to bolus more consistently for all meals with estimating carbohydrates, may need higher boluses for high glycemic index foods like fruits Use exercise setting when  doing more strenuous activities and showed her how to do this   SIADH and hyponatremia: Previously diagnosed as idiopathic SIADH No causes identified recently Has only minimal hypothyroidism She was explained the need for fluid restriction and to cut back on fluid intake by about one third   Hyperlipidemia: Consistently not controlled and she has refused any treatment previously However LDL is about 30 mg higher now  Today explained the rationale for using Zetia as a medication with nonsystemic mode of action in the intestine and she agrees to try this   Hypothyroidism:  not clear if she has primary or secondary hypothyroidism TSH is relatively high and she  is complaining of fatigue Also free T3 level is low normal She reportedly is using a transdermal cream from her holistic physician but she thinks this was supposed to be for her adrenal gland She agrees to trial of Armour Thyroid 30 mg daily to see if she is symptomatically better  We will recheck her thyroid levels on the next labs   Follow-up in 3 months  Oyindamola Key 10/20/2020, 10:19 AM

## 2020-10-20 NOTE — Patient Instructions (Addendum)
Reduce fluids by one third  Thyroid pill before eating  Use exercise mode when very active  If sugar very high use pump to calculate injection dose

## 2020-11-01 ENCOUNTER — Encounter: Payer: PPO | Attending: Endocrinology | Admitting: Nutrition

## 2020-11-01 ENCOUNTER — Other Ambulatory Visit: Payer: Self-pay

## 2020-11-01 DIAGNOSIS — E1065 Type 1 diabetes mellitus with hyperglycemia: Secondary | ICD-10-CM | POA: Insufficient documentation

## 2020-11-02 NOTE — Progress Notes (Signed)
Patient is here with her husband to get her new Tandem pump set up.  Her's failed on Friday and she was taking Humalog q 4 hours.  She reports that blood sugars were "ok" and FBS today was 152.  Settings were transferred per Dr. Ronnie Derby last office note.  The pump was linked to Dexcom, but not able to open the T-Connect app on her phone, so not able to get this to the cloud.  Steps to do this were written on a sheet of paper and she will try this later when she gets home.  She will let me know when she does this, so that I can link her to this practice.  She filled a cartridge and started her pump before leaving.  Blood sugar was 122.  She was reminded that there would be no IOB on the pump and to wait 4 hours after her last injection before giving a bolus.  She agreed to do this and had no final questions.

## 2020-12-14 DIAGNOSIS — E1065 Type 1 diabetes mellitus with hyperglycemia: Secondary | ICD-10-CM | POA: Diagnosis not present

## 2021-01-02 ENCOUNTER — Other Ambulatory Visit: Payer: Self-pay | Admitting: Endocrinology

## 2021-01-12 ENCOUNTER — Other Ambulatory Visit: Payer: PPO

## 2021-01-13 DIAGNOSIS — E1065 Type 1 diabetes mellitus with hyperglycemia: Secondary | ICD-10-CM | POA: Diagnosis not present

## 2021-01-16 ENCOUNTER — Other Ambulatory Visit: Payer: PPO

## 2021-01-19 ENCOUNTER — Ambulatory Visit: Payer: PPO | Admitting: Endocrinology

## 2021-01-24 LAB — HM DIABETES EYE EXAM

## 2021-01-31 DIAGNOSIS — Z961 Presence of intraocular lens: Secondary | ICD-10-CM | POA: Diagnosis not present

## 2021-01-31 DIAGNOSIS — E109 Type 1 diabetes mellitus without complications: Secondary | ICD-10-CM | POA: Diagnosis not present

## 2021-02-07 ENCOUNTER — Other Ambulatory Visit (INDEPENDENT_AMBULATORY_CARE_PROVIDER_SITE_OTHER): Payer: PPO

## 2021-02-07 ENCOUNTER — Other Ambulatory Visit: Payer: Self-pay

## 2021-02-07 DIAGNOSIS — E1065 Type 1 diabetes mellitus with hyperglycemia: Secondary | ICD-10-CM

## 2021-02-07 DIAGNOSIS — E038 Other specified hypothyroidism: Secondary | ICD-10-CM | POA: Diagnosis not present

## 2021-02-07 DIAGNOSIS — E7800 Pure hypercholesterolemia, unspecified: Secondary | ICD-10-CM

## 2021-02-07 LAB — LIPID PANEL
Cholesterol: 199 mg/dL (ref 0–200)
HDL: 64.3 mg/dL (ref >39.00)
LDL Cholesterol: 120 mg/dL — ABNORMAL HIGH (ref 0–99)
NonHDL: 134.9
Total CHOL/HDL Ratio: 3
Triglycerides: 74 mg/dL (ref 0.0–149.0)
VLDL: 14.8 mg/dL (ref 0.0–40.0)

## 2021-02-07 LAB — COMPREHENSIVE METABOLIC PANEL
ALT: 19 U/L (ref 0–35)
AST: 23 U/L (ref 0–37)
Albumin: 3.9 g/dL (ref 3.5–5.2)
Alkaline Phosphatase: 62 U/L (ref 39–117)
BUN: 11 mg/dL (ref 6–23)
CO2: 29 mEq/L (ref 19–32)
Calcium: 9.1 mg/dL (ref 8.4–10.5)
Chloride: 95 mEq/L — ABNORMAL LOW (ref 96–112)
Creatinine, Ser: 0.82 mg/dL (ref 0.40–1.20)
GFR: 69.38 mL/min (ref >60.00)
Glucose, Bld: 174 mg/dL — ABNORMAL HIGH (ref 70–99)
Potassium: 4.8 mEq/L (ref 3.5–5.1)
Sodium: 130 mEq/L — ABNORMAL LOW (ref 135–145)
Total Bilirubin: 0.5 mg/dL (ref 0.2–1.2)
Total Protein: 6.9 g/dL (ref 6.0–8.3)

## 2021-02-07 LAB — T3, FREE: T3, Free: 3.1 pg/mL (ref 2.3–4.2)

## 2021-02-07 LAB — T4, FREE: Free T4: 0.94 ng/dL (ref 0.60–1.60)

## 2021-02-07 LAB — TSH: TSH: 4.93 u[IU]/mL — ABNORMAL HIGH (ref 0.35–4.50)

## 2021-02-07 LAB — HEMOGLOBIN A1C: Hgb A1c MFr Bld: 7.2 % — ABNORMAL HIGH (ref 4.6–6.5)

## 2021-02-13 DIAGNOSIS — E1065 Type 1 diabetes mellitus with hyperglycemia: Secondary | ICD-10-CM | POA: Diagnosis not present

## 2021-02-13 NOTE — Progress Notes (Signed)
Patient ID: Gina [name], female   DOB: 1946-06-15, 75 y.o.   MRN: [id number]      Reason for Appointment: Insulin Pump followup:   History of Present Illness   Diagnosis: Type 1 DIABETES MELITUS, date of diagnosis:  1984      HISTORY:   T-Slim PUMP SETTINGS are:  12 AM-2 AM = 0.3, 2 AM-4 AM = 0.45, 4 AM-8 AM = 1.10, 8 AM-11:30 AM = 0.9, 11:30 AM = 1.0; 2 PM = 0. 90, 6 PM = 0.7,10 PM-12 AM = 0.4  Total basal insulin 19 units  Carbohydrate ratio 1: 8 at all meals Blood sugar target 100.  Correction factor 1: 50 Active insulin 4 hours   Current management, blood sugar patterns and problems identified:  A1c is 7.2, previously 7.1 and stable   She has overall good control with over 80% within target range although better than previously  As before she has difficulty getting consistent coverage for some of her meals  She may be underestimating her carbohydrates periodically at lunch or dinner higher readings  She also thinks she may be occasionally eating larger portions than anticipated but not appearing to have high readings after snacks  Currently not exercising and not using the exercise no  She does use a sleep mode at night which is usually after 10 PM  She says that occasionally her sensor would not last more than 7 or 8 days  She is again not comfortable in making changes in her pump settings   CONTINUOUS GLUCOSE MONITORING RECORD INTERPRETATION     Glycemic patterns are as follows: Average blood sugar is within the target range all 24 hours  She does have more variability in the mid afternoon and around 8-9 PM  OVERNIGHT blood sugars are somewhat variable around 11-1 AM but otherwise fairly well controlled most of the time  HIGHEST blood sugars on average are around 1-3 PM and similarly around 8-9 PM averaging about 170-180  POSTPRANDIAL blood sugars are excellent after breakfast, variable after lunch and dinner  Highest blood sugars are  between 200-250 occasionally after meals in the afternoon and evening  Hypoglycemia has not been significant with only mild transient low sugars midnight or 5-6 AM  Dexcom data:   CGM use % of time   2-week average/SD  142/34  Time in range  86     %  % Time Above 180  14  % Time above 250   % Time Below 70 0      PRE-MEAL  overnight  mornings  afternoon  evening Overall  Glucose range:       Averages:  128  135  153  152    Previous data:   CGM use % of time  98  2-week average/SD  133  Time in range  93       %  % Time Above 180  7  % Time above 250   % Time Below 70 0      PRE-MEAL  overnight  mornings  afternoon  evening Overall  Glucose range:       Averages:  129  121  143+/-30  142      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  Latest known visit with results is:  Abstract on 02/07/2021  Component Date Value Ref Range Status  . HM Diabetic Eye Exam 01/24/2021 No Retinopathy  No Retinopathy Final     Allergies as of 02/14/2021      Reactions   Rosuvastatin Calcium    Headaches, fatigue      Medication List       Accurate as of Feb 14, 2021  3:58 PM. If you have any questions, ask your nurse or doctor.        Armour Thyroid 30 MG tablet Generic drug: thyroid TAKE 1 TABLET BY MOUTH DAILY BEFORE BREAKFAST.   ASHWAGANDHA PO Take 1 tablet by mouth daily.   Cod Liver Oil 1000 MG Caps Take 2,000 mg by mouth daily.    CoQ10 200 MG Caps Take 200 mg by mouth daily.   Dexcom G6 Sensor Misc by Does not apply route.   Dexcom G6 Transmitter Misc by Does not apply route.   Eliquis 5 MG Tabs tablet Generic drug: apixaban TAKE 1 TABLET BY MOUTH TWICE A DAY   ezetimibe 10 MG tablet Commonly known as: Zetia Take 1 tablet (10 mg total) by mouth daily.   glucose blood test strip Commonly known as: ONE TOUCH ULTRA TEST Use asto check blood sugar 5 times a day   insulin aspart 100 UNIT/ML injection Commonly known as: NovoLOG USE MAX OF 60 UNITS DAILY VIA INSULIN PUMP.   losartan 25 MG tablet Commonly known as: COZAAR Take 1 tablet (25 mg total) by mouth daily.   NON FORMULARY Systemic Formulas Pancreas supplement, 1 per day "                    "           Metabo-Shake (Glycemic Support) 1 scoop per day Reishi w/ Curcumin 1 per day   NON FORMULARY Systemic Formulas Glycemic Balance, 2 per day "                  "             Eyes supplement, 2 per day "                  "              ROX (Antioxidant w/ Reservatrol), 1 per day   NON FORMULARY Take 15 mLs by mouth daily. MAG CAL VANILLA LIQUID   NON FORMULARY Take 1 tablet by mouth 2 (two) times daily. BioFunction "I"   NON FORMULARY Take 1 tablet by mouth daily. Reishi w/ Curcuma   NON FORMULARY 2 capsules daily. astachol ox   RESVERATROL PO Take 250 mg by mouth daily.   SUPER OMEGA-3 PO Take by mouth. Takes 2 capsules per day   Turmeric Curcumin 500 MG Caps Take 1 tablet by mouth daily.   Vitamin B-12 5000 MCG Subl Place 5,000 mcg under the tongue daily.   vitamin C 1000 MG tablet Take 1,000 mg by mouth daily.   VITAMIN D PO Take 5,000 mcg by mouth  daily.       Allergies:  Allergies  Allergen Reactions  . Rosuvastatin Calcium     Headaches, fatigue    Past Medical History:  Diagnosis Date  . Anxiety    new dx  . Atrial fibrillation ([place])    a. 03/2015 s/p RFCA;  b. CHA2DS2VASc = 5-->Eliquis.  . 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 Diabetes mellitus type 1 (HCC)    Insulin pump  . Diastolic CHF ([place])    a. 03/4402 - due to AF RVR;  b. 03/2015 Echo: EF 60-65%, no rwma.  . Hyperlipidemia   . Hypertension   . Hypothyroidism   . Insulin pump in place   . Ovarian cyst   . S/P radiation therapy 04/10/12- 05/26/2012   left Breast and Axilla / 50 gy / 25 Fractions with Left Breast Boost / 10 Gy / 5 Fractions  . SIADH (syndrome of inappropriate ADH production) ([place])   . Use of letrozole ([facility]) 06/13/12    Past Surgical History:  Procedure Laterality Date  . APPENDECTOMY    . 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 COLONOSCOPY     1 polyp   . ELECTROPHYSIOLOGIC STUDY N/A 04/08/2015   PVI + CTI ablation + posterior Box lesion + Atach ablation by [name]  . OVARIAN CYST SURGERY    . TEE WITHOUT CARDIOVERSION N/A 04/07/2015   Procedure: TRANSESOPHAGEAL ECHOCARDIOGRAM (TEE);  Surgeon: Thayer Headings, MD;  Location: [facility] ENDOSCOPY;  Service: Cardiovascular;  Laterality: N/A;  . TONSILLECTOMY      Family History  Problem Relation Age of Onset  . Breast cancer Mother 28  . Heart failure Mother   . Prostate cancer Maternal Uncle        died in his 65s  . Heart attack Father   . Diabetes Maternal Uncle 15    Social History:  reports that she has never smoked. She has never used smokeless tobacco. She reports that she does not drink alcohol and does not use drugs.  REVIEW of systems:     She has a history of  Hypothyroidism followed by a holistic physician  She was started back on Armour Thyroid, on her last visit was applying a cream on her wrists containing custom formulated hormone from the holistic physician She does think she gets  more tired and this is not any better  TSH appears to be still increasing even with her taking her Armour Thyroid before breakfast daily T3 is slightly better  Lab Results  Component Value Date   TSH 4.93 (H) 02/07/2021   TSH 4.70 (H) 10/18/2020   TSH 3.50 02/29/2020   FREET4 0.94 02/07/2021   FREET4 0.94 10/18/2020   FREET4 1.05 02/29/2020   Lab Results  Component Value Date   T3FREE 3.1 02/07/2021   T3FREE 2.6 10/18/2020     HYPERCHOLESTEROLEMIA: Her LDL has been persistently high and refuses to take a statin drug including pravastatin even though it had no side effects previously. Previously her LDL particle number was high at 1621  She was given a prescription for Crestor which she tried for 3-4 weeks but did not continue because she felt it was giving her headaches and making her tired  She prefers to use natural remedies  and she is likely taking red rice yeast and another supplement with unknown constituents, LDL is better with normal liver functions.  She states that he is taking the Zetia prescribed also  Her LDL levels as below   Lab Results  Component Value Date   CHOL 199 02/07/2021   CHOL 262 (H) 10/18/2020   CHOL 232 (H) 02/29/2020   Lab Results  Component Value Date   HDL 64.30 02/07/2021   HDL 68.10 10/18/2020   HDL 66.30 02/29/2020   Lab Results  Component Value Date   LDLCALC 120 (H) 02/07/2021   LDLCALC 181 (H) 10/18/2020   LDLCALC 150 (H) 02/29/2020   Lab Results  Component Value Date   TRIG 74.0 02/07/2021   TRIG 67.0 10/18/2020   TRIG 78.0 02/29/2020   Lab Results  Component Value Date   CHOLHDL 3 02/07/2021   CHOLHDL 4 10/18/2020   CHOLHDL 4 02/29/2020   Lab Results  Component Value Date   LDLDIRECT 148.4 08/06/2013   .  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 been longstanding, not on any diuretics or SSRIs  Urine osmolality as of 04/2015 was 618 She was told to cut back on her fluid intake  Sodium is variable but generally between 129-131,  has been as low as 126   Lab Results  Component Value Date   NA 130 (L) 02/07/2021   K 4.8 02/07/2021   CL 95 (L) 02/07/2021   CO2 29 02/07/2021   She refused the second COVID-vaccine  Also refuses the influenza vaccine   EXAM:  BP 132/86   Pulse 68   Ht _0  (1.626 m)   Wt 157 lb 6.4 oz (71.4 kg)   SpO2 97%   BMI 27.02 kg/m     ASSESSMENT:  DIABETES type I on T-Slim pump  See history of present illness for detailed discussion of current diabetes management, blood sugar patterns and problems identified  Her A1c is excellent at 7.2  Recently her blood sugars are 86% within target range Has minimal hypoglycemia As before we will periodically have higher readings after some meals especially lunch from not taking enough boluses for what she is eating Does have some tendency to low normal sugars around midnight Usually not getting significantly high readings during the night and is continuing to use a sleep mode.   RECOMMENDATIONS:  Basal rate at 10 PM will be reduced to 0.3, this was done for her in the office  Reminded her to bolus more appropriately for what she is eating especially at lunchtime and more for certain foods like fruits  She should also not override the pump excessively Use exercise setting when doing walking or other activities like yard work Discouraged her from suspending her pump   SIADH and hyponatremia: Stable Previously diagnosed as idiopathic SIADH No causes identified She was explained the need for fluid restriction since she cannot eliminate excess water   Hyperlipidemia: Appears to be better with taking OTC products and Zetia   Hypothyroidism:  not clear if she has primary or secondary hypothyroidism TSH is again high and she is complaining of fatigue despite starting 30 mg of Armour Thyroid, previously also her T3 level has been low normal She will go up to 45 mg using half tablet of the 90 mg tablet that she can split in  half To recheck her thyroid levels on the next visit   Follow-up in 3 months  Ruta Capece 02/14/2021, 3:58 PM

## 2021-02-14 ENCOUNTER — Other Ambulatory Visit: Payer: Self-pay

## 2021-02-14 ENCOUNTER — Encounter: Payer: Self-pay | Admitting: Endocrinology

## 2021-02-14 ENCOUNTER — Ambulatory Visit: Payer: PPO | Admitting: Endocrinology

## 2021-02-14 VITALS — BP 132/86 | HR 68 | Ht 64.0 in | Wt 157.4 lb

## 2021-02-14 DIAGNOSIS — E7800 Pure hypercholesterolemia, unspecified: Secondary | ICD-10-CM | POA: Diagnosis not present

## 2021-02-14 DIAGNOSIS — E1065 Type 1 diabetes mellitus with hyperglycemia: Secondary | ICD-10-CM | POA: Diagnosis not present

## 2021-02-14 DIAGNOSIS — E871 Hypo-osmolality and hyponatremia: Secondary | ICD-10-CM | POA: Diagnosis not present

## 2021-02-14 DIAGNOSIS — E038 Other specified hypothyroidism: Secondary | ICD-10-CM | POA: Diagnosis not present

## 2021-02-14 MED ORDER — THYROID 90 MG PO TABS: 45.0000 mg | ORAL_TABLET | Freq: Every day | ORAL | 1 refills | Status: DC

## 2021-02-17 DIAGNOSIS — E1065 Type 1 diabetes mellitus with hyperglycemia: Secondary | ICD-10-CM | POA: Diagnosis not present

## 2021-02-20 ENCOUNTER — Other Ambulatory Visit: Payer: Self-pay | Admitting: *Deleted

## 2021-02-20 ENCOUNTER — Telehealth: Payer: Self-pay | Admitting: Endocrinology

## 2021-02-20 ENCOUNTER — Other Ambulatory Visit: Payer: Self-pay | Admitting: Internal Medicine

## 2021-02-20 MED ORDER — THYROID 30 MG PO TABS: ORAL_TABLET | ORAL | 11 refills | Status: DC

## 2021-02-20 MED ORDER — THYROID 15 MG PO TABS: ORAL_TABLET | ORAL | 11 refills | Status: DC

## 2021-02-20 NOTE — Telephone Encounter (Signed)
Pt called because she would like a call from a nurse to see what she can do regarding her armour thyroid medication. She says the instructions are to take 1/2 tablet and she cant get them to split evenly. She would like to know if there is an alternative for this medication so that she wont have to split it every time?  Callback ph# [phone]

## 2021-02-20 NOTE — Telephone Encounter (Signed)
Pt's age 75, wt 71.4 kg, SCr 0.82, CrCl 65.79, last ov w/ JA 09/26/20.

## 2021-02-20 NOTE — Telephone Encounter (Signed)
Alternative would be to take 2 separate prescriptions for 30 mg and 15 mg, 1 each every day.  Is she using a pill splitter?  Even if she is not splitting them evenly it is okay

## 2021-02-20 NOTE — Telephone Encounter (Signed)
I spoke with the patient and she said she had been cutting them with a knife but she bought a pill splitter yesterday thinking that would be better, but it made the pills crumble worse than the knife, she requested the two separate tablets be sent to her pharmacy.  Those have been e-scribed.

## 2021-03-15 DIAGNOSIS — E1065 Type 1 diabetes mellitus with hyperglycemia: Secondary | ICD-10-CM | POA: Diagnosis not present

## 2021-03-16 ENCOUNTER — Ambulatory Visit (HOSPITAL_COMMUNITY): Payer: PPO | Admitting: Nurse Practitioner

## 2021-03-21 ENCOUNTER — Other Ambulatory Visit: Payer: Self-pay

## 2021-03-21 ENCOUNTER — Encounter (HOSPITAL_COMMUNITY): Payer: Self-pay | Admitting: Nurse Practitioner

## 2021-03-21 ENCOUNTER — Ambulatory Visit (HOSPITAL_COMMUNITY)
Admission: RE | Admit: 2021-03-21 | Discharge: 2021-03-21 | Disposition: A | Discharge status: Home / Self Care | Payer: PPO | Source: Ambulatory Visit | Attending: Nurse Practitioner | Admitting: Nurse Practitioner

## 2021-03-21 VITALS — BP 164/68 | HR 68 | Ht 64.0 in | Wt 156.8 lb

## 2021-03-21 DIAGNOSIS — Z794 Long term (current) use of insulin: Secondary | ICD-10-CM | POA: Insufficient documentation

## 2021-03-21 DIAGNOSIS — I480 Paroxysmal atrial fibrillation: Secondary | ICD-10-CM | POA: Insufficient documentation

## 2021-03-21 DIAGNOSIS — Z79899 Other long term (current) drug therapy: Secondary | ICD-10-CM | POA: Insufficient documentation

## 2021-03-21 DIAGNOSIS — I10 Essential (primary) hypertension: Secondary | ICD-10-CM | POA: Diagnosis not present

## 2021-03-21 DIAGNOSIS — E119 Type 2 diabetes mellitus without complications: Secondary | ICD-10-CM | POA: Diagnosis not present

## 2021-03-21 DIAGNOSIS — I4819 Other persistent atrial fibrillation: Secondary | ICD-10-CM

## 2021-03-21 DIAGNOSIS — Z7901 Long term (current) use of anticoagulants: Secondary | ICD-10-CM | POA: Insufficient documentation

## 2021-03-21 DIAGNOSIS — D6869 Other thrombophilia: Secondary | ICD-10-CM

## 2021-03-21 DIAGNOSIS — Z79890 Hormone replacement therapy (postmenopausal): Secondary | ICD-10-CM | POA: Insufficient documentation

## 2021-03-21 NOTE — Progress Notes (Signed)
Primary Care Physician: Gina Frizzle, MD Referring Physician: Dr. Leonides Grills [name] is a 75 y.o. female with a h/o paroxysmal  afib s/p ablation 04/08/15. She had done very well with no reoccurrence of  sustained afib.She is off antiarrythmic's and rate control for some time. She continue on eliquis without bleeding problems.   Today, she denies symptoms of palpitations, chest pain, shortness of breath, orthopnea, PND, lower extremity edema, dizziness, presyncope, syncope, or neurologic sequela. The patient is tolerating medications without difficulties and is otherwise without complaint today.   Past Medical History:  Diagnosis Date  . Anxiety    new dx  . Atrial fibrillation (Gina [name])    a. 03/2015 s/p RFCA;  b. CHA2DS2VASc = 5-->Eliquis.  . 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 Diabetes mellitus type 1 (HCC)    Insulin pump  . Diastolic CHF ([place])    a. 11/8784 - due to AF RVR;  b. 03/2015 Echo: EF 60-65%, no rwma.  . Hyperlipidemia   . Hypertension   . Hypothyroidism   . Insulin pump in place   . Ovarian cyst   . S/P radiation therapy 04/10/12- 05/26/2012   left Breast and Axilla / 50 gy / 25 Fractions with Left Breast Boost / 10 Gy / 5 Fractions  . SIADH (syndrome of inappropriate ADH production) ([place])   . Use of letrozole ([facility]) 06/13/12   Past Surgical History:  Procedure Laterality Date  . APPENDECTOMY    . 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 COLONOSCOPY     1 polyp   . ELECTROPHYSIOLOGIC STUDY N/A 04/08/2015   PVI + CTI ablation + posterior Box lesion + Atach ablation by [name]  . OVARIAN CYST SURGERY    . TEE WITHOUT CARDIOVERSION N/A 04/07/2015   Procedure: TRANSESOPHAGEAL ECHOCARDIOGRAM (TEE);   Surgeon: Thayer Headings, MD;  Location: [facility];  Service: Cardiovascular;  Laterality: N/A;  . TONSILLECTOMY      Current Outpatient Medications  Medication Sig Dispense Refill  . Ascorbic Acid (VITAMIN C) 1000 MG tablet Take 1,000 mg by mouth daily.     . ASHWAGANDHA PO Take 1 tablet by mouth daily. 347m    . Cholecalciferol (VITAMIN D PO) Take 5,000 mcg by mouth daily.    . Coenzyme Q10 (COQ10) 200 MG CAPS Take 200 mg by mouth daily.    . Continuous Blood Gluc Sensor (DEXCOM G6 SENSOR) MISC by Does not apply route.    . Continuous Blood Gluc Transmit (DEXCOM G6 TRANSMITTER) MISC by Does not apply route.    . Cyanocobalamin (VITAMIN B-12) 5000 MCG SUBL Place 5,000 mcg under the tongue daily.    .Marland KitchenELIQUIS 5 MG TABS tablet TAKE 1 TABLET BY MOUTH TWICE A DAY 60 tablet 6  . ezetimibe (ZETIA) 10 MG tablet Take 1 tablet (10 mg total) by mouth daily. 90 tablet 3  . glucose blood (ONE TOUCH ULTRA TEST) test strip Use asto check blood sugar 5 times a day 200 each 5  . insulin aspart (NOVOLOG) 100 UNIT/ML injection USE MAX OF 60 UNITS DAILY VIA INSULIN PUMP. 180 mL 0  . losartan (COZAAR) 25 MG tablet Take 1 tablet (25 mg total) by mouth daily. 90 tablet 3  . Misc Natural Products (  GLUCOSAMINE CHOND MSM FORMULA PO) Take by mouth. Taking 1 TBSP daily    . Multiple Vitamin (MULTI VITAMIN PO) Take 300 mg by mouth daily in the afternoon. Exipure- supports healthy weight loss-    . Multiple Vitamin (MULTIVITAMIN PO) Take by mouth. Iodoral- Taking one tablet by mouth daily- 12.3m    . Multiple Vitamin (MULTIVITAMIN PO) Take by mouth. Cholestar- Vitamin K27-  Taking 574m- one tablet by mouth daily    . NON FORMULARY Systemic Formulas Pancreas supplement, 1 per day "                    "           Metabo-Shake (Glycemic Support) 1 scoop per day Reishi w/ Curcumin 1 per day    . NON FORMULARY Systemic Formulas Glycemic Balance, 2 per day "                  "             Eyes supplement, 2 per  day "                  "              ROX (Antioxidant w/ Reservatrol), 1 per day    . NON FORMULARY Take 15 mLs by mouth daily. MAG CAL VANILLA LIQUID    . Omega-3 Fatty Acids (SUPER OMEGA-3 PO) Take by mouth. Takes 2 capsules per day    . Red Yeast Rice Extract (RED YEAST RICE PO) Take 1,200 mg by mouth daily in the afternoon.    . Marland KitchenESVERATROL PO Take 960 mg by mouth daily.    . Marland Kitchenhyroid (ARMOUR) 15 MG tablet Take 1 tablet daily with the 30 mg tablet (total of 45 mg daily) 30 tablet 11  . thyroid (ARMOUR) 30 MG tablet Take 1 tablet daily along with the 15 mg tablet for a total of 45 mg daily 30 tablet 11  . Turmeric Curcumin 500 MG CAPS Take 482 mg by mouth daily.    . Marland KitchenITAMIN A PO Take by mouth. 10,500 IU- Take 1 tablet by mouth daily- For Eyes     No current facility-administered medications for this encounter.   Facility-Administered Medications Ordered in Other Encounters  Medication Dose Route Frequency Provider Last Rate Last Admin  . topical emolient (BIAFINE) emulsion   Topical Daily SqEppie GibsonMD   Given at 05/28/12 1400    Allergies  Allergen Reactions  . Rosuvastatin Calcium     Headaches, fatigue    Social History   Socioeconomic History  . Marital status: Married    Spouse name: Not on file  . Number of children: Not on file  . Years of education: Not on file  . Highest education level: Not on file  Occupational History  . Not on file  Tobacco Use  . Smoking status: Never Smoker  . Smokeless tobacco: Never Used  Substance and Sexual Activity  . Alcohol use: No  . Drug use: No  . Sexual activity: Yes  Other Topics Concern  . Not on file  Social History Narrative  . Not on file   Social Determinants of Health   Financial Resource Strain: Not on file  Food Insecurity: Not on file  Transportation Needs: Not on file  Physical Activity: Not on file  Stress: Not on file  Social Connections: Not on file  Intimate Partner Violence: Not on file  Family History  Problem Relation Age of Onset  . Breast cancer Mother 31  . Heart failure Mother   . Prostate cancer Maternal Uncle        died in his 35s  . Heart attack Father   . Diabetes Maternal Uncle 15    ROS- All systems are reviewed and negative except as per the HPI above  Physical Exam: Vitals:   03/21/21 1025  BP: (!) 164/68  Pulse: 68  Weight: 71.1 kg  Height: _0  (1.626 m)   Wt Readings from Last 3 Encounters:  03/21/21 71.1 kg  02/14/21 71.4 kg  10/20/20 71.5 kg    Labs: Lab Results  Component Value Date   NA 130 (L) 02/07/2021   K 4.8 02/07/2021   CL 95 (L) 02/07/2021   CO2 29 02/07/2021   GLUCOSE 174 (H) 02/07/2021   BUN 11 02/07/2021   CREATININE 0.82 02/07/2021   CALCIUM 9.1 02/07/2021   MG 1.7 01/13/2015   Lab Results  Component Value Date   INR 0.98 01/11/2015   Lab Results  Component Value Date   CHOL 199 02/07/2021   HDL 64.30 02/07/2021   LDLCALC 120 (H) 02/07/2021   TRIG 74.0 02/07/2021     GEN- The patient is well appearing, alert and oriented x 3 today.   Head- normocephalic, atraumatic Eyes-  Sclera clear, conjunctiva pink Ears- hearing intact Oropharynx- clear Neck- supple, no JVP Lymph- no cervical lymphadenopathy Lungs- Clear to ausculation bilaterally, normal work of breathing Heart- Regular rate and rhythm, no murmurs, rubs or gallops, PMI not laterally displaced GI- soft, NT, ND, + BS Extremities- no clubbing, cyanosis, or edema MS- no significant deformity or atrophy Skin- no rash or lesion Psych- euthymic mood, full affect Neuro- strength and sensation are intact  EKG-NSR  at 68 bpm, pr int 162 ms, qrs int 76 ms, qtc 425 ms  PR int 146 ms, qrs int 76 ms, qtc 416 ms    Assessment and Plan: 1. Afib Very quiet in the 6 years following ablation   2. CHA2DS2VASc score of at least 4 Eliquis appropriately dosed at 5 mg bid No bleeding issues  3. HTN Stable  4. DM Per pt controlled Per Dr.  Dwyane Dee  F/u in afib clinic in 6 months   Geroge Baseman. Kyla Duffy, [facility] [address] [place], [place] [address] [phone]

## 2021-04-14 DIAGNOSIS — E1065 Type 1 diabetes mellitus with hyperglycemia: Secondary | ICD-10-CM | POA: Diagnosis not present

## 2021-05-15 DIAGNOSIS — E1065 Type 1 diabetes mellitus with hyperglycemia: Secondary | ICD-10-CM | POA: Diagnosis not present

## 2021-05-16 ENCOUNTER — Other Ambulatory Visit: Payer: Self-pay

## 2021-05-16 ENCOUNTER — Other Ambulatory Visit (INDEPENDENT_AMBULATORY_CARE_PROVIDER_SITE_OTHER): Payer: PPO

## 2021-05-16 DIAGNOSIS — E1065 Type 1 diabetes mellitus with hyperglycemia: Secondary | ICD-10-CM

## 2021-05-16 DIAGNOSIS — E038 Other specified hypothyroidism: Secondary | ICD-10-CM | POA: Diagnosis not present

## 2021-05-16 DIAGNOSIS — E7800 Pure hypercholesterolemia, unspecified: Secondary | ICD-10-CM

## 2021-05-16 LAB — COMPREHENSIVE METABOLIC PANEL
ALT: 24 U/L (ref 0–35)
AST: 26 U/L (ref 0–37)
Albumin: 4 g/dL (ref 3.5–5.2)
Alkaline Phosphatase: 69 U/L (ref 39–117)
BUN: 15 mg/dL (ref 6–23)
CO2: 29 mEq/L (ref 19–32)
Calcium: 9 mg/dL (ref 8.4–10.5)
Chloride: 96 mEq/L (ref 96–112)
Creatinine, Ser: 0.85 mg/dL (ref 0.40–1.20)
GFR: 66.33 mL/min (ref >60.00)
Glucose, Bld: 202 mg/dL — ABNORMAL HIGH (ref 70–99)
Potassium: 4.6 mEq/L (ref 3.5–5.1)
Sodium: 132 mEq/L — ABNORMAL LOW (ref 135–145)
Total Bilirubin: 0.6 mg/dL (ref 0.2–1.2)
Total Protein: 6.9 g/dL (ref 6.0–8.3)

## 2021-05-16 LAB — MICROALBUMIN / CREATININE URINE RATIO
Creatinine,U: 88.1 mg/dL
Microalb Creat Ratio: 0.8 mg/g (ref 0.0–30.0)
Microalb, Ur: <0.7 mg/dL (ref 0.0–1.9)

## 2021-05-16 LAB — LIPID PANEL
Cholesterol: 193 mg/dL (ref 0–200)
HDL: 64.8 mg/dL (ref >39.00)
LDL Cholesterol: 116 mg/dL — ABNORMAL HIGH (ref 0–99)
NonHDL: 128.64
Total CHOL/HDL Ratio: 3
Triglycerides: 65 mg/dL (ref 0.0–149.0)
VLDL: 13 mg/dL (ref 0.0–40.0)

## 2021-05-16 LAB — T4, FREE: Free T4: 0.9 ng/dL (ref 0.60–1.60)

## 2021-05-16 LAB — TSH: TSH: 2.67 u[IU]/mL (ref 0.35–5.50)

## 2021-05-16 LAB — HEMOGLOBIN A1C: Hgb A1c MFr Bld: 7.5 % — ABNORMAL HIGH (ref 4.6–6.5)

## 2021-05-16 LAB — T3, FREE: T3, Free: 3.4 pg/mL (ref 2.3–4.2)

## 2021-05-18 ENCOUNTER — Encounter: Payer: Self-pay | Admitting: Endocrinology

## 2021-05-18 ENCOUNTER — Other Ambulatory Visit: Payer: Self-pay

## 2021-05-18 ENCOUNTER — Ambulatory Visit (INDEPENDENT_AMBULATORY_CARE_PROVIDER_SITE_OTHER): Payer: PPO | Admitting: Endocrinology

## 2021-05-18 VITALS — BP 140/70 | HR 70 | Ht 63.0 in | Wt 158.2 lb

## 2021-05-18 DIAGNOSIS — E7800 Pure hypercholesterolemia, unspecified: Secondary | ICD-10-CM

## 2021-05-18 DIAGNOSIS — E871 Hypo-osmolality and hyponatremia: Secondary | ICD-10-CM

## 2021-05-18 DIAGNOSIS — E038 Other specified hypothyroidism: Secondary | ICD-10-CM | POA: Diagnosis not present

## 2021-05-18 DIAGNOSIS — E1065 Type 1 diabetes mellitus with hyperglycemia: Secondary | ICD-10-CM

## 2021-05-18 MED ORDER — THYROID 90 MG PO TABS: 45.0000 mg | ORAL_TABLET | Freq: Every day | ORAL | 5 refills | Status: DC

## 2021-05-18 NOTE — Progress Notes (Signed)
Patient ID: Gina [name], female   DOB: 1946/09/26, 75 y.o.   MRN: [id number]      Reason for Appointment: Insulin Pump followup:   History of Present Illness   Diagnosis: Type 1 DIABETES MELITUS, date of diagnosis:  1984      HISTORY:   T-Slim PUMP SETTINGS are:  12 AM-2 AM = 0.3, 2 AM-4 AM = 0.45, 4 AM-8 AM = 1.10, 8 AM = 0.9, 11:30 AM = 1.0; 2 PM = 0. 90, 6 PM = 0.7,10 PM-12 AM = 0.3  Total basal insulin 19 units, recent total daily insulin about 29 units  Carbohydrate ratio 1: 8 at all meals Blood sugar target 100.  Correction factor 1: 50 Active insulin 4 hours  Current management, blood sugar patterns and problems identified:  A1c is 7.5 compared to 7.2   She has excellent control recently and not clear why her A1c is higher With reducing her 10 PM basal rate on her last visit she is not showing tendency to low sugars late at night As before she has periodic high readings after her meals with not covering her carbohydrates adequately She thinks that this is partly from getting fruits like watermelon and cantaloupe  She has fairly consistent high readings after breakfast but she does not bolus because there is no carbohydrate usually except rarely eating pancakes  However she drinks a significant amount of coffee in the morning and not bolusing for this  On average blood sugars are the highest around 8-9 AM after breakfast/coffee  Time in target is above 80% as before Again she is not very active because of hot weather She does use a sleep mode at night which is usually after 10 PM   CONTINUOUS GLUCOSE MONITORING RECORD INTERPRETATION    Glycemic patterns are as follows: She has some fluctuation in her blood sugars at all times with tendency to be higher after meals but variably so  No consistent pattern is seen overall and no hypoglycemia documented with lowest CGM reading 70  The average highest blood sugar is in the mornings and lowest overnight   Blood sugars are more frequently higher after her morning meal/coffee which is usually around 7-8 AM  Overnight blood sugars are very well controlled and only occasionally above 180 and generally not below normal, lowest reading may be around midnight  Premeal blood sugars are also fairly good at lunch and dinner   Dexcom data:    CGM use % of time 100  2-week average/SD 145  Time in range 83  % Time Above 180 17  % Time above 250   % Time Below 70       PRE-MEAL  overnight  mornings  afternoon  evening Overall  Glucose range:       Averages: 136 151 149 145    Previously   CGM use % of time   2-week average/SD  142/34  Time in range  86     %  % Time Above 180  14  % Time above 250   % Time Below 70 0      PRE-MEAL  overnight  mornings  afternoon  evening Overall  Glucose range:       Averages:  128  135  153  152        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, housework and other activities   Wt Readings from Last 3 Encounters:  05/18/21 158 lb 3.2 oz (71.8 kg)  03/21/21  156 lb 12.8 oz (71.1 kg)  02/14/21 157 lb 6.4 oz (71.4 kg)    LABS:   Lab Results  Component Value Date   HGBA1C 7.5 (H) 05/16/2021   HGBA1C 7.2 (H) 02/07/2021   HGBA1C 7.1 (H) 10/18/2020   Lab Results  Component Value Date   MICROALBUR <0.7 05/16/2021   LDLCALC 116 (H) 05/16/2021   CREATININE 0.85 05/16/2021    Other problems discussed today: Review of systems  Lab on 05/16/2021  Component Date Value Ref Range Status   T3, Free 05/16/2021 3.4  2.3 - 4.2 pg/mL Final   Free T4 05/16/2021 0.90  0.60 - 1.60 ng/dL Final   Comment: Specimens from patients who are undergoing biotin therapy and /or ingesting biotin supplements may contain high levels of biotin.  The higher biotin concentration in these specimens interferes with this Free T4 assay.  Specimens that contain high levels  of biotin may cause false high results for this Free T4 assay.  Please interpret results in light of the total  clinical presentation of the patient.     TSH 05/16/2021 2.67  0.35 - 5.50 uIU/mL Final   Cholesterol 05/16/2021 193  0 - 200 mg/dL Final   ATP III Classification       Desirable:  < 200 mg/dL               Borderline High:  200 - 239 mg/dL          High:  > = 240 mg/dL   Triglycerides 05/16/2021 65.0  0.0 - 149.0 mg/dL Final   Normal:  <150 mg/dLBorderline High:  150 - 199 mg/dL   HDL 05/16/2021 64.80  >39.00 mg/dL Final   VLDL 05/16/2021 13.0  0.0 - 40.0 mg/dL Final   LDL Cholesterol 05/16/2021 116 (A) 0 - 99 mg/dL Final   Total CHOL/HDL Ratio 05/16/2021 3   Final                  Men          Women1/2 Average Risk     3.4          3.3Average Risk          5.0          4.42X Average Risk          9.6          7.13X Average Risk          15.0          11.0                       NonHDL 05/16/2021 128.64   Final   NOTE:  Non-HDL goal should be 30 mg/dL higher than patient's LDL goal (i.e. LDL goal of < 70 mg/dL, would have non-HDL goal of < 100 mg/dL)   Microalb, Ur 05/16/2021 <0.7  0.0 - 1.9 mg/dL Final   Creatinine,U 05/16/2021 88.1  mg/dL Final   Microalb Creat Ratio 05/16/2021 0.8  0.0 - 30.0 mg/g Final   Sodium 05/16/2021 132 (A) 135 - 145 mEq/L Final   Potassium 05/16/2021 4.6  3.5 - 5.1 mEq/L Final   Chloride 05/16/2021 96  96 - 112 mEq/L Final   CO2 05/16/2021 29  19 - 32 mEq/L Final   Glucose, Bld 05/16/2021 202 (A) 70 - 99 mg/dL Final   BUN 05/16/2021 15  6 - 23 mg/dL Final   Creatinine, Ser 05/16/2021 0.85  0.40 -  1.20 mg/dL Final   Total Bilirubin 05/16/2021 0.6  0.2 - 1.2 mg/dL Final   Alkaline Phosphatase 05/16/2021 69  39 - 117 U/L Final   AST 05/16/2021 26  0 - 37 U/L Final   ALT 05/16/2021 24  0 - 35 U/L Final   Total Protein 05/16/2021 6.9  6.0 - 8.3 g/dL Final   Albumin 05/16/2021 4.0  3.5 - 5.2 g/dL Final   GFR 05/16/2021 66.33  >60.00 mL/min Final   Calculated using the CKD-EPI Creatinine Equation (2021)   Calcium 05/16/2021 9.0  8.4 - 10.5 mg/dL Final   Hgb A1c  MFr Bld 05/16/2021 7.5 (A) 4.6 - 6.5 % Final   Glycemic Control Guidelines for People with Diabetes:Non Diabetic:  <6%Goal of Therapy: <7%Additional Action Suggested:  >8%      Allergies as of 05/18/2021       Reactions   Rosuvastatin Calcium    Headaches, fatigue        Medication List        Accurate as of May 18, 2021  9:03 PM. If you have any questions, ask your nurse or doctor.          ASHWAGANDHA PO Take 1 tablet by mouth daily. $RemoveBefo'380mg'VbOFmCcsBPc$    CoQ10 200 MG Caps Take 200 mg by mouth daily.   Dexcom G6 Sensor Misc by Does not apply route.   Dexcom G6 Transmitter Misc by Does not apply route.   Eliquis 5 MG Tabs tablet Generic drug: apixaban TAKE 1 TABLET BY MOUTH TWICE A DAY   ezetimibe 10 MG tablet Commonly known as: Zetia Take 1 tablet (10 mg total) by mouth daily.   GLUCOSAMINE CHOND MSM FORMULA PO Take by mouth. Taking 1 TBSP daily   glucose blood test strip Commonly known as: ONE TOUCH ULTRA TEST Use asto check blood sugar 5 times a day   insulin aspart 100 UNIT/ML injection Commonly known as: NovoLOG USE MAX OF 60 UNITS DAILY VIA INSULIN PUMP.   losartan 25 MG tablet Commonly known as: COZAAR Take 1 tablet (25 mg total) by mouth daily.   MULTI VITAMIN PO Take 300 mg by mouth daily in the afternoon. Exipure- supports healthy weight loss-   MULTIVITAMIN PO Take by mouth. Iodoral- Taking one tablet by mouth daily- 12.$RemoveBeforeD'5mg'hOZPWYwqtJRaGA$    MULTIVITAMIN PO Take by mouth. Cholestar- Vitamin K27-  Taking 13mcg- one tablet by mouth daily   NON FORMULARY Systemic Formulas Pancreas supplement, 1 per day "                    "           Metabo-Shake (Glycemic Support) 1 scoop per day Reishi w/ Curcumin 1 per day   NON FORMULARY Systemic Formulas Glycemic Balance, 2 per day "                  "             Eyes supplement, 2 per day "                  "              ROX (Antioxidant w/ Reservatrol), 1 per day   NON FORMULARY Take 15 mLs by mouth daily. MAG CAL  VANILLA LIQUID   NON FORMULARY keraflex   POTASSIUM IODIDE PO Take by mouth.   RED YEAST RICE PO Take 1,200 mg by mouth daily in the afternoon.   RESVERATROL PO Take  960 mg by mouth daily.   SUPER OMEGA-3 PO Take by mouth. Takes 2 capsules per day   thyroid 15 MG tablet Commonly known as: ARMOUR Take 1 tablet daily with the 30 mg tablet (total of 45 mg daily)   thyroid 30 MG tablet Commonly known as: ARMOUR Take 1 tablet daily along with the 15 mg tablet for a total of 45 mg daily   Turmeric Curcumin 500 MG Caps Take 482 mg by mouth daily.   VITAMIN A PO Take by mouth. 10,500 IU- Take 1 tablet by mouth daily- For Eyes   Vitamin B-12 5000 MCG Subl Place 5,000 mcg under the tongue daily.   vitamin C 1000 MG tablet Take 1,000 mg by mouth daily.   VITAMIN D PO Take 5,000 mcg by mouth daily.        Allergies:  Allergies  Allergen Reactions   Rosuvastatin Calcium     Headaches, fatigue    Past Medical History:  Diagnosis Date   Anxiety    new dx   Atrial fibrillation ([place])    a. 03/2015 s/p RFCA;  b. CHA2DS2VASc = 5-->Eliquis.   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 04/6719 - due to AF RVR;  b. 03/2015 Echo: EF 60-65%, no rwma.   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 for Mets.: ER/PR  POs., Her 2 Neu negative, Ki-67 12%   COLONOSCOPY     1 polyp    ELECTROPHYSIOLOGIC STUDY N/A 04/08/2015   PVI + CTI ablation + posterior Box lesion + Atach ablation by [name]   OVARIAN CYST SURGERY     TEE WITHOUT CARDIOVERSION N/A 04/07/2015   Procedure: TRANSESOPHAGEAL ECHOCARDIOGRAM (TEE);  Surgeon: Thayer Headings, MD;  Location: [facility] ENDOSCOPY;  Service: Cardiovascular;  Laterality: N/A;   TONSILLECTOMY      Family History  Problem Relation Age of Onset   Breast cancer Mother 47   Heart failure Mother    Prostate cancer Maternal Uncle        died in his 83s   Heart attack Father    Diabetes Maternal Uncle 18    Social History:  reports that she has never smoked. She has never used smokeless tobacco. She reports that she does not drink alcohol and does not use drugs.  REVIEW of systems:     She has a history of  Hypothyroidism    She is now being managed here with Armour Thyroid which she prefers Because of mild increase in TSH earlier this year she was told to increase her dosage to 45 mg instead of 30 mg She gets tired at times and this is not any better with increasing her dose  TSH is however back to normal  Free T4 and T3 are quite normal  Lab Results  Component Value Date   TSH 2.67 05/16/2021   TSH 4.93 (H) 02/07/2021   TSH 4.70 (H) 10/18/2020   FREET4 0.90 05/16/2021   FREET4 0.94 02/07/2021   FREET4 0.94 10/18/2020   Lab Results  Component Value Date   T3FREE 3.4 05/16/2021   T3FREE 3.1 02/07/2021     HYPERCHOLESTEROLEMIA: Her LDL has been persistently high and refuses to take a statin drug including pravastatin even though it had no side effects previously. Previously her LDL particle number was high at 1621  She was given a prescription for Crestor which she tried for 3-4 weeks but did not continue because she felt it was giving her headaches and making her tired  She prefers to use natural remedies and she is taking red rice yeast and another  supplement with unknown constituents, LDL is stable with normal liver functions.   He has also continue taking Zetia as prescribed with improved control  Her LDL levels as below   Lab Results  Component Value Date   CHOL 193 05/16/2021   CHOL 199 02/07/2021   CHOL 262 (H) 10/18/2020   Lab Results  Component Value Date   HDL 64.80 05/16/2021   HDL 64.30 02/07/2021   HDL 68.10 10/18/2020   Lab Results  Component Value Date   LDLCALC 116 (H) 05/16/2021   LDLCALC 120 (H) 02/07/2021   LDLCALC 181 (H) 10/18/2020   Lab Results  Component Value Date   TRIG 65.0 05/16/2021   TRIG 74.0 02/07/2021   TRIG 67.0 10/18/2020   Lab Results  Component Value Date   CHOLHDL 3 05/16/2021   CHOLHDL 3 02/07/2021   CHOLHDL 4 10/18/2020   Lab Results  Component Value Date   LDLDIRECT 148.4 08/06/2013   .  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 been longstanding, not on any diuretics or SSRIs  Urine osmolality as of 04/2015 was 618 She was told to cut back on her fluid intake  Sodium is variable but generally between 129-132, has been as low as 126   Lab Results  Component Value Date   NA 132 (L) 05/16/2021   K 4.6 05/16/2021   CL 96 05/16/2021   CO2 29 05/16/2021   She refused the second COVID-vaccine  Also refuses the influenza vaccine   EXAM:  BP (!) 148/72   Pulse 70   Ht $R'5\' 3"'zZ$  (1.6 m)   Wt 158 lb 3.2 oz (71.8 kg)   SpO2 98%   BMI 28.02 kg/m     ASSESSMENT:  DIABETES type I on T-Slim pump  See history of present illness for detailed discussion of current diabetes management, blood sugar patterns and problems identified  Her A1c is fairly good at 7.5, previously was at 7.2  Recently her blood sugars are 83 % within target range with average 145 Has no recent hypoglycemia Considering her age and duration of diabetes her level of control is excellent with low fluctuation and standard deviation of only 33 The only pattern recently is higher readings after her morning coffee  when she does not take a bolus She thinks some of the high readings after lunch are because of eating more fruits that she does not color adequately Not doing much exercise except occasionally with her outside activities   RECOMMENDATIONS:  Basal rate patterns will be continued unchanged as also carbohydrate coverage for now She will bolus for at least 10 to 14 g of carbohydrate when she is  having coffee in the morning based on how much she is drinking  She will adjust the bolus based on her blood sugar pattern on CGM  She will increase her bolus at lunch by 1-2 units when eating more fruits like watermelon  Microalbumin normal  SIADH and hyponatremia: Now minimal with sodium 132 Previously diagnosed as idiopathic SIADH No causes identified She has been asked to restrict water   Hyperlipidemia: Consistent now with OTC products and Zetia, LDL 116   Hypothyroidism: TSH is back to normal at 45 mg of Armour Thyroid She is concerned about the cost of separate prescription for 30 and 15 mg and will see if the pharmacy can split her 90 mg tablets  Mild increase in systolic blood pressure: She needs to start checking blood pressure at home regularly and call her cardiologist if consistently around 140 or more  Follow-up in 4 months  Kalimah Capurro 05/18/2021, 9:03 PM

## 2021-06-15 DIAGNOSIS — E1065 Type 1 diabetes mellitus with hyperglycemia: Secondary | ICD-10-CM | POA: Diagnosis not present

## 2021-07-02 ENCOUNTER — Other Ambulatory Visit: Payer: Self-pay | Admitting: Internal Medicine

## 2021-08-02 DIAGNOSIS — E1065 Type 1 diabetes mellitus with hyperglycemia: Secondary | ICD-10-CM | POA: Diagnosis not present

## 2021-08-17 DIAGNOSIS — E1065 Type 1 diabetes mellitus with hyperglycemia: Secondary | ICD-10-CM | POA: Diagnosis not present

## 2021-08-21 DIAGNOSIS — Z794 Long term (current) use of insulin: Secondary | ICD-10-CM | POA: Diagnosis not present

## 2021-08-21 DIAGNOSIS — E119 Type 2 diabetes mellitus without complications: Secondary | ICD-10-CM | POA: Diagnosis not present

## 2021-08-21 DIAGNOSIS — B86 Scabies: Secondary | ICD-10-CM | POA: Diagnosis not present

## 2021-08-21 DIAGNOSIS — L209 Atopic dermatitis, unspecified: Secondary | ICD-10-CM | POA: Diagnosis not present

## 2021-08-22 ENCOUNTER — Telehealth: Payer: Self-pay | Admitting: Endocrinology

## 2021-08-22 NOTE — Telephone Encounter (Signed)
Patient talked into making a profile #2  as per Dr. Ronnie Derby settings.  She started this now.  We also discussed how/when to switch back to profile #1.  She had no final questions.

## 2021-08-22 NOTE — Telephone Encounter (Signed)
Patient requests to be called at ph# [phone] re: Patient is taking Prednisone for a few days and would like to know if she should change the dosage for basal rate on insulin pump. Patient states after the first dose of Prednisone (08/21/21) her blood sugars went very high (highest 299).

## 2021-08-26 DIAGNOSIS — R030 Elevated blood-pressure reading, without diagnosis of hypertension: Secondary | ICD-10-CM | POA: Diagnosis not present

## 2021-08-26 DIAGNOSIS — L237 Allergic contact dermatitis due to plants, except food: Secondary | ICD-10-CM | POA: Diagnosis not present

## 2021-08-26 DIAGNOSIS — E1065 Type 1 diabetes mellitus with hyperglycemia: Secondary | ICD-10-CM | POA: Diagnosis not present

## 2021-09-06 ENCOUNTER — Other Ambulatory Visit: Payer: Self-pay | Admitting: Endocrinology

## 2021-09-14 ENCOUNTER — Other Ambulatory Visit: Payer: Self-pay | Admitting: Internal Medicine

## 2021-09-14 NOTE — Telephone Encounter (Signed)
Eliquis 5mg  refill request received. Patient is 75 years old, weight-71.8kg, Crea-0.85 on 05/16/21, Diagnosis-Afib, and last seen by [name] on 03/21/2021. Dose is appropriate based on dosing criteria. Will send in refill to requested pharmacy.

## 2021-09-18 DIAGNOSIS — E1065 Type 1 diabetes mellitus with hyperglycemia: Secondary | ICD-10-CM | POA: Diagnosis not present

## 2021-09-19 ENCOUNTER — Other Ambulatory Visit (INDEPENDENT_AMBULATORY_CARE_PROVIDER_SITE_OTHER): Payer: PPO

## 2021-09-19 ENCOUNTER — Other Ambulatory Visit: Payer: Self-pay

## 2021-09-19 DIAGNOSIS — E038 Other specified hypothyroidism: Secondary | ICD-10-CM

## 2021-09-19 DIAGNOSIS — E1065 Type 1 diabetes mellitus with hyperglycemia: Secondary | ICD-10-CM | POA: Diagnosis not present

## 2021-09-19 LAB — BASIC METABOLIC PANEL
BUN: 9 mg/dL (ref 6–23)
CO2: 30 mEq/L (ref 19–32)
Calcium: 9.3 mg/dL (ref 8.4–10.5)
Chloride: 95 mEq/L — ABNORMAL LOW (ref 96–112)
Creatinine, Ser: 0.77 mg/dL (ref 0.40–1.20)
GFR: 74.5 mL/min (ref >60.00)
Glucose, Bld: 201 mg/dL — ABNORMAL HIGH (ref 70–99)
Potassium: 4.7 mEq/L (ref 3.5–5.1)
Sodium: 133 mEq/L — ABNORMAL LOW (ref 135–145)

## 2021-09-19 LAB — T4, FREE: Free T4: 1.05 ng/dL (ref 0.60–1.60)

## 2021-09-19 LAB — T3, FREE: T3, Free: 3.5 pg/mL (ref 2.3–4.2)

## 2021-09-19 LAB — TSH: TSH: 2.4 u[IU]/mL (ref 0.35–5.50)

## 2021-09-19 LAB — HEMOGLOBIN A1C: Hgb A1c MFr Bld: 7.4 % — ABNORMAL HIGH (ref 4.6–6.5)

## 2021-09-20 ENCOUNTER — Ambulatory Visit (HOSPITAL_COMMUNITY)
Admission: RE | Admit: 2021-09-20 | Discharge: 2021-09-20 | Disposition: A | Discharge status: Home / Self Care | Payer: PPO | Source: Ambulatory Visit | Attending: Nurse Practitioner | Admitting: Nurse Practitioner

## 2021-09-20 ENCOUNTER — Encounter (HOSPITAL_COMMUNITY): Payer: Self-pay | Admitting: Nurse Practitioner

## 2021-09-20 VITALS — BP 164/72 | HR 69 | Ht 63.0 in | Wt 156.6 lb

## 2021-09-20 DIAGNOSIS — D6869 Other thrombophilia: Secondary | ICD-10-CM

## 2021-09-20 DIAGNOSIS — Z7901 Long term (current) use of anticoagulants: Secondary | ICD-10-CM | POA: Diagnosis not present

## 2021-09-20 DIAGNOSIS — E119 Type 2 diabetes mellitus without complications: Secondary | ICD-10-CM | POA: Insufficient documentation

## 2021-09-20 DIAGNOSIS — I10 Essential (primary) hypertension: Secondary | ICD-10-CM | POA: Diagnosis not present

## 2021-09-20 DIAGNOSIS — I4819 Other persistent atrial fibrillation: Secondary | ICD-10-CM

## 2021-09-20 DIAGNOSIS — I4891 Unspecified atrial fibrillation: Secondary | ICD-10-CM | POA: Diagnosis not present

## 2021-09-20 NOTE — Progress Notes (Signed)
Primary Care Physician: Susy Frizzle, MD Referring Physician: Dr. Leonides Grills Mealing is a 75 y.o. female with a h/o paroxysmal  afib s/p ablation 04/08/15. She had done very well with no reoccurrence of  sustained afib.She is off antiarrythmic's and rate control for some time. She continue on eliquis without bleeding problems.   F/u in afib clinic, 09/20/21. EKG shows today NSR. She reports no awareness of afib and that she feels well. She does report that she had a syncopal spell while cooking this past Thanksgiving dinner. She was noted to have a BP of 88 systolic at the time. She may have been dehydrated and with prolonging standing, her BP dropped. She was able to complete cooking the meal after her husband helped her get up. BP returned to normal limits within a few minutes.  No heart rate issues. No neuro signs.  Had one other syncopal spell in 2019 but cannot remember specifics.   Today, she denies symptoms of palpitations, chest pain, shortness of breath, orthopnea, PND, lower extremity edema, dizziness, presyncope, syncope, or neurologic sequela. The patient is tolerating medications without difficulties and is otherwise without complaint today.   Past Medical History:  Diagnosis Date   Anxiety    new dx   Atrial fibrillation ([place])    a. 03/2015 s/p RFCA;  b. CHA2DS2VASc = 5-->Eliquis.   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 12/1495 - due to AF RVR;  b. 03/2015 Echo: EF 60-65%, no rwma.   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: 1/1 Node Positive for Mets.: ER/PR POs., Her 2 Neu negative, Ki-67 12%   COLONOSCOPY     1 polyp    ELECTROPHYSIOLOGIC STUDY N/A 04/08/2015   PVI + CTI ablation + posterior Box lesion + Atach ablation by [name]   OVARIAN CYST SURGERY     TEE WITHOUT CARDIOVERSION N/A 04/07/2015   Procedure: TRANSESOPHAGEAL ECHOCARDIOGRAM (TEE);  Surgeon: Thayer Headings, MD;  Location[facility] ENDOSCOPY;  Service: Cardiovascular;  Laterality: N/A;   TONSILLECTOMY      Current Outpatient Medications  Medication Sig Dispense Refill   apixaban (ELIQUIS) 5 MG TABS tablet TAKE 1 TABLET BY MOUTH TWICE A DAY 60 tablet 5   Ascorbic Acid (VITAMIN C) 1000 MG tablet Take 1,000 mg by mouth daily.      ASHWAGANDHA PO Take 1 tablet by mouth daily. 32m     Cholecalciferol (VITAMIN D PO) Take by mouth daily. Taking 1 drop by mouth daily- vitamin d3 plus k2     Coenzyme Q10 (COQ10) 200 MG CAPS Take 200 mg by mouth daily.     Continuous Blood Gluc Sensor (DEXCOM G6 SENSOR) MISC by Does not apply route.     Continuous Blood  Gluc Transmit (DEXCOM G6 TRANSMITTER) MISC by Does not apply route.     Cyanocobalamin (VITAMIN B-12) 5000 MCG SUBL Place 5,000 mcg under the tongue daily.     ezetimibe (ZETIA) 10 MG tablet Take 1 tablet (10 mg total) by mouth daily. 90 tablet 3   glucose blood (ONE TOUCH ULTRA TEST) test strip Use asto check blood sugar 5 times a day 200 each 5   losartan (COZAAR) 25 MG tablet TAKE 1 TABLET BY MOUTH EVERY DAY 90 tablet 0   Misc Natural Products (GLUCOSAMINE CHOND MSM FORMULA PO) Take by mouth. Taking 1 TBSP daily     Multiple Vitamin (MULTI VITAMIN PO) Take 300 mg by mouth daily in the afternoon. Exipure- supports healthy weight loss-     Multiple Vitamin (MULTIVITAMIN ADULT PO) Memo Max Pro-  Taking by mouth daily     Multiple Vitamin (MULTIVITAMIN ADULT PO) DIM 1026m- Taking one capsule by mouth twice daily- Estrogen supplement     Multiple Vitamin (MULTIVITAMIN PO) Take by mouth. Iodoral- Taking one tablet by mouth daily- 12.524m    Multiple Vitamin (MULTIVITAMIN PO) Take by mouth. Cholestar- Vitamin K27-  Taking 5033m one tablet by mouth daily     NON FORMULARY Systemic Formulas Pancreas supplement, 1 per day "                    "           Metabo-Shake (Glycemic Support) 1 scoop per day Reishi w/ Curcumin 1 per day     NON FORMULARY Systemic Formulas Glycemic Balance, 2 per day "                  "             Eyes supplement, 2 per day "                  "              ROX (Antioxidant w/ Reservatrol), 1 per day     NON FORMULARY Take 15 mLs by mouth daily. MAG CAL VANILLA LIQUID     NON FORMULARY Take 2 capsules by mouth every morning. Keraflex-     NOVOLOG 100 UNIT/ML injection USE MAX OF 60 UNITS DAILY VIA INSULIN PUMP. 50 mL 3   Omega-3 Fatty Acids (SUPER OMEGA-3 PO) Take by mouth. Takes 2 capsules per day     Red Yeast Rice Extract (RED YEAST RICE PO) Take 1,200 mg by mouth daily in the afternoon.     RESVERATROL PO Take 960 mg by mouth daily.     thyroid (ARMOUR THYROID) 90 MG tablet Take 0.5 tablets (45 mg total) by mouth daily. 15 tablet 5   thyroid (ARMOUR) 15 MG tablet Take 1 tablet daily with the 30 mg tablet (total of 45 mg daily) 30 tablet 11   thyroid (ARMOUR) 30 MG tablet Take 1 tablet daily along with the 15 mg tablet for a total of 45 mg daily 30 tablet 11   Turmeric Curcumin 500 MG CAPS Take 482 mg by mouth daily.     No current facility-administered medications for this encounter.   Facility-Administered Medications Ordered in Other Encounters  Medication Dose Route Frequency Provider Last Rate Last Admin   topical emolient (BIAFINE) emulsion   Topical Daily SquEppie GibsonD   Given at 05/28/12 1400    Allergies  Allergen Reactions   Rosuvastatin  Calcium  Headaches, fatigue    Social History   Socioeconomic History   Marital status: Married    Spouse name: Not on file   Number of children: Not on file   Years of education: Not on file   Highest education level: Not on file  Occupational History   Not on file  Tobacco Use   Smoking status: Never   Smokeless tobacco: Never  Substance and Sexual Activity   Alcohol use: No   Drug use: No   Sexual activity: Yes  Other Topics Concern   Not on file  Social History Narrative   Not on file   Social Determinants of Health   Financial Resource Strain: Not on file  Food Insecurity: Not on file  Transportation Needs: Not on file  Physical Activity: Not on file  Stress: Not on file  Social Connections: Not on file  Intimate Partner Violence: Not on file    Family History  Problem Relation Age of Onset   Breast cancer Mother 1   Heart failure Mother    Prostate cancer Maternal Uncle        died in his 65s   Heart attack Father    Diabetes Maternal Uncle 15    ROS- All systems are reviewed and negative except as per the HPI above  Physical Exam: Vitals:   09/20/21 1027  Weight: 71 kg  Height: _0  (1.6 m)   Wt Readings from Last 3 Encounters:  09/20/21 71 kg  05/18/21 71.8 kg  03/21/21 71.1 kg    Labs: Lab Results  Component Value Date   NA 133 (L) 09/19/2021   K 4.7 09/19/2021   CL 95 (L) 09/19/2021   CO2 30 09/19/2021   GLUCOSE 201 (H) 09/19/2021   BUN 9 09/19/2021   CREATININE 0.77 09/19/2021   CALCIUM 9.3 09/19/2021   MG 1.7 01/13/2015   Lab Results  Component Value Date   INR 0.98 01/11/2015   Lab Results  Component Value Date   CHOL 193 05/16/2021   HDL 64.80 05/16/2021   LDLCALC 116 (H) 05/16/2021   TRIG 65.0 05/16/2021     GEN- The patient is well appearing, alert and oriented x 3 today.   Head- normocephalic, atraumatic Eyes-  Sclera clear, conjunctiva pink Ears- hearing intact Oropharynx- clear Neck- supple, no  JVP Lymph- no cervical lymphadenopathy Lungs- Clear to ausculation bilaterally, normal work of breathing Heart- Regular rate and rhythm, no murmurs, rubs or gallops, PMI not laterally displaced GI- soft, NT, ND, + BS Extremities- no clubbing, cyanosis, or edema MS- no significant deformity or atrophy Skin- no rash or lesion Psych- euthymic mood, full affect Neuro- strength and sensation are intact  EKG- Vent. rate 69 BPM PR interval 160 ms QRS duration 78 ms QT/QTcB 380/407 ms P-R-T axes 55 75 41 Normal sinus rhythm Normal ECG   Assessment and Plan: 1. Afib Very quiet in the 6 years following ablation   2. CHA2DS2VASc score of at least 4 Eliquis appropriately dosed at 5 mg bid No bleeding issues  3. HTN Stable at home Elevate today, recheck 150/80  Recent drop in BP/dehydration with prolonged standing  may have been the issue with her syncopal spell Thanksgiving day.  If happens again, recommend going to the ER   4. DM Per pt controlled Per Dr. Dwyane Dee  F/u in [place] clinic in one year   Geroge Baseman. Carel Carrier, [facility] [address] [place], [place] [address] [phone]

## 2021-09-21 ENCOUNTER — Other Ambulatory Visit: Payer: Self-pay

## 2021-09-21 ENCOUNTER — Encounter: Payer: Self-pay | Admitting: Endocrinology

## 2021-09-21 ENCOUNTER — Ambulatory Visit (INDEPENDENT_AMBULATORY_CARE_PROVIDER_SITE_OTHER): Payer: PPO | Admitting: Endocrinology

## 2021-09-21 VITALS — BP 144/68 | HR 73 | Ht 63.0 in | Wt 156.2 lb

## 2021-09-21 DIAGNOSIS — E871 Hypo-osmolality and hyponatremia: Secondary | ICD-10-CM

## 2021-09-21 DIAGNOSIS — E038 Other specified hypothyroidism: Secondary | ICD-10-CM | POA: Diagnosis not present

## 2021-09-21 DIAGNOSIS — E7800 Pure hypercholesterolemia, unspecified: Secondary | ICD-10-CM

## 2021-09-21 DIAGNOSIS — E1065 Type 1 diabetes mellitus with hyperglycemia: Secondary | ICD-10-CM | POA: Diagnosis not present

## 2021-09-21 NOTE — Patient Instructions (Signed)
Bolus before meal and 50% more for hi fat

## 2021-09-21 NOTE — Progress Notes (Signed)
Patient ID: Gina [name], female   DOB: [birth year], 75 y.o.   MRN: [id number]     Reason for Appointment: Insulin Pump followup:   History of Present Illness   Diagnosis: Type 1 DIABETES MELITUS, date of diagnosis:  1984      HISTORY:   T-Slim PUMP SETTINGS are:  12 AM-2 AM = 0.3, 2 AM-4 AM = 0.45, 4 AM-8 AM = 1.10, 8 AM = 0.9, 11:30 AM = 1.0; 2 PM = 0. 90, 6 PM = 0.7,10 PM-12 AM = 0.3  Total basal insulin 19 units, recent total daily insulin about 29 units  Carbohydrate ratio 1: 8 at all meals Blood sugar target 100.  Correction factor 1: 50 Active insulin 4 hours  Current management, blood sugar patterns and problems identified:  A1c is 7.4   She had high sugars about a month ago when she got a couple of Dosepaks of prednisone for a rash  She thinks her blood sugars were over 300 and was given an alternate basal rate to program in her pump; she states she increased her basal rates further on those days on her own for better control  Overall still has excellent control except she tends to have frequently high readings in the afternoons  This is likely to be from inadequate bolusing or occasionally missed boluses for her lunch  Today she had a casserole with sweet potato and also some cheese and even though she took coverage for 20 g of carbohydrate for the upswing.  Her blood sugar went up to over 300 Also may not be getting enough correction for high readings when glucose goes up significantly, she does try to periodically take correction boluses BOLUS insulin breakdown: 78% of boluses are being overridden.  Using about 41% of insulin in the form of boluses She is using a sleep mode at night as before after 10 PM  With this her overnight blood sugars seem to be fairly stable No hypoglycemia now overnight  Also she thinks she has difficulty with her Dexcom sensor not being accurate in the last 2 to 3 days of the sensor life including earlier this week when it was  reading falsely low   CONTINUOUS GLUCOSE MONITORING RECORD INTERPRETATION    Glycemic patterns are as follows: She has relatively higher blood sugars starting around 9 AM and continuing until about 10 PM on average; blood sugars significantly are higher between about 2 PM-5 PM on most days, minimal hypoglycemia  HYPERGLYCEMIA appears to occur mostly in the early afternoons especially 3-4 PM The average is 225 at that time She also has the most fluctuation in her blood sugars in the afternoon although the last 3 days sensor may not be accurate HYPOGLYCEMIA is minimal with only occasional mild hypoglycemia early morning and midday but not in the last few days  POSTPRANDIAL hyperglycemia is present frequently after lunch but only occasionally has high readings going up after dinner in the first week  Overnight readings are generally fairly stable with some variability in the early part of the night    Dexcom data:    CGM use % of time 100  2-week average/SD 164/52  Time in range       69%  % Time Above 180 10  % Time above 200 20  % Time Below 70 1      PRE-MEAL  overnight  mornings  afternoon  evening Overall  Glucose range:       Averages: 147  146 184 181+/-60 160   Previously:   CGM use % of time 100  2-week average/SD 145  Time in range 83  % Time Above 180 17  % Time above 250   % Time Below 70       PRE-MEAL  overnight  mornings  afternoon  evening Overall  Glucose range:       Averages: 136 151 149 145      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, housework and other activities   Wt Readings from Last 3 Encounters:  09/21/21 156 lb 3.2 oz (70.9 kg)  09/20/21 156 lb 9.6 oz (71 kg)  05/18/21 158 lb 3.2 oz (71.8 kg)    LABS:   Lab Results  Component Value Date   HGBA1C 7.4 (H) 09/19/2021   HGBA1C 7.5 (H) 05/16/2021   HGBA1C 7.2 (H) 02/07/2021   Lab Results  Component Value Date   MICROALBUR <0.7 05/16/2021   LDLCALC 116 (H) 05/16/2021   CREATININE 0.77  09/19/2021    Other problems discussed today: Review of systems  Lab on 09/19/2021  Component Date Value Ref Range Status   T3, Free 09/19/2021 3.5  2.3 - 4.2 pg/mL Final   Free T4 09/19/2021 1.05  0.60 - 1.60 ng/dL Final   Comment: Specimens from patients who are undergoing biotin therapy and /or ingesting biotin supplements may contain high levels of biotin.  The higher biotin concentration in these specimens interferes with this Free T4 assay.  Specimens that contain high levels  of biotin may cause false high results for this Free T4 assay.  Please interpret results in light of the total clinical presentation of the patient.     TSH 09/19/2021 2.40  0.35 - 5.50 uIU/mL Final   Sodium 09/19/2021 133 (L)  135 - 145 mEq/L Final   Potassium 09/19/2021 4.7  3.5 - 5.1 mEq/L Final   Chloride 09/19/2021 95 (L)  96 - 112 mEq/L Final   CO2 09/19/2021 30  19 - 32 mEq/L Final   Glucose, Bld 09/19/2021 201 (H)  70 - 99 mg/dL Final   BUN 09/19/2021 9  6 - 23 mg/dL Final   Creatinine, Ser 09/19/2021 0.77  0.40 - 1.20 mg/dL Final   GFR 09/19/2021 74.50  >60.00 mL/min Final   Calculated using the CKD-EPI Creatinine Equation (2021)   Calcium 09/19/2021 9.3  8.4 - 10.5 mg/dL Final   Hgb A1c MFr Bld 09/19/2021 7.4 (H)  4.6 - 6.5 % Final   Glycemic Control Guidelines for People with Diabetes:Non Diabetic:  <6%Goal of Therapy: <7%Additional Action Suggested:  >8%      Allergies as of 09/21/2021       Reactions   Rosuvastatin Calcium    Headaches, fatigue        Medication List        Accurate as of September 21, 2021  1:14 PM. If you have any questions, ask your nurse or doctor.          ASHWAGANDHA PO Take 1 tablet by mouth daily. 374m   CoQ10 200 MG Caps Take 200 mg by mouth daily.   Dexcom G6 Sensor Misc by Does not apply route.   Dexcom G6 Transmitter Misc by Does not apply route.   Eliquis 5 MG Tabs tablet Generic drug: apixaban TAKE 1 TABLET BY MOUTH TWICE A DAY    ezetimibe 10 MG tablet Commonly known as: Zetia Take 1 tablet (10 mg total) by mouth daily.   GLUCOSAMINE CHOND MSM FORMULA  PO Take by mouth. Taking 1 TBSP daily   glucose blood test strip Commonly known as: ONE TOUCH ULTRA TEST Use asto check blood sugar 5 times a day   losartan 25 MG tablet Commonly known as: COZAAR TAKE 1 TABLET BY MOUTH EVERY DAY   MULTI VITAMIN PO Take 300 mg by mouth daily in the afternoon. Exipure- supports healthy weight loss-   MULTIVITAMIN PO Take by mouth. Iodoral- Taking one tablet by mouth daily- 12.78m   MULTIVITAMIN PO Take by mouth. Cholestar- Vitamin K27-  Taking 523m- one tablet by mouth daily   MULTIVITAMIN ADULT PO Memo Max Pro- Taking by mouth daily   MULTIVITAMIN ADULT PO DIM 100020mTaking one capsule by mouth twice daily- Estrogen supplement   NON FORMULARY Systemic Formulas Pancreas supplement, 1 per day "                    "           Metabo-Shake (Glycemic Support) 1 scoop per day Reishi w/ Curcumin 1 per day   NON FORMULARY Systemic Formulas Glycemic Balance, 2 per day "                  "             Eyes supplement, 2 per day "                  "              ROX (Antioxidant w/ Reservatrol), 1 per day   NON FORMULARY Take 15 mLs by mouth daily. MAG CAL VANILLA LIQUID   NON FORMULARY Take 2 capsules by mouth every morning. Keraflex-   NovoLOG 100 UNIT/ML injection Generic drug: insulin aspart USE MAX OF 60 UNITS DAILY VIA INSULIN PUMP.   RED YEAST RICE PO Take 1,200 mg by mouth daily in the afternoon.   RESVERATROL PO Take 960 mg by mouth daily.   SUPER OMEGA-3 PO Take by mouth. Takes 2 capsules per day   thyroid 15 MG tablet Commonly known as: ARMOUR Take 1 tablet daily with the 30 mg tablet (total of 45 mg daily)   thyroid 30 MG tablet Commonly known as: ARMOUR Take 1 tablet daily along with the 15 mg tablet for a total of 45 mg daily   thyroid 90 MG tablet Commonly known as: Armour Thyroid Take  0.5 tablets (45 mg total) by mouth daily.   Turmeric Curcumin 500 MG Caps Take 482 mg by mouth daily.   Vitamin B-12 5000 MCG Subl Place 5,000 mcg under the tongue daily.   vitamin C 1000 MG tablet Take 1,000 mg by mouth daily.   VITAMIN D PO Take by mouth daily. Taking 1 drop by mouth daily- vitamin d3 plus k2        Allergies:  Allergies  Allergen Reactions   Rosuvastatin Calcium     Headaches, fatigue    Past Medical History:  Diagnosis Date   Anxiety    new dx   [place]  a. 03/2015 s/p RFCA;  b. CHA2DS2VASc = 5-->Eliquis.   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 3/23/2202due to AF RVR;  b. 03/2015 Echo: EF 60-65%, no rwma.   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: 1/1 Node Positive for Mets.: ER/PR POs., Her 2 Neu negative, Ki-67 12%   COLONOSCOPY     1 polyp    ELECTROPHYSIOLOGIC STUDY N/A 04/08/2015   PVI + CTI ablation + posterior Box lesion + Atach ablation by [name]   OVARIAN CYST SURGERY     TEE WITHOUT CARDIOVERSION N/A 04/07/2015   Procedure: TRANSESOPHAGEAL ECHOCARDIOGRAM (TEE);  Surgeon: Thayer Headings, MD;  Location: [facility] ENDOSCOPY;  Service: Cardiovascular;  Laterality: N/A;   TONSILLECTOMY      Family History  Problem Relation Age of Onset   Breast cancer Mother 29   Heart  failure Mother    Prostate cancer Maternal Uncle        died in his 46s   Heart attack Father    Diabetes Maternal Uncle 96    Social History:  reports that she has never smoked. She has never used smokeless tobacco. She reports that she does not drink alcohol and does not use drugs.  REVIEW of systems:     She has a history of  Hypothyroidism    She is now being managed here with Armour Thyroid which she prefers Because of mild increase in TSH earlier this year she was told to increase her dosage to 45 mg instead of 30 mg  TSH has been subsequently normal  Free T4 and T3 are quite normal  Lab Results  Component Value Date   TSH 2.40 09/19/2021   TSH 2.67 05/16/2021   TSH 4.93 (H) 02/07/2021   FREET4 1.05 09/19/2021   FREET4 0.90 05/16/2021   FREET4 0.94 02/07/2021   Lab Results  Component Value Date   T3FREE 3.5 09/19/2021   T3FREE 3.4 05/16/2021     HYPERCHOLESTEROLEMIA: Her LDL has been persistently high and refuses to take a statin drug including pravastatin even though it had no side effects previously. Previously her LDL particle number was high at 1621  She was given a prescription for Crestor which she tried for 3-4 weeks but did not continue because she felt it was giving her headaches and making her tired  She prefers to use natural remedies and she is taking red rice yeast and another supplement with unknown constituents, LDL is stable with normal liver functions.     Also taking Zetia as prescribed with improved control since 1/22  Her LDL levels as below   Lab Results  Component Value Date   CHOL 193 05/16/2021   CHOL 199 02/07/2021   CHOL 262 (H) 10/18/2020   Lab Results  Component Value Date   HDL 64.80 05/16/2021   HDL 64.30 02/07/2021   HDL 68.10 10/18/2020   Lab Results  Component Value Date   LDLCALC 116 (H) 05/16/2021   LDLCALC 120 (H) 02/07/2021   LDLCALC 181 (H) 10/18/2020   Lab Results  Component Value Date   TRIG 65.0  05/16/2021   TRIG 74.0 02/07/2021   TRIG 67.0 10/18/2020  Lab Results  Component Value Date   CHOLHDL 3 05/16/2021   CHOLHDL 3 02/07/2021   CHOLHDL 4 10/18/2020   Lab Results  Component Value Date   LDLDIRECT 148.4 08/06/2013   .  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 been longstanding, not on any diuretics or SSRIs  Urine osmolality as of 04/2015 was 618 She was told to cut back on her fluid intake  Sodium is variable but generally between 129-133 7, has been as low as 126   Lab Results  Component Value Date   NA 133 (L) 09/19/2021   K 4.7 09/19/2021   CL 95 (L) 09/19/2021   CO2 30 09/19/2021   She refused the second COVID-vaccine  Also refuses the influenza vaccine   EXAM:  BP (!) 144/68 (BP Location: Right Arm, Patient Position: Sitting, Cuff Size: Normal)   Pulse 73   Ht _0  (1.6 m)   Wt 156 lb 3.2 oz (70.9 kg)   SpO2 97%   BMI 27.67 kg/m     ASSESSMENT:  DIABETES type I on T-Slim pump  See history of present illness for detailed discussion of current diabetes management, blood sugar patterns and problems identified  Her A1c is fairly good at 7.4  Although she has had difficulties with hyperglycemia the steroids about a month ago her A1c is still not any higher than before However she appears to have fairly consistently high readings after lunch or in the afternoon This is likely to be from inadequate coverage of her meals especially if she is eating a high-fat meal such as today Also likely needs higher basal rate and bolus calculation for lunch Also may benefit from better correction factor Not clear why she is overriding her boluses frequently but not by a great extent  Recently her blood sugars are only 69 % within target range However has avoided hypoglycemia She is still not comfortable changing her basal settings on her own Although she is wishing to change her pump to another brand because of issues with the Dexcom unlikely she can benefit from  changing  RECOMMENDATIONS:  Basal rate changes: At 2 PM she will use 1.05 Lunchtime coverage will be 1: 7 carbohydrate and correction factor I: 40 Unable to change her active insulin time with her control IQ  SIADH and hyponatremia: Now minimal with sodium 133   Hyperlipidemia: Recommended continuing Zetia and follow-up lipids on the next visit 16   Hypothyroidism: TSH is again normal at 45 mg of Armour Thyroid    Mild increase in systolic blood pressure: Followed by cardiology   Follow-up in 4 months  Rayana Geurin 09/21/2021, 1:14 PM

## 2021-09-30 ENCOUNTER — Other Ambulatory Visit: Payer: Self-pay | Admitting: Internal Medicine

## 2021-10-02 NOTE — Telephone Encounter (Signed)
This is a A-Fib clinic pt 

## 2021-10-14 ENCOUNTER — Other Ambulatory Visit: Payer: Self-pay | Admitting: Endocrinology

## 2021-10-19 DIAGNOSIS — E1065 Type 1 diabetes mellitus with hyperglycemia: Secondary | ICD-10-CM | POA: Diagnosis not present

## 2021-10-31 DIAGNOSIS — E1065 Type 1 diabetes mellitus with hyperglycemia: Secondary | ICD-10-CM | POA: Diagnosis not present

## 2021-11-21 DIAGNOSIS — E1065 Type 1 diabetes mellitus with hyperglycemia: Secondary | ICD-10-CM | POA: Diagnosis not present

## 2022-01-03 ENCOUNTER — Other Ambulatory Visit: Payer: Self-pay | Admitting: Endocrinology

## 2022-01-22 ENCOUNTER — Other Ambulatory Visit (INDEPENDENT_AMBULATORY_CARE_PROVIDER_SITE_OTHER): Payer: PPO

## 2022-01-22 DIAGNOSIS — E1065 Type 1 diabetes mellitus with hyperglycemia: Secondary | ICD-10-CM | POA: Diagnosis not present

## 2022-01-22 DIAGNOSIS — E038 Other specified hypothyroidism: Secondary | ICD-10-CM | POA: Diagnosis not present

## 2022-01-22 DIAGNOSIS — E7800 Pure hypercholesterolemia, unspecified: Secondary | ICD-10-CM

## 2022-01-22 LAB — LIPID PANEL
Cholesterol: 185 mg/dL (ref 0–200)
HDL: 73.8 mg/dL (ref >39.00)
LDL Cholesterol: 99 mg/dL (ref 0–99)
NonHDL: 111.61
Total CHOL/HDL Ratio: 3
Triglycerides: 65 mg/dL (ref 0.0–149.0)
VLDL: 13 mg/dL (ref 0.0–40.0)

## 2022-01-22 LAB — COMPREHENSIVE METABOLIC PANEL
ALT: 16 U/L (ref 0–35)
AST: 19 U/L (ref 0–37)
Albumin: 4.2 g/dL (ref 3.5–5.2)
Alkaline Phosphatase: 69 U/L (ref 39–117)
BUN: 10 mg/dL (ref 6–23)
CO2: 29 mEq/L (ref 19–32)
Calcium: 9.2 mg/dL (ref 8.4–10.5)
Chloride: 96 mEq/L (ref 96–112)
Creatinine, Ser: 0.75 mg/dL (ref 0.40–1.20)
GFR: 76.71 mL/min (ref >60.00)
Glucose, Bld: 123 mg/dL — ABNORMAL HIGH (ref 70–99)
Potassium: 4.5 mEq/L (ref 3.5–5.1)
Sodium: 132 mEq/L — ABNORMAL LOW (ref 135–145)
Total Bilirubin: 0.5 mg/dL (ref 0.2–1.2)
Total Protein: 6.4 g/dL (ref 6.0–8.3)

## 2022-01-22 LAB — TSH: TSH: 3.02 u[IU]/mL (ref 0.35–5.50)

## 2022-01-22 LAB — HEMOGLOBIN A1C: Hgb A1c MFr Bld: 7.2 % — ABNORMAL HIGH (ref 4.6–6.5)

## 2022-01-25 ENCOUNTER — Ambulatory Visit: Payer: PPO | Admitting: Endocrinology

## 2022-01-25 ENCOUNTER — Ambulatory Visit (INDEPENDENT_AMBULATORY_CARE_PROVIDER_SITE_OTHER): Payer: PPO | Admitting: Endocrinology

## 2022-01-25 VITALS — BP 144/72 | HR 71 | Ht 63.0 in | Wt 150.0 lb

## 2022-01-25 DIAGNOSIS — E038 Other specified hypothyroidism: Secondary | ICD-10-CM

## 2022-01-25 DIAGNOSIS — E871 Hypo-osmolality and hyponatremia: Secondary | ICD-10-CM | POA: Diagnosis not present

## 2022-01-25 DIAGNOSIS — E1065 Type 1 diabetes mellitus with hyperglycemia: Secondary | ICD-10-CM

## 2022-01-25 DIAGNOSIS — E7800 Pure hypercholesterolemia, unspecified: Secondary | ICD-10-CM

## 2022-01-25 NOTE — Progress Notes (Signed)
? ? ?Patient ID: Gina [name], female   DOB: [birth year], 76 y.o.   MRN: [id number] ? ? ? ? ?Reason for Appointment: Insulin Pump followup:  ? ?History of Present Illness  ? ?Diagnosis: Type 1 DIABETES MELITUS, date of diagnosis:  1984    ? ? ?HISTORY: ? ?T-Slim PUMP SETTINGS are: ? ?12 AM-2 AM = 0.3, 2 AM-4 AM = 0.45, 4 AM- 6 AM = 1.1, 6 AM-8 AM = 1.0, 8 AM = 0.9, 11:30 AM = 1.0; 2 PM = 0. 90, 6 PM = 0.7,10 PM-12 AM = 0.3 ? ?Total basal insulin 19 units, recent total daily insulin about 27 units ? ?Carbohydrate ratio 1: 8 except 1: 7 from 2-6 PM ? ?Blood sugar target 100.  Correction factor 1: 50 except 1: 40 2 PM-10 PM ?Active insulin 4 hours ? ?Current management, blood sugar patterns and problems identified: ? ?A1c is 7.2 compared to 7.4 ? ? ?She overall has had better control and in the last 2 weeks her time in range was 89%  ?As before she is usually eating small amounts of carbohydrate with total daily carbs entered in her pump is only 27 mg/day  ?As before she may sometimes not take a bolus causing her sugar to be higher especially at lunch or dinner, at least once her sugar went higher from the late evening snack  ?As before her blood sugars may tend to be lower early morning before she wakes up without documented hypoglycemia  ?Yesterday appears that her Dexcom may have been showing some artifacts with rapid change in blood sugar values and lost connection late evening, usually does not find this to be as accurate in the last day or 2 before she changes the sensor ?Overall she is trying to cut back on her fluid intake and is losing weight ?Also is starting to be more active with outside activities and gardening ?Only rarely will have issues with her infusion set not functioning properly ?She continues to use a sleep mode overnight ? ? ?CONTINUOUS GLUCOSE MONITORING RECORD INTERPRETATION   ? ?Glycemic patterns are as follows:  ? ?Average blood sugars are in the target range at all times  ?HIGHEST  average blood sugar around 8-9 PM  ?Lowest blood sugar overall around 6 AM ?HYPERGLYCEMIC episodes are noticed periodically mid afternoon or evening after 7 PM  ?Postprandial blood sugars are in on an average fairly well controlled but periodically will go up over 180 or 200 after lunch and dinner  ?No significant hypoglycemia with possible artifacts indicating low sugars yesterday twice ?Overnight readings are overall fairly good with stable readings early morning and low normal readings around 6-7 AM ? ? ? ?Dexcom data: ? ?   ?CGM use % of time   ?2-week average/SD 138+/-132  ?Time in range    89%  ?% Time Above 180 10  ?% Time above 250   ?% Time Below 70 1  ? ?  ? ?PRE-MEAL  overnight  mornings  afternoon  evening Overall  ?Glucose range:       ?Averages: 129 129 137 146+/-41   ?Previously ? ? ?CGM use % of time 100  ?2-week average/SD 164/52  ?Time in range       69%  ?% Time Above 180 10  ?% Time above 200 20  ?% Time Below 70 1  ? ?  ? ?PRE-MEAL  overnight  mornings  afternoon  evening Overall  ?Glucose range:       ?  Averages: 147 146 184 181+/-60 160  ? ? 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 ? ?Wt Readings from Last 3 Encounters:  ?01/25/22 150 lb (68 kg)  ?09/21/21 156 lb 3.2 oz (70.9 kg)  ?09/20/21 156 lb 9.6 oz (71 kg)  ? ? ?LABS:  ? ?Lab Results  ?Component Value Date  ? HGBA1C 7.2 (H) 01/22/2022  ? HGBA1C 7.4 (H) 09/19/2021  ? HGBA1C 7.5 (H) 05/16/2021  ? ?Lab Results  ?Component Value Date  ? MICROALBUR <0.7 05/16/2021  ? [place] 99 01/22/2022  ? CREATININE 0.75 01/22/2022  ? ? ?Other problems discussed today: Review of systems ? ?Lab on 01/22/2022  ?Component Date Value Ref Range Status  ? TSH 01/22/2022 3.02  0.35 - 5.50 uIU/mL Final  ? Cholesterol 01/22/2022 185  0 - 200 mg/dL Final  ? ATP III Classification       Desirable:  < 200 mg/dL               Borderline High:  200 - 239 mg/dL          High:  > = 240 mg/dL  ? Triglycerides 01/22/2022 65.0  0.0 - 149.0 mg/dL Final   ? Normal:  <150 mg/dLBorderline High:  150 - 199 mg/dL  ? HDL 01/22/2022 73.80  >39.00 mg/dL Final  ? VLDL 01/22/2022 13.0  0.0 - 40.0 mg/dL Final  ? LDL Cholesterol 01/22/2022 99  0 - 99 mg/dL Final  ? Total CHOL/HDL Ratio 01/22/2022 3   Final  ?                Men          Women1/2 Average Risk     3.4          3.3Average Risk          5.0          4.42X Average Risk          9.6          7.13X Average Risk          15.0          11.0                      ? NonHDL 01/22/2022 111.61   Final  ? NOTE:  Non-HDL goal should be 30 mg/dL higher than patient's LDL goal (i.e. LDL goal of < 70 mg/dL, would have non-HDL goal of < 100 mg/dL)  ? Sodium 01/22/2022 132 (L)  135 - 145 mEq/L Final  ? Potassium 01/22/2022 4.5  3.5 - 5.1 mEq/L Final  ? Chloride 01/22/2022 96  96 - 112 mEq/L Final  ? CO2 01/22/2022 29  19 - 32 mEq/L Final  ? Glucose, Bld 01/22/2022 123 (H)  70 - 99 mg/dL Final  ? BUN 01/22/2022 10  6 - 23 mg/dL Final  ? Creatinine, Ser 01/22/2022 0.75  0.40 - 1.20 mg/dL Final  ? Total Bilirubin 01/22/2022 0.5  0.2 - 1.2 mg/dL Final  ? Alkaline Phosphatase 01/22/2022 69  39 - 117 U/L Final  ? AST 01/22/2022 19  0 - 37 U/L Final  ? ALT 01/22/2022 16  0 - 35 U/L Final  ? Total Protein 01/22/2022 6.4  6.0 - 8.3 g/dL Final  ? Albumin 01/22/2022 4.2  3.5 - 5.2 g/dL Final  ? GFR 01/22/2022 76.71  >60.00 mL/min Final  ? Calculated using the CKD-EPI Creatinine Equation (2021)  ?  Calcium 01/22/2022 9.2  8.4 - 10.5 mg/dL Final  ? Hgb A1c MFr Bld 01/22/2022 7.2 (H)  4.6 - 6.5 % Final  ? Glycemic Control Guidelines for People with Diabetes:Non Diabetic:  <6%Goal of Therapy: <7%Additional Action Suggested:  >8%   ? ? ? ?Allergies as of 01/25/2022   ? ?   Reactions  ? Rosuvastatin Calcium   ? Headaches, fatigue  ? ?  ? ?  ?Medication List  ?  ? ?  ? Accurate as of January 25, 2022 10:22 AM. If you have any questions, ask your nurse or doctor.  ?  ?  ? ?  ? ?ASHWAGANDHA PO ?Take 1 tablet by mouth daily. '380mg'$  ?  ?CoQ10 200 MG  Caps ?Take 200 mg by mouth daily. ?  ?Dexcom G6 Sensor Misc ?by Does not apply route. ?  ?Dexcom G6 Transmitter Misc ?by Does not apply route. ?  ?Eliquis 5 MG Tabs tablet ?Generic drug: apixaban ?TAKE 1 TABLET BY MOUTH TWICE A DAY ?  ?ezetimibe 10 MG tablet ?Commonly known as: ZETIA ?TAKE 1 TABLET BY MOUTH EVERY DAY ?  ?GLUCOSAMINE CHOND MSM FORMULA PO ?Take by mouth. Taking 1 TBSP daily ?  ?glucose blood test strip ?Commonly known as: ONE TOUCH ULTRA TEST ?Use asto check blood sugar 5 times a day ?  ?losartan 25 MG tablet ?Commonly known as: COZAAR ?TAKE 1 TABLET BY MOUTH EVERY DAY ?  ?MULTI VITAMIN PO ?Take 300 mg by mouth daily in the afternoon. Exipure- supports healthy weight loss- ?  ?MULTIVITAMIN PO ?Take by mouth. Iodoral- Taking one tablet by mouth daily- 12.'5mg'$  ?  ?MULTIVITAMIN PO ?Take by mouth. Cholestar- Vitamin K27-  ?Taking 55mg- one tablet by mouth daily ?  ?MULTIVITAMIN ADULT PO ?Memo Max Pro- Taking by mouth daily ?  ?MULTIVITAMIN ADULT PO ?DIM '1000mg'$ - Taking one capsule by mouth twice daily- Estrogen supplement ?  ?NON FORMULARY ?Systemic Formulas Pancreas supplement, 1 per day ?"                    "           Metabo-Shake (Glycemic Support) 1 scoop per day ?Reishi w/ Curcumin 1 per day ?  ?NON FORMULARY ?Systemic Formulas Glycemic Balance, 2 per day ?"                  "             Eyes supplement, 2 per day ?"                  "              ROX (Antioxidant w/ Reservatrol), 1 per day ?  ?NON FORMULARY ?Take 15 mLs by mouth daily. MAG CAL VANILLA LIQUID ?  ?NON FORMULARY ?Take 2 capsules by mouth every morning. Keraflex- ?  ?NovoLOG 100 UNIT/ML injection ?Generic drug: insulin aspart ?USE MAX OF 60 UNITS DAILY VIA INSULIN PUMP. ?  ?RED YEAST RICE PO ?Take 1,200 mg by mouth daily in the afternoon. ?  ?RESVERATROL PO ?Take 960 mg by mouth daily. ?  ?SUPER OMEGA-3 PO ?Take by mouth. Takes 2 capsules per day ?  ?thyroid 30 MG tablet ?Commonly known as: ARMOUR ?Take 1 tablet daily along with the  15 mg tablet for a total of 45 mg daily ?  ?thyroid 90 MG tablet ?Commonly known as: Armour Thyroid ?Take 0.5 tablets (45 mg total) by mouth daily. ?  ?thyroid 15 MG  tablet ?Commonly known as: Armour Thyroid ?TA

## 2022-01-30 DIAGNOSIS — E1065 Type 1 diabetes mellitus with hyperglycemia: Secondary | ICD-10-CM | POA: Diagnosis not present

## 2022-02-01 ENCOUNTER — Ambulatory Visit (HOSPITAL_COMMUNITY)
Admission: RE | Admit: 2022-02-01 | Discharge: 2022-02-01 | Disposition: A | Discharge status: Home / Self Care | Payer: PPO | Source: Ambulatory Visit | Attending: Nurse Practitioner | Admitting: Nurse Practitioner

## 2022-02-01 ENCOUNTER — Encounter (HOSPITAL_COMMUNITY): Payer: Self-pay | Admitting: Nurse Practitioner

## 2022-02-01 VITALS — BP 174/76 | HR 64 | Ht 63.0 in | Wt 156.2 lb

## 2022-02-01 DIAGNOSIS — E119 Type 2 diabetes mellitus without complications: Secondary | ICD-10-CM | POA: Diagnosis not present

## 2022-02-01 DIAGNOSIS — I4891 Unspecified atrial fibrillation: Secondary | ICD-10-CM | POA: Insufficient documentation

## 2022-02-01 DIAGNOSIS — I10 Essential (primary) hypertension: Secondary | ICD-10-CM | POA: Diagnosis not present

## 2022-02-01 DIAGNOSIS — Z7901 Long term (current) use of anticoagulants: Secondary | ICD-10-CM | POA: Diagnosis not present

## 2022-02-01 DIAGNOSIS — D6869 Other thrombophilia: Secondary | ICD-10-CM | POA: Diagnosis not present

## 2022-02-01 MED ORDER — DILTIAZEM HCL 30 MG PO TABS: ORAL_TABLET | ORAL | 1 refills | Status: DC

## 2022-02-01 NOTE — Progress Notes (Signed)
? ?Primary Care Physician: Susy Frizzle, MD ?Referring Physician: Dr. Rayann Heman ? ? ?Gina [name] is a 76 y.o. female with a h/o paroxysmal  afib s/p ablation 04/08/15. She had done very well with no reoccurrence of  sustained afib.She is off antiarrythmic's and rate control for some time. She continue on eliquis without bleeding problems.  ? ?F/u in afib clinic, 09/20/21. EKG shows today NSR. She reports no awareness of afib and that she feels well. She does report that she had a syncopal spell while cooking this past Thanksgiving dinner. She was noted to have a BP of 88 systolic at the time. She may have been dehydrated and with prolonging standing, her BP dropped. She was able to complete cooking the meal after her husband helped her get up. BP returned to normal limits within a few minutes.  No heart rate issues. No neuro signs.  Had one other syncopal spell in 2019 but cannot remember specifics.  ? ?F/u in afib clinic 02/01/22. She is here today as  she felt she may be back in afib. EKG today shows SR. She states that the morning of 4/12, shortly after she got up, she vomited and flet weak. She then started checking her V/S and her BP was low  and heart rate was irregular but not elevated. She improved during the day but felt  weak, she has not had any other spells.  ? ?Today, she denies symptoms of palpitations, chest pain, shortness of breath, orthopnea, PND, lower extremity edema, dizziness, presyncope, syncope, or neurologic sequela. The patient is tolerating medications without difficulties and is otherwise without complaint today.  ? ?Past Medical History:  ?Diagnosis Date  ? Anxiety   ? new dx  ? Atrial fibrillation ([place])   ? a. 03/2015 s/p RFCA;  b. CHA2DS2VASc = 5-->Eliquis.  ? Breast cancer ([place]) 03/05/12  ? 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 Diabetes mellitus type 1 ([place])   ? Insulin pump  ? Diastolic CHF  ([place])   ? a. 12/2014 - due to AF RVR;  b. 03/2015 Echo: EF 60-65%, no rwma.  ? Hyperlipidemia   ? Hypertension   ? Hypothyroidism   ? Insulin pump in place   ? Ovarian cyst   ? S/P radiation therapy 04/10/12- 05/26/2012  ? left Breast and Axilla / 50 gy / 25 Fractions with Left Breast Boost / 10 Gy / 5 Fractions  ? SIADH (syndrome of inappropriate ADH production) ([place])   ? Use of letrozole ([place]) 06/13/12  ? ?Past Surgical History:  ?Procedure Laterality Date  ? APPENDECTOMY    ? BREAST LUMPECTOMY Left 03/05/2012  ? 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 COLONOSCOPY    ? 1 polyp   ? ELECTROPHYSIOLOGIC STUDY N/A 04/08/2015  ? PVI + CTI ablation + posterior Box lesion + Atach ablation by [name]  ? OVARIAN CYST SURGERY    ? TEE WITHOUT CARDIOVERSION N/A 04/07/2015  ? Procedure: TRANSESOPHAGEAL ECHOCARDIOGRAM (TEE);  Surgeon: Thayer Headings, MD;  Location: [facility];  Service: Cardiovascular;  Laterality: N/A;  ? TONSILLECTOMY    ? ? ?Current Outpatient Medications  ?Medication Sig Dispense Refill  ? apixaban (ELIQUIS) 5 MG TABS tablet TAKE 1 TABLET BY MOUTH TWICE A DAY 60 tablet 5  ? Ascorbic Acid (VITAMIN C) 1000 MG tablet Take 1,000 mg  by mouth daily.     ? ASHWAGANDHA PO Take 1 tablet by mouth daily. 393m    ? Cholecalciferol (VITAMIN D PO) Take by mouth daily. Taking 1 drop by mouth daily- vitamin d3 plus k2    ? Coenzyme Q10 (COQ10) 200 MG CAPS Take 200 mg by mouth daily.    ? Continuous Blood Gluc Sensor (DEXCOM G6 SENSOR) MISC by Does not apply route.    ? Continuous Blood Gluc Transmit (DEXCOM G6 TRANSMITTER) MISC by Does not apply route.    ? Cyanocobalamin (VITAMIN B-12) 5000 MCG SUBL Place 5,000 mcg under the tongue daily.    ? ezetimibe (ZETIA) 10 MG tablet TAKE 1 TABLET BY MOUTH EVERY DAY 90 tablet 3  ? glucose blood (ONE TOUCH ULTRA TEST) test strip Use asto check blood sugar 5 times a day 200  each 5  ? losartan (COZAAR) 25 MG tablet TAKE 1 TABLET BY MOUTH EVERY DAY 90 tablet 3  ? Misc Natural Products (GLUCOSAMINE CHOND MSM FORMULA PO) Take by mouth. Taking 1 TBSP daily    ? Multiple Vitamin (MULTI VITAMIN PO) Take 300 mg by mouth daily in the afternoon. Exipure- supports healthy weight loss-    ? Multiple Vitamin (MULTIVITAMIN ADULT PO) Memo Max Pro- Taking by mouth daily    ? Multiple Vitamin (MULTIVITAMIN ADULT PO) DIM 10029m Taking one capsule by mouth twice daily- Estrogen supplement    ? Multiple Vitamin (MULTIVITAMIN PO) Take by mouth. Iodoral- Taking one tablet by mouth daily- 12.48m69m  ? Multiple Vitamin (MULTIVITAMIN PO) Take by mouth. Cholestar- Vitamin K27-  ?Taking 67m12mone tablet by mouth daily    ? NON FORMULARY Systemic Formulas Pancreas supplement, 1 per day ?"                    "           Metabo-Shake (Glycemic Support) 1 scoop per day ?Reishi w/ Curcumin 1 per day    ? NON FORMULARY Systemic Formulas Glycemic Balance, 2 per day ?"                  "             Eyes supplement, 2 per day ?"                  "              ROX (Antioxidant w/ Reservatrol), 1 per day    ? NON FORMULARY Take 15 mLs by mouth daily. MAG CAL VANILLA LIQUID    ? NON FORMULARY Take 2 capsules by mouth every morning. Keraflex-    ? NOVOLOG 100 UNIT/ML injection USE MAX OF 60 UNITS DAILY VIA INSULIN PUMP. 50 mL 3  ? Omega-3 Fatty Acids (SUPER OMEGA-3 PO) Take by mouth. Takes 2 capsules per day    ? Red Yeast Rice Extract (RED YEAST RICE PO) Take 1,200 mg by mouth daily in the afternoon.    ? RESVERATROL PO Take 960 mg by mouth daily.    ? thyroid (ARMOUR THYROID) 15 MG tablet TAKE 1 TABLET DAILY WITH THE 30 MG TABLET (TOTAL OF 45 MG DAILY) 90 tablet 3  ? thyroid (ARMOUR THYROID) 90 MG tablet Take 0.5 tablets (45 mg total) by mouth daily. 15 tablet 5  ? thyroid (ARMOUR) 30 MG tablet Take 1 tablet daily along with the 15 mg tablet for a total of 45 mg daily 30 tablet 11  ?  Turmeric Curcumin 500 MG CAPS Take  482 mg by mouth daily.    ? ?No current facility-administered medications for this encounter.  ? ?Facility-Administered Medications Ordered in Other Encounters  ?Medication Dose Route Frequency Provider Last Rate Last Admin  ? topical emolient (BIAFINE) emulsion   Topical Daily Eppie Gibson, MD   Given at 05/28/12 1400  ? ? ?Allergies  ?Allergen Reactions  ? Rosuvastatin Calcium   ?  Headaches, fatigue  ? ? ?Social History  ? ?Socioeconomic History  ? Marital status: Married  ?  Spouse name: Not on file  ? Number of children: Not on file  ? Years of education: Not on file  ? Highest education level: Not on file  ?Occupational History  ? Not on file  ?Tobacco Use  ? Smoking status: Never  ? Smokeless tobacco: Never  ?Substance and Sexual Activity  ? Alcohol use: No  ? Drug use: No  ? Sexual activity: Yes  ?Other Topics Concern  ? Not on file  ?Social History Narrative  ? Not on file  ? ?Social Determinants of Health  ? ?Financial Resource Strain: Not on file  ?Food Insecurity: Not on file  ?Transportation Needs: Not on file  ?Physical Activity: Not on file  ?Stress: Not on file  ?Social Connections: Not on file  ?Intimate Partner Violence: Not on file  ? ? ?Family History  ?Problem Relation Age of Onset  ? Breast cancer Mother 31  ? Heart failure Mother   ? Prostate cancer Maternal Uncle   ?     died in his 58s  ? Heart attack Father   ? Diabetes Maternal Uncle 15  ? ? ?ROS- All systems are reviewed and negative except as per the HPI above ? ?Physical Exam: ?Vitals:  ? 02/01/22 0945  ?Weight: 70.9 kg  ?Height: 5' 3" (1.6 m)  ? ?Wt Readings from Last 3 Encounters:  ?02/01/22 70.9 kg  ?01/25/22 68 kg  ?09/21/21 70.9 kg  ? ? ?Labs: ?Lab Results  ?Component Value Date  ? NA 132 (L) 01/22/2022  ? K 4.5 01/22/2022  ? CL 96 01/22/2022  ? CO2 29 01/22/2022  ? GLUCOSE 123 (H) 01/22/2022  ? BUN 10 01/22/2022  ? CREATININE 0.75 01/22/2022  ? CALCIUM 9.2 01/22/2022  ? MG 1.7 01/13/2015  ? ?Lab Results  ?Component Value Date  ?  INR 0.98 01/11/2015  ? ?Lab Results  ?Component Value Date  ? CHOL 185 01/22/2022  ? HDL 73.80 01/22/2022  ? [place] 99 01/22/2022  ? TRIG 65.0 01/22/2022  ? ? ? ?GEN- The patient is well appearing, alert a

## 2022-02-01 NOTE — Patient Instructions (Signed)
Cardizem '30mg'$  --- Take 1 tablet every 4 hours AS NEEDED for HR >100 as long as top BP >100.   ?

## 2022-02-02 ENCOUNTER — Telehealth: Payer: Self-pay

## 2022-02-02 NOTE — Telephone Encounter (Signed)
Patient picked up infusion kit ?

## 2022-02-02 NOTE — Telephone Encounter (Signed)
Patient called in states that she is on her last infusion set. DME and insurance company are having issues. States someone is going to get back with her today. Informed her that we have 3 infusion sets that we can give her. She will stop by today to pick up ?

## 2022-02-05 DIAGNOSIS — E1065 Type 1 diabetes mellitus with hyperglycemia: Secondary | ICD-10-CM | POA: Diagnosis not present

## 2022-02-09 ENCOUNTER — Encounter: Payer: Self-pay | Admitting: Endocrinology

## 2022-02-10 ENCOUNTER — Encounter (HOSPITAL_COMMUNITY): Payer: Self-pay | Admitting: Emergency Medicine

## 2022-02-10 ENCOUNTER — Observation Stay (HOSPITAL_COMMUNITY)
Admission: EM | Admit: 2022-02-10 | Discharge: 2022-02-11 | Disposition: A | Discharge status: Home / Self Care | Payer: PPO | Attending: Family Medicine | Admitting: Family Medicine

## 2022-02-10 ENCOUNTER — Other Ambulatory Visit: Payer: Self-pay

## 2022-02-10 ENCOUNTER — Other Ambulatory Visit (HOSPITAL_COMMUNITY): Payer: PPO

## 2022-02-10 DIAGNOSIS — E871 Hypo-osmolality and hyponatremia: Secondary | ICD-10-CM | POA: Insufficient documentation

## 2022-02-10 DIAGNOSIS — E039 Hypothyroidism, unspecified: Secondary | ICD-10-CM | POA: Insufficient documentation

## 2022-02-10 DIAGNOSIS — E109 Type 1 diabetes mellitus without complications: Secondary | ICD-10-CM | POA: Diagnosis present

## 2022-02-10 DIAGNOSIS — R2689 Other abnormalities of gait and mobility: Secondary | ICD-10-CM | POA: Diagnosis not present

## 2022-02-10 DIAGNOSIS — H3412 Central retinal artery occlusion, left eye: Principal | ICD-10-CM | POA: Insufficient documentation

## 2022-02-10 DIAGNOSIS — H349 Unspecified retinal vascular occlusion: Secondary | ICD-10-CM | POA: Diagnosis not present

## 2022-02-10 DIAGNOSIS — I110 Hypertensive heart disease with heart failure: Secondary | ICD-10-CM | POA: Insufficient documentation

## 2022-02-10 DIAGNOSIS — Z853 Personal history of malignant neoplasm of breast: Secondary | ICD-10-CM | POA: Insufficient documentation

## 2022-02-10 DIAGNOSIS — I10 Essential (primary) hypertension: Secondary | ICD-10-CM | POA: Diagnosis present

## 2022-02-10 DIAGNOSIS — I5032 Chronic diastolic (congestive) heart failure: Secondary | ICD-10-CM | POA: Insufficient documentation

## 2022-02-10 DIAGNOSIS — I4891 Unspecified atrial fibrillation: Secondary | ICD-10-CM | POA: Diagnosis present

## 2022-02-10 DIAGNOSIS — E785 Hyperlipidemia, unspecified: Secondary | ICD-10-CM | POA: Diagnosis present

## 2022-02-10 DIAGNOSIS — Z7901 Long term (current) use of anticoagulants: Secondary | ICD-10-CM | POA: Insufficient documentation

## 2022-02-10 DIAGNOSIS — H53132 Sudden visual loss, left eye: Secondary | ICD-10-CM | POA: Diagnosis present

## 2022-02-10 DIAGNOSIS — Z79899 Other long term (current) drug therapy: Secondary | ICD-10-CM | POA: Insufficient documentation

## 2022-02-10 DIAGNOSIS — I480 Paroxysmal atrial fibrillation: Secondary | ICD-10-CM | POA: Insufficient documentation

## 2022-02-10 DIAGNOSIS — Z794 Long term (current) use of insulin: Secondary | ICD-10-CM | POA: Diagnosis not present

## 2022-02-10 DIAGNOSIS — R29818 Other symptoms and signs involving the nervous system: Secondary | ICD-10-CM | POA: Diagnosis not present

## 2022-02-10 LAB — PROTIME-INR
INR: 1.1 (ref 0.8–1.2)
Prothrombin Time: 13.8 seconds (ref 11.4–15.2)

## 2022-02-10 LAB — CBC WITH DIFFERENTIAL/PLATELET
Abs Immature Granulocytes: 0.02 10*3/uL (ref 0.00–0.07)
Basophils Absolute: 0.1 10*3/uL (ref 0.0–0.1)
Basophils Relative: 1 %
Eosinophils Absolute: 0.1 10*3/uL (ref 0.0–0.5)
Eosinophils Relative: 2 %
HCT: 37.9 % (ref 36.0–46.0)
Hemoglobin: 12.7 g/dL (ref 12.0–15.0)
Immature Granulocytes: 0 %
Lymphocytes Relative: 38 %
Lymphs Abs: 1.8 10*3/uL (ref 0.7–4.0)
MCH: 29.8 pg (ref 26.0–34.0)
MCHC: 33.5 g/dL (ref 30.0–36.0)
MCV: 89 fL (ref 80.0–100.0)
Monocytes Absolute: 0.6 10*3/uL (ref 0.1–1.0)
Monocytes Relative: 12 %
Neutro Abs: 2.2 10*3/uL (ref 1.7–7.7)
Neutrophils Relative %: 47 %
Platelets: 205 10*3/uL (ref 150–400)
RBC: 4.26 MIL/uL (ref 3.87–5.11)
RDW: 13.6 % (ref 11.5–15.5)
WBC: 4.8 10*3/uL (ref 4.0–10.5)
nRBC: 0 % (ref 0.0–0.2)

## 2022-02-10 LAB — COMPREHENSIVE METABOLIC PANEL
ALT: 23 U/L (ref 0–44)
AST: 25 U/L (ref 15–41)
Albumin: 3.7 g/dL (ref 3.5–5.0)
Alkaline Phosphatase: 85 U/L (ref 38–126)
Anion gap: 9 (ref 5–15)
BUN: 11 mg/dL (ref 8–23)
CO2: 26 mmol/L (ref 22–32)
Calcium: 9.2 mg/dL (ref 8.9–10.3)
Chloride: 98 mmol/L (ref 98–111)
Creatinine, Ser: 0.67 mg/dL (ref 0.44–1.00)
GFR, Estimated: >60 mL/min (ref >60)
Glucose, Bld: 160 mg/dL — ABNORMAL HIGH (ref 70–99)
Potassium: 4 mmol/L (ref 3.5–5.1)
Sodium: 133 mmol/L — ABNORMAL LOW (ref 135–145)
Total Bilirubin: 0.7 mg/dL (ref 0.3–1.2)
Total Protein: 6.7 g/dL (ref 6.5–8.1)

## 2022-02-10 LAB — APTT: aPTT: 32 seconds (ref 24–36)

## 2022-02-10 NOTE — ED Provider Triage Note (Signed)
Emergency Medicine Provider Triage Evaluation Note ? ?Gina [name] , a 76 y.o. female  was evaluated in triage.  Patient was sent here by ophthalmologist for concern of stroke within her eye.  At approximately 1500-1600 (7 to 8 hours prior to my evaluation) patient experienced sudden loss of vision of the left eye.  She visited her ophthalmologist who dilated the pupil and noticed a small plaque within the left eye and other findings that concerned him for possible stroke.  Referred to the ED for stroke work-up.  Patient continues to have similar symptoms.  She otherwise denies weakness, fatigue, numbness or tingling, facial droop, slurred speech or other complaints. ?Review of Systems  ?Positive: Sudden painless vision loss of left eye ?Negative: As above ? ?Physical Exam  ?BP (!) 203/78   Pulse 74   Temp 98.1 ?F (36.7 ?C) (Oral)   Resp 18   SpO2 100%  ?Gen:   Awake, no distress   ?Resp:  Normal effort  ?MSK:   Moves extremities without difficulty  ?Other:  Left eye dilated ?Speech is clear, able to follow commands ?CN III-XII intact ?Normal strength in upper and lower extremities bilaterally including dorsiflexion and plantar flexion, strong and equal grip strength ?Sensation normal to light and sharp touch ?Moves extremities without ataxia, coordination intact ?Normal finger to nose and rapid alternating movements ?No pronator drift ? ? ?Medical Decision Making  ?Medically screening exam initiated at 11:05 PM.  Appropriate orders placed.  Gina [name] was informed that the remainder of the evaluation will be completed by another provider, this initial triage assessment does not replace that evaluation, and the importance of remaining in the ED until their evaluation is complete. ? ? ?  ?Tonye Pearson, [place] ?02/10/22 2309 ? ?

## 2022-02-10 NOTE — ED Triage Notes (Signed)
Pt reported to ED from [facility] for MRI of left eye for evaluation of possible stroke. Reports having vision changes to left eye since approximately 3 or 4 pm like seeing "shadow figures", states that symptoms appear to come and go.   ?

## 2022-02-11 ENCOUNTER — Observation Stay (HOSPITAL_BASED_OUTPATIENT_CLINIC_OR_DEPARTMENT_OTHER): Payer: PPO

## 2022-02-11 ENCOUNTER — Emergency Department (HOSPITAL_COMMUNITY): Payer: PPO

## 2022-02-11 ENCOUNTER — Encounter (HOSPITAL_COMMUNITY): Payer: Self-pay | Admitting: Internal Medicine

## 2022-02-11 ENCOUNTER — Observation Stay (HOSPITAL_COMMUNITY): Payer: PPO

## 2022-02-11 DIAGNOSIS — I4819 Other persistent atrial fibrillation: Secondary | ICD-10-CM

## 2022-02-11 DIAGNOSIS — E871 Hypo-osmolality and hyponatremia: Secondary | ICD-10-CM | POA: Diagnosis not present

## 2022-02-11 DIAGNOSIS — I10 Essential (primary) hypertension: Secondary | ICD-10-CM | POA: Diagnosis not present

## 2022-02-11 DIAGNOSIS — E109 Type 1 diabetes mellitus without complications: Secondary | ICD-10-CM | POA: Diagnosis not present

## 2022-02-11 DIAGNOSIS — H3412 Central retinal artery occlusion, left eye: Secondary | ICD-10-CM

## 2022-02-11 DIAGNOSIS — I672 Cerebral atherosclerosis: Secondary | ICD-10-CM | POA: Diagnosis not present

## 2022-02-11 DIAGNOSIS — E785 Hyperlipidemia, unspecified: Secondary | ICD-10-CM

## 2022-02-11 DIAGNOSIS — E039 Hypothyroidism, unspecified: Secondary | ICD-10-CM | POA: Diagnosis not present

## 2022-02-11 DIAGNOSIS — I6381 Other cerebral infarction due to occlusion or stenosis of small artery: Secondary | ICD-10-CM | POA: Diagnosis not present

## 2022-02-11 DIAGNOSIS — I651 Occlusion and stenosis of basilar artery: Secondary | ICD-10-CM | POA: Diagnosis not present

## 2022-02-11 DIAGNOSIS — R29818 Other symptoms and signs involving the nervous system: Secondary | ICD-10-CM | POA: Diagnosis not present

## 2022-02-11 DIAGNOSIS — J341 Cyst and mucocele of nose and nasal sinus: Secondary | ICD-10-CM | POA: Diagnosis not present

## 2022-02-11 DIAGNOSIS — I6622 Occlusion and stenosis of left posterior cerebral artery: Secondary | ICD-10-CM | POA: Diagnosis not present

## 2022-02-11 DIAGNOSIS — Z7901 Long term (current) use of anticoagulants: Secondary | ICD-10-CM | POA: Diagnosis not present

## 2022-02-11 DIAGNOSIS — H5462 Unqualified visual loss, left eye, normal vision right eye: Secondary | ICD-10-CM | POA: Diagnosis not present

## 2022-02-11 DIAGNOSIS — I6603 Occlusion and stenosis of bilateral middle cerebral arteries: Secondary | ICD-10-CM | POA: Diagnosis not present

## 2022-02-11 LAB — C-REACTIVE PROTEIN: CRP: 0.6 mg/dL (ref <1.0)

## 2022-02-11 LAB — GLUCOSE, CAPILLARY
Glucose-Capillary: 229 mg/dL — ABNORMAL HIGH (ref 70–99)
Glucose-Capillary: 266 mg/dL — ABNORMAL HIGH (ref 70–99)

## 2022-02-11 LAB — ECHOCARDIOGRAM COMPLETE
AR max vel: 2.75 cm2
AV Area VTI: 2.45 cm2
AV Area mean vel: 2.73 cm2
AV Mean grad: 2 mmHg
AV Peak grad: 4.4 mmHg
Ao pk vel: 1.05 m/s
Area-P 1/2: 2.94 cm2
Calc EF: 57.4 %
MV VTI: 2.59 cm2
S' Lateral: 2.3 cm
Single Plane A2C EF: 60 %
Single Plane A4C EF: 58.4 %

## 2022-02-11 LAB — LIPID PANEL
Cholesterol: 203 mg/dL — ABNORMAL HIGH (ref 0–200)
HDL: 81 mg/dL (ref >40)
LDL Cholesterol: 113 mg/dL — ABNORMAL HIGH (ref 0–99)
Total CHOL/HDL Ratio: 2.5 RATIO
Triglycerides: 47 mg/dL (ref <150)
VLDL: 9 mg/dL (ref 0–40)

## 2022-02-11 LAB — SEDIMENTATION RATE: Sed Rate: 9 mm/hr (ref 0–22)

## 2022-02-11 LAB — CBG MONITORING, ED: Glucose-Capillary: 132 mg/dL — ABNORMAL HIGH (ref 70–99)

## 2022-02-11 MED ORDER — THYROID 30 MG PO TABS
45.0000 mg | ORAL_TABLET | Freq: Every day | ORAL | Status: DC
  Administered 2022-02-11: 45 mg via ORAL
  Filled 2022-02-11 (×3): qty 2

## 2022-02-11 MED ORDER — EZETIMIBE 10 MG PO TABS
10.0000 mg | ORAL_TABLET | Freq: Every day | ORAL | Status: DC
  Administered 2022-02-11: 10 mg via ORAL
  Filled 2022-02-11: qty 1

## 2022-02-11 MED ORDER — IOHEXOL 350 MG/ML SOLN
100.0000 mL | Freq: Once | INTRAVENOUS | Status: AC | PRN
  Administered 2022-02-11: 100 mL via INTRAVENOUS

## 2022-02-11 MED ORDER — STROKE: EARLY STAGES OF RECOVERY BOOK: Freq: Once | Status: AC

## 2022-02-11 MED ORDER — ACETAMINOPHEN 650 MG RE SUPP: 650.0000 mg | RECTAL | Status: DC | PRN

## 2022-02-11 MED ORDER — THYROID 30 MG PO TABS: 15.0000 mg | ORAL_TABLET | Freq: Every day | ORAL | Status: DC

## 2022-02-11 MED ORDER — ACETAMINOPHEN 160 MG/5ML PO SOLN: 650.0000 mg | ORAL | Status: DC | PRN

## 2022-02-11 MED ORDER — APIXABAN 5 MG PO TABS
5.0000 mg | ORAL_TABLET | Freq: Two times a day (BID) | ORAL | Status: DC
  Administered 2022-02-11: 5 mg via ORAL
  Filled 2022-02-11: qty 1

## 2022-02-11 MED ORDER — ACETAMINOPHEN 325 MG PO TABS
650.0000 mg | ORAL_TABLET | ORAL | Status: DC | PRN
  Administered 2022-02-11: 650 mg via ORAL
  Filled 2022-02-11: qty 2

## 2022-02-11 MED ORDER — INSULIN PUMP
Freq: Three times a day (TID) | SUBCUTANEOUS | Status: DC
  Administered 2022-02-11: 2 via SUBCUTANEOUS
  Filled 2022-02-11: qty 1

## 2022-02-11 MED ORDER — THYROID 30 MG PO TABS: 30.0000 mg | ORAL_TABLET | Freq: Every day | ORAL | Status: DC

## 2022-02-11 MED ORDER — STROKE: EARLY STAGES OF RECOVERY BOOK
Status: AC
  Filled 2022-02-11: qty 1

## 2022-02-11 NOTE — Assessment & Plan Note (Addendum)
Painless monocular vision loss. Neurology consulted. MRI confirms no acute CVA. CTA head and neck without large vessel occlusion. LDL of 113. Hemoglobin A1C of 7.% from earlier this month. Transthoracic Echocardiogram significant for no atrial thrombus and no interatrial shunt. Per neurology, likely not embolic but rather intrinsic vessel narrowing. Neurology recommendations for addition of aspirin 81 mg to patient's regimen of Eliquis, in addition to the addition of a low-dose statin since patient has a prior intolerance to medium intensity; patient declines aspirin and statin recommendations. PT/OT recommendations for no follow-up. Patient discharged on no new medications since she declined initiation of aspirin and statin. Recommendation for Neurology follow-up in 4 weeks. ?

## 2022-02-11 NOTE — Assessment & Plan Note (Addendum)
Chronic Stable 

## 2022-02-11 NOTE — Progress Notes (Addendum)
STROKE TEAM PROGRESS NOTE  ? ?INTERVAL HISTORY ?Gina [name] is a 76 y.o. female with PMH significant for diabetes, hypertension, hypothyroidism, SIADH, atrial fibrillation status post ablation and on Eliquis 5 mg twice daily and compliant with her Eliquis who presents with sudden onset painless vision loss in her left eye. She saw ophthalmology who had concerns for L CRAO.  ? ?The patient is seen in her room this morning with a family member at bedside. She reports improvement in her L eye vision since yesterday, whereas she had near complete blindness yesterday, now she is able to make out figures. Her peripheral vision is significantly better than her central vision. After a long discussion the patient declines to start ASA in addition to Eliquis and addition of a statin. She states that she will discuss this with her PCP.    ? ?Vitals:  ? 02/11/22 0345 02/11/22 0430 02/11/22 0909 02/11/22 1113  ?BP: (!) 178/75 (!) 160/66 (!) 180/63 (!) 189/78  ?Pulse: 67 66 72 76  ?Resp: '17  16 20  '$ ?Temp:   98.3 ?F (36.8 ?C)   ?TempSrc:   Oral   ?SpO2: 97% 98% 99% 99%  ? ?CBC:  ?Recent Labs  ?Lab 02/10/22 ?2310  ?WBC 4.8  ?NEUTROABS 2.2  ?HGB 12.7  ?HCT 37.9  ?MCV 89.0  ?PLT 205  ? ?Basic Metabolic Panel:  ?Recent Labs  ?Lab 02/10/22 ?2310  ?NA 133*  ?K 4.0  ?CL 98  ?CO2 26  ?GLUCOSE 160*  ?BUN 11  ?CREATININE 0.67  ?CALCIUM 9.2  ? ?Lipid Panel:  ?Recent Labs  ?Lab 02/10/22 ?2310  ?CHOL 203*  ?TRIG 47  ?HDL 81  ?CHOLHDL 2.5  ?VLDL 9  ?LDLCALC 113*  ? ?HgbA1c: No results for input(s): HGBA1C in the last 168 hours. ?Urine Drug Screen: No results for input(s): LABOPIA, COCAINSCRNUR, LABBENZ, AMPHETMU, THCU, LABBARB in the last 168 hours.  ?Alcohol Level No results for input(s): ETH in the last 168 hours. ? ?IMAGING past 24 hours ?CT ANGIO HEAD NECK W WO CM ? ?Result Date: 02/11/2022 ?CLINICAL DATA:  76 year old female with left eye vision loss. No acute finding on brain MRI 0213 hours today. EXAM: CT ANGIOGRAPHY HEAD AND NECK  TECHNIQUE: Multidetector CT imaging of the head and neck was performed using the standard protocol during bolus administration of intravenous contrast. Multiplanar CT image reconstructions and MIPs were obtained to evaluate the vascular anatomy. Carotid stenosis measurements (when applicable) are obtained utilizing NASCET criteria, using the distal internal carotid diameter as the denominator. RADIATION DOSE REDUCTION: This exam was performed according to the departmental dose-optimization program which includes automated exposure control, adjustment of the mA and/or kV according to patient size and/or use of iterative reconstruction technique. CONTRAST:  170m OMNIPAQUE IOHEXOL 350 MG/ML SOLN COMPARISON:  Brain MRI 0213 hours and plain head CT 0009 hours today. CTA chest 01/13/2015. FINDINGS: CTA NECK Skeleton: Bilateral TMJ degeneration. Cervical spine degeneration. 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 No acute osseous abnormality identified. Upper chest: Apical and subpleural lung scarring is chronic and not significantly changed since 2016. Partially visible increased number of small superior mediastinal lymph nodes also not definitely changed since 2016 (series 5, image 170). Other neck: Subcentimeter right thyroid lobe hypodense nodule, Not clinically significant; no follow-up imaging recommended other neck soft tissues are within normal limits. (Ref: J Am Coll Radiol. 2015 Feb;12(2): 143-50).Three vessel arch configuration. Mild arch atherosclerosis. Aortic arch: Mild brachiocephalic artery soft plaque without stenosis. Mildly tortuous but otherwise negative right CCA. Minimal calcified  plaque at the right ICA origin. Tortuous right ICA without stenosis. Right carotid system: Mildly tortuous but otherwise negative left CCA. Mild soft and calcified plaque at the left ICA origin and bulb without stenosis. Left carotid system: Mild soft and calcified plaque in the proximal right subclavian artery without  stenosis. Patent right vertebral artery origin with somewhat long segment soft plaque in the right vertebral V1 segment resulting in mild stenosis (series 8, image 148). Late entry of the right vertebral artery into the cervical transverse foramen. But the right vertebral is patent and otherwise normal to the skull base. Mild to moderate proximal left subclavian artery mostly soft plaque without stenosis. Normal left vertebral artery origin. Tortuous left V1 segment. Codominant left vertebral artery is patent and normal to the skull base. Vertebral arteries: Distal vertebral arteries and vertebrobasilar junction are within normal limits. Normal PICA origins. Patent basilar artery without stenosis. Normal SCA and PCA origins. Posterior communicating arteries are diminutive or absent. Proximal PCAs are patent without stenosis. But there is moderate to severe right and mild-to-moderate left P3 branch irregularity and stenosis (series 12, image 22 on the right). CTA HEAD Posterior circulation: Both ICA siphons are patent. Faint calcified siphon plaque without stenosis. Patent carotid termini, MCA and ACA origins. A1 segments and anterior communicating arteries are within normal limits. As a Gus ACA anatomy without stenosis (series 12, image 26). Mild irregularity and stenosis at the left MCA origin and M1 segment (series 10, image 22 and series 11, image 23). Left M1 and left MCA bifurcation are patent. Only mild irregularity of left MCA branches. Contralateral right MCA M1 segment and bifurcation are patent without stenosis. Moderate irregularity and stenosis at the posterior right MCA M2 origin on series 10, image 24. Mild right MCA branch irregularity otherwise. Anterior circulation:  Early contrast timing but grossly patent. Venous sinuses: Azygous ACA anatomy. Anatomic variants: Review of the MIP images confirms the above findings IMPRESSION: 1. Negative for large vessel occlusion. 2. Mild for age extracranial  atherosclerosis with no significant carotid or vertebral artery stenosis. But positive for intracranial atherosclerosis: - Moderate to Severe Right > Left PCA P3 branch stenoses. - Moderate Right MCA posterior M2 branch origin stenosis. - mild Left MCA M1 stenosis. 3. Chronic apical and subpleural lung scarring. Visible increased number of small superior mediastinal lymph nodes grossly stable from 2016 CTA. Electronically Signed   By: Genevie Ann M.D.   On: 02/11/2022 06:46  ? ?CT Head Wo Contrast ? ?Result Date: 02/11/2022 ?CLINICAL DATA:  Neuro deficit, acute, stroke suspected. Vision changes to left eye. EXAM: CT HEAD WITHOUT CONTRAST TECHNIQUE: Contiguous axial images were obtained from the base of the skull through the vertex without intravenous contrast. RADIATION DOSE REDUCTION: This exam was performed according to the departmental dose-optimization program which includes automated exposure control, adjustment of the mA and/or kV according to patient size and/or use of iterative reconstruction technique. COMPARISON:  None. FINDINGS: Brain: No acute intracranial hemorrhage, midline shift or mass effect. No extra-axial fluid collection. Diffuse atrophy is noted. There is a hypodensity in the thalamus on the left measuring 8 mm. Vascular: Atherosclerotic calcification of the carotid siphons. No hyperdense vessel. Skull: Normal. Negative for fracture or focal lesion. Sinuses/Orbits: No acute finding. Other: None. IMPRESSION: 1. No acute intracranial hemorrhage. 2. Atrophy. 3. Hypodensity in the basal ganglia on the left, possible old lacunar infarct versus prominent perivascular space. Electronically Signed   By: Brett Fairy M.D.   On: 02/11/2022 00:14  ? ?  MR BRAIN WO CONTRAST ? ?Result Date: 02/11/2022 ?CLINICAL DATA:  Stroke suspected left eye vision loss EXAM: MRI HEAD WITHOUT CONTRAST TECHNIQUE: Multiplanar, multiecho pulse sequences of the brain and surrounding structures were obtained without intravenous  contrast. COMPARISON:  No prior MRI, correlation is made with CT head 02/11/2022 FINDINGS: Brain: No restricted diffusion to suggest acute or subacute infarct. No acute hemorrhage, mass, mass effect, or midline sh

## 2022-02-11 NOTE — Assessment & Plan Note (Addendum)
LDL of 113 currently. History of Crestor use about 5 years prior. History of intolerance in addition to patient stating she read the side affects and did not agree with using the medication. Recommendation to at least start a low-dose statin this admission, but patient declined. ?

## 2022-02-11 NOTE — Assessment & Plan Note (Addendum)
Continue thyroid tablet. ?

## 2022-02-11 NOTE — Discharge Instructions (Signed)
You were admitted to the hospital for a central retinal artery occlusion (CRAO), which we believe, in your case, is due to narrowing of the blood vessel due to atherosclerotic disease. To prevent recurrence of this condition and other bad outcomes, such as a stroke, we recommend the addition of aspirin 81 mg daily to the Eliquis you are currently taking. Your LDL was also found to be 113, which is much higher than our goal of less than 70. To treat this, we recommend the addition of a statin, despite your mild adverse reaction to a statin previously. We recommend starting with a low intensity statin such as pravastatin 10-20 mg daily.  ? ?Corky Sox, MD ?

## 2022-02-11 NOTE — Evaluation (Signed)
Physical Therapy Evaluation ?Patient Details ?Name: Gina [name] ?MRN: [id number] ?DOB: 1945-12-16 ?Today's Date: 02/11/2022 ? ?History of Present Illness ? The pt is a 76 yo female presenting 4/29 from Opthalmologist due to acute vision changes in her left eye. 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 PMH includes: DM I, HTN, breast cancer s/p radiation, CHF, hypothyroidism, SIADH, afib s/p ablation. ?  ?Clinical Impression ? Pt in bed upon arrival of PT, agreeable to evaluation at this time. Prior to admission the pt was completely independent without need for DME or physical assist. The pt lives in a home with 3 steps to enter with her husband. The pt was able to complete all mobility and balance challenges without evidence of instability, and was able to navigate around obstacles without issue or need for cues despite changes in L vision. Pt states her mobility is at her baseline, and I agree she is safe to return home with family when medically stable.    ? ?Gait Speed: 0.94ms. (Gait speed < 1.043m indicates increased risk of falls) ? ?Dynamic Gait Index (DGI): 22/24 (<19 indicates increased risk for falls) ?  ? ?Recommendations for follow up therapy are one component of a multi-disciplinary discharge planning process, led by the attending physician.  Recommendations may be updated based on patient status, additional functional criteria and insurance authorization. ? ?Follow Up Recommendations No PT follow up ? ?  ?Assistance Recommended at Discharge PRN  ?Patient can return home with the following ? Assist for transportation ? ?  ?Equipment Recommendations None recommended by PT  ?Recommendations for Other Services ?    ?  ?Functional Status Assessment Patient has had a recent decline in their functional status and demonstrates the ability to make significant improvements in function in a reasonable and predictable amount of time.  ? ?  ?Precautions / Restrictions Precautions ?Precautions:  None ?Precaution Comments: impaired vision L eye ?Restrictions ?Weight Bearing Restrictions: No  ? ?  ? ?Mobility ? Bed Mobility ?Overal bed mobility: Independent ?  ?  ?  ?  ?  ?  ?  ?  ? ?Transfers ?Overall transfer level: Independent ?Equipment used: None ?  ?  ?  ?  ?  ?  ?  ?  ?  ? ?Ambulation/Gait ?Ambulation/Gait assistance: Supervision ?Gait Distance (Feet): 300 Feet ?Assistive device: None ?Gait Pattern/deviations: WFL(Within Functional Limits) ?Gait velocity: 0.91 m/s ?Gait velocity interpretation: >2.62 ft/sec, indicative of community ambulatory ?  ?General Gait Details: no overt LOB or instability, able to avoid all obstacles on L, discussed visual scanning techniques ? ?Stairs ?Stairs: Yes ?Stairs assistance: Supervision ?Stair Management: One rail Right, Alternating pattern, Forwards ?Number of Stairs: 5 ?General stair comments: very slight use of rail, no instability ? ? ?Modified Rankin (Stroke Patients Only) ?Modified Rankin (Stroke Patients Only) ?Pre-Morbid Rankin Score: No symptoms ?Modified Rankin: Moderate disability ? ?  ? ?Balance Overall balance assessment: Mild deficits observed, not formally tested ?  ?  ?  ?  ?  ?  ?  ?  ?  ?  ?  ?  ?  ?  ?  ?Standardized Balance Assessment ?Standardized Balance Assessment : Dynamic Gait Index ?  ?Dynamic Gait Index ?Level Surface: Normal ?Change in Gait Speed: Normal ?Gait with Horizontal Head Turns: Normal ?Gait with Vertical Head Turns: Normal ?Gait and Pivot Turn: Normal ?Step Over Obstacle: Mild Impairment ?Step Around Obstacles: Normal ?Steps: Mild Impairment ?Total Score: 22 ?   ? ? ? ?Pertinent Vitals/Pain Pain Assessment ?Pain  Assessment: Faces ?Faces Pain Scale: Hurts a little bit ?Pain Location: headache ?Pain Descriptors / Indicators: Headache ?Pain Intervention(s): Monitored during session  ? ? ?Home Living Family/patient expects to be discharged to:: Private residence ?Living Arrangements: Spouse/significant other ?Available Help at  Discharge: Family ?Type of Home: House ?Home Access: Stairs to enter ?Entrance Stairs-Rails: Right ?Entrance Stairs-Number of Steps: 3 ?  ?Home Layout: Laundry or work area in basement;Able to live on main level with bedroom/bathroom (stores canned goods) ?Home Equipment: None ?Additional Comments: pt previously independent  ?  ?Prior Function Prior Level of Function : Independent/Modified Independent;Driving ?  ?  ?  ?  ?  ?  ?Mobility Comments: independent, sews for hobby. no falls in last 6 month ?ADLs Comments: independent ?  ? ? ?Hand Dominance  ? Dominant Hand: Left ? ?  ?Extremity/Trunk Assessment  ? Upper Extremity Assessment ?Upper Extremity Assessment: Defer to OT evaluation ?  ? ?Lower Extremity Assessment ?Lower Extremity Assessment: Overall WFL for tasks assessed ?  ? ?Cervical / Trunk Assessment ?Cervical / Trunk Assessment: Normal  ?Communication  ? Communication: No difficulties  ?Cognition Arousal/Alertness: Awake/alert ?Behavior During Therapy: [facility] for tasks assessed/performed ?Overall Cognitive Status: Within Functional Limits for tasks assessed ?  ?  ?  ?  ?  ?  ?  ?  ?  ?  ?  ?  ?  ?  ?  ?  ?General Comments: pleasant and reports no changes in cognition, family present and agrees ?  ?  ? ?  ?General Comments General comments (skin integrity, edema, etc.): VSS on RA ? ?  ?   ? ?Assessment/Plan  ?  ?PT Assessment Patient does not need any further PT services  ?   ?   ? ?PT Goals (Current goals can be found in the Care Plan section)  ?Acute Rehab PT Goals ?Patient Stated Goal: return home, get back to sewing ?PT Goal Formulation: With patient ?Time For Goal Achievement: 02/25/22 ?Potential to Achieve Goals: Good ? ?  ? ?AM-PAC PT "6 Clicks" Mobility  ?Outcome Measure Help needed turning from your back to your side while in a flat bed without using bedrails?: None ?Help needed moving from lying on your back to sitting on the side of a flat bed without using bedrails?: None ?Help needed moving to and  from a bed to a chair (including a wheelchair)?: None ?Help needed standing up from a chair using your arms (e.g., wheelchair or bedside chair)?: None ?Help needed to walk in hospital room?: A Little ?Help needed climbing 3-5 steps with a railing? : A Little ?6 Click Score: 22 ? ?  ?End of Session Equipment Utilized During Treatment: Gait belt ?Activity Tolerance: Patient tolerated treatment well ?Patient left: in bed;with call bell/phone within reach;with family/visitor present ?Nurse Communication: Mobility status ?PT Visit Diagnosis: Other abnormalities of gait and mobility (R26.89) ?  ? ?Time: 1660-6004 ?PT Time Calculation (min) (ACUTE ONLY): 14 min ? ? ?Charges:   PT Evaluation ?$PT Eval Low Complexity: 1 Low ?  ?  ?   ? ? ?West Carbo, PT, DPT  ? ?Acute Rehabilitation Department ?Pager #: [phone] - [phone] ? ?Gina [name] ?02/11/2022, 1:07 PM ? ?

## 2022-02-11 NOTE — ED Notes (Signed)
Breakfast Orders Placed °

## 2022-02-11 NOTE — Progress Notes (Signed)
*  PRELIMINARY RESULTS* ?Echocardiogram ?2D Echocardiogram has been performed. ? ?Gina [name] ?02/11/2022, 2:19 PM ?

## 2022-02-11 NOTE — ED Provider Notes (Signed)
? ?[facility]  ?Provider Note ? ?CSN: [id number] ?Arrival date & time: 02/10/22 2227 ? ?History ?Chief Complaint  ?Patient presents with  ? Eye Problem  ? ? ?Gina [name] is a 76 y.o. female with history of afib post-ablation in 2016, continues on Eliquis. She reports painless L monocular vision loss around 3pm initially was intermittent now persistent. She called her Ophthalmologist who was able to see her late this evening and was concerned about CRAO, his hand written note is in Media Tab for review. Sent to the ED for evaluation. She has no other acute symptoms.  ? ? ? ?Home Medications ?Prior to Admission medications   ?Medication Sig Start Date End Date Taking? Authorizing Provider  ?apixaban (ELIQUIS) 5 MG TABS tablet TAKE 1 TABLET BY MOUTH TWICE A DAY ?Patient taking differently: Take 5 mg by mouth 2 (two) times daily. 09/14/21  Yes Allred, Jeneen Rinks, MD  ?Ascorbic Acid (VITAMIN C) 1000 MG tablet Take 1,000 mg by mouth daily.    Yes [provider]  ?ASHWAGANDHA PO Take 380 mg by mouth daily.   Yes [provider]  ?Cholecalciferol (VITAMIN D PO) Take 1 drop by mouth daily. vitamin d3 plus k2   Yes [provider]  ?Coenzyme Q10 (COQ10) 200 MG CAPS Take 200 mg by mouth daily.   Yes [provider]  ?Cyanocobalamin (VITAMIN B-12) 5000 MCG SUBL Place 5,000 mcg under the tongue daily.   Yes [provider]  ?diltiazem (CARDIZEM) 30 MG tablet Take 1 tablet every 4 hours AS NEEDED for HR >100 as long as top BP >100. ?Patient taking differently: Take 30 mg by mouth every 4 (four) hours as needed (HR>100 as long as top BP>100). 02/01/22  Yes Sherran Needs, NP  ?ezetimibe (ZETIA) 10 MG tablet TAKE 1 TABLET BY MOUTH EVERY DAY ?Patient taking differently: Take 10 mg by mouth daily. 10/17/21  Yes Elayne Snare, MD  ?losartan (COZAAR) 25 MG tablet TAKE 1 TABLET BY MOUTH EVERY DAY ?Patient taking differently: Take 25 mg by mouth daily.  10/02/21  Yes Sherran Needs, NP  ?Misc Natural Products (GLUCOSAMINE CHOND MSM FORMULA PO) Take 15 mLs by mouth daily.   Yes [provider]  ?Multiple Vitamin (MULTI VITAMIN PO) Take 300 mg by mouth daily in the afternoon. Exipure- supports healthy weight loss-   Yes [provider]  ?Multiple Vitamin (MULTIVITAMIN ADULT PO) Take 1 tablet by mouth daily. Memo Max Pro-   Yes [provider]  ?Multiple Vitamin (MULTIVITAMIN ADULT PO) Take 1,000 mg by mouth in the morning and at bedtime. - Estrogen supplement   Yes [provider]  ?Multiple Vitamin (MULTIVITAMIN PO) Take 12.5 mg by mouth daily. Iodoral-   Yes [provider]  ?Multiple Vitamin (MULTIVITAMIN PO) Take 50 mcg by mouth daily. Cholestar- Vitamin K27-   Yes [provider]  ?NON FORMULARY Systemic Formulas Pancreas supplement, 1 per day ?"                    "           Metabo-Shake (Glycemic Support) 1 scoop per day ?Reishi w/ Curcumin 1 per day   Yes [provider]  ?NON FORMULARY Systemic Formulas Glycemic Balance, 2 per day ?"                  "             Eyes supplement, 2 per  day ?"                  "              ROX (Antioxidant w/ Reservatrol), 1 per day   Yes [provider]  ?NON FORMULARY Take 15 mLs by mouth daily. MAG CAL VANILLA LIQUID   Yes [provider]  ?NON FORMULARY Take 2 capsules by mouth every morning. Keraflex-   Yes [provider]  ?NOVOLOG 100 UNIT/ML injection USE MAX OF 60 UNITS DAILY VIA INSULIN PUMP. ?Patient taking differently: 60 Units See admin instructions. Via insulin pump 09/06/21  Yes Elayne Snare, MD  ?Omega-3 Fatty Acids (SUPER OMEGA-3 PO) Take 2 capsules by mouth daily.   Yes [provider]  ?Red Yeast Rice Extract (RED YEAST RICE PO) Take 1,200 mg by mouth daily in the afternoon.   Yes [provider]  ?RESVERATROL PO Take 960 mg by mouth daily.   Yes [provider]  ?thyroid (ARMOUR  THYROID) 15 MG tablet TAKE 1 TABLET DAILY WITH THE 30 MG TABLET (TOTAL OF 45 MG DAILY) ?Patient taking differently: Take 15 mg by mouth daily. Along with 30 mg tab to equal 45 mg qd 01/03/22  Yes Elayne Snare, MD  ?thyroid (ARMOUR) 30 MG tablet Take 1 tablet daily along with the 15 mg tablet for a total of 45 mg daily ?Patient taking differently: Take 30 mg by mouth daily before breakfast. Along with 15 mg to equal 45 mg qd 02/20/21  Yes Elayne Snare, MD  ?Turmeric Curcumin 500 MG CAPS Take 482 mg by mouth daily.   Yes [provider]  ?Continuous Blood Gluc Sensor (DEXCOM G6 SENSOR) MISC by Does not apply route.    [provider]  ?Continuous Blood Gluc Transmit (DEXCOM G6 TRANSMITTER) MISC by Does not apply route.    [provider]  ?glucose blood (ONE TOUCH ULTRA TEST) test strip Use asto check blood sugar 5 times a day 11/07/16   Elayne Snare, MD  ? ? ? ?Allergies    ?Rosuvastatin calcium ? ? ?Review of Systems   ?Review of Systems ?Please see HPI for pertinent positives and negatives ? ?Physical Exam ?BP (!) 178/75 (BP Location: Right Arm)   Pulse 69   Temp 97.8 ?F (36.6 ?C) (Oral)   Resp 17   SpO2 98%  ? ?Physical Exam ?Vitals and nursing note reviewed.  ?Constitutional:   ?   Appearance: Normal appearance.  ?HENT:  ?   Head: Normocephalic and atraumatic.  ?   Nose: Nose normal.  ?   Mouth/Throat:  ?   Mouth: Mucous membranes are moist.  ?Eyes:  ?   Extraocular Movements: Extraocular movements intact.  ?   Conjunctiva/sclera: Conjunctivae normal.  ?   Comments: L eye is dilated from ophtho office, she has light perception only in L eye, R eye visual acuity is grossly normal  ?Cardiovascular:  ?   Rate and Rhythm: Normal rate.  ?Pulmonary:  ?   Effort: Pulmonary effort is normal.  ?   Breath sounds: Normal breath sounds.  ?Abdominal:  ?   General: Abdomen is flat.  ?   Palpations: Abdomen is soft.  ?   Tenderness: There is no abdominal tenderness.  ?Musculoskeletal:     ?   General: No  swelling. Normal range of motion.  ?   Cervical back: Neck supple.  ?Skin: ?   General: Skin is warm and dry.  ?Neurological:  ?  General: No focal deficit present.  ?   Mental Status: She is alert and oriented to person, place, and time.  ?   Cranial Nerves: No cranial nerve deficit.  ?   Sensory: No sensory deficit.  ?   Motor: No weakness.  ?Psychiatric:     ?   Mood and Affect: Mood normal.  ? ? ?ED Results / Procedures / Treatments   ?EKG ?EKG Interpretation ? ?Date/Time:  Sunday February 11 2022 03:41:18 EDT ?Ventricular Rate:  69 ?PR Interval:  170 ?QRS Duration: 91 ?QT Interval:  417 ?QTC Calculation: 447 ?R Axis:   69 ?Text Interpretation: Sinus rhythm Normal ECG No significant change since last tracing Confirmed by Calvert Cantor [phone]) on 02/11/2022 4:02:09 AM ? ?Procedures ?Procedures ? ?Medications Ordered in the ED ?Medications  ? stroke: early stages of recovery book (has no administration in time range)  ?acetaminophen (TYLENOL) tablet 650 mg (has no administration in time range)  ?  Or  ?acetaminophen (TYLENOL) 160 MG/5ML solution 650 mg (has no administration in time range)  ?  Or  ?acetaminophen (TYLENOL) suppository 650 mg (has no administration in time range)  ?insulin pump (has no administration in time range)  ?apixaban (ELIQUIS) tablet 5 mg (has no administration in time range)  ?ezetimibe (ZETIA) tablet 10 mg (has no administration in time range)  ?thyroid (ARMOUR) tablet 15 mg (has no administration in time range)  ?thyroid (ARMOUR) tablet 30 mg (has no administration in time range)  ? ? ?Initial Impression and Plan ? Patient here with concern for CRAO. Labs and CT ordered in triage reviewed. CBC is normal, CMP with mild hyperglycemia (she has insulin pump and Dexcom). Coags are neg. Sed rate is neg, low suspicion for GCA. I personally viewed the images from radiology studies and agree with radiologist interpretation: CT head without acute process.  ? ?Spoke with Dr. Lorrin Goodell, Neurology  who will consult, recommend admission for full stroke workup including MRI brain, CTA head/neck. EKG ordered to ensure not currently in afib.  ? ?ED Course  ? ?Clinical Course as of 02/11/22 0454  ?Sun Feb 11, 2022  ?0

## 2022-02-11 NOTE — Assessment & Plan Note (Addendum)
Unspecified type. Continue Cardizem PRN as previously prescribed. ?

## 2022-02-11 NOTE — Consult Note (Signed)
NEUROLOGY CONSULTATION NOTE  ? ?Date of service: February 11, 2022 ?Patient Name: Gina [name] ?MRN:  [id number] ?DOB:  1945-12-30 ?Reason for consult: "L eye CRAO" ?Requesting Provider: Etta Quill, DO ?_ _ _   _ __   _ __ _ _  __ __   _ __   __ _ ? ?History of Present Illness  ?Journe Hallmark is a 76 y.o. female with PMH significant for diabetes, hypertension, hypothyroidism, SIADH, atrial fibrillation status post ablation and on Eliquis 5 mg twice daily and compliant with her Eliquis who presents with sudden onset painless vision loss in her left eye.  She reports that her symptoms started at 4 PM.  Her central vision is worse in the peripheral vision.  She has no pain in her eye.  No redness of her eye.  Reports that the vision loss was very sudden and maximal in onset.  She denies any curtain falling, no redness in her eye.  She denies any floaters.  She was able to get into see ophthalmology and had a dilated eye exam with concerns for left eye CRAO.  She was sent to ED for further evaluation and work-up by neurology. ? ?She endorses compliance with her Eliquis.  Reports her last A1c was 7.2.  Occasionally will be hypertensive at home to 184 systolic.  Denies any history of hyperlipidemia.  She does not smoke, no recreational substances. ? ?LKW: 1600 on 02/10/2022. ?mRS: 0 ?tNKASE: Not offered due to outside the window. ?Thrombectomy: Not offered due to low suspicion for LVO. ?NIHSS components Score: Comment  ?1a Level of Conscious 0$RemoveBefor'[x]'uvJyKUlimtTE$  '[]'$  2$R'[]'aA$  '[]'$      ?1b LOC Questions 0$RemoveBefore'[x]'IGTiausYIoVuj$  '[]'$  2$R'[]'Kg$       ?1c LOC Commands 0$RemoveBefor'[x]'sOqKmHKSAIyj$  '[]'$  2$R'[]'kb$       ?2 Best Gaze 0$RemoveBe'[x]'pficEaBJY$  '[]'$  2$R'[]'Yl$       ?3 Visual 0$RemoveBe'[x]'mnNPHiCRD$  '[]'$  2$R'[]'KG$  '[]'$      ?4 Facial Palsy 0$RemoveBefor'[x]'wXcCdnZQrgAt$  '[]'$  2$R'[]'AV$  '[]'$      ?5a Motor Arm - left 0$Remov'[x]'yPnBKv$  '[]'$  2$R'[]'MQ$  '[]'$  4$R'[]'hE$  U'[]'$    ?5b Motor Arm - Right 0$Remove'[x]'hpkSRbi$  '[]'$  2$R'[]'tZ$  '[]'$  4$R'[]'xm$  U'[]'$    ?6a Motor Leg - Left 0$Remov'[x]'RTAlcf$  '[]'$  2$R'[]'gx$  '[]'$  4$R'[]'XS$  U'[]'$    ?6b Motor Leg - Right 0$Remove'[x]'NNGQPrw$  '[]'$  2$R'[]'VV$  '[]'$  4$R'[]'Vc$  U'[]'$    ?7 Limb Ataxia 0$RemoveBefo'[x]'SEPEgHEWQhb$  '[]'$  2$R'[]'Sx$  '[]'$  UN$R'[]'Sx$     ?8 Sensory 0$RemoveBef'[x]'ECmDcmZMrY$  '[]'$  2$R'[]'lY$  U'[]'$      ?9 Best Language 0$RemoveBefore'[x]'QrOimxYMFBTUN$   1$'[]'f$  '[]'$  3$R'[]'CG$      ?10 Dysarthria 0$RemoveBeforeD'[x]'WkLDxerLCcxTCH$  '[]'$  2$R'[]'Pv$  U'[]'$      ?11 Extinct. and Inattention 0$RemoveBeforeDE'[x]'MrTiPqyYQOfnwjh$  '[]'$  2$R'[]'Qg$       ?TOTAL: 0   ? ?  ?ROS  ? ?Constitutional Denies weight loss, fever and chills.   ?HEENT Left eye vision changes, no changes in hearing.   ?Respiratory Denies SOB and cough.   ?CV Denies palpitations and CP   ?GI Denies abdominal pain, nausea, vomiting and diarrhea.   ?GU Denies dysuria and urinary frequency.   ?MSK Denies myalgia and joint pain.   ?Skin Denies rash and pruritus.   ?Neurological Denies headache and syncope.   ?Psychiatric Denies recent changes in mood. Denies anxiety and depression.   ? ?Past History  ? ?Past Medical History:  ?Diagnosis Date  ? Anxiety   ? new dx  ? Atrial fibrillation ([place])   ? a. 03/2015 s/p RFCA;  b. CHA2DS2VASc = 5-->Eliquis.  ? Breast cancer ([place]) 03/05/12  ? 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  Diabetes mellitus type 1 ([place])   ? Insulin pump  ? Diastolic CHF ([place])   ? a. 12/2014 - due to AF RVR;  b. 03/2015 Echo: EF 60-65%, no rwma.  ? Hyperlipidemia   ? Hypertension   ? Hypothyroidism   ? Insulin pump in place   ? Ovarian cyst   ? S/P radiation therapy 04/10/12- 05/26/2012  ? left Breast and Axilla / 50 gy / 25 Fractions with Left Breast Boost / 10 Gy / 5 Fractions  ? SIADH (syndrome of inappropriate ADH production) ([place])   ? Use of letrozole ([place]) 06/13/12  ? ?Past Surgical History:  ?Procedure Laterality Date  ? APPENDECTOMY    ? BREAST LUMPECTOMY Left 03/05/2012  ? 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 COLONOSCOPY    ? 1 polyp   ? ELECTROPHYSIOLOGIC STUDY N/A 04/08/2015  ? PVI + CTI ablation + posterior Box lesion + Atach ablation by [name]  ? OVARIAN CYST SURGERY    ? TEE WITHOUT CARDIOVERSION N/A 04/07/2015  ? Procedure: TRANSESOPHAGEAL ECHOCARDIOGRAM (TEE);  Surgeon: Thayer Headings, MD;  Location: [facility];  Service: Cardiovascular;  Laterality: N/A;  ? TONSILLECTOMY    ? ?Family History  ?Problem Relation Age of Onset  ? Breast cancer Mother 56  ? Heart failure Mother   ? Prostate cancer Maternal Uncle   ?     died in his 21s  ? Heart attack Father   ? Diabetes Maternal Uncle 15  ? ?Social History  ? ?Socioeconomic History  ? Marital status: Married  ?  Spouse name: Not on file  ? Number of children: Not on file  ? Years of education: Not on file  ? Highest education level: Not on file  ?Occupational History  ? Not on file  ?Tobacco Use  ? Smoking status: Never  ? Smokeless tobacco: Never  ?Substance and Sexual Activity  ? Alcohol use: No  ? Drug use: No  ? Sexual activity: Yes  ?Other Topics Concern  ? Not on file  ?Social History Narrative  ? Not on file  ? ?Social Determinants of Health  ? ?Financial Resource Strain: Not on file  ?Food Insecurity: Not on file  ?Transportation Needs: Not on file  ?Physical Activity: Not on file  ?Stress: Not on file  ?Social Connections: Not on file  ? ?Allergies  ?Allergen Reactions  ? Rosuvastatin Calcium   ?  Headaches, fatigue  ? ? ?Medications  ?(Not in a hospital admission) ?  ? ?Vitals  ? ?Vitals:  ? 02/10/22 2249  ?BP: (!) 203/78  ?Pulse: 74  ?Resp: 18  ?Temp: 98.1 ?F (36.7 ?C)  ?TempSrc: Oral  ?SpO2: 100%  ?  ? ?There is no height or weight on file to calculate BMI. ? ?Physical Exam  ? ?General: Laying comfortably in bed; in no acute distress.  ?HENT: Normal oropharynx and mucosa. Normal external appearance of ears and nose.  ?Neck: Supple, no pain or tenderness  ?CV: No JVD. No peripheral edema.  ?Pulmonary: Symmetric Chest rise. Normal respiratory effort.  ?Abdomen: Soft to touch, non-tender.  ?Ext: No cyanosis, edema, or deformity  ?Skin: No rash. Normal palpation of skin.   ?Musculoskeletal: Normal digits and nails by inspection. No clubbing.  ? ?Neurologic Examination  ?Mental status/Cognition: Alert, oriented to self, place, month  and year, good attention.  ?Speech/language: Fluent, comprehension intact, object naming intact,  repetition intact.  ?Cranial nerves:  ? CN II Left pupil is larger than the right pupil but she also had her left pupil dilated a few hours ago. Left eye monocular vision loss with central vision worse than peripheral vision.  No hemianopsia.  ? CN III,IV,VI EOM intact, no gaze preference or deviation, no nystagmus   ? CN V normal sensation in V1, V2, and V3 segments bilaterally   ? CN VII no asymmetry, no nasolabial fold flattening   ? CN VIII normal hearing to speech   ? CN IX & X normal palatal elevation, no uvular deviation   ? CN XI 5/5 head turn and 5/5 shoulder shrug bilaterally   ? CN XII midline tongue protrusion   ? ?Motor:  ?Muscle bulk: normal, tone normal, pronator drift none tremor none ?Mvmt Root Nerve  Muscle Right Left Comments  ?SA C5/6 Ax Deltoid 5 5   ?EF C5/6 Mc Biceps 5 5   ?EE C6/7/8 Rad Triceps 5 5   ?WF C6/7 Med FCR     ?WE C7/8 PIN ECU     ?F Ab C8/T1 U ADM/FDI 5 5   ?HF L1/2/3 Fem Illopsoas 5 5   ?KE L2/3/4 Fem Quad 5 5   ?DF L4/5 D Peron Tib Ant 5 5   ?PF S1/2 Tibial Grc/Sol 5 5   ? ?Reflexes: ? Right Left Comments  ?Pectoralis     ? Biceps (C5/6) 2 2   ?Brachioradialis (C5/6) 2 2   ? Triceps (C6/7) 2 2   ? Patellar (L3/4) 2 2   ? Achilles (S1)     ? Hoffman     ? Plantar     ?Jaw jerk   ? ?Sensation: ? Light touch Intact throughout  ? Pin prick   ? Temperature   ? Vibration   ?Proprioception   ? ?Coordination/Complex Motor:  ?- Finger to Nose intact bilaterally ?- Heel to shin intact bilaterally ?- Rapid alternating movement are normal ?- Gait: Deferred. ?Labs  ? ?CBC:  ?Recent Labs  ?Lab 02/10/22 ?2310  ?WBC 4.8  ?NEUTROABS 2.2  ?HGB 12.7  ?HCT 37.9  ?MCV 89.0  ?PLT 205  ? ? ?Basic Metabolic Panel:  ?Lab Results  ?Component Value Date  ? NA 133 (L) 02/10/2022  ? K 4.0 02/10/2022  ? CO2 26 02/10/2022  ? GLUCOSE 160 (H) 02/10/2022  ? BUN 11 02/10/2022  ? CREATININE 0.67 02/10/2022  ? CALCIUM  9.2 02/10/2022  ? GFRNONAA >60 02/10/2022  ? GFRAA >60 05/26/2015  ? ?Lipid Panel:  ?Lab Results  ?Component Value Date  ? [place] 99 01/22/2022  ? ?HgbA1c:  ?Lab Results  ?Component Value Date  ? HGBA1C

## 2022-02-11 NOTE — ED Notes (Signed)
ED TO INPATIENT HANDOFF REPORT ? ?ED Nurse Name and Phone #:  ? ?S ?Name/Age/Gender ?Gina [name] ?76 y.o. ?female ?Room/Bed: 039C/039C ? ?Code Status ?  Code Status: Full Code ? ?Home/SNF/Other ?Home ?Patient oriented to: self, place, time, and situation ?Is this baseline? Yes  ? 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 ?Triage Note ?Pt reported to ED from [facility] for MRI of left eye for evaluation of possible stroke. Reports having vision changes to left eye since approximately 3 or 4 pm like seeing "shadow figures", states that symptoms appear to come and go.    ? ?Allergies ?Allergies  ?Allergen Reactions  ? Rosuvastatin Calcium   ?  Headaches, fatigue  ? ? ?Level of Care/Admitting Diagnosis ?ED Disposition   ? ? ED Disposition  ?Admit  ? Condition  ?--  ? Comment  ?Hospital Area: [facility] [[id number]] ? Level of Care: Telemetry Medical [[id number]] ? May place patient in observation at [facility] or [facility] if equivalent level of care is available:: No ? Covid Evaluation: Asymptomatic - no recent exposure (last 10 days) testing not required ? Diagnosis: CRAO (central retinal artery occlusion), left [[id number]] ? Admitting Physician: Etta Quill [[id number]] ? Attending Physician: Etta Quill [phone] ?  ?  ? ?  ? ? ?B ?Medical/Surgery History ?Past Medical History:  ?Diagnosis Date  ? Anxiety   ? new dx  ? Atrial fibrillation ([place])   ? a. 03/2015 s/p RFCA;  b. CHA2DS2VASc = 5-->Eliquis.  ? Breast cancer ([place]) 03/05/12  ? 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 Diabetes mellitus type 1 ([place])   ? Insulin pump  ? Diastolic CHF ([place])   ? a. 12/2014 - due to AF RVR;  b. 03/2015 Echo: EF 60-65%, no rwma.  ? Hyperlipidemia   ? Hypertension   ? Hypothyroidism   ? Insulin pump in place   ? Ovarian cyst   ? S/P radiation therapy 04/10/12-  05/26/2012  ? left Breast and Axilla / 50 gy / 25 Fractions with Left Breast Boost / 10 Gy / 5 Fractions  ? SIADH (syndrome of inappropriate ADH production) ([place])   ? Use of letrozole ([place]) 06/13/12  ? ?Past Surgical History:  ?Procedure Laterality Date  ? APPENDECTOMY    ? BREAST LUMPECTOMY Left 03/05/2012  ? 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 COLONOSCOPY    ? 1 polyp   ? ELECTROPHYSIOLOGIC STUDY N/A 04/08/2015  ? PVI + CTI ablation + posterior Box lesion + Atach ablation by [name]  ? OVARIAN CYST SURGERY    ? TEE WITHOUT CARDIOVERSION N/A 04/07/2015  ? Procedure: TRANSESOPHAGEAL ECHOCARDIOGRAM (TEE);  Surgeon: Thayer Headings, MD;  Location: [facility];  Service: Cardiovascular;  Laterality: N/A;  ? TONSILLECTOMY    ?  ? ?A ?IV Location/Drains/Wounds ?Patient Lines/Drains/Airways Status   ? ? Active Line/Drains/Airways   ? ? Name Placement date Placement time Site Days  ? Peripheral IV 02/11/22 20 G Right Antecubital 02/11/22  0610  Antecubital  less than 1  ? ?  ?  ? ?  ? ? ?Intake/Output Last 24 hours ?No intake or output data in the 24 hours ending 02/11/22 0721 ? ?Labs/Imaging ?Results for orders placed or performed during the hospital encounter of 02/10/22 (from  the past 48 hour(s))  ?Comprehensive metabolic panel     Status: Abnormal  ? Collection Time: 02/10/22 11:10 PM  ?Result Value Ref Range  ? Sodium 133 (L) 135 - 145 mmol/L  ? Potassium 4.0 3.5 - 5.1 mmol/L  ? Chloride 98 98 - 111 mmol/L  ? CO2 26 22 - 32 mmol/L  ? Glucose, Bld 160 (H) 70 - 99 mg/dL  ?  Comment: Glucose reference range applies only to samples taken after fasting for at least 8 hours.  ? BUN 11 8 - 23 mg/dL  ? Creatinine, Ser 0.67 0.44 - 1.00 mg/dL  ? Calcium 9.2 8.9 - 10.3 mg/dL  ? Total Protein 6.7 6.5 - 8.1 g/dL  ? Albumin 3.7 3.5 - 5.0 g/dL  ? AST 25 15 - 41 U/L  ? ALT 23 0 - 44 U/L  ? Alkaline Phosphatase 85 38 - 126  U/L  ? Total Bilirubin 0.7 0.3 - 1.2 mg/dL  ? GFR, Estimated >60 >60 mL/min  ?  Comment: (NOTE) ?Calculated using the CKD-EPI Creatinine Equation (2021) ?  ? Anion gap 9 5 - 15  ?  Comment: Performed at [facility], [place] [address], [place], [place] [address]  ?CBC with Differential     Status: None  ? Collection Time: 02/10/22 11:10 PM  ?Result Value Ref Range  ? WBC 4.8 4.0 - 10.5 K/uL  ? RBC 4.26 3.87 - 5.11 MIL/uL  ? Hemoglobin 12.7 12.0 - 15.0 g/dL  ? HCT 37.9 36.0 - 46.0 %  ? MCV 89.0 80.0 - 100.0 fL  ? MCH 29.8 26.0 - 34.0 pg  ? MCHC 33.5 30.0 - 36.0 g/dL  ? RDW 13.6 11.5 - 15.5 %  ? Platelets 205 150 - 400 K/uL  ? nRBC 0.0 0.0 - 0.2 %  ? Neutrophils Relative % 47 %  ? Neutro Abs 2.2 1.7 - 7.7 K/uL  ? Lymphocytes Relative 38 %  ? Lymphs Abs 1.8 0.7 - 4.0 K/uL  ? Monocytes Relative 12 %  ? Monocytes Absolute 0.6 0.1 - 1.0 K/uL  ? Eosinophils Relative 2 %  ? Eosinophils Absolute 0.1 0.0 - 0.5 K/uL  ? Basophils Relative 1 %  ? Basophils Absolute 0.1 0.0 - 0.1 K/uL  ? Immature Granulocytes 0 %  ? Abs Immature Granulocytes 0.02 0.00 - 0.07 K/uL  ?  Comment: Performed at [facility], [place] [address] [place], [place] [address]  ?Protime-INR     Status: None  ? Collection Time: 02/10/22 11:10 PM  ?Result Value Ref Range  ? Prothrombin Time 13.8 11.4 - 15.2 seconds  ? INR 1.1 0.8 - 1.2  ?  Comment: (NOTE) ?INR goal varies based on device and disease states. ?Performed at [facility], [place] [address], [place], [place] ?[address] ?  ?APTT     Status: None  ? Collection Time: 02/10/22 11:10 PM  ?Result Value Ref Range  ? aPTT 32 24 - 36 seconds  ?  Comment: Performed at [facility], [place] [address] [place], [place] [address]  ?Sedimentation rate     Status: None  ? Collection Time: 02/10/22 11:10 PM  ?Result Value Ref Range  ? Sed Rate 9 0 - 22 mm/hr  ?  Comment: Performed at [facility], [place] [address] [place], [place] [address]  ?Lipid panel     Status: Abnormal  ? Collection Time:  02/10/22 11:10 PM  ?Result Value Ref Range  ?  Cholesterol 203 (H) 0 - 200 mg/dL  ? Triglycerides 47 <150 mg/dL  ? HDL 81 >40 mg/dL  ? Total CHOL/HDL Ratio 2.5 RATIO  ? VLDL 9 0 - 40 mg/dL  ? LDL Cholesterol 113 (H) 0 - 99 mg/dL  ?  Comment:        ?Total Cholesterol/HDL:CHD Risk ?Coronary Heart Disease Risk Table ?                    Men   Women ? 1/2 Average Risk   3.4   3.3 ? Average Risk       5.0   4.4 ? 2 X Average Risk   9.6   7.1 ? 3 X Average Risk  23.4   11.0 ?       ?Use the calculated Patient Ratio ?above and the CHD Risk Table ?to determine the patient's CHD Risk. ?       ?ATP III CLASSIFICATION (LDL): ? <100     mg/dL   Optimal ? 100-129  mg/dL   Near or Above ?                   Optimal ? 130-159  mg/dL   Borderline ? 160-189  mg/dL   High ? >190     mg/dL   Very High ?Performed at [facility], [address] [place], [place] [address] ?  ?CBG monitoring, ED     Status: Abnormal  ? Collection Time: 02/11/22  3:33 AM  ?Result Value Ref Range  ? Glucose-Capillary 132 (H) 70 - 99 mg/dL  ?  Comment: Glucose reference range applies only to samples taken after fasting for at least 8 hours.  ? ?CT ANGIO HEAD NECK W WO CM ? ?Result Date: 02/11/2022 ?CLINICAL DATA:  75-year-old female with left eye vision loss. No acute finding on brain MRI 0213 hours today. EXAM: CT ANGIOGRAPHY HEAD AND NECK TECHNIQUE: Multidetector CT imaging of the head and neck was performed using the standard protocol during bolus administration of intravenous contrast. Multiplanar CT image reconstructions and MIPs were obtained to evaluate the vascular anatomy. Carotid stenosis measurements (when applicable) are obtained utilizing NASCET criteria, using the distal internal carotid diameter as the denominator. RADIATION DOSE REDUCTION: This exam was performed according to the departmental dose-optimization program which includes automated exposure control, adjustment of the mA and/or kV according to patient size and/or use of  iterative reconstruction technique. CONTRAST:  100mL OMNIPAQUE IOHEXOL 350 MG/ML SOLN COMPARISON:  Brain MRI 0213 hours and plain head CT 0009 hours today. CTA chest 01/13/2015. FINDINGS: CTA NECK Skeleton: [place]

## 2022-02-11 NOTE — Evaluation (Signed)
Occupational Therapy Evaluation ?Patient Details ?Name: Gina [name] ?MRN: [id number] ?DOB: [birth year] ?Today's Date: 02/11/2022 ? ? ?History of Present Illness The pt is a 76 yo female presenting 4/29 from Opthalmologist due to acute vision changes in her left eye. 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 PMH includes: DM I, HTN, breast cancer s/p radiation, CHF, hypothyroidism, SIADH, afib s/p ablation.  ? ?Clinical Impression ?  ?Pt admitted for concerns listed above. PTA pt reported that she was independent with all ADL's and IADL's, including driving. At this time, physically pt is at her baseline, able to complete BADL's and functional mobility with no assist or AD. Her vision in her L eye is severely impacted from her normal, as she is only seeing shapes and intermittently seeing some color. Despite vision changes, pt is compensating well, scanning her environment, and overall requiring no cuing for safety. OT recommended pt follow up with MD about driving, as well as follow up with opthamologist throughout this process. Pt has no further OT needs and acute OT will sign off.   ?   ? ?Recommendations for follow up therapy are one component of a multi-disciplinary discharge planning process, led by the attending physician.  Recommendations may be updated based on patient status, additional functional criteria and insurance authorization.  ? ?Follow Up Recommendations ? No OT follow up  ?  ?Assistance Recommended at Discharge None  ?Patient can return home with the following Assistance with cooking/housework;Assist for transportation ? ?  ?Functional Status Assessment ? Patient has had a recent decline in their functional status and demonstrates the ability to make significant improvements in function in a reasonable and predictable amount of time.  ?Equipment Recommendations ? None recommended by OT  ?  ?Recommendations for Other Services   ? ? ?  ?Precautions / Restrictions  Precautions ?Precautions: None ?Restrictions ?Weight Bearing Restrictions: No  ? ?  ? ?Mobility Bed Mobility ?Overal bed mobility: Independent ?  ?  ?  ?  ?  ?  ?  ?  ? ?Transfers ?Overall transfer level: Independent ?Equipment used: None ?  ?  ?  ?  ?  ?  ?  ?  ?  ? ?  ?Balance Overall balance assessment: Independent ?  ?  ?  ?  ?  ?  ?  ?  ?  ?  ?  ?  ?  ?  ?  ?  ?  ?  ?   ? ?ADL either performed or assessed with clinical judgement  ? ?ADL Overall ADL's : At baseline;Modified independent ?  ?  ?  ?  ?  ?  ?  ?  ?  ?  ?  ?  ?  ?  ?  ?  ?  ?  ?  ?General ADL Comments: Pt safe and independent with all ADL's and fucntional mobility. Compensating well for vision changes  ? ? ? ?Vision Baseline Vision/History: 1 Wears glasses ?Ability to See in Adequate Light: 0 Adequate ?Patient Visual Report: Other (comment) (Vision dark, sees shadows and sometimes colors) ?Vision Assessment?: Vision impaired- to be further tested in functional context ?Additional Comments: L eye, pt sees only shadows/figures, and sometimes colors. Pt compensating well, with no difficulties. able to track, scan her environment, and has good safety awareness during tasks to compensate for vision  ?   ?Perception   ?  ?Praxis   ?  ? ?Pertinent Vitals/Pain Pain Assessment ?Pain Assessment: 0-10 ?Pain Score: 2  ?Pain Location:  headache ?Pain Descriptors / Indicators: Headache ?Pain Intervention(s): Monitored during session  ? ? ? ?Hand Dominance Left ?  ?Extremity/Trunk Assessment Upper Extremity Assessment ?Upper Extremity Assessment: Overall WFL for tasks assessed ?  ?Lower Extremity Assessment ?Lower Extremity Assessment: Overall WFL for tasks assessed ?  ?Cervical / Trunk Assessment ?Cervical / Trunk Assessment: Normal ?  ?Communication Communication ?Communication: No difficulties ?  ?Cognition Arousal/Alertness: Awake/alert ?Behavior During Therapy: [facility] for tasks assessed/performed ?Overall Cognitive Status: Within Functional Limits for tasks  assessed ?  ?  ?  ?  ?  ?  ?  ?  ?  ?  ?  ?  ?  ?  ?  ?  ?  ?  ?  ?General Comments  VSS on RA ? ?  ?Exercises   ?  ?Shoulder Instructions    ? ? ?Home Living Family/patient expects to be discharged to:: Private residence ?Living Arrangements: Spouse/significant other ?Available Help at Discharge: Family ?Type of Home: House ?  ?  ?  ?  ?  ?  ?  ?  ?  ?  ?  ?  ?  ?  ?  ? ?  ?Prior Functioning/Environment Prior Level of Function : Independent/Modified Independent;Driving ?  ?  ?  ?  ?  ?  ?  ?  ?  ? ?  ?  ?OT Problem List: Impaired vision/perception;Decreased coordination;Decreased safety awareness ?  ?   ?OT Treatment/Interventions:    ?  ?OT Goals(Current goals can be found in the care plan section) Acute Rehab OT Goals ?Patient Stated Goal: To get back to her baseline ?OT Goal Formulation: All assessment and education complete, DC therapy ?Time For Goal Achievement: 02/11/22 ?Potential to Achieve Goals: Good  ?OT Frequency:   ?  ? ?Co-evaluation   ?  ?  ?  ?  ? ?  ?AM-PAC OT "6 Clicks" Daily Activity     ?Outcome Measure Help from another person eating meals?: None ?Help from another person taking care of personal grooming?: None ?Help from another person toileting, which includes using toliet, bedpan, or urinal?: None ?Help from another person bathing (including washing, rinsing, drying)?: None ?Help from another person to put on and taking off regular upper body clothing?: None ?Help from another person to put on and taking off regular lower body clothing?: None ?6 Click Score: 24 ?  ?End of Session Nurse Communication: Mobility status ? ?Activity Tolerance: Patient tolerated treatment well ?Patient left: in bed;with call bell/phone within reach;with family/visitor present ? ?OT Visit Diagnosis: Other (comment) (Vision changes in L eye)  ?              ?Time: 0737-1062 ?OT Time Calculation (min): 14 min ?Charges:  OT General Charges ?$OT Visit: 1 Visit ?OT Evaluation ?$OT Eval Moderate Complexity: 1 Mod ? ?Gina [name]  H., OTR/L ?Acute Rehabilitation ? ?Gina [name] ?02/11/2022, 12:19 PM ?

## 2022-02-11 NOTE — Assessment & Plan Note (Addendum)
Resume home losartan on discharge. ?

## 2022-02-11 NOTE — Discharge Summary (Signed)
?Physician Discharge Summary ?  ?Patient: Gina [name] MRN: [id number] DOB: Jul 17, 1946  ?Admit date:     02/10/2022  ?Discharge date: 02/11/22  ?Discharge Physician: Cordelia Poche, MD  ? ?PCP: Susy Frizzle, MD  ? ?Recommendations at discharge:  ? ?Follow-up with neurology and PCP ? ?Discharge Diagnoses: ?Principal Problem: ?  CRAO (central retinal artery occlusion), left ?Active Problems: ?  Dyslipidemia ?  Chronic anticoagulation-Eliquis ?  Essential hypertension ?  Diabetes type 1, controlled ([place]) ?  Hypothyroidism ?  A-fib ([place]) ?  Hyponatremia ? ?Resolved Problems: ?  * No resolved hospital problems. * ? ?Admission HPI (written by Jennette Kettle, DO): ? ?HPI: Gina [name] is a 76 y.o. female with medical history significant of DM1, PAF on eliquis, HTN, HLD.  SIADH with chronic mild hyponatremia, diet controlled. ?  ?Pt had sudden onset L eye vision loss, now only light perception.  Symptoms onset around 3pm.  Pt went in to optho office.  Optho sent her in to the ED after evaluation with concern that she most likely has CRAO of the L eye ? ?Assessment and Plan: ?* CRAO (central retinal artery occlusion), left ?Painless monocular vision loss. Neurology consulted. MRI confirms no acute CVA. CTA head and neck without large vessel occlusion. LDL of 113. Hemoglobin A1C of 7.% from earlier this month. Transthoracic Echocardiogram significant for no atrial thrombus and no interatrial shunt. Per neurology, likely not embolic but rather intrinsic vessel narrowing. Neurology recommendations for addition of aspirin 81 mg to patient's regimen of Eliquis, in addition to the addition of a low-dose statin since patient has a prior intolerance to medium intensity; patient declines aspirin and statin recommendations. PT/OT recommendations for no follow-up. Patient discharged on no new medications since she declined initiation of aspirin and statin. Recommendation for Neurology follow-up in 4 weeks. ? ?Chronic  anticoagulation-Eliquis ?Continue Eliquis ? ?Dyslipidemia ?LDL of 113 currently. History of Crestor use about 5 years prior. History of intolerance in addition to patient stating she read the side affects and did not agree with using the medication. Recommendation to at least start a low-dose statin this admission, but patient declined. ? ?Hyponatremia ?Chronic. Stable. ? ?A-fib ([place]) ?Unspecified type. Continue Cardizem PRN as previously prescribed. ? ?Hypothyroidism ?Continue thyroid tablet. ? ?Diabetes type 1, controlled ([place]) ?Managed on insulin pump. Hemoglobin A1C of 7.2%. Continue insulin pump on discharge. ? ?Essential hypertension ?Resume home losartan on discharge. ? ? ? ? ?  ? ? ?Consultants: Neurology ?Procedures performed: Transthoracic Echocardiogram  ?Disposition: Home ?Diet recommendation:  ?Cardiac and Carb modified diet ? ?DISCHARGE MEDICATION: ?Allergies as of 02/11/2022   ? ?   Reactions  ? Rosuvastatin Calcium   ? Headaches, fatigue  ? ?  ? ?  ?Medication List  ?  ? ?TAKE these medications   ? ?ASHWAGANDHA PO ?Take 380 mg by mouth daily. ?  ?CoQ10 200 MG Caps ?Take 200 mg by mouth daily. ?  ?Dexcom G6 Sensor Misc ?by Does not apply route. ?  ?Dexcom G6 Transmitter Misc ?by Does not apply route. ?  ?diltiazem 30 MG tablet ?Commonly known as: Cardizem ?Take 1 tablet every 4 hours AS NEEDED for HR >100 as long as top BP >100. ?What changed:  ?how much to take ?how to take this ?when to take this ?reasons to take this ?additional instructions ?  ?Eliquis 5 MG Tabs tablet ?Generic drug: apixaban ?TAKE 1 TABLET BY MOUTH TWICE A DAY ?What changed: how much to take ?  ?ezetimibe 10 MG tablet ?  Commonly known as: ZETIA ?TAKE 1 TABLET BY MOUTH EVERY DAY ?  ?GLUCOSAMINE CHOND MSM FORMULA PO ?Take 15 mLs by mouth daily. ?  ?glucose blood test strip ?Commonly known as: ONE TOUCH ULTRA TEST ?Use asto check blood sugar 5 times a day ?  ?losartan 25 MG tablet ?Commonly known as: COZAAR ?TAKE 1 TABLET BY MOUTH  EVERY DAY ?  ?MULTI VITAMIN PO ?Take 300 mg by mouth daily in the afternoon. Exipure- supports healthy weight loss- ?  ?MULTIVITAMIN PO ?Take 12.5 mg by mouth daily. Iodoral- ?  ?MULTIVITAMIN PO ?Take 50 mcg by mouth daily. Cholestar- Vitamin K27- ?  ?MULTIVITAMIN ADULT PO ?Take 1 tablet by mouth daily. Memo Max Pro- ?  ?MULTIVITAMIN ADULT PO ?Take 1,000 mg by mouth in the morning and at bedtime. - Estrogen supplement ?  ?NON FORMULARY ?Systemic Formulas Pancreas supplement, 1 per day ?"                    "           Metabo-Shake (Glycemic Support) 1 scoop per day ?Reishi w/ Curcumin 1 per day ?  ?NON FORMULARY ?Systemic Formulas Glycemic Balance, 2 per day ?"                  "             Eyes supplement, 2 per day ?"                  "              ROX (Antioxidant w/ Reservatrol), 1 per day ?  ?NON FORMULARY ?Take 15 mLs by mouth daily. MAG CAL VANILLA LIQUID ?  ?NON FORMULARY ?Take 2 capsules by mouth every morning. Keraflex- ?  ?NovoLOG 100 UNIT/ML injection ?Generic drug: insulin aspart ?USE MAX OF 60 UNITS DAILY VIA INSULIN PUMP. ?What changed: See the new instructions. ?  ?RED YEAST RICE PO ?Take 1,200 mg by mouth daily in the afternoon. ?  ?RESVERATROL PO ?Take 960 mg by mouth daily. ?  ?SUPER OMEGA-3 PO ?Take 2 capsules by mouth daily. ?  ?thyroid 30 MG tablet ?Commonly known as: ARMOUR ?Take 1 tablet daily along with the 15 mg tablet for a total of 45 mg daily ?What changed:  ?how much to take ?how to take this ?when to take this ?additional instructions ?  ?thyroid 15 MG tablet ?Commonly known as: Armour Thyroid ?TAKE 1 TABLET DAILY WITH THE 30 MG TABLET (TOTAL OF 45 MG DAILY) ?What changed:  ?how much to take ?how to take this ?when to take this ?additional instructions ?  ?Turmeric Curcumin 500 MG Caps ?Take 482 mg by mouth daily. ?  ?Vitamin B-12 5000 MCG Subl ?Place 5,000 mcg under the tongue daily. ?  ?vitamin C 1000 MG tablet ?Take 1,000 mg by mouth daily. ?  ?VITAMIN D PO ?Take 1 drop by mouth  daily. vitamin d3 plus k2 ?  ? ?  ? ? Follow-up Information   ? ? [facility]. Schedule an appointment as soon as possible for a visit in 4 week(s).   ?Specialty: Neurology ?Why: For hospital follow-up ?Contact information: ?[place] ?[phone] ? ?  ?  ? ?  ?  ? ?  ? ?Discharge Exam: ?BP (!) 189/78 (BP Location: Right Arm)   Pulse 76   Temp 98.3 ?F (36.8 ?C) (Oral)   Resp 20  SpO2 99%  ? ?General exam: Appears calm and comfortable ?Respiratory system: Clear to auscultation. Respiratory effort normal. ?Cardiovascular system: S1 & S2 heard, RRR. No murmurs, rubs, gallops or clicks. ?Gastrointestinal system: Abdomen is nondistended, soft and nontender. No organomegaly or masses felt. Normal bowel sounds heard. ?Central nervous system: Alert and oriented. Left eye vision impairment with loss of left peripheral vision and ability to see light/shadow and shapes. ?Musculoskeletal: No edema. No calf tenderness ?Skin: No cyanosis. No rashes ?Psychiatry: Judgement and insight appear normal. Mood & affect appropriate.  ? ?Condition at discharge: stable ? ?The results of significant diagnostics from this hospitalization (including imaging, microbiology, ancillary and laboratory) are listed below for reference.  ? ?Imaging Studies: ?CT ANGIO HEAD NECK W WO CM ? ?Result Date: 02/11/2022 ?CLINICAL DATA:  76 year old female with left eye vision loss. No acute finding on brain MRI 0213 hours today. EXAM: CT ANGIOGRAPHY HEAD AND NECK TECHNIQUE: Multidetector CT imaging of the head and neck was performed using the standard protocol during bolus administration of intravenous contrast. Multiplanar CT image reconstructions and MIPs were obtained to evaluate the vascular anatomy. Carotid stenosis measurements (when applicable) are obtained utilizing NASCET criteria, using the distal internal carotid diameter as the denominator. RADIATION DOSE REDUCTION: This exam  was performed according to the departmental dose-optimization program which includes automated exposure control, adjustment of the mA and/or kV according to patient size and/or use of iterative reconstruction technique.

## 2022-02-11 NOTE — Assessment & Plan Note (Signed)
-   Continue Eliquis 

## 2022-02-11 NOTE — H&P (Addendum)
?History and Physical  ? ? ?Patient: Gina [name] ZOX:[id number] DOB: [birth year] ?DOA: 02/10/2022 ?DOS: the patient was seen and examined on 02/11/2022 ?PCP: Susy Frizzle, MD  ?Patient coming from: Home ? ?Chief Complaint:  ?Chief Complaint  ?Patient presents with  ? Eye Problem  ? ?HPI: Gina [name] is a 76 y.o. female with medical history significant of DM1, PAF on eliquis, HTN, HLD.  SIADH with chronic mild hyponatremia, diet controlled. ? ?Pt had sudden onset L eye vision loss, now only light perception.  Symptoms onset around 3pm.  Pt went in to optho office.  Optho sent her in to the ED after evaluation with concern that she most likely has CRAO of the L eye: ? ? ? ?Pt denies headache, eye pain, or other symptoms. ? ?Review of Systems: As mentioned in the history of present illness. All other systems reviewed and are negative. ?Past Medical History:  ?Diagnosis Date  ? Anxiety   ? new dx  ? Atrial fibrillation ([place])   ? a. 03/2015 s/p RFCA;  b. CHA2DS2VASc = 5-->Eliquis.  ? Breast cancer ([place]) 03/05/12  ? 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 Diabetes mellitus type 1 ([place])   ? Insulin pump  ? Diastolic CHF ([place])   ? a. 12/2014 - due to AF RVR;  b. 03/2015 Echo: EF 60-65%, no rwma.  ? Hyperlipidemia   ? Hypertension   ? Hypothyroidism   ? Insulin pump in place   ? Ovarian cyst   ? S/P radiation therapy 04/10/12- 05/26/2012  ? left Breast and Axilla / 50 gy / 25 Fractions with Left Breast Boost / 10 Gy / 5 Fractions  ? SIADH (syndrome of inappropriate ADH production) ([place])   ? Use of letrozole ([place]) 06/13/12  ? ?Past Surgical History:  ?Procedure Laterality Date  ? APPENDECTOMY    ? BREAST LUMPECTOMY Left 03/05/2012  ? 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 COLONOSCOPY    ? 1 polyp   ? ELECTROPHYSIOLOGIC STUDY N/A  04/08/2015  ? PVI + CTI ablation + posterior Box lesion + Atach ablation by [name]  ? OVARIAN CYST SURGERY    ? TEE WITHOUT CARDIOVERSION N/A 04/07/2015  ? Procedure: TRANSESOPHAGEAL ECHOCARDIOGRAM (TEE);  Surgeon: Thayer Headings, MD;  Location: [facility];  Service: Cardiovascular;  Laterality: N/A;  ? TONSILLECTOMY    ? ?Social History:  reports that she has never smoked. She has never used smokeless tobacco. She reports that she does not drink alcohol and does not use drugs. ? ?Allergies  ?Allergen Reactions  ? Rosuvastatin Calcium   ?  Headaches, fatigue  ? ? ?Family History  ?Problem Relation Age of Onset  ? Breast cancer Mother 48  ? Heart failure Mother   ? Prostate cancer Maternal Uncle   ?     died in his 56s  ? Heart attack Father   ? Diabetes Maternal Uncle 15  ? ? ?Prior to Admission medications   ?Medication Sig Start Date End Date Taking? Authorizing Provider  ?apixaban (ELIQUIS) 5 MG TABS tablet TAKE 1 TABLET BY MOUTH TWICE A DAY ?Patient taking differently: Take 5 mg by mouth 2 (two) times daily. 09/14/21   AllredJeneen Rinks, MD  ?Ascorbic Acid (VITAMIN C) 1000 MG tablet Take 1,000 mg by mouth daily.     [provider]  ?  ASHWAGANDHA PO Take 380 mg by mouth daily.    [provider]  ?Cholecalciferol (VITAMIN D PO) Take 1 drop by mouth daily. vitamin d3 plus k2    [provider]  ?Coenzyme Q10 (COQ10) 200 MG CAPS Take 200 mg by mouth daily.    [provider]  ?Continuous Blood Gluc Sensor (DEXCOM G6 SENSOR) MISC by Does not apply route.    [provider]  ?Continuous Blood Gluc Transmit (DEXCOM G6 TRANSMITTER) MISC by Does not apply route.    [provider]  ?Cyanocobalamin (VITAMIN B-12) 5000 MCG SUBL Place 5,000 mcg under the tongue daily.    [provider]  ?diltiazem (CARDIZEM) 30 MG tablet Take 1 tablet every 4 hours AS NEEDED for HR >100 as long as top BP >100. ?Patient taking differently: Take 30 mg by mouth every 4 (four)  hours as needed (HR>100 as long as top BP>100). 02/01/22   Sherran Needs, NP  ?ezetimibe (ZETIA) 10 MG tablet TAKE 1 TABLET BY MOUTH EVERY DAY ?Patient taking differently: Take 10 mg by mouth daily. 10/17/21   Elayne Snare, MD  ?glucose blood (ONE TOUCH ULTRA TEST) test strip Use asto check blood sugar 5 times a day 11/07/16   Elayne Snare, MD  ?losartan (COZAAR) 25 MG tablet TAKE 1 TABLET BY MOUTH EVERY DAY ?Patient taking differently: Take 25 mg by mouth daily. 10/02/21   Sherran Needs, NP  ?Misc Natural Products (GLUCOSAMINE CHOND MSM FORMULA PO) Take 15 mLs by mouth daily.    [provider]  ?Multiple Vitamin (MULTI VITAMIN PO) Take 300 mg by mouth daily in the afternoon. Exipure- supports healthy weight loss-    [provider]  ?Multiple Vitamin (MULTIVITAMIN ADULT PO) Take 1 tablet by mouth daily. Memo Max Pro-    [provider]  ?Multiple Vitamin (MULTIVITAMIN ADULT PO) Take 1,000 mg by mouth in the morning and at bedtime. - Estrogen supplement    [provider]  ?Multiple Vitamin (MULTIVITAMIN PO) Take 12.5 mg by mouth daily. Iodoral-    [provider]  ?Multiple Vitamin (MULTIVITAMIN PO) Take 50 mcg by mouth daily. Cholestar- Vitamin K27-    [provider]  ?NON FORMULARY Systemic Formulas Pancreas supplement, 1 per day ?"                    "           Metabo-Shake (Glycemic Support) 1 scoop per day ?Reishi w/ Curcumin 1 per day    [provider]  ?NON FORMULARY Systemic Formulas Glycemic Balance, 2 per day ?"                  "             Eyes supplement, 2 per day ?"                  "              ROX (Antioxidant w/ Reservatrol), 1 per day    [provider]  ?NON FORMULARY Take 15 mLs by mouth daily. MAG CAL VANILLA LIQUID    [provider]  ?NON FORMULARY Take 2 capsules by mouth every morning. Keraflex-    [provider]  ?NOVOLOG 100 UNIT/ML injection USE MAX OF 60 UNITS DAILY VIA INSULIN PUMP.  09/06/21   Elayne Snare, MD  ?Omega-3 Fatty Acids (SUPER OMEGA-3 PO) Take 2 capsules by mouth daily.  [provider]  ?Red Yeast Rice Extract (RED YEAST RICE PO) Take 1,200 mg by mouth daily in the afternoon.    [provider]  ?RESVERATROL PO Take 960 mg by mouth daily.    [provider]  ?thyroid (ARMOUR THYROID) 15 MG tablet TAKE 1 TABLET DAILY WITH THE 30 MG TABLET (TOTAL OF 45 MG DAILY) ?Patient taking differently: Take 15 mg by mouth daily. Along with 30 mg tab to equal 45 mg qd 01/03/22   Elayne Snare, MD  ?thyroid (ARMOUR) 30 MG tablet Take 1 tablet daily along with the 15 mg tablet for a total of 45 mg daily ?Patient taking differently: Take 30 mg by mouth daily before breakfast. Along with 15 mg to equal 45 mg qd 02/20/21   Elayne Snare, MD  ?Turmeric Curcumin 500 MG CAPS Take 482 mg by mouth daily.    [provider]  ? ? ?Physical Exam: ?Vitals:  ? 02/10/22 2249  ?BP: (!) 203/78  ?Pulse: 74  ?Resp: 18  ?Temp: 98.1 ?F (36.7 ?C)  ?TempSrc: Oral  ?SpO2: 100%  ? ?Constitutional: NAD, calm, comfortable ?Eyes: PERRL, lids and conjunctivae normal ?ENMT: R eye visual acuity grossly normal, L eye dilated (either from optho office exam or fixed dilated), L perception only in L eye. ?Neck: normal, supple, no masses, no thyromegaly ?Respiratory: clear to auscultation bilaterally, no wheezing, no crackles. Normal respiratory effort. No accessory muscle use.  ?Cardiovascular: Regular rate and rhythm, no murmurs / rubs / gallops. No extremity edema. 2+ pedal pulses. No carotid bruits.  ?Abdomen: no tenderness, no masses palpated. No hepatosplenomegaly. Bowel sounds positive.  ?Musculoskeletal: no clubbing / cyanosis. No joint deformity upper and lower extremities. Good ROM, no contractures. Normal muscle tone.  ?Skin: no rashes, lesions, ulcers. No induration ?Neurologic: CN 2-12 grossly intact. Sensation intact, DTR normal. Strength 5/5 in all 4.  ?Psychiatric: Normal judgment and  insight. Alert and oriented x 3. Normal mood.  ? ?Data Reviewed: ? ?ESR 9 ? ?CT head neg for acute findings. ? ?Assessment and Plan: ?* CRAO (central retinal artery occlusion), left ?Pt with sudden onset painles

## 2022-02-11 NOTE — Assessment & Plan Note (Addendum)
Managed on insulin pump. Hemoglobin A1C of 7.2%. Continue insulin pump on discharge. ?

## 2022-02-13 ENCOUNTER — Telehealth: Payer: Self-pay | Admitting: *Deleted

## 2022-02-13 ENCOUNTER — Ambulatory Visit (INDEPENDENT_AMBULATORY_CARE_PROVIDER_SITE_OTHER): Payer: PPO

## 2022-02-13 ENCOUNTER — Encounter: Payer: Self-pay | Admitting: *Deleted

## 2022-02-13 DIAGNOSIS — I4891 Unspecified atrial fibrillation: Secondary | ICD-10-CM

## 2022-02-13 DIAGNOSIS — H349 Unspecified retinal vascular occlusion: Secondary | ICD-10-CM | POA: Diagnosis not present

## 2022-02-13 NOTE — Progress Notes (Unsigned)
Enrolled patient for a 14 day Zio XT  monitor to be mailed to patients home  °

## 2022-02-13 NOTE — Progress Notes (Signed)
?Cardiology Office Note:   ? ?Date:  02/14/2022  ? ?ID:  [name], DOB 10-09-1946, MRN [id number] ? ?PCP:  Elayne Snare, MD  ?Cardiologist:  Sinclair Grooms, MD  ? ?Referring MD: No ref. provider found  ? ?Chief Complaint  ?Patient presents with  ? Atrial Fibrillation  ? Advice Only  ?  Amaurosis fugax ?Central retinal artery occlusion 02/11/2022  ? ? ?History of Present Illness:   ? ?Gina [name] is a 76 y.o. female with a hx of PAF, RFCA 2016, DM II, chronic diastolic heart failure, hyperlipidemia, primary hypertension, being seen because of central retinal artery occlusion and concern for cardio-embolic etiology. Primary patient of Dr. Rayann Heman. ? ?Phone note setting up this visit: ?"Per DOD Dr. Johney Frame, pt needs to be ordered to have a 14 day zio for history of CRAO (Central retinal artery occlusion) and afib.  ?Order placed and pt aware that this will be mailed to her address on file.  Will send her zio instructions to her active mychart account.  Will make monitor techs aware to enroll the pt and mail this out.  ?Pt verbalized understanding and agrees with this plan. ?Will route this information to Dr. Rayann Heman and covering RN as an Juluis Rainier, to make them aware of this plan." ? ?Prior to seeing the ophthalmologist in his office yesterday, the patient was admitted to the hospital for stroke work-up.  Yesterday in preparation and in the process of going to Dr. Romeo Apple office, the patient had near syncope.  No associated chest pain.  She has had near syncope with prodromal complaints.  She feels fine today.  She is scheduled to wear a monitor.  Stroke work-up has been negative. ? ?Past Medical History:  ?Diagnosis Date  ? Anxiety   ? new dx  ? Atrial fibrillation ([place])   ? a. 03/2015 s/p RFCA;  b. CHA2DS2VASc = 5-->Eliquis.  ? Breast cancer ([place]) 03/05/12  ? 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 Diabetes mellitus type 1 ([place])    ? Insulin pump  ? Diastolic CHF ([place])   ? a. 12/2014 - due to AF RVR;  b. 03/2015 Echo: EF 60-65%, no rwma.  ? Hyperlipidemia   ? Hypertension   ? Hypothyroidism   ? Insulin pump in place   ? Ovarian cyst   ? S/P radiation therapy 04/10/12- 05/26/2012  ? left Breast and Axilla / 50 gy / 25 Fractions with Left Breast Boost / 10 Gy / 5 Fractions  ? SIADH (syndrome of inappropriate ADH production) ([place])   ? Use of letrozole ([facility]) 06/13/12  ? ? ?Past Surgical History:  ?Procedure Laterality Date  ? APPENDECTOMY    ? BREAST LUMPECTOMY Left 03/05/2012  ? 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 COLONOSCOPY    ? 1 polyp   ? ELECTROPHYSIOLOGIC STUDY N/A 04/08/2015  ? PVI + CTI ablation + posterior Box lesion + Atach ablation by [name]  ? OVARIAN CYST SURGERY    ? TEE WITHOUT CARDIOVERSION N/A 04/07/2015  ? Procedure: TRANSESOPHAGEAL ECHOCARDIOGRAM (TEE);  Surgeon: Thayer Headings, MD;  Location: [facility];  Service: Cardiovascular;  Laterality: N/A;  ? TONSILLECTOMY    ? ? ?Current Medications: ?Current Meds  ?Medication Sig  ? apixaban (ELIQUIS) 5 MG TABS tablet TAKE 1 TABLET BY MOUTH TWICE A DAY (Patient taking differently:  Take 5 mg by mouth 2 (two) times daily.)  ? Ascorbic Acid (VITAMIN C) 1000 MG tablet Take 1,000 mg by mouth daily.   ? ASHWAGANDHA PO Take 380 mg by mouth daily.  ? Cholecalciferol (VITAMIN D PO) Take 1 drop by mouth daily. vitamin d3 plus k2  ? Coenzyme Q10 (COQ10) 200 MG CAPS Take 200 mg by mouth daily.  ? Continuous Blood Gluc Sensor (DEXCOM G6 SENSOR) MISC by Does not apply route.  ? Continuous Blood Gluc Transmit (DEXCOM G6 TRANSMITTER) MISC by Does not apply route.  ? Cyanocobalamin (VITAMIN B-12) 5000 MCG SUBL Place 5,000 mcg under the tongue daily.  ? diltiazem (CARDIZEM) 30 MG tablet Take 30 mg by mouth daily.  ? ezetimibe (ZETIA) 10 MG tablet TAKE 1 TABLET BY MOUTH EVERY DAY  ? glucose  blood (ONE TOUCH ULTRA TEST) test strip Use asto check blood sugar 5 times a day  ? losartan (COZAAR) 25 MG tablet TAKE 1 TABLET BY MOUTH EVERY DAY  ? Misc Natural Products (GLUCOSAMINE CHOND MSM FORMULA PO) Take 15 mLs by mouth daily.  ? Multiple Vitamin (MULTI VITAMIN PO) Take 300 mg by mouth daily in the afternoon. Exipure- supports healthy weight loss-  ? Multiple Vitamin (MULTIVITAMIN ADULT PO) Take 1 tablet by mouth daily. Memo Max Pro-  ? Multiple Vitamin (MULTIVITAMIN ADULT PO) Take 1,000 mg by mouth in the morning and at bedtime. - Estrogen supplement  ? Multiple Vitamin (MULTIVITAMIN PO) Take 12.5 mg by mouth daily. Iodoral-  ? Multiple Vitamin (MULTIVITAMIN PO) Take 50 mcg by mouth daily. Cholestar- Vitamin K27-  ? NON FORMULARY Systemic Formulas Pancreas supplement, 1 per day ?"                    "           Metabo-Shake (Glycemic Support) 1 scoop per day ?Reishi w/ Curcumin 1 per day  ? NON FORMULARY Systemic Formulas Glycemic Balance, 2 per day ?"                  "             Eyes supplement, 2 per day ?"                  "              ROX (Antioxidant w/ Reservatrol), 1 per day  ? NON FORMULARY Take 15 mLs by mouth daily. MAG CAL VANILLA LIQUID  ? NON FORMULARY Take 2 capsules by mouth every morning. Keraflex-  ? NOVOLOG 100 UNIT/ML injection USE MAX OF 60 UNITS DAILY VIA INSULIN PUMP.  ? Omega-3 Fatty Acids (SUPER OMEGA-3 PO) Take 2 capsules by mouth daily.  ? Red Yeast Rice Extract (RED YEAST RICE PO) Take 1,200 mg by mouth daily in the afternoon.  ? RESVERATROL PO Take 960 mg by mouth daily.  ? thyroid (ARMOUR THYROID) 15 MG tablet TAKE 1 TABLET DAILY WITH THE 30 MG TABLET (TOTAL OF 45 MG DAILY) (Patient taking differently: Take 15 mg by mouth daily. Along with 30 mg tab to equal 45 mg qd)  ? thyroid (ARMOUR) 30 MG tablet Take 1 tablet daily along with the 15 mg tablet for a total of 45 mg daily (Patient taking differently: Take 30 mg by mouth daily before breakfast. Along with 15 mg to equal  45 mg qd)  ? Turmeric Curcumin 500 MG CAPS Take 482 mg by mouth daily.  ?  ? ?  Allergies:   Rosuvastatin calcium  ? ?Social History  ? ?Socioeconomic History  ? Marital status: Married  ?  Spouse name: Not on file  ? Number of children: Not on file  ? Years of education: Not on file  ? Highest education level: Not on file  ?Occupational History  ? Not on file  ?Tobacco Use  ? Smoking status: Never  ? Smokeless tobacco: Never  ?Substance and Sexual Activity  ? Alcohol use: No  ? Drug use: No  ? Sexual activity: Yes  ?Other Topics Concern  ? Not on file  ?Social History Narrative  ? Not on file  ? ?Social Determinants of Health  ? ?Financial Resource Strain: Not on file  ?Food Insecurity: Not on file  ?Transportation Needs: Not on file  ?Physical Activity: Not on file  ?Stress: Not on file  ?Social Connections: Not on file  ?  ? ?Family History: ?The patient's family history includes Breast cancer (age of onset: 64) in her mother; Diabetes (age of onset: 57) in her maternal uncle; Heart attack in her father; Heart failure in her mother; Prostate cancer in her maternal uncle. ? ?ROS:   ?Please see the history of present illness.    ?No new complaints.  No recurrence of dizziness or lightheadedness.  All other systems reviewed and are negative. ? ?EKGs/Labs/Other Studies Reviewed:   ? ?The following studies were reviewed today: ?Doppler echocardiogram 02/11/2022 ?IMPRESSIONS  ? ? ? 1. Left ventricular ejection fraction, by estimation, is 60 to 65%. The  ?left ventricle has normal function. The left ventricle has no regional  ?wall motion abnormalities. There is mild concentric left ventricular  ?hypertrophy. Left ventricular diastolic  ?parameters are indeterminate.  ? 2. Right ventricular systolic function is normal. The right ventricular  ?size is normal. There is normal pulmonary artery systolic pressure. The  ?estimated right ventricular systolic pressure is 40.6 mmHg.  ? 3. The mitral valve is degenerative. Trivial  mitral valve regurgitation.  ?No evidence of mitral stenosis. Moderate mitral annular calcification.  ? 4. The aortic valve is tricuspid. Aortic valve regurgitation is not  ?visualized. Aortic valve [place]

## 2022-02-13 NOTE — Telephone Encounter (Signed)
DOD call placed to Dr. Johney Frame today on this pt.  ?Pts Ophthalmologist was calling DOD Dr. Johney Frame, to seek further advisement on this pt he had in the office at this time.  ?Pt was being seen by Ophthalmologist for new onset vision changes.  ?Pt does have a history of CRAO and afib.  She is an established Dr. Rayann Heman pt. ?Per Dr. Johney Frame, pt needs to be seen by EP APP (former Dr. Rayann Heman pt) or DOD tomorrow 5/3. ?No EP APP slots available and Dr. Rayann Heman will not be in the office tomorrow.  ?Scheduled the pt to see DOD Dr. Tamala Julian at 1030.  Pt aware of appt date and time and to arrive 15 mins prior to this appt.  ? ?Per DOD Dr. Johney Frame, pt needs to be ordered to have a 14 day zio for history of CRAO (Central retinal artery occlusion) and afib.  ?Order placed and pt aware that this will be mailed to her address on file.  Will send her zio instructions to her active mychart account.  Will make monitor techs aware to enroll the pt and mail this out.  ?Pt verbalized understanding and agrees with this plan. ?Will route this information to Dr. Rayann Heman and covering RN as an Juluis Rainier, to make them aware of this plan. ?

## 2022-02-14 ENCOUNTER — Ambulatory Visit: Payer: PPO | Admitting: Interventional Cardiology

## 2022-02-14 ENCOUNTER — Encounter: Payer: Self-pay | Admitting: Interventional Cardiology

## 2022-02-14 VITALS — BP 130/80 | HR 57 | Ht 63.0 in | Wt 154.8 lb

## 2022-02-14 DIAGNOSIS — I4891 Unspecified atrial fibrillation: Secondary | ICD-10-CM

## 2022-02-14 DIAGNOSIS — I10 Essential (primary) hypertension: Secondary | ICD-10-CM | POA: Diagnosis not present

## 2022-02-14 DIAGNOSIS — H3410 Central retinal artery occlusion, unspecified eye: Secondary | ICD-10-CM | POA: Diagnosis not present

## 2022-02-14 DIAGNOSIS — R55 Syncope and collapse: Secondary | ICD-10-CM | POA: Diagnosis not present

## 2022-02-14 DIAGNOSIS — Z7901 Long term (current) use of anticoagulants: Secondary | ICD-10-CM

## 2022-02-14 DIAGNOSIS — I119 Hypertensive heart disease without heart failure: Secondary | ICD-10-CM

## 2022-02-14 NOTE — Patient Instructions (Signed)
Medication Instructions:  ?Your physician recommends that you continue on your current medications as directed. Please refer to the Current Medication list given to you today. ? ?*If you need a refill on your cardiac medications before your next appointment, please call your pharmacy* ? ?Lab Work: ?NONE ? ?Testing/Procedures: ?Your physician has recommended that you wear an event monitor. Event monitors are medical devices that record the heart?s electrical activity. Doctors most often [place] these monitors to diagnose arrhythmias. Arrhythmias are problems with the speed or rhythm of the heartbeat. The monitor is a small, portable device. You can wear one while you do your normal daily activities. This is usually used to diagnose what is causing palpitations/syncope (passing out).  ? ?Follow-Up: ?At [facility], you and your health needs are our priority.  As part of our continuing mission to provide you with exceptional heart care, we have created designated Provider Care Teams.  These Care Teams include your primary Cardiologist (physician) and Advanced Practice Providers (APPs -  Physician Assistants and Nurse Practitioners) who all work together to provide you with the care you need, when you need it. ? ?Your next appointment:   ?As needed depending on results of monitor ? ?The format for your next appointment:   ?In Person ? ?Provider:   ?Sinclair Grooms, MD { ? ? ? ?Important Information About Sugar ? ? ? ? ?  ?

## 2022-02-15 DIAGNOSIS — I4891 Unspecified atrial fibrillation: Secondary | ICD-10-CM

## 2022-02-20 DIAGNOSIS — H33301 Unspecified retinal break, right eye: Secondary | ICD-10-CM | POA: Diagnosis not present

## 2022-02-20 DIAGNOSIS — H349 Unspecified retinal vascular occlusion: Secondary | ICD-10-CM | POA: Diagnosis not present

## 2022-02-21 DIAGNOSIS — H34212 Partial retinal artery occlusion, left eye: Secondary | ICD-10-CM | POA: Diagnosis not present

## 2022-02-21 DIAGNOSIS — H43813 Vitreous degeneration, bilateral: Secondary | ICD-10-CM | POA: Diagnosis not present

## 2022-02-21 DIAGNOSIS — H33321 Round hole, right eye: Secondary | ICD-10-CM | POA: Diagnosis not present

## 2022-03-01 DIAGNOSIS — E1065 Type 1 diabetes mellitus with hyperglycemia: Secondary | ICD-10-CM | POA: Diagnosis not present

## 2022-03-07 DIAGNOSIS — I4891 Unspecified atrial fibrillation: Secondary | ICD-10-CM | POA: Diagnosis not present

## 2022-03-07 DIAGNOSIS — E1065 Type 1 diabetes mellitus with hyperglycemia: Secondary | ICD-10-CM | POA: Diagnosis not present

## 2022-03-14 DIAGNOSIS — H353210 Exudative age-related macular degeneration, right eye, stage unspecified: Secondary | ICD-10-CM | POA: Diagnosis not present

## 2022-03-14 DIAGNOSIS — I10 Essential (primary) hypertension: Secondary | ICD-10-CM | POA: Diagnosis not present

## 2022-03-14 DIAGNOSIS — Z9849 Cataract extraction status, unspecified eye: Secondary | ICD-10-CM | POA: Diagnosis not present

## 2022-03-14 DIAGNOSIS — E1069 Type 1 diabetes mellitus with other specified complication: Secondary | ICD-10-CM | POA: Diagnosis not present

## 2022-03-14 DIAGNOSIS — M8580 Other specified disorders of bone density and structure, unspecified site: Secondary | ICD-10-CM | POA: Diagnosis not present

## 2022-03-14 DIAGNOSIS — Z87891 Personal history of nicotine dependence: Secondary | ICD-10-CM | POA: Diagnosis not present

## 2022-03-14 DIAGNOSIS — E663 Overweight: Secondary | ICD-10-CM | POA: Diagnosis not present

## 2022-03-14 DIAGNOSIS — E1165 Type 2 diabetes mellitus with hyperglycemia: Secondary | ICD-10-CM | POA: Diagnosis not present

## 2022-03-14 DIAGNOSIS — Z7982 Long term (current) use of aspirin: Secondary | ICD-10-CM | POA: Diagnosis not present

## 2022-03-14 DIAGNOSIS — H5452A1 Low vision left eye category 1, normal vision right eye: Secondary | ICD-10-CM | POA: Diagnosis not present

## 2022-03-14 DIAGNOSIS — E785 Hyperlipidemia, unspecified: Secondary | ICD-10-CM | POA: Diagnosis not present

## 2022-03-14 DIAGNOSIS — E039 Hypothyroidism, unspecified: Secondary | ICD-10-CM | POA: Diagnosis not present

## 2022-03-20 ENCOUNTER — Ambulatory Visit (INDEPENDENT_AMBULATORY_CARE_PROVIDER_SITE_OTHER): Payer: PPO | Admitting: Neurology

## 2022-03-20 ENCOUNTER — Encounter: Payer: Self-pay | Admitting: Neurology

## 2022-03-20 VITALS — BP 154/79 | HR 69 | Ht 64.0 in | Wt 153.5 lb

## 2022-03-20 DIAGNOSIS — H3412 Central retinal artery occlusion, left eye: Secondary | ICD-10-CM

## 2022-03-20 DIAGNOSIS — H349 Unspecified retinal vascular occlusion: Secondary | ICD-10-CM | POA: Diagnosis not present

## 2022-03-20 NOTE — Progress Notes (Signed)
Chief Complaint  Patient presents with   Hospitalization Follow-up    Rm 15. Accompanied by husband, [name]. hospital stroke f/u d/c home 4/30.      ASSESSMENT AND PLAN  Gina [name] is a 76 y.o. female   Left central retinal artery occlusion  CT angiogram of head and neck showed no large vessel disease, evidence of intracranial atherosclerotic disease.  Echocardiogram showed no significant abnormality  Vascular risk factors of hyperlipidemia, LDL 113, Long history of insulin-dependent diabetes, A1c of 7.2, atrial fibrillation, was on Eliquis 5 mg twice daily, now add on aspirin 81 mg daily  Has complete stroke evaluation, continue follow-up with her ophthalmologist, endocrinologist, primary care physician,  Only return to clinic for new issues   DIAGNOSTIC DATA (LABS, IMAGING, TESTING) - I reviewed patient records, labs, notes, testing and imaging myself where available.   MEDICAL HISTORY:  Gina [name] is a 76 years old female, seen in request by  Dr.Kumar, Ajay for hospital follow up of stroke.   I reviewed and summarized the referring note.  Past medical history Hyperlipidemia Atrial fibrillation, s/p ablation, on Eliquis DM 1, since age 75 SIADH,  Left central retinal artery occlusion Hypothyroidism Left breast cancer, status post left lobectomy in 2013 followed by radiation therapy  She has sudden onset of blurry vision while looking at screen, by closing one eye vs other, she noticed that her right vision is fine, it was left vision loss, left vision loss has been persistent since its onset, only mild recovery over the past few weeks, can only read large print  I personally reviewed MRI of brain, no acute abnormality noticed, lacunar 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  CTA of head and neck showed no large vessels disease, evidence of intracranial atherosclerotic disease.  ECHO showed  EF 60-65%, no significant abnormality noticed.  14 days cardiac monitoring showed no evidence of A fib.   PHYSICAL EXAM:   Vitals:   03/20/22 0929  BP: (!) 154/79  Pulse: 69  Weight: 153 lb 8 oz (69.6 kg)  Height: _0  (1.626 m)   Not recorded     Body mass index is 26.35 kg/m.  PHYSICAL EXAMNIATION:  Gen: NAD, conversant, well nourised, well groomed                     Cardiovascular: Regular rate rhythm, no peripheral edema, warm, nontender. Eyes: Conjunctivae clear without exudates or hemorrhage Neck: Supple, no carotid bruits. Pulmonary: Clear to auscultation bilaterally   NEUROLOGICAL EXAM:  MENTAL STATUS: Speech/cognition: Awake, alert, oriented to history taking and casual conversation CRANIAL NERVES: CN II: Visual fields are full to confrontation. Pupils are round equal and reactive to light, funduscopy examination showed paled bilateral optic nerve head CN III, IV, VI: extraocular movement are normal. No ptosis. CN V: Facial sensation is intact to light touch CN VII: Face is symmetric with normal eye closure  CN VIII: Hearing is normal to causal conversation. CN IX, X: Phonation is normal. CN XI: Head turning and shoulder shrug are intact  MOTOR: There is no pronator drift of out-stretched arms. Muscle bulk and tone are normal. Muscle strength is normal.  REFLEXES: Reflexes are 2+ and symmetric at the biceps, triceps, knees, and ankles. Plantar responses are flexor.  SENSORY: Intact to light touch, pinprick and vibratory sensation are intact in fingers and toes.  COORDINATION: There is no trunk or limb dysmetria noted.  GAIT/STANCE: Need push-up to  get up from seated position, cautious, steady  REVIEW OF SYSTEMS:  Full 14 system review of systems performed and notable only for as above All other review of systems were negative.   ALLERGIES: Allergies  Allergen Reactions   Rosuvastatin Calcium     Headaches, fatigue    HOME  MEDICATIONS: Current Outpatient Medications  Medication Sig Dispense Refill   apixaban (ELIQUIS) 5 MG TABS tablet TAKE 1 TABLET BY MOUTH TWICE A DAY (Patient taking differently: Take 5 mg by mouth 2 (two) times daily.) 60 tablet 5   Ascorbic Acid (VITAMIN C) 1000 MG tablet Take 1,000 mg by mouth daily.      ASHWAGANDHA PO Take 380 mg by mouth daily.     Cholecalciferol (VITAMIN D PO) Take 1 drop by mouth daily. vitamin d3 plus k2     Coenzyme Q10 (COQ10) 200 MG CAPS Take 200 mg by mouth daily.     Continuous Blood Gluc Sensor (DEXCOM G6 SENSOR) MISC by Does not apply route.     Continuous Blood Gluc Transmit (DEXCOM G6 TRANSMITTER) MISC by Does not apply route.     Cyanocobalamin (VITAMIN B-12) 5000 MCG SUBL Place 5,000 mcg under the tongue daily.     diltiazem (CARDIZEM) 30 MG tablet Take 30 mg by mouth daily.     ezetimibe (ZETIA) 10 MG tablet TAKE 1 TABLET BY MOUTH EVERY DAY 90 tablet 3   glucose blood (ONE TOUCH ULTRA TEST) test strip Use asto check blood sugar 5 times a day 200 each 5   losartan (COZAAR) 25 MG tablet TAKE 1 TABLET BY MOUTH EVERY DAY 90 tablet 3   Misc Natural Products (GLUCOSAMINE CHOND MSM FORMULA PO) Take 15 mLs by mouth daily.     Multiple Vitamin (MULTIVITAMIN ADULT PO) Take 1 tablet by mouth daily. Memo Max Pro-     Multiple Vitamin (MULTIVITAMIN ADULT PO) Take 1,000 mg by mouth in the morning and at bedtime. - Estrogen supplement     Multiple Vitamin (MULTIVITAMIN PO) Take 12.5 mg by mouth daily. Iodoral-     Multiple Vitamin (MULTIVITAMIN PO) Take 50 mcg by mouth daily. Cholestar- Vitamin K27-     NON FORMULARY Systemic Formulas Pancreas supplement, 1 per day "                    "           Metabo-Shake (Glycemic Support) 1 scoop per day Reishi w/ Curcumin 1 per day     NON FORMULARY Systemic Formulas Glycemic Balance, 2 per day "                  "             Eyes supplement, 2 per day "                  "              ROX (Antioxidant w/ Reservatrol), 1 per  day     NON FORMULARY Take 15 mLs by mouth daily. MAG CAL VANILLA LIQUID     NON FORMULARY Take 2 capsules by mouth every morning. Keraflex-     NOVOLOG 100 UNIT/ML injection USE MAX OF 60 UNITS DAILY VIA INSULIN PUMP. 50 mL 3   Omega-3 Fatty Acids (SUPER OMEGA-3 PO) Take 2 capsules by mouth daily.     Red Yeast Rice Extract (RED YEAST RICE PO) Take 1,200 mg by mouth daily in the afternoon.  RESVERATROL PO Take 960 mg by mouth daily.     thyroid (ARMOUR THYROID) 15 MG tablet TAKE 1 TABLET DAILY WITH THE 30 MG TABLET (TOTAL OF 45 MG DAILY) (Patient taking differently: Take 15 mg by mouth daily. Along with 30 mg tab to equal 45 mg qd) 90 tablet 3   thyroid (ARMOUR) 30 MG tablet Take 1 tablet daily along with the 15 mg tablet for a total of 45 mg daily (Patient taking differently: Take 30 mg by mouth daily before breakfast. Along with 15 mg to equal 45 mg qd) 30 tablet 11   Turmeric Curcumin 500 MG CAPS Take 482 mg by mouth daily.     No current facility-administered medications for this visit.   Facility-Administered Medications Ordered in Other Visits  Medication Dose Route Frequency Provider Last Rate Last Admin   topical emolient (BIAFINE) emulsion   Topical Daily Eppie Gibson, MD   Given at 05/28/12 1400    PAST MEDICAL HISTORY: Past Medical History:  Diagnosis Date   Anxiety    new dx   Atrial fibrillation ([place])    a. 03/2015 s/p RFCA;  b. CHA2DS2VASc = 5-->Eliquis.   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 03/5783 - due to AF RVR;  b. 03/2015 Echo: EF 60-65%, no rwma.   Hyperlipidemia    Hypertension    Hypothyroidism    Insulin pump in place    Ovarian cyst    S/P radiation therapy 04/10/12- 05/26/2012   left Breast and Axilla / 50 gy / 25 Fractions with Left Breast Boost / 10 Gy / 5  Fractions   SIADH (syndrome of inappropriate ADH production) ([facility])    Use of letrozole ([place]) 06/13/12    PAST SURGICAL HISTORY: Past Surgical History:  Procedure Laterality Date   APPENDECTOMY     BREAST LUMPECTOMY Left 03/05/2012   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: 1/1 Node Positive for Mets.: ER/PR POs., Her 2 Neu negative, Ki-67 12%   COLONOSCOPY     1 polyp    ELECTROPHYSIOLOGIC STUDY N/A 04/08/2015   PVI + CTI ablation + posterior Box lesion + Atach ablation by [name]   OVARIAN CYST SURGERY     TEE WITHOUT CARDIOVERSION N/A 04/07/2015   Procedure: TRANSESOPHAGEAL ECHOCARDIOGRAM (TEE);  Surgeon: Thayer Headings, MD;  Location: [facility] ENDOSCOPY;  Service: Cardiovascular;  Laterality: N/A;   TONSILLECTOMY      FAMILY HISTORY: Family History  Problem Relation Age of Onset   Breast cancer Mother 64   Heart failure Mother    Prostate cancer Maternal Uncle        died in his 79s   Heart attack Father    Diabetes Maternal Uncle 15    SOCIAL HISTORY: Social History   Socioeconomic History   Marital status: Married    Spouse name: Not on file   Number of children: Not on file   Years of education: Not on file   Highest education level: Not on file  Occupational History   Not on file  Tobacco Use   Smoking status: Never   Smokeless tobacco: Never  Substance and Sexual Activity   Alcohol use: No   Drug use: No   Sexual activity: Yes  Other Topics Concern   Not on  file  Social History Narrative   Not on file   Social Determinants of Health   Financial Resource Strain: Not on file  Food Insecurity: Not on file  Transportation Needs: Not on file  Physical Activity: Not on file  Stress: Not on file  Social Connections: Not on file  Intimate Partner Violence: Not on file      Marcial Pacas, M.D. Ph.D.  [facility] [address] [place], [place] [address] Ph: [phone] Fax:  [phone]  CC:  Elayne Snare, MD [address] [place],  [place] [address]  Elayne Snare, MD

## 2022-03-21 DIAGNOSIS — H43813 Vitreous degeneration, bilateral: Secondary | ICD-10-CM | POA: Diagnosis not present

## 2022-03-21 DIAGNOSIS — H31091 Other chorioretinal scars, right eye: Secondary | ICD-10-CM | POA: Diagnosis not present

## 2022-04-02 DIAGNOSIS — E1065 Type 1 diabetes mellitus with hyperglycemia: Secondary | ICD-10-CM | POA: Diagnosis not present

## 2022-04-06 DIAGNOSIS — E1065 Type 1 diabetes mellitus with hyperglycemia: Secondary | ICD-10-CM | POA: Diagnosis not present

## 2022-04-16 ENCOUNTER — Other Ambulatory Visit: Payer: Self-pay | Admitting: Endocrinology

## 2022-05-02 DIAGNOSIS — E1065 Type 1 diabetes mellitus with hyperglycemia: Secondary | ICD-10-CM | POA: Diagnosis not present

## 2022-05-07 DIAGNOSIS — E1065 Type 1 diabetes mellitus with hyperglycemia: Secondary | ICD-10-CM | POA: Diagnosis not present

## 2022-05-28 ENCOUNTER — Other Ambulatory Visit (INDEPENDENT_AMBULATORY_CARE_PROVIDER_SITE_OTHER): Payer: PPO

## 2022-05-28 DIAGNOSIS — E1065 Type 1 diabetes mellitus with hyperglycemia: Secondary | ICD-10-CM

## 2022-05-28 DIAGNOSIS — E7800 Pure hypercholesterolemia, unspecified: Secondary | ICD-10-CM

## 2022-05-28 DIAGNOSIS — E038 Other specified hypothyroidism: Secondary | ICD-10-CM | POA: Diagnosis not present

## 2022-05-28 LAB — BASIC METABOLIC PANEL
BUN: 12 mg/dL (ref 6–23)
CO2: 28 mEq/L (ref 19–32)
Calcium: 9.1 mg/dL (ref 8.4–10.5)
Chloride: 94 mEq/L — ABNORMAL LOW (ref 96–112)
Creatinine, Ser: 0.79 mg/dL (ref 0.40–1.20)
GFR: 71.9 mL/min (ref >60.00)
Glucose, Bld: 197 mg/dL — ABNORMAL HIGH (ref 70–99)
Potassium: 4.7 mEq/L (ref 3.5–5.1)
Sodium: 134 mEq/L — ABNORMAL LOW (ref 135–145)

## 2022-05-28 LAB — HEMOGLOBIN A1C: Hgb A1c MFr Bld: 7.2 % — ABNORMAL HIGH (ref 4.6–6.5)

## 2022-05-28 LAB — LDL CHOLESTEROL, DIRECT: Direct LDL: 90 mg/dL

## 2022-05-28 LAB — TSH: TSH: 0.99 u[IU]/mL (ref 0.35–5.50)

## 2022-05-31 ENCOUNTER — Ambulatory Visit: Payer: PPO | Admitting: Endocrinology

## 2022-05-31 ENCOUNTER — Encounter: Payer: Self-pay | Admitting: Endocrinology

## 2022-05-31 VITALS — BP 130/78 | HR 59 | Ht 64.0 in | Wt 154.2 lb

## 2022-05-31 DIAGNOSIS — E109 Type 1 diabetes mellitus without complications: Secondary | ICD-10-CM

## 2022-05-31 DIAGNOSIS — E7800 Pure hypercholesterolemia, unspecified: Secondary | ICD-10-CM | POA: Diagnosis not present

## 2022-05-31 DIAGNOSIS — E038 Other specified hypothyroidism: Secondary | ICD-10-CM

## 2022-05-31 DIAGNOSIS — E871 Hypo-osmolality and hyponatremia: Secondary | ICD-10-CM | POA: Diagnosis not present

## 2022-05-31 NOTE — Progress Notes (Signed)
Patient ID: Gina [name], female   DOB: 1945/12/01, 75 y.o.   MRN: [id number]     Reason for Appointment: Insulin Pump followup:   History of Present Illness   Diagnosis: Type 1 DIABETES MELITUS, date of diagnosis:  1984      HISTORY:  T-Slim PUMP SETTINGS are:  12 AM-2 AM = 0.3, 2 AM-4 AM = 0.45, 4 AM- 6 AM = 1.1, 6 AM-8 AM = 1.0, 8 AM = 0.9, 11:30 AM = 1.0; 2 PM = 0. 90, 6 PM = 0.7,10 PM-12 AM = 0.3  Total basal insulin 19 units, recent total daily insulin about 27 units  Carbohydrate ratio 1: 8 except 1: 7 from 2-6 PM  Blood sugar target 100.  Correction factor 1: 50 except 1: 40 2 PM-10 PM Active insulin 4 hours  Current management, blood sugar patterns and problems identified:  A1c is 7.2    She does fairly good with her overall control as before  She has some hyperglycemia at meals her average glucose at any given segment of the day is under 150  Most of her high readings are from not covering her lunch or dinner but generally blood sugars do not go up much over 180  With current settings hypoglycemia has been fairly well avoided  She will sometimes not bolus if she is planning to be active but at times will still have higher readings especially when eating out  Remember to bolus at the start of the meal before As before she may sometimes not take a bolus causing her sugar to be higher especially at lunch or dinner, at least once her sugar went higher from the late evening snack  Although she is only eating [place] sausage and eggs in the morning with coffee her blood sugars may tend to be as much is 180 after eating sometimes She continues to use a sleep mode overnight with adequate control and no hypoglycemia No recent weight change  CONTINUOUS GLUCOSE MONITORING RECORD INTERPRETATION    Glycemic patterns are as follows:   Overnight blood sugars are fairly well controlled without excessive variability or hypoglycemia except rarely low normal readings  before midnight HYPERGLYCEMIC episodes mild during the day and I asked between 5-8 PM, however this is not consistent also On average blood sugars are within the target range at all times Overall hypoglycemia has been minimal and only low at 11 PM or 2 PM POSTPRANDIAL readings tend to rise variably and may be the highest after lunch but also at breakfast may be rising significantly at times Premeal readings are generally fairly close to 130-140 average   Dexcom data:    CGM use % of time 98  2-week average/SD 140  Time in range       91%  % Time Above 180 9  % Time above 250   % Time Below 70 0      PRE-MEAL  overnight  mornings  afternoon  evening Overall  Glucose range:       Averages: 134 139  145 142    Previous data:     CGM use % of time   2-week average/SD 138+/-132  Time in range    89%  % Time Above 180 10  % Time above 250   % Time Below 70 1      PRE-MEAL  overnight  mornings  afternoon  evening Overall  Glucose range:       Averages: 129 129 137  146+/-41    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, housework and other activities   Wt Readings from Last 3 Encounters:  05/31/22 154 lb 3.2 oz (69.9 kg)  03/20/22 153 lb 8 oz (69.6 kg)  02/14/22 154 lb 12.8 oz (70.2 kg)    LABS:   Lab Results  Component Value Date   HGBA1C 7.2 (H) 05/28/2022   HGBA1C 7.2 (H) 01/22/2022   HGBA1C 7.4 (H) 09/19/2021   Lab Results  Component Value Date   MICROALBUR <0.7 05/16/2021   Secretary 113 (H) 02/10/2022   CREATININE 0.79 05/28/2022    Other problems discussed today: Review of systems  Lab on 05/28/2022  Component Date Value Ref Range Status   Sodium 05/28/2022 134 (L)  135 - 145 mEq/L Final   Potassium 05/28/2022 4.7  3.5 - 5.1 mEq/L Final   Chloride 05/28/2022 94 (L)  96 - 112 mEq/L Final   CO2 05/28/2022 28  19 - 32 mEq/L Final   Glucose, Bld 05/28/2022 197 (H)  70 - 99 mg/dL Final   BUN 05/28/2022 12  6 - 23 mg/dL Final   Creatinine, Ser 05/28/2022 0.79   0.40 - 1.20 mg/dL Final   GFR 05/28/2022 71.90  >60.00 mL/min Final   Calculated using the CKD-EPI Creatinine Equation (2021)   Calcium 05/28/2022 9.1  8.4 - 10.5 mg/dL Final   Direct LDL 05/28/2022 90.0  mg/dL Final   Optimal:  <100 mg/dLNear or Above Optimal:  100-129 mg/dLBorderline High:  130-159 mg/dLHigh:  160-189 mg/dLVery High:  >190 mg/dL   TSH 05/28/2022 0.99  0.35 - 5.50 uIU/mL Final   Hgb A1c MFr Bld 05/28/2022 7.2 (H)  4.6 - 6.5 % Final   Glycemic Control Guidelines for People with Diabetes:Non Diabetic:  <6%Goal of Therapy: <7%Additional Action Suggested:  >8%      Allergies as of 05/31/2022       Reactions   Rosuvastatin Calcium    Headaches, fatigue        Medication List        Accurate as of May 31, 2022 11:14 AM. If you have any questions, ask your nurse or doctor.          ASHWAGANDHA PO Take 380 mg by mouth daily.   aspirin EC 81 MG tablet Take 81 mg by mouth daily. Swallow whole.   CoQ10 200 MG Caps Take 200 mg by mouth daily.   Dexcom G6 Sensor Misc by Does not apply route.   Dexcom G6 Transmitter Misc by Does not apply route.   diltiazem 30 MG tablet Commonly known as: CARDIZEM Take 30 mg by mouth daily.   Eliquis 5 MG Tabs tablet Generic drug: apixaban TAKE 1 TABLET BY MOUTH TWICE A DAY What changed: how much to take   ezetimibe 10 MG tablet Commonly known as: ZETIA TAKE 1 TABLET BY MOUTH EVERY DAY   GLUCOSAMINE CHOND MSM FORMULA PO Take 15 mLs by mouth daily.   glucose blood test strip Commonly known as: ONE TOUCH ULTRA TEST Use asto check blood sugar 5 times a day   losartan 25 MG tablet Commonly known as: COZAAR TAKE 1 TABLET BY MOUTH EVERY DAY   MULTIVITAMIN PO Take 12.5 mg by mouth daily. Iodoral-   MULTIVITAMIN PO Take 50 mcg by mouth daily. Cholestar- Vitamin K27-   MULTIVITAMIN ADULT PO Take 1 tablet by mouth daily. Memo Max Pro-   MULTIVITAMIN ADULT PO Take 1,000 mg by mouth in the morning and at  bedtime. - Estrogen  supplement   NON FORMULARY Systemic Formulas Pancreas supplement, 1 per day "                    "           Metabo-Shake (Glycemic Support) 1 scoop per day Reishi w/ Curcumin 1 per day   NON FORMULARY Systemic Formulas Glycemic Balance, 2 per day "                  "             Eyes supplement, 2 per day "                  "              ROX (Antioxidant w/ Reservatrol), 1 per day   NON FORMULARY Take 15 mLs by mouth daily. MAG CAL VANILLA LIQUID   NON FORMULARY Take 2 capsules by mouth every morning. Keraflex-   NovoLOG 100 UNIT/ML injection Generic drug: insulin aspart USE MAX OF 60 UNITS DAILY VIA INSULIN PUMP.   RED YEAST RICE PO Take 1,200 mg by mouth daily in the afternoon.   RESVERATROL PO Take 960 mg by mouth daily.   SUPER OMEGA-3 PO Take 2 capsules by mouth daily.   thyroid 15 MG tablet Commonly known as: Armour Thyroid TAKE 1 TABLET DAILY WITH THE 30 MG TABLET (TOTAL OF 45 MG DAILY) What changed:  how much to take how to take this when to take this additional instructions   thyroid 30 MG tablet Commonly known as: Armour Thyroid TAKE 1 TABLET DAILY ALONG WITH THE 15 MG TABLET FOR A TOTAL OF 45 MG DAILY What changed: Another medication with the same name was changed. Make sure you understand how and when to take each.   Turmeric Curcumin 500 MG Caps Take 482 mg by mouth daily.   Vitamin B-12 5000 MCG Subl Place 5,000 mcg under the tongue daily.   vitamin C 1000 MG tablet Take 1,000 mg by mouth daily.   VITAMIN D PO Take 1 drop by mouth daily. vitamin d3 plus k2        Allergies:  Allergies  Allergen Reactions   Rosuvastatin Calcium     Headaches, fatigue    Past Medical History:  Diagnosis Date   Anxiety    new dx   Atrial fibrillation (HCC)    a. 03/2015 s/p RFCA;  b. CHA2DS2VASc = 5-->Eliquis.   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 12/2014 - due to AF RVR;  b. 03/2015 Echo: EF 60-65%, no rwma.   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 for Mets.: ER/PR POs., Her 2 Neu negative, Ki-67 12%  COLONOSCOPY     1 polyp    ELECTROPHYSIOLOGIC STUDY N/A 04/08/2015   PVI + CTI ablation + posterior Box lesion + Atach ablation by Dr Johney Frame   OVARIAN CYST SURGERY     TEE WITHOUT CARDIOVERSION N/A 04/07/2015   Procedure: TRANSESOPHAGEAL ECHOCARDIOGRAM (TEE);  Surgeon: Vesta Mixer, MD;  Location: [facility] ENDOSCOPY;  Service: Cardiovascular;  Laterality: N/A;   TONSILLECTOMY      Family History  Problem Relation Age of Onset   Breast cancer Mother 1   Heart failure Mother    Prostate cancer Maternal Uncle        died in his 57s   Heart attack Father    Diabetes Maternal Uncle 50    Social History:  reports that she has never smoked. She has never used smokeless tobacco. She reports that she does not drink alcohol and does not use drugs.  REVIEW of systems:     She has a history of primary hypothyroidism    She is now being managed here with Armour Thyroid which she prefers Not taking consistent dose of 45 mg daily before breakfast She feels well  TSH has been subsequently normal  Free T4 and T3 are consistently normal  Lab Results  Component Value Date   TSH 0.99 05/28/2022   TSH 3.02 01/22/2022   TSH 2.40 09/19/2021    FREET4 1.05 09/19/2021   FREET4 0.90 05/16/2021   FREET4 0.94 02/07/2021   Lab Results  Component Value Date   T3FREE 3.5 09/19/2021   T3FREE 3.4 05/16/2021     HYPERCHOLESTEROLEMIA: Her LDL has been persistently high and refuses to take a statin drug including pravastatin even though it had no side effects previously. Previously her LDL particle number was high at 1621  She was given a prescription for Crestor which she tried for 3-4 weeks but did not continue because she felt it was giving her headaches and making her tired  She prefers to use natural remedies but these have not been very effective in the past.     She is taking Zetia regularly as prescribed with improved control since 1/22 LDL now below 100 and generally has a good diet  Her LDL levels as below   Lab Results  Component Value Date   CHOL 203 (H) 02/10/2022   CHOL 185 01/22/2022   CHOL 193 05/16/2021   Lab Results  Component Value Date   HDL 81 02/10/2022   HDL 73.80 01/22/2022   HDL 64.80 05/16/2021   Lab Results  Component Value Date   LDLCALC 113 (H) 02/10/2022   LDLCALC 99 01/22/2022   LDLCALC 116 (H) 05/16/2021   Lab Results  Component Value Date   TRIG 47 02/10/2022   TRIG 65.0 01/22/2022   TRIG 65.0 05/16/2021   Lab Results  Component Value Date   CHOLHDL 2.5 02/10/2022   CHOLHDL 3 01/22/2022   CHOLHDL 3 05/16/2021   Lab Results  Component Value Date   LDLDIRECT 90.0 05/28/2022   LDLDIRECT 148.4 08/06/2013   .  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 been usually mild, longstanding, not on any diuretics or SSRIs  Urine osmolality as of 04/2015 was 618 She was told to cut back on her fluid intake  Sodium is variable but generally between 129-134   Lab Results  Component Value Date   NA 134 (L) 05/28/2022   K 4.7 05/28/2022   CL 94 (L) 05/28/2022   CO2 28 05/28/2022   She refused the second COVID-vaccine  Also refuses the influenza vaccine   EXAM:  BP 130/78   Pulse (!) 59   Ht  $R'5\' 4"'kj$  (1.626 m)   Wt 154 lb 3.2 oz (69.9 kg)   SpO2 98%   BMI 26.47 kg/m     ASSESSMENT: 8/23  DIABETES type I on T-Slim pump  See history of present illness for detailed discussion of current diabetes management, blood sugar patterns and problems identified  Her A1c is still excellent at 7.2  Considering her age and duration of diabetes her blood sugar control is now adequate Also she is now consistently over 90% or more within target range  Blood sugars still may be going up at some meals especially lunch and sometimes dinner based on her estimation of not covering low-carb meals  Discussed in her CGM interpretation above she only has some hyperglycemic episodes when she is eating more carbohydrates and not bolusing or not bolusing enough at lunch and dinner  Has only minimal hypoglycemia  RECOMMENDATIONS:  Today discussed that even when she is somewhat active she may still need to cover higher carbohydrate meals especially if eating out Also likely needs to cover her breakfast with 5 to 8 g of carbohydrate entry especially when she is drinking coffee to cover the rise in blood sugar No need to change her basal settings Also current carbohydrate rate shows is appearing to be adequate   SIADH and hyponatremia: This is mild and sodium is improved at 134 Still needs to avoid excessive fluid intake   Hyperlipidemia: Well-controlled and LDL below 100 now, recommended continuing Zetia which she agrees to do  Hypothyroidism: TSH is consistently normal using supplement of 45 mg of Armour Thyroid  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

## 2022-06-05 ENCOUNTER — Encounter: Payer: Self-pay | Admitting: Endocrinology

## 2022-06-05 DIAGNOSIS — E1065 Type 1 diabetes mellitus with hyperglycemia: Secondary | ICD-10-CM | POA: Diagnosis not present

## 2022-06-06 DIAGNOSIS — E1065 Type 1 diabetes mellitus with hyperglycemia: Secondary | ICD-10-CM | POA: Diagnosis not present

## 2022-06-26 DIAGNOSIS — H349 Unspecified retinal vascular occlusion: Secondary | ICD-10-CM | POA: Diagnosis not present

## 2022-07-09 ENCOUNTER — Other Ambulatory Visit: Payer: Self-pay | Admitting: Internal Medicine

## 2022-07-09 DIAGNOSIS — I4819 Other persistent atrial fibrillation: Secondary | ICD-10-CM

## 2022-07-09 NOTE — Telephone Encounter (Signed)
Eliquis '5mg'$  refill request received. Patient is 76 years old, weight-69.9kg, Crea-0.79 on 05/28/2022, Diagnosis-Afib, and last seen by Dr. Tamala Julian on 02/15/2022. Dose is appropriate based on dosing criteria. Will send in refill to requested pharmacy.

## 2022-07-11 DIAGNOSIS — E1065 Type 1 diabetes mellitus with hyperglycemia: Secondary | ICD-10-CM | POA: Diagnosis not present

## 2022-07-17 ENCOUNTER — Other Ambulatory Visit: Payer: Self-pay | Admitting: Endocrinology

## 2022-08-06 ENCOUNTER — Other Ambulatory Visit: Payer: Self-pay | Admitting: Endocrinology

## 2022-08-10 DIAGNOSIS — E1065 Type 1 diabetes mellitus with hyperglycemia: Secondary | ICD-10-CM | POA: Diagnosis not present

## 2022-09-18 ENCOUNTER — Other Ambulatory Visit (INDEPENDENT_AMBULATORY_CARE_PROVIDER_SITE_OTHER): Payer: PPO

## 2022-09-18 DIAGNOSIS — E038 Other specified hypothyroidism: Secondary | ICD-10-CM

## 2022-09-18 DIAGNOSIS — E109 Type 1 diabetes mellitus without complications: Secondary | ICD-10-CM

## 2022-09-18 DIAGNOSIS — E7800 Pure hypercholesterolemia, unspecified: Secondary | ICD-10-CM

## 2022-09-18 LAB — COMPREHENSIVE METABOLIC PANEL
ALT: 16 U/L (ref 0–35)
AST: 20 U/L (ref 0–37)
Albumin: 4.1 g/dL (ref 3.5–5.2)
Alkaline Phosphatase: 68 U/L (ref 39–117)
BUN: 14 mg/dL (ref 6–23)
CO2: 31 mEq/L (ref 19–32)
Calcium: 9.1 mg/dL (ref 8.4–10.5)
Chloride: 97 mEq/L (ref 96–112)
Creatinine, Ser: 0.82 mg/dL (ref 0.40–1.20)
GFR: 68.6 mL/min (ref >60.00)
Glucose, Bld: 232 mg/dL — ABNORMAL HIGH (ref 70–99)
Potassium: 4.3 mEq/L (ref 3.5–5.1)
Sodium: 134 mEq/L — ABNORMAL LOW (ref 135–145)
Total Bilirubin: 0.5 mg/dL (ref 0.2–1.2)
Total Protein: 6.8 g/dL (ref 6.0–8.3)

## 2022-09-18 LAB — LIPID PANEL
Cholesterol: 222 mg/dL — ABNORMAL HIGH (ref 0–200)
HDL: 71.3 mg/dL (ref >39.00)
LDL Cholesterol: 136 mg/dL — ABNORMAL HIGH (ref 0–99)
NonHDL: 150.28
Total CHOL/HDL Ratio: 3
Triglycerides: 71 mg/dL (ref 0.0–149.0)
VLDL: 14.2 mg/dL (ref 0.0–40.0)

## 2022-09-18 LAB — T4, FREE: Free T4: 1.06 ng/dL (ref 0.60–1.60)

## 2022-09-18 LAB — HEMOGLOBIN A1C: Hgb A1c MFr Bld: 7.2 % — ABNORMAL HIGH (ref 4.6–6.5)

## 2022-09-18 LAB — MICROALBUMIN / CREATININE URINE RATIO
Creatinine,U: 150.9 mg/dL
Microalb Creat Ratio: 2 mg/g (ref 0.0–30.0)
Microalb, Ur: 3.1 mg/dL — ABNORMAL HIGH (ref 0.0–1.9)

## 2022-09-18 LAB — TSH: TSH: 1.78 u[IU]/mL (ref 0.35–5.50)

## 2022-09-20 ENCOUNTER — Encounter (HOSPITAL_COMMUNITY): Payer: Self-pay | Admitting: Nurse Practitioner

## 2022-09-20 ENCOUNTER — Ambulatory Visit (HOSPITAL_COMMUNITY)
Admission: RE | Admit: 2022-09-20 | Discharge: 2022-09-20 | Disposition: A | Discharge status: Home / Self Care | Payer: PPO | Source: Ambulatory Visit | Attending: Nurse Practitioner | Admitting: Nurse Practitioner

## 2022-09-20 VITALS — BP 168/70 | HR 70 | Ht 64.0 in | Wt 156.0 lb

## 2022-09-20 DIAGNOSIS — Z833 Family history of diabetes mellitus: Secondary | ICD-10-CM | POA: Insufficient documentation

## 2022-09-20 DIAGNOSIS — Z8249 Family history of ischemic heart disease and other diseases of the circulatory system: Secondary | ICD-10-CM | POA: Insufficient documentation

## 2022-09-20 DIAGNOSIS — I4891 Unspecified atrial fibrillation: Secondary | ICD-10-CM | POA: Diagnosis not present

## 2022-09-20 DIAGNOSIS — D6869 Other thrombophilia: Secondary | ICD-10-CM

## 2022-09-20 DIAGNOSIS — I10 Essential (primary) hypertension: Secondary | ICD-10-CM | POA: Insufficient documentation

## 2022-09-20 DIAGNOSIS — Z7901 Long term (current) use of anticoagulants: Secondary | ICD-10-CM | POA: Diagnosis not present

## 2022-09-20 DIAGNOSIS — E119 Type 2 diabetes mellitus without complications: Secondary | ICD-10-CM | POA: Diagnosis not present

## 2022-09-20 DIAGNOSIS — Z79899 Other long term (current) drug therapy: Secondary | ICD-10-CM | POA: Insufficient documentation

## 2022-09-20 NOTE — Progress Notes (Signed)
Primary Care Physician: Elayne Snare, MD Referring Physician: Dr. Leonides Grills Gina [name] is a 76 y.o. female with a h/o paroxysmal  afib s/p ablation 04/08/15. She had done very well with no reoccurrence of  sustained afib.She is off antiarrythmic's and rate control for some time. She continue on eliquis without bleeding problems.   F/u in afib clinic, 09/20/21. EKG shows today NSR. She reports no awareness of afib and that she feels well. She does report that she had a syncopal spell while cooking this past Thanksgiving dinner. She was noted to have a BP of 88 systolic at the time. She may have been dehydrated and with prolonging standing, her BP dropped. She was able to complete cooking the meal after her husband helped her get up. BP returned to normal limits within a few minutes.  No heart rate issues. No neuro signs.  Had one other syncopal spell in 2019 but cannot remember specifics.   F/u in afib clinic 02/01/22. She is here today as  she felt she may be back in afib. EKG today shows SR. She states that the morning of 4/12, shortly after she got up, she vomited and flet weak. She then started checking her V/S and her BP was low  and heart rate was irregular but not elevated. She improved during the day but felt  weak, she has not had any other spells.   F/u in afib clinic, 09/20/22, since I saw her 4/20, she was seen int the ER for a sudden loss of vision and left central retinal artery occlusion. She wore an event monitor for 2 weeks and there was no afib. She has seen Neurology and Dr. Tamala Julian for f/u. She was compliant with Eliquis. Baby ASA was added and she has regained most of her vision from the stroke. No afib that she has noted. She had very infrequent near syncope or brief syncope spells that she has had for years and frequently occur with standing, resolve with sitting. V/S and blood sugars never show any irregularity. She has mentioned to to  her other providers as well. She is not  driving now.   Today, she denies symptoms of palpitations, chest pain, shortness of breath, orthopnea, PND, lower extremity edema, dizziness, presyncope, syncope, or neurologic sequela. The patient is tolerating medications without difficulties and is otherwise without complaint today.   Past Medical History:  Diagnosis Date   Anxiety    new dx   Atrial fibrillation ([place])    a. 03/2015 s/p RFCA;  b. CHA2DS2VASc = 5-->Eliquis.   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 01/3887 - due to AF RVR;  b. 03/2015 Echo: EF 60-65%, no rwma.   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: 1/1 Node Positive  for Mets.: ER/PR POs., Her 2 Neu negative, Ki-67 12%   COLONOSCOPY     1 polyp    ELECTROPHYSIOLOGIC STUDY N/A 04/08/2015   PVI + CTI ablation + posterior Box lesion + Atach ablation by [name]   OVARIAN CYST SURGERY     TEE WITHOUT CARDIOVERSION N/A 04/07/2015   Procedure: TRANSESOPHAGEAL ECHOCARDIOGRAM (TEE);  Surgeon: Thayer Headings, MD;  Location: [facility] ENDOSCOPY;  Service: Cardiovascular;  Laterality: N/A;   TONSILLECTOMY      Current Outpatient Medications  Medication Sig Dispense Refill   Ascorbic Acid (VITAMIN C) 1000 MG tablet Take 1,000 mg by mouth daily.      ASHWAGANDHA PO Take 380 mg by mouth daily.     aspirin EC 81 MG tablet Take 81 mg by  mouth daily. Swallow whole.     Cholecalciferol (VITAMIN D PO) Take 1 drop by mouth daily. vitamin d3 plus k2     Coenzyme Q10 (COQ10) 200 MG CAPS Take 200 mg by mouth daily.     Continuous Blood Gluc Sensor (DEXCOM G6 SENSOR) MISC by Does not apply route.     Continuous Blood Gluc Transmit (DEXCOM G6 TRANSMITTER) MISC by Does not apply route.     Cyanocobalamin (VITAMIN B-12) 5000 MCG SUBL Place 5,000 mcg under the tongue daily.     diltiazem (CARDIZEM) 30 MG tablet Take 30 mg by mouth daily.     ELIQUIS 5 MG TABS tablet TAKE 1 TABLET BY MOUTH TWICE A DAY 60 tablet 5   ezetimibe (ZETIA) 10 MG tablet TAKE 1 TABLET BY MOUTH EVERY DAY 90 tablet 3   glucose blood (ONE TOUCH ULTRA TEST) test strip Use asto check blood sugar 5 times a day 200 each 5   losartan (COZAAR) 25 MG tablet TAKE 1 TABLET BY MOUTH EVERY DAY 90 tablet 3   Misc Natural Products (GLUCOSAMINE CHOND MSM FORMULA PO) Take 15 mLs by mouth daily.     Multiple Vitamin (MULTIVITAMIN ADULT PO) Take 1 tablet by mouth daily. Memo Max Pro-     Multiple Vitamin (MULTIVITAMIN ADULT PO) Take 1,000 mg by mouth in the morning and at bedtime. - Estrogen supplement     Multiple Vitamin (MULTIVITAMIN PO) Take 12.5 mg by mouth daily. Iodoral-     Multiple Vitamin (MULTIVITAMIN PO) Take 50 mcg by mouth daily. Cholestar- Vitamin K27-     NON FORMULARY Systemic Formulas Pancreas supplement, 1 per day "                    "           Metabo-Shake (Glycemic Support) 1 scoop per day Reishi w/ Curcumin 1 per day     NON FORMULARY Systemic Formulas Glycemic Balance, 2 per day "                  "             Eyes supplement, 2 per day "                  "              ROX (Antioxidant w/ Reservatrol), 1 per day     NON FORMULARY Take 15 mLs by mouth daily. MAG CAL VANILLA LIQUID     NON FORMULARY Take 2 capsules by mouth every morning. Keraflex-     NOVOLOG 100 UNIT/ML injection USE MAX OF 60 UNITS DAILY VIA INSULIN PUMP. 50 mL 3  Omega-3 Fatty Acids  (SUPER OMEGA-3 PO) Take 2 capsules by mouth daily.     Red Yeast Rice Extract (RED YEAST RICE PO) Take 1,200 mg by mouth daily in the afternoon.     RESVERATROL PO Take 960 mg by mouth daily.     thyroid (ARMOUR THYROID) 15 MG tablet TAKE 1 TABLET DAILY WITH THE 30 MG TABLET (TOTAL OF 45 MG DAILY) 90 tablet 3   thyroid (ARMOUR THYROID) 30 MG tablet TAKE 1 TABLET DAILY ALONG WITH THE 15 MG TABLET FOR A TOTAL OF 45 MG DAILY 90 tablet 3   Turmeric Curcumin 500 MG CAPS Take 482 mg by mouth daily.     No current facility-administered medications for this encounter.   Facility-Administered Medications Ordered in Other Encounters  Medication Dose Route Frequency Provider Last Rate Last Admin   topical emolient (BIAFINE) emulsion   Topical Daily Eppie Gibson, MD   Given at 05/28/12 1400    Allergies  Allergen Reactions   Rosuvastatin Calcium     Headaches, fatigue    Social History   Socioeconomic History   Marital status: Married    Spouse name: Not on file   Number of children: Not on file   Years of education: Not on file   Highest education level: Not on file  Occupational History   Not on file  Tobacco Use   Smoking status: Never   Smokeless tobacco: Never  Substance and Sexual Activity   Alcohol use: No   Drug use: No   Sexual activity: Yes  Other Topics Concern   Not on file  Social History Narrative   Not on file   Social Determinants of Health   Financial Resource Strain: Not on file  Food Insecurity: Not on file  Transportation Needs: Not on file  Physical Activity: Not on file  Stress: Not on file  Social Connections: Not on file  Intimate Partner Violence: Not on file    Family History  Problem Relation Age of Onset   Breast cancer Mother 60   Heart failure Mother    Prostate cancer Maternal Uncle        died in his 63s   Heart attack Father    Diabetes Maternal Uncle 15    ROS- All systems are reviewed and negative except as per the HPI  above  Physical Exam: Vitals:   09/20/22 0942  Height: _0  (1.626 m)   Wt Readings from Last 3 Encounters:  05/31/22 69.9 kg  03/20/22 69.6 kg  02/14/22 70.2 kg    Labs: Lab Results  Component Value Date   NA 134 (L) 09/18/2022   K 4.3 09/18/2022   CL 97 09/18/2022   CO2 31 09/18/2022   GLUCOSE 232 (H) 09/18/2022   BUN 14 09/18/2022   CREATININE 0.82 09/18/2022   CALCIUM 9.1 09/18/2022   MG 1.7 01/13/2015   Lab Results  Component Value Date   INR 1.1 02/10/2022   Lab Results  Component Value Date   CHOL 222 (H) 09/18/2022   HDL 71.30 09/18/2022   LDLCALC 136 (H) 09/18/2022   TRIG 71.0 09/18/2022     GEN- The patient is well appearing, alert and oriented x 3 today.   Head- normocephalic, atraumatic Eyes-  Sclera clear, conjunctiva pink Ears- hearing intact Oropharynx- clear Neck- supple, no JVP Lymph- no cervical lymphadenopathy Lungs- Clear to ausculation bilaterally, normal work of breathing Heart- Regular rate and rhythm, no murmurs, rubs or gallops, PMI not laterally displaced  GI- soft, NT, ND, + BS Extremities- no clubbing, cyanosis, or edema MS- no significant deformity or atrophy Skin- no rash or lesion Psych- euthymic mood, full affect Neuro- strength and sensation are intact  EKG-Vent. rate 70 BPM PR interval 160 ms QRS duration 76 ms QT/QTcB 394/425 ms P-R-T axes 64 78 49 Normal sinus rhythm Normal ECG When compared with ECG of 11-Feb-2022 03:41, PREVIOUS ECG IS PRESENT     Assessment and Plan: 1. Afib Very quiet in the 6 years following ablation  Has Cardizem 30 mg to use as needed if breakthrough afib  2. CHA2DS2VASc score of at least 4 Eliquis appropriately dosed at 5 mg bid   3. HTN Elevated  on presentation  At home stable    4. DM Per pt controlled Per Dr. 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

## 2022-09-21 ENCOUNTER — Encounter: Payer: Self-pay | Admitting: Endocrinology

## 2022-09-21 ENCOUNTER — Ambulatory Visit: Payer: PPO | Admitting: Endocrinology

## 2022-09-21 VITALS — BP 138/78 | HR 63 | Ht 64.0 in | Wt 154.0 lb

## 2022-09-21 DIAGNOSIS — E7800 Pure hypercholesterolemia, unspecified: Secondary | ICD-10-CM

## 2022-09-21 DIAGNOSIS — E871 Hypo-osmolality and hyponatremia: Secondary | ICD-10-CM

## 2022-09-21 DIAGNOSIS — E038 Other specified hypothyroidism: Secondary | ICD-10-CM | POA: Diagnosis not present

## 2022-09-21 DIAGNOSIS — E1065 Type 1 diabetes mellitus with hyperglycemia: Secondary | ICD-10-CM | POA: Diagnosis not present

## 2022-09-21 DIAGNOSIS — E109 Type 1 diabetes mellitus without complications: Secondary | ICD-10-CM | POA: Diagnosis not present

## 2022-09-21 NOTE — Patient Instructions (Signed)
?   On Zetia

## 2022-09-21 NOTE — Progress Notes (Unsigned)
Patient ID: Gina [name], female   DOB: May 30, 1946, 76 y.o.   MRN: [id number]     Reason for Appointment: Insulin Pump followup:   History of Present Illness   Diagnosis: Type 1 DIABETES MELITUS, date of diagnosis:  1984      HISTORY:  T-Slim PUMP SETTINGS are:  12 AM-2 AM = 0.3, 2 AM-4 AM = 0.45, 4 AM- 6 AM = 1.1, 6 AM-8 AM = 1.0, 8 AM = 0.9, 11:30 AM = 1.0; 2 PM = 1.05, 6 PM = 0.7,10 PM-12 AM = 0.3  Total basal insulin 19 units, recent total daily insulin about 27 units  Carbohydrate ratio 1: 8 except 1: 7 from 2-6 PM  Blood sugar target 100.  Correction factor 1: 50 except 1: 40 2 PM-10 PM Active insulin 5 hours  Current management, blood sugar patterns and problems identified:  A1c is 7.2, unchanged  She still is not linked with the T connect app and difficult to download her pump  Recently overall her blood sugar control is excellent with 85% within target range Again she has issues with hyperglycemia related to missed boluses or underestimating her boluses at mealtimes and possibly for snacks Also she was told to bolus for coffee in the morning which she is sometimes doing but not consistently, taking as much is 15 g bolus for coffee However there is no consistent time when she has hyperglycemia Overnight blood sugars are generally excellent and using the sleep mode as programmed fairly good with her overall control as before  Highest blood sugars overall are in the late afternoon but not consistent Since she is not very active has less tendency to low sugars No recent weight change  CONTINUOUS GLUCOSE MONITORING RECORD INTERPRETATION    Glycemic patterns are as follows:   Overnight blood sugars are fairly well controlled with blood sugars fairly stable especially early morning and 84% within the target range  HYPERGLYCEMIC episodes are occurring mostly in the afternoons after about 4 PM but not consistent and occasionally after 8 PM  Highest blood  sugars on an average are in the afternoon segment POSTPRANDIAL readings are mildly increased in the mornings, variably in the afternoons and overall not significant in the evening with some exceptions  Premeal readings are well-controlled in the mornings and lunchtime but tend to be higher at dinnertime  No hypoglycemia     CGM use % of time   2-week average/SD 140  Time in range     85   %  % Time Above 180 50  % Time above 250   % Time Below 70 0      Previous data  CGM use % of time 98  2-week average/SD 140  Time in range       91%  % Time Above 180 9  % Time above 250   % Time Below 70 0      PRE-MEAL  overnight  mornings  afternoon  evening Overall  Glucose range:       Averages: 134 139  145 142      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, housework and other activities   Wt Readings from Last 3 Encounters:  09/21/22 154 lb (69.9 kg)  09/20/22 156 lb (70.8 kg)  05/31/22 154 lb 3.2 oz (69.9 kg)    LABS:   Lab Results  Component Value Date   HGBA1C 7.2 (H) 09/18/2022   HGBA1C 7.2 (H) 05/28/2022   HGBA1C 7.2 (H)  01/22/2022   Lab Results  Component Value Date   MICROALBUR 3.1 (H) 09/18/2022   LDLCALC 136 (H) 09/18/2022   CREATININE 0.82 09/18/2022    Other problems discussed today: Review of systems  Lab on 09/18/2022  Component Date Value Ref Range Status   Free T4 09/18/2022 1.06  0.60 - 1.60 ng/dL Final   Comment: Specimens from patients who are undergoing biotin therapy and /or ingesting biotin supplements may contain high levels of biotin.  The higher biotin concentration in these specimens interferes with this Free T4 assay.  Specimens that contain high levels  of biotin may cause false high results for this Free T4 assay.  Please interpret results in light of the total clinical presentation of the patient.     TSH 09/18/2022 1.78  0.35 - 5.50 uIU/mL Final   Cholesterol 09/18/2022 222 (H)  0 - 200 mg/dL Final   ATP III Classification        Desirable:  < 200 mg/dL               Borderline High:  200 - 239 mg/dL          High:  > = 240 mg/dL   Triglycerides 09/18/2022 71.0  0.0 - 149.0 mg/dL Final   Normal:  <150 mg/dLBorderline High:  150 - 199 mg/dL   HDL 09/18/2022 71.30  >39.00 mg/dL Final   VLDL 09/18/2022 14.2  0.0 - 40.0 mg/dL Final   LDL Cholesterol 09/18/2022 136 (H)  0 - 99 mg/dL Final   Total CHOL/HDL Ratio 09/18/2022 3   Final                  Men          Women1/2 Average Risk     3.4          3.3Average Risk          5.0          4.42X Average Risk          9.6          7.13X Average Risk          15.0          11.0                       NonHDL 09/18/2022 150.28   Final   NOTE:  Non-HDL goal should be 30 mg/dL higher than patient's LDL goal (i.e. LDL goal of < 70 mg/dL, would have non-HDL goal of < 100 mg/dL)   Microalb, Ur 09/18/2022 3.1 (H)  0.0 - 1.9 mg/dL Final   Creatinine,U 09/18/2022 150.9  mg/dL Final   Microalb Creat Ratio 09/18/2022 2.0  0.0 - 30.0 mg/g Final   Sodium 09/18/2022 134 (L)  135 - 145 mEq/L Final   Potassium 09/18/2022 4.3  3.5 - 5.1 mEq/L Final   Chloride 09/18/2022 97  96 - 112 mEq/L Final   CO2 09/18/2022 31  19 - 32 mEq/L Final   Glucose, Bld 09/18/2022 232 (H)  70 - 99 mg/dL Final   BUN 09/18/2022 14  6 - 23 mg/dL Final   Creatinine, Ser 09/18/2022 0.82  0.40 - 1.20 mg/dL Final   Total Bilirubin 09/18/2022 0.5  0.2 - 1.2 mg/dL Final   Alkaline Phosphatase 09/18/2022 68  39 - 117 U/L Final   AST 09/18/2022 20  0 - 37 U/L Final   ALT 09/18/2022 16  0 - 35 U/L  Final   Total Protein 09/18/2022 6.8  6.0 - 8.3 g/dL Final   Albumin 09/18/2022 4.1  3.5 - 5.2 g/dL Final   GFR 09/18/2022 68.60  >60.00 mL/min Final   Calculated using the CKD-EPI Creatinine Equation (2021)   Calcium 09/18/2022 9.1  8.4 - 10.5 mg/dL Final   Hgb A1c MFr Bld 09/18/2022 7.2 (H)  4.6 - 6.5 % Final   Glycemic Control Guidelines for People with Diabetes:Non Diabetic:  <6%Goal of Therapy: <7%Additional Action  Suggested:  >8%      Allergies as of 09/21/2022       Reactions   Rosuvastatin Calcium    Headaches, fatigue        Medication List        Accurate as of September 21, 2022  9:49 AM. If you have any questions, ask your nurse or doctor.          ASHWAGANDHA PO Take 380 mg by mouth daily.   aspirin EC 81 MG tablet Take 81 mg by mouth daily. Swallow whole.   CoQ10 200 MG Caps Take 200 mg by mouth daily.   Dexcom G6 Sensor Misc by Does not apply route.   Dexcom G6 Transmitter Misc by Does not apply route.   diltiazem 30 MG tablet Commonly known as: CARDIZEM Take 30 mg by mouth daily.   Eliquis 5 MG Tabs tablet Generic drug: apixaban TAKE 1 TABLET BY MOUTH TWICE A DAY   ezetimibe 10 MG tablet Commonly known as: ZETIA TAKE 1 TABLET BY MOUTH EVERY DAY   GLUCOSAMINE CHOND MSM FORMULA PO Take 15 mLs by mouth daily.   glucose blood test strip Commonly known as: ONE TOUCH ULTRA TEST Use asto check blood sugar 5 times a day   losartan 25 MG tablet Commonly known as: COZAAR TAKE 1 TABLET BY MOUTH EVERY DAY   multivitamin capsule Glycemic Balance-Taking 2 capsules by mouth daily-   MULTIVITAMIN PO Take 12.5 mg by mouth daily. Iodoral-   MULTIVITAMIN PO Take 50 mcg by mouth daily. Cholestar- Vitamin K27-   MULTIVITAMIN ADULT PO Take 1 tablet by mouth daily. Memo Max Pro-   MULTIVITAMIN ADULT PO Take 100 mg by mouth every morning. - Estrogen supplement   MULTIVITAMIN ADULT PO Take 1 capsule by mouth 2 (two) times daily. Neurovision- For eyes and immune Health   NON FORMULARY Take 15 mLs by mouth daily. MAG CAL VANILLA LIQUID   NON FORMULARY Take 2 capsules by mouth every morning. Keraflex-   NovoLOG 100 UNIT/ML injection Generic drug: insulin aspart USE MAX OF 60 UNITS DAILY VIA INSULIN PUMP.   RED YEAST RICE PO Take 1,200 mg by mouth daily in the afternoon.   RESVERATROL PO Take 960 mg by mouth daily.   SUPER OMEGA-3 PO Take 2 capsules by  mouth daily.   thyroid 30 MG tablet Commonly known as: Armour Thyroid TAKE 1 TABLET DAILY ALONG WITH THE 15 MG TABLET FOR A TOTAL OF 45 MG DAILY   thyroid 15 MG tablet Commonly known as: Armour Thyroid TAKE 1 TABLET DAILY WITH THE 30 MG TABLET (TOTAL OF 45 MG DAILY)   Turmeric Curcumin 500 MG Caps Take 482 mg by mouth daily.   Vitamin B-12 5000 MCG Subl Place 5,000 mcg under the tongue daily.   vitamin C 1000 MG tablet Take 1,000 mg by mouth daily.   VITAMIN D PO Take 1 drop by mouth daily. vitamin d3 plus k2        Allergies:  Allergies  Allergen Reactions   Rosuvastatin Calcium     Headaches, fatigue    Past Medical History:  Diagnosis Date   Anxiety    new dx   Atrial fibrillation ([place])    a. 03/2015 s/p RFCA;  b. CHA2DS2VASc = 5-->Eliquis.   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 12/2669 - due to AF RVR;  b. 03/2015 Echo: EF 60-65%, no rwma.   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 for Mets.: ER/PR POs., Her 2 Neu negative, Ki-67 12%   COLONOSCOPY     1 polyp    ELECTROPHYSIOLOGIC STUDY N/A 04/08/2015   PVI + CTI ablation + posterior Box lesion + Atach ablation by [name]   OVARIAN CYST SURGERY     TEE WITHOUT CARDIOVERSION N/A 04/07/2015   Procedure: TRANSESOPHAGEAL ECHOCARDIOGRAM (TEE);   Surgeon: Thayer Headings, MD;  Location: [facility] ENDOSCOPY;  Service: Cardiovascular;  Laterality: N/A;   TONSILLECTOMY      Family History  Problem Relation Age of Onset   Breast cancer Mother 45   Heart failure Mother    Prostate cancer Maternal Uncle        died in his 93s   Heart attack Father    Diabetes Maternal Uncle 56    Social History:  reports that she has never smoked. She has never used smokeless tobacco. She reports that she does not drink alcohol and does not use drugs.  REVIEW of systems:     She has a history of primary hypothyroidism    She is now being managed here with Armour Thyroid which she prefers Not taking consistent dose of 45 mg daily before breakfast No unusual symptoms of fatigue  TSH has been subsequently normal  Free T4 and T3 are consistently normal  Lab Results  Component Value Date   TSH 1.78 09/18/2022   TSH 0.99 05/28/2022   TSH 3.02 01/22/2022   FREET4 1.06 09/18/2022   FREET4 1.05 09/19/2021   FREET4 0.90 05/16/2021   Lab Results  Component Value Date   T3FREE 3.5 09/19/2021   T3FREE 3.4 05/16/2021     HYPERCHOLESTEROLEMIA: Her LDL has been persistently high and refuses to take a statin drug including pravastatin even though it had no side effects previously. Previously her LDL particle number was high at 1621  She was given a prescription for Crestor which she tried for 3-4 weeks but did not continue because she felt it was giving her headaches and making her tired  She prefers to use natural remedies but these have not been very effective She is still using the same OTC remedy without recent benefit   She is taking Zetia regularly as prescribed and has not missed any refills lately  LDL now 136 and has been as low as 99 this year Usually has a good diet  Her LDL levels as below   Lab Results  Component Value Date   CHOL 222 (H) 09/18/2022   CHOL 203 (H) 02/10/2022   CHOL 185 01/22/2022   Lab Results  Component Value  Date   HDL 71.30 09/18/2022   HDL 81 02/10/2022   HDL 73.80 01/22/2022   Lab Results  Component Value Date   LDLCALC 136 (H) 09/18/2022   LDLCALC 113 (H) 02/10/2022   LDLCALC 99 01/22/2022   Lab Results  Component Value Date   TRIG 71.0 09/18/2022   TRIG 47 02/10/2022   TRIG 65.0 01/22/2022   Lab Results  Component Value Date   CHOLHDL 3 09/18/2022   CHOLHDL 2.5 02/10/2022   CHOLHDL 3 01/22/2022   Lab Results  Component Value Date   LDLDIRECT 90.0 05/28/2022   LDLDIRECT 148.4 08/06/2013   .  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 been usually mild, longstanding, not on any diuretics or SSRIs  Urine osmolality as of 04/2015 was 618 She was told to cut back on her fluid intake  Sodium is variable but generally between 129-134   Lab Results  Component Value Date   NA 134 (L) 09/18/2022   K 4.3 09/18/2022   CL 97 09/18/2022   CO2 31 09/18/2022   She refused the second COVID-vaccine  Also refuses the influenza vaccine   EXAM:  BP 138/78   Pulse 63   Ht _0  (1.626 m)   Wt 154 lb (69.9 kg)   SpO2 93%   BMI 26.43 kg/m     ASSESSMENT:   DIABETES type I on T-Slim pump  See history of present illness for detailed discussion of current diabetes management, blood sugar patterns and problems identified  Her A1c is still excellent at 7.2  Considering her age and duration of diabetes her blood sugar control is excellent No evidence of diabetes complications  Recently 84% within target range and no hypoglycemia lately  Blood sugars are still not consistently controlled at mealtimes/snacks based on her missing boluses or not anticipating needing to bolus for certain foods, also occasionally may under bolus However this is not consistent and highest blood sugar at any given segment of the day is only 160  Urine microalbumin normal  RECOMMENDATIONS:  No change in pump settings at this time Again reminded her to bolus for all carbohydrates, anything with relatively high  fat content, fruits and also coffee in the morning  SIADH and hyponatremia: This is mild and sodium is stable at 134 Still needs to avoid excessive fluid intake   Hyperlipidemia: Well-controlled and LDL significantly higher despite taking Zetia Discussed Repatha and Leqvio and she may be interested in [place] if this is covered and will have her look into this as well as check on the coverage  Hypothyroidism: TSH is consistently normal using supplement of 45 mg of Armour Thyroid   Follow-up in 4 months  Mckinsey Keagle 09/21/2022, 9:49 AM

## 2022-09-25 ENCOUNTER — Encounter: Payer: Self-pay | Admitting: Endocrinology

## 2022-10-04 ENCOUNTER — Other Ambulatory Visit: Payer: Self-pay | Admitting: Nurse Practitioner

## 2022-10-23 DIAGNOSIS — E1065 Type 1 diabetes mellitus with hyperglycemia: Secondary | ICD-10-CM | POA: Diagnosis not present

## 2022-10-30 ENCOUNTER — Encounter: Payer: PPO | Attending: Endocrinology | Admitting: Nutrition

## 2022-10-30 DIAGNOSIS — E109 Type 1 diabetes mellitus without complications: Secondary | ICD-10-CM | POA: Diagnosis not present

## 2022-10-31 NOTE — Progress Notes (Signed)
Patient is here with husband, because she wants to upgrade her pump to use the Dexcom G7 sensors. She did not do the training on line before coming in.  She did this here, and it took an extra 45 minutes.  Pump upgrade was completed, but patient did not know her apple ID so not able to download new version of T-connect on her phone.  She will go by the apple store to fix this--she has an old email account that needs updating.  She will go home to fill a new cartridge and she will call me tomorrow after she downloads the t-connect app for help with linking this to her her pump.

## 2022-10-31 NOTE — Patient Instructions (Signed)
Go home to fill cartridge and restart pump Go to apple store and get new email update with password Download t-connect account Link pump to t-connect account

## 2022-11-22 ENCOUNTER — Encounter (HOSPITAL_COMMUNITY): Payer: Self-pay | Admitting: *Deleted

## 2022-11-22 DIAGNOSIS — E1065 Type 1 diabetes mellitus with hyperglycemia: Secondary | ICD-10-CM | POA: Diagnosis not present

## 2022-12-11 ENCOUNTER — Telehealth: Payer: Self-pay | Admitting: Nutrition

## 2022-12-11 MED ORDER — INSULIN ASPART 100 UNIT/ML IJ SOLN: INTRAMUSCULAR | 3 refills | Status: DC

## 2022-12-11 NOTE — Telephone Encounter (Signed)
done 

## 2022-12-11 NOTE — Telephone Encounter (Signed)
Patient is requesting that her Novolog prescription be sent to [facility].

## 2022-12-24 DIAGNOSIS — E1065 Type 1 diabetes mellitus with hyperglycemia: Secondary | ICD-10-CM | POA: Diagnosis not present

## 2022-12-24 DIAGNOSIS — H349 Unspecified retinal vascular occlusion: Secondary | ICD-10-CM | POA: Diagnosis not present

## 2022-12-24 DIAGNOSIS — H3412 Central retinal artery occlusion, left eye: Secondary | ICD-10-CM | POA: Diagnosis not present

## 2023-01-04 ENCOUNTER — Other Ambulatory Visit: Payer: Self-pay | Admitting: Pharmacist

## 2023-01-04 DIAGNOSIS — I4819 Other persistent atrial fibrillation: Secondary | ICD-10-CM

## 2023-01-04 MED ORDER — APIXABAN 5 MG PO TABS: 5.0000 mg | ORAL_TABLET | Freq: Two times a day (BID) | ORAL | 1 refills | Status: DC

## 2023-01-09 DIAGNOSIS — D6869 Other thrombophilia: Secondary | ICD-10-CM | POA: Diagnosis not present

## 2023-01-09 DIAGNOSIS — H353210 Exudative age-related macular degeneration, right eye, stage unspecified: Secondary | ICD-10-CM | POA: Diagnosis not present

## 2023-01-09 DIAGNOSIS — G8929 Other chronic pain: Secondary | ICD-10-CM | POA: Diagnosis not present

## 2023-01-09 DIAGNOSIS — H5452A1 Low vision left eye category 1, normal vision right eye: Secondary | ICD-10-CM | POA: Diagnosis not present

## 2023-01-09 DIAGNOSIS — E663 Overweight: Secondary | ICD-10-CM | POA: Diagnosis not present

## 2023-01-09 DIAGNOSIS — E039 Hypothyroidism, unspecified: Secondary | ICD-10-CM | POA: Diagnosis not present

## 2023-01-09 DIAGNOSIS — Z7982 Long term (current) use of aspirin: Secondary | ICD-10-CM | POA: Diagnosis not present

## 2023-01-09 DIAGNOSIS — E1069 Type 1 diabetes mellitus with other specified complication: Secondary | ICD-10-CM | POA: Diagnosis not present

## 2023-01-09 DIAGNOSIS — M8580 Other specified disorders of bone density and structure, unspecified site: Secondary | ICD-10-CM | POA: Diagnosis not present

## 2023-01-09 DIAGNOSIS — E785 Hyperlipidemia, unspecified: Secondary | ICD-10-CM | POA: Diagnosis not present

## 2023-01-09 DIAGNOSIS — I10 Essential (primary) hypertension: Secondary | ICD-10-CM | POA: Diagnosis not present

## 2023-01-09 DIAGNOSIS — I480 Paroxysmal atrial fibrillation: Secondary | ICD-10-CM | POA: Diagnosis not present

## 2023-01-12 ENCOUNTER — Emergency Department (HOSPITAL_BASED_OUTPATIENT_CLINIC_OR_DEPARTMENT_OTHER)
Admission: EM | Admit: 2023-01-12 | Discharge: 2023-01-12 | Disposition: A | Discharge status: Home / Self Care | Payer: PPO | Attending: Emergency Medicine | Admitting: Emergency Medicine

## 2023-01-12 ENCOUNTER — Emergency Department (HOSPITAL_BASED_OUTPATIENT_CLINIC_OR_DEPARTMENT_OTHER): Payer: PPO | Admitting: Radiology

## 2023-01-12 ENCOUNTER — Other Ambulatory Visit: Payer: Self-pay

## 2023-01-12 DIAGNOSIS — I10 Essential (primary) hypertension: Secondary | ICD-10-CM | POA: Insufficient documentation

## 2023-01-12 DIAGNOSIS — Z7901 Long term (current) use of anticoagulants: Secondary | ICD-10-CM | POA: Insufficient documentation

## 2023-01-12 DIAGNOSIS — M7732 Calcaneal spur, left foot: Secondary | ICD-10-CM | POA: Diagnosis not present

## 2023-01-12 DIAGNOSIS — Z794 Long term (current) use of insulin: Secondary | ICD-10-CM | POA: Diagnosis not present

## 2023-01-12 DIAGNOSIS — M79672 Pain in left foot: Secondary | ICD-10-CM | POA: Diagnosis not present

## 2023-01-12 DIAGNOSIS — E119 Type 2 diabetes mellitus without complications: Secondary | ICD-10-CM | POA: Insufficient documentation

## 2023-01-12 NOTE — ED Provider Notes (Signed)
[facility] Provider Note   CSN: [id number] Arrival date & time: 01/12/23  1045     History  Chief Complaint  Patient presents with   Foot Pain    Gina [name] is a 77 y.o. female.  Patient with history of hypertension and diabetes presents today with complaints of left heel pain. She states that same has been ongoing for the past 2 months and has been persistent and unchanged since onset. She denies any trauma. She originally went to her chiropractor who suspected she had a bone spur and has been treating her for same with minimal relief. States that given her minimal relief, her chiropractor recommended she come in to have an x-ray. She presents for same. She is able to walk, states that her pain is worse the more she walks on it. It is not worst in the morning or relieved with activity. Denies fevers or chills.  If she stays off of her feet, she is pain-free.  The history is provided by the patient. No language interpreter was used.  Foot Pain       Home Medications Prior to Admission medications   Medication Sig Start Date End Date Taking? Authorizing Provider  apixaban (ELIQUIS) 5 MG TABS tablet Take 1 tablet (5 mg total) by mouth 2 (two) times daily. 01/04/23   Sherran Needs, NP  Ascorbic Acid (VITAMIN C) 1000 MG tablet Take 1,000 mg by mouth daily.     [provider]  ASHWAGANDHA PO Take 380 mg by mouth daily.    [provider]  aspirin EC 81 MG tablet Take 81 mg by mouth daily. Swallow whole.    [provider]  Cholecalciferol (VITAMIN D PO) Take 1 drop by mouth daily. vitamin d3 plus k2    [provider]  Coenzyme Q10 (COQ10) 200 MG CAPS Take 200 mg by mouth daily.    [provider]  Continuous Blood Gluc Sensor (DEXCOM G6 SENSOR) MISC by Does not apply route.    [provider]  Continuous Blood Gluc Transmit (DEXCOM G6 TRANSMITTER) MISC by Does not apply route.     [provider]  Cyanocobalamin (VITAMIN B-12) 5000 MCG SUBL Place 5,000 mcg under the tongue daily.    [provider]  diltiazem (CARDIZEM) 30 MG tablet Take 30 mg by mouth daily.    [provider]  ezetimibe (ZETIA) 10 MG tablet TAKE 1 TABLET BY MOUTH EVERY DAY 08/09/22   Elayne Snare, MD  glucose blood (ONE TOUCH ULTRA TEST) test strip Use asto check blood sugar 5 times a day 11/07/16   Elayne Snare, MD  insulin aspart (NOVOLOG) 100 UNIT/ML injection USE MAX OF 60 UNITS DAILY VIA INSULIN PUMP. 12/11/22   Elayne Snare, MD  losartan (COZAAR) 25 MG tablet TAKE 1 TABLET BY MOUTH EVERY DAY 10/04/22   Sherran Needs, NP  Misc Natural Products ([place] MSM FORMULA PO) Take 15 mLs by mouth daily.    [provider]  Multiple Vitamin (MULTIVITAMIN ADULT PO) Take 1 tablet by mouth daily. Memo Max Pro-    [provider]  Multiple Vitamin (MULTIVITAMIN ADULT PO) Take 100 mg by mouth every morning. - Estrogen supplement    [provider]  Multiple Vitamin (MULTIVITAMIN ADULT PO) Take 1 capsule by mouth 2 (two) times daily. Neurovision- For eyes and immune Health    [provider]  Multiple Vitamin (MULTIVITAMIN PO) Take 12.5 mg by mouth daily. Iodoral-  [provider]  Multiple Vitamin (MULTIVITAMIN PO) Take 50 mcg by mouth daily. Cholestar- Vitamin K27-    [provider]  Multiple Vitamin (MULTIVITAMIN) capsule Glycemic Balance-Taking 2 capsules by mouth daily-    [provider]  NON FORMULARY Take 15 mLs by mouth daily. MAG CAL VANILLA LIQUID    [provider]  NON FORMULARY Take 2 capsules by mouth every morning. Keraflex-    [provider]  Omega-3 Fatty Acids (SUPER OMEGA-3 PO) Take 2 capsules by mouth daily.    [provider]  Red Yeast Rice Extract (RED YEAST RICE PO) Take 1,200 mg by mouth daily in the afternoon.    [provider]  RESVERATROL PO Take 960  mg by mouth daily.    [provider]  thyroid (ARMOUR THYROID) 15 MG tablet TAKE 1 TABLET DAILY WITH THE 30 MG TABLET (TOTAL OF 45 MG DAILY) 07/18/22   Elayne Snare, MD  thyroid [facility] THYROID) 30 MG tablet TAKE 1 TABLET DAILY ALONG WITH THE 15 MG TABLET FOR A TOTAL OF 45 MG DAILY 04/16/22   Elayne Snare, MD  Turmeric Curcumin 500 MG CAPS Take 482 mg by mouth daily.    [provider]      Allergies    Rosuvastatin calcium    Review of Systems   Review of Systems  Musculoskeletal:  Positive for arthralgias.  All other systems reviewed and are negative.   Physical Exam Updated Vital Signs BP (!) 171/74 (BP Location: Right Arm)   Pulse 75   Temp 98.2 F (36.8 C)   Resp 16   Ht 5\' 4"  (1.626 m)   Wt 68.9 kg   SpO2 98%   BMI 26.09 kg/m  Physical Exam Vitals and nursing note reviewed.  Constitutional:      General: She is not in acute distress.    Appearance: Normal appearance. She is normal weight. She is not ill-appearing, toxic-appearing or diaphoretic.  HENT:     Head: Normocephalic and atraumatic.  Cardiovascular:     Rate and Rhythm: Normal rate.  Pulmonary:     Effort: Pulmonary effort is normal. No respiratory distress.  Musculoskeletal:        General: Normal range of motion.     Cervical back: Normal range of motion.     Comments: TTP of the bottom of the left heel. No erythema, warmth, crepitus, fluctuance, or induration. DP and PT pulses intact and 2+. Capillary refill less than 2 seconds.  ROM intact.  Ambulatory with steady gait.  Skin:    General: Skin is warm and dry.  Neurological:     General: No focal deficit present.     Mental Status: She is alert.  Psychiatric:        Mood and Affect: Mood normal.        Behavior: Behavior normal.     ED Results / Procedures / Treatments   Labs (all labs ordered are listed, but only abnormal results are displayed) Labs Reviewed - No data to display  EKG None  Radiology DG Foot Complete  Left  Result Date: 01/12/2023 CLINICAL DATA:  Two-month history of left heel pain. No known injury. EXAM: LEFT FOOT - COMPLETE 3 VIEW COMPARISON:  None Available. FINDINGS: There is no evidence of fracture or dislocation. Plantar calcaneal spur. Degenerative changes of the foot. Soft tissues are unremarkable. IMPRESSION: 1. No acute fracture or dislocation. 2. Plantar calcaneal spur. Electronically Signed   By: Shawn Route.D.  On: 01/12/2023 12:18    Procedures Procedures    Medications Ordered in ED Medications - No data to display  ED Course/ Medical Decision Making/ A&P                             Medical Decision Making Amount and/or Complexity of Data Reviewed Radiology: ordered.   Patient presents today with complaints of left heel pain x 2 months.  She is afebrile, nontoxic-appearing, and in no acute distress with reassuring vital signs.  Physical exam reveals TTP of the left bottom of the heel area.  No erythema, warmth, fluctuance, induration, overlying skin changes.  X-ray imaging obtained which shows a plantar calcaneal spur likely causing the patient's symptoms.  I have personally reviewed and interpreted this imaging and agree with radiology interpretation.  No signs of joint effusion, septic arthritis, or other emergent etiology of symptoms.  Will give referral to orthopedics for follow-up management of this diagnosis.  Will also recommend rest, ice, compression, and elevation with Tylenol as needed for pain. Evaluation and diagnostic testing in the emergency department does not suggest an emergent condition requiring admission or immediate intervention beyond what has been performed at this time.  Plan for discharge with close PCP follow-up.  Patient is understanding and amenable with plan, educated on red flag symptoms that would prompt immediate return.  Patient discharged in stable condition.   Final Clinical Impression(s) / ED Diagnoses Final diagnoses:  Calcaneal spur of  foot, left    Rx / DC Orders ED Discharge Orders     None     An After Visit Summary was printed and given to the patient.     Nestor Lewandowsky 01/12/23 1326    Elgie Congo, MD 01/12/23 [phone]

## 2023-01-12 NOTE — Discharge Instructions (Signed)
As we discussed, x-ray imaging of your foot revealed that you do have a heel spur.  I have given you a referral to orthopedics for follow-up of this.  Please call them to schedule an appointment at your earliest convenience.  I also recommend that you rest, ice, compress, and elevate your foot and take Tylenol as needed for pain.  I have also attached an informational packet with additional recommendations regarding this diagnosis.  Return if development of any new or worsening symptoms.

## 2023-01-12 NOTE — ED Triage Notes (Signed)
Pt POV, caox4, NAD c/o L foot pain in heel x2 mo, pt denies pain while at rest, states pain is weight bearing. Pt denies any injury to the foot.

## 2023-01-12 NOTE — ED Notes (Signed)
Pt ready to go, was walking out of the room, talked pt into staying for dc papers, does not want vs

## 2023-01-16 ENCOUNTER — Telehealth: Payer: Self-pay

## 2023-01-16 NOTE — Telephone Encounter (Signed)
     Patient  visit on 01/12/2023  at [facility] was for left heel pain.  Have you been able to follow up with your primary care physician? Patient was instructed to stay off her heel for the next few days. Patient stated she will follow up with Orthopedic surgeon.  The patient was or was not able to obtain any needed medicine or equipment. No medications prescribed.  Are there diet recommendations that you are having difficulty following? No  Patient expresses understanding of discharge instructions and education provided has no other needs at this time. Yes   [place] Resource Care Guide   [email or web address]  ?? [id number]   Website: [email or web address]  [email or web address]

## 2023-01-17 ENCOUNTER — Other Ambulatory Visit (INDEPENDENT_AMBULATORY_CARE_PROVIDER_SITE_OTHER): Payer: PPO

## 2023-01-17 DIAGNOSIS — E109 Type 1 diabetes mellitus without complications: Secondary | ICD-10-CM | POA: Diagnosis not present

## 2023-01-17 LAB — HEMOGLOBIN A1C: Hgb A1c MFr Bld: 7.1 % — ABNORMAL HIGH (ref 4.6–6.5)

## 2023-01-17 LAB — BASIC METABOLIC PANEL
BUN: 10 mg/dL (ref 6–23)
CO2: 31 mEq/L (ref 19–32)
Calcium: 9.2 mg/dL (ref 8.4–10.5)
Chloride: 98 mEq/L (ref 96–112)
Creatinine, Ser: 0.8 mg/dL (ref 0.40–1.20)
GFR: 70.5 mL/min (ref >60.00)
Glucose, Bld: 201 mg/dL — ABNORMAL HIGH (ref 70–99)
Potassium: 4.5 mEq/L (ref 3.5–5.1)
Sodium: 131 mEq/L — ABNORMAL LOW (ref 135–145)

## 2023-01-18 ENCOUNTER — Other Ambulatory Visit: Payer: PPO

## 2023-01-22 ENCOUNTER — Encounter: Payer: Self-pay | Admitting: Endocrinology

## 2023-01-22 ENCOUNTER — Ambulatory Visit (INDEPENDENT_AMBULATORY_CARE_PROVIDER_SITE_OTHER): Payer: PPO | Admitting: Endocrinology

## 2023-01-22 VITALS — BP 114/76 | HR 62 | Ht 64.0 in | Wt 154.0 lb

## 2023-01-22 DIAGNOSIS — E109 Type 1 diabetes mellitus without complications: Secondary | ICD-10-CM

## 2023-01-22 DIAGNOSIS — E871 Hypo-osmolality and hyponatremia: Secondary | ICD-10-CM

## 2023-01-22 DIAGNOSIS — E7800 Pure hypercholesterolemia, unspecified: Secondary | ICD-10-CM | POA: Diagnosis not present

## 2023-01-22 DIAGNOSIS — E038 Other specified hypothyroidism: Secondary | ICD-10-CM

## 2023-01-22 NOTE — Progress Notes (Signed)
Patient ID: Gina [name], female   DOB: 1946/05/06, 77 y.o.   MRN: [id number]     Reason for Appointment: Insulin Pump followup:   History of Present Illness   Diagnosis: Type 1 DIABETES MELITUS, date of diagnosis:  1984      HISTORY:  T-Slim PUMP SETTINGS are:  12 AM-2 AM = 0.3, 2 AM-4 AM = 0.45, 4 AM- 6 AM = 1.1, 6 AM-8 AM = 1.0, 8 AM = 0.9, 11:30 AM = 1.0; 2 PM = 1.05, 6 PM = 0.7,10 PM-12 AM = 0.3  Total basal insulin 19 units, recent total daily insulin about 32 units  Carbohydrate ratio 1: 8 except 1: 7 from 2-6 PM  Blood sugar target 100.  Correction factor 1: 50 except 1: 40 2 PM-10 PM Active insulin 5 hours  Current management, blood sugar patterns and problems identified:  A1c is 7.1, previously 7.2  She was told to continue the settings unchanged on her last visit Also has been periodically reminded to make sure she is bolusing consistently before starting to eat but may not remember it is consistently However she does have periodic significantly high readings after some meals including when eating out and not adding extra for relatively higher fat meals or more carbohydrate As before occasionally will have higher readings in the morning despite not eating much carbs and a bolus 2 to 3 units on most days Likely highest readings are after lunch based on her diet Weight has been about the same Still is not linked with the T connect app and difficult to download her pump  Recently overall her blood sugar control is excellent with 85% within target range Again she has issues with hyperglycemia related to missed boluses or underestimating her boluses at mealtimes and possibly for snacks Also she was told to bolus for coffee in the morning which she is sometimes doing but not consistently, taking as much is 15 g bolus for coffee  no consistent time when she has hyperglycemia Overnight blood sugars are generally excellent and using the sleep mode as programmed  fairly good with her overall control as before  Highest blood sugars overall are in the late afternoon but not consistent Since she is not very active has less tendency to low sugars No recent weight change  CONTINUOUS GLUCOSE MONITORING RECORD INTERPRETATION    Glycemic patterns are as follows:   Overnight readings show some variability with somewhat higher readings in the early part of the night but generally in the low 100 range by 6 AM; HYPOGLYCEMIA has been rare only around 3-4 AM on 1 occasion and hyperglycemia mostly before 2 AM On an average postprandial readings are not rising significantly compared to Premeal readings Periodically will have significantly high readings after both lunch and dinner but average blood sugars are generally below 180 with highest readings midmorning and midafternoon and rarely in the evenings Hypoglycemia during the day has been minimal around 6-8 PM and not consistently Occasionally has hypoglycemia that may last for 2 to 3 hours after meals As below her average blood sugar is around 140 at all times Time in range is only slightly less than her last visit    CGM use % of time   2-week average/SD 141/35  Time in range 86  % Time Above 180 13  % Time above 250   % Time Below 70 1      PRE-MEAL  overnight  mornings  afternoon  evening Overall  Glucose range:       Averages: 138 143 146 136    Previous data:   CGM use % of time   2-week average/SD 140  Time in range     85   %  % Time Above 180 50  % Time above 250   % Time Below 70 0    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5 - 5.1 mEq/L Final   Chloride 01/17/2023 98  96 - 112 mEq/L Final   CO2 01/17/2023 31  19 - 32 mEq/L Final   Glucose, Bld 01/17/2023 201 (H)  70 - 99 mg/dL Final   BUN 16/10/960404/01/2023 10  6 - 23 mg/dL Final   Creatinine, Ser 01/17/2023 0.80  0.40 - 1.20 mg/dL Final   GFR 54/09/811904/01/2023 70.50  >60.00 mL/min Final   Calculated using the CKD-EPI Creatinine Equation (2021)   Calcium 01/17/2023 9.2  8.4 - 10.5 mg/dL Final   Hgb J4NA1c MFr Bld 01/17/2023 7.1 (H)  4.6 - 6.5 % Final   Glycemic Control Guidelines for People with Diabetes:Non Diabetic:  <6%Goal of Therapy: <7%Additional Action Suggested:  >8%      Allergies as of 01/22/2023       Reactions   Rosuvastatin Calcium    Headaches, fatigue        Medication List        Accurate as of January 22, 2023 10:21 AM. If you have any questions, ask your nurse or doctor.          apixaban 5 MG Tabs tablet Commonly known as: Eliquis Take 1 tablet (5 mg total) by mouth 2 (two) times daily.   ASHWAGANDHA PO Take 380 mg by mouth daily.   aspirin EC 81 MG tablet Take 81 mg by mouth daily. Swallow whole.   CoQ10 200 MG Caps Take 200 mg by mouth daily.   Dexcom G6 Sensor Misc by Does not apply route.   Dexcom G6 Transmitter Misc by Does not apply route.   diltiazem 30 MG tablet Commonly known as: CARDIZEM Take 30 mg by mouth daily.   ezetimibe 10 MG tablet Commonly known as: ZETIA TAKE 1 TABLET BY MOUTH EVERY DAY   GLUCOSAMINE CHOND MSM FORMULA PO Take 15 mLs by mouth daily.   glucose blood test strip Commonly known as: ONE TOUCH ULTRA TEST Use asto check blood sugar 5 times a day   insulin aspart 100 UNIT/ML injection Commonly known as: NovoLOG USE MAX OF 60 UNITS DAILY VIA INSULIN  PUMP.   losartan 25 MG tablet Commonly known as: COZAAR TAKE 1 TABLET BY MOUTH EVERY DAY   multivitamin capsule Glycemic Balance-Taking 2 capsules by mouth daily-   MULTIVITAMIN PO Take 12.5 mg by mouth daily. Iodoral-   MULTIVITAMIN PO Take  50 mcg by mouth daily. Cholestar- Vitamin K27-   MULTIVITAMIN ADULT PO Take 1 tablet by mouth daily. Memo Max Pro-   MULTIVITAMIN ADULT PO Take 100 mg by mouth every morning. - Estrogen supplement   MULTIVITAMIN ADULT PO Take 1 capsule by mouth 2 (two) times daily. Neurovision- For eyes and immune Health   NON FORMULARY Take 15 mLs by mouth daily. MAG CAL VANILLA LIQUID   NON FORMULARY Take 2 capsules by mouth every morning. Keraflex-   RED YEAST RICE PO Take 1,200 mg by mouth daily in the afternoon.   RESVERATROL PO Take 960 mg by mouth daily.   SUPER OMEGA-3 PO Take 2 capsules by mouth daily.   thyroid 30 MG tablet Commonly known as: Armour Thyroid TAKE 1 TABLET DAILY ALONG WITH THE 15 MG TABLET FOR A TOTAL OF 45 MG DAILY   thyroid 15 MG tablet Commonly known as: Armour Thyroid TAKE 1 TABLET DAILY WITH THE 30 MG TABLET (TOTAL OF 45 MG DAILY)   Turmeric Curcumin 500 MG Caps Take 482 mg by mouth daily.   Vitamin B-12 5000 MCG Subl Place 5,000 mcg under the tongue daily.   vitamin C 1000 MG tablet Take 1,000 mg by mouth daily.   VITAMIN D PO Take 1 drop by mouth daily. vitamin d3 plus k2        Allergies:  Allergies  Allergen Reactions   Rosuvastatin Calcium     Headaches, fatigue    Past Medical History:  Diagnosis Date   Anxiety    new dx   Atrial fibrillation    a. 03/2015 s/p RFCA;  b. CHA2DS2VASc = 5-->Eliquis.   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 12/2014 - due to AF RVR;  b. 03/2015 Echo: EF 60-65%, no rwma.    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 Node Positive for Mets.: ER/PR POs., Her 2 Neu negative, Ki-67 12%   COLONOSCOPY     1 polyp    ELECTROPHYSIOLOGIC STUDY N/A 04/08/2015   PVI + CTI ablation + posterior Box lesion + Atach ablation by Dr Johney Frame   OVARIAN CYST SURGERY     TEE WITHOUT CARDIOVERSION N/A 04/07/2015   Procedure: TRANSESOPHAGEAL ECHOCARDIOGRAM (TEE);  Surgeon: Vesta Mixer, MD;  Location: [facility] ENDOSCOPY;  Service: Cardiovascular;  Laterality: N/A;   TONSILLECTOMY      Family History  Problem Relation Age of Onset   Breast cancer Mother 70   Heart failure Mother    Prostate cancer Maternal Uncle        died in his 81s   Heart attack Father    Diabetes Maternal Uncle 45    Social History:  reports that she has never smoked. She has never used smokeless tobacco. She reports that she  does not drink alcohol and does not use drugs.  REVIEW of systems:     She has a history of primary hypothyroidism    She is now being managed here with Armour Thyroid which she prefers Not taking consistent dose of 45 mg daily before breakfast No unusual symptoms of fatigue  TSH has been subsequently normal  Free T4 and T3 are consistently normal  Lab Results  Component Value Date   TSH 1.78 09/18/2022   TSH 0.99 05/28/2022   TSH 3.02 01/22/2022   FREET4 1.06 09/18/2022   FREET4 1.05 09/19/2021   FREET4 0.90 05/16/2021   Lab Results  Component Value Date   T3FREE 3.5 09/19/2021   T3FREE 3.4 05/16/2021      HYPERCHOLESTEROLEMIA: Her LDL has been persistently high and refuses to take a statin drug including pravastatin even though it had no side effects previously. Previously her LDL particle number was high at 1621  She was given a prescription for Crestor which she tried for 3-4 weeks but did not continue because she felt it was giving her headaches and making her tired  She prefers to use natural remedies but these have not been very effective She is still using the same OTC remedy without recent benefit   She is taking Zetia regularly as prescribed and has not missed any refills lately LDL now 136 and has been as low as 99 this year Usually has a good diet  Her LDL levels as below   Lab Results  Component Value Date   CHOL 222 (H) 09/18/2022   CHOL 203 (H) 02/10/2022   CHOL 185 01/22/2022   Lab Results  Component Value Date   HDL 71.30 09/18/2022   HDL 81 02/10/2022   HDL 73.80 01/22/2022   Lab Results  Component Value Date   LDLCALC 136 (H) 09/18/2022   LDLCALC 113 (H) 02/10/2022   LDLCALC 99 01/22/2022   Lab Results  Component Value Date   TRIG 71.0 09/18/2022   TRIG 47 02/10/2022   TRIG 65.0 01/22/2022   Lab Results  Component Value Date   CHOLHDL 3 09/18/2022   CHOLHDL 2.5 02/10/2022   CHOLHDL 3 01/22/2022   Lab Results  Component Value Date   LDLDIRECT 90.0 05/28/2022   LDLDIRECT 148.4 08/06/2013   .  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 been usually mild, longstanding, not on any diuretics or SSRIs  Urine osmolality as of 04/2015 was 618 She was told to cut back on her fluid intake  Sodium is variable but generally between 129-134  Lab Results  Component Value Date   NA 131 (L) 01/17/2023   K 4.5 01/17/2023   CL 98 01/17/2023   CO2 31 01/17/2023   She refused the second COVID-vaccine  Also refuses the influenza vaccine   EXAM:  BP 114/76 (BP Location: Left Arm, Patient Position: Sitting, Cuff Size: Small)   Pulse 62   Ht 5\' 4"  (1.626 m)   Wt  154 lb (69.9 kg)   SpO2 97%   BMI 26.43 kg/m     ASSESSMENT:   DIABETES type I on T-Slim pump  See history of present illness for detailed discussion of current diabetes management, blood sugar patterns and problems identified  Her A1c is still excellent at 7.1  Again keeping in mind her age and duration of diabetes her blood sugar control is excellent No evidence of diabetes complications  Recently has 86 % within target range and minimal hypoglycemia   Blood sugars are on an  average fairly good but periodically will have postprandial hyperglycemia as discussed above  Urine microalbumin normal  RECOMMENDATIONS:  No change in pump settings Does need to have consistent boluses before starting to eat Also add extra for any relatively higher fat meals especially when eating out She may need to add a little more bolus to cover her coffee in the mornings Discussed that she can occasionally use the Dexcom sensor on her abdomen if she thinks there is some difficulty with the reading going to her pump She will also discuss further with diabetes educator Also needs to use activity mode when planning to be very active this summer  SIADH and hypo natremia: This is mild and sodium is in a stable range and asymptomatic Reminded her that she does need to restrict excessive fluids  Hyperlipidemia: She is only on Zetia and does need additional medications She has not been approached regarding coverage for the Leqvio and will do so again Patient information brochure given  Hypothyroidism: TSH is previously normal using supplement of 45 mg of Armour Thyroid and will follow-up on the next visit   Follow-up in 4 months  Brinklee Cisse 01/22/2023, 10:21 AM

## 2023-01-23 ENCOUNTER — Telehealth: Payer: Self-pay

## 2023-01-23 ENCOUNTER — Encounter: Payer: Self-pay | Admitting: Endocrinology

## 2023-01-23 NOTE — Telephone Encounter (Signed)
Pt can be referred to [facility] for Leqvio injections. Either by internal referral in Epic or Leqvio referral/order form completed and faxed to infusion center at [phone].

## 2023-01-24 ENCOUNTER — Other Ambulatory Visit: Payer: Self-pay | Admitting: Endocrinology

## 2023-01-24 DIAGNOSIS — E782 Mixed hyperlipidemia: Secondary | ICD-10-CM

## 2023-02-08 ENCOUNTER — Other Ambulatory Visit: Payer: Self-pay | Admitting: Endocrinology

## 2023-02-12 ENCOUNTER — Telehealth: Payer: Self-pay | Admitting: Pharmacy Technician

## 2023-02-12 NOTE — Telephone Encounter (Signed)
Dr. Lucianne Muss,  Lorain Childes note:  Auth Submission: APPROVED Site of care: Site of care: [facility] Payer: [employer] ADVT Medication & CPT/J Code(s) submitted: Leqvio (Inclisiran) J1306 Route of submission (phone, fax, portal):  Phone # [phone] Fax #[phone] Auth type: Buy/Bill Units/visits requested:2 Reference number: [id number] Approval from: 02/07/23 to 05/08/23   [employer]: Pending [employer] aware  Patient will be scheduled as soon as possible

## 2023-02-12 NOTE — Telephone Encounter (Signed)
Dr. Thyra Breed, Yes we will schedule her as soon as possbile

## 2023-02-22 ENCOUNTER — Telehealth: Payer: Self-pay

## 2023-02-22 NOTE — Telephone Encounter (Signed)
Patient came in to office today and picked up sample of Dexcom G7 Sensor.

## 2023-02-22 NOTE — Telephone Encounter (Signed)
Patient called asking for sample of G7 senor. Her insurance has sent one but her current one will go out before receiving it. She will pick up this pm. At front desk.

## 2023-02-27 ENCOUNTER — Telehealth: Payer: Self-pay

## 2023-02-27 NOTE — Telephone Encounter (Signed)
Patient states that the dexcom g7 she got from the office is giving her an error message with the tandem pump she has. She states that she has beenusing g7 for a while now and she is unsure why it is doing it. I told her to talk to pharmacist of called [facility] to see if they could help. I will also ask other CMA tomorrow when in office.

## 2023-02-28 NOTE — Telephone Encounter (Signed)
Patient was given 2 samples for G7 that are compatible with her pump. Gina [name] also put order into [facility] for patient as well as informed patient when the order had gone through.

## 2023-02-28 NOTE — Telephone Encounter (Signed)
Please make sure that the Dexcom that was given to her was "underlined" (capable of using with an insulin pump).  Ask patient to look on the box to see if their is an underline mark on the LBL label on the end  of the box.

## 2023-02-28 NOTE — Telephone Encounter (Signed)
Patient came in to office today and picked up sample of Dexcom G7.   Patient states that Randa Evens is telling her that they need a new prescription before the filled prescription requested will be sent to patient.  Patient is stating that she will not leave waiting room until the new prescription has been sent to [facility] and this message has been sent to the clinical staff.

## 2023-02-28 NOTE — Telephone Encounter (Signed)
Called and spoke with patient this morning. She states that it did not have the white line. She is coming in today to get another sample that is compatible with the pump.

## 2023-03-05 DIAGNOSIS — E1065 Type 1 diabetes mellitus with hyperglycemia: Secondary | ICD-10-CM | POA: Diagnosis not present

## 2023-03-13 ENCOUNTER — Other Ambulatory Visit: Payer: Self-pay | Admitting: Endocrinology

## 2023-04-03 ENCOUNTER — Telehealth: Payer: Self-pay

## 2023-04-03 NOTE — Telephone Encounter (Signed)
Will close referral until patient call us back wanting to receive injection. See below of attempts to contact her.  User  Comments  Loney Hering, LPN 10/20/1094 04:54 AM Called and spoke with pt, [place] did not have time to talk and said goodbye and hung up phone........Marland Kitchen Will wait until she returns our call??  Nat Math, RN 03/13/2023  9:52 AM Called to schedule injection. Pt states she does not wish to schedule until MD office calls her with results of lipid panel. Message sent to MD to reach out to pt.  Nat Math, RN 03/19/2023 11:51 AM Called to see if pt ready to schedule. States "not a good time".

## 2023-04-05 DIAGNOSIS — E1065 Type 1 diabetes mellitus with hyperglycemia: Secondary | ICD-10-CM | POA: Diagnosis not present

## 2023-04-24 DIAGNOSIS — E1065 Type 1 diabetes mellitus with hyperglycemia: Secondary | ICD-10-CM | POA: Diagnosis not present

## 2023-05-06 DIAGNOSIS — E1065 Type 1 diabetes mellitus with hyperglycemia: Secondary | ICD-10-CM | POA: Diagnosis not present

## 2023-05-21 ENCOUNTER — Other Ambulatory Visit (INDEPENDENT_AMBULATORY_CARE_PROVIDER_SITE_OTHER): Payer: PPO

## 2023-05-21 DIAGNOSIS — E038 Other specified hypothyroidism: Secondary | ICD-10-CM | POA: Diagnosis not present

## 2023-05-21 DIAGNOSIS — E7800 Pure hypercholesterolemia, unspecified: Secondary | ICD-10-CM | POA: Diagnosis not present

## 2023-05-21 DIAGNOSIS — E109 Type 1 diabetes mellitus without complications: Secondary | ICD-10-CM

## 2023-05-21 LAB — LIPID PANEL
Cholesterol: 203 mg/dL — ABNORMAL HIGH (ref 0–200)
HDL: 68.4 mg/dL (ref >39.00)
LDL Cholesterol: 121 mg/dL — ABNORMAL HIGH (ref 0–99)
NonHDL: 134.51
Total CHOL/HDL Ratio: 3
Triglycerides: 70 mg/dL (ref 0.0–149.0)
VLDL: 14 mg/dL (ref 0.0–40.0)

## 2023-05-21 LAB — COMPREHENSIVE METABOLIC PANEL
ALT: 12 U/L (ref 0–35)
AST: 16 U/L (ref 0–37)
Albumin: 4.1 g/dL (ref 3.5–5.2)
Alkaline Phosphatase: 69 U/L (ref 39–117)
BUN: 13 mg/dL (ref 6–23)
CO2: 26 mEq/L (ref 19–32)
Calcium: 9 mg/dL (ref 8.4–10.5)
Chloride: 94 mEq/L — ABNORMAL LOW (ref 96–112)
Creatinine, Ser: 0.88 mg/dL (ref 0.40–1.20)
GFR: 62.73 mL/min (ref >60.00)
Glucose, Bld: 304 mg/dL — ABNORMAL HIGH (ref 70–99)
Potassium: 4.5 mEq/L (ref 3.5–5.1)
Sodium: 130 mEq/L — ABNORMAL LOW (ref 135–145)
Total Bilirubin: 0.7 mg/dL (ref 0.2–1.2)
Total Protein: 6.5 g/dL (ref 6.0–8.3)

## 2023-05-21 LAB — TSH: TSH: 2.27 u[IU]/mL (ref 0.35–5.50)

## 2023-05-21 LAB — HEMOGLOBIN A1C: Hgb A1c MFr Bld: 7.3 % — ABNORMAL HIGH (ref 4.6–6.5)

## 2023-05-21 LAB — T4, FREE: Free T4: 1.5 ng/dL (ref 0.60–1.60)

## 2023-05-23 ENCOUNTER — Encounter: Payer: Self-pay | Admitting: Endocrinology

## 2023-05-23 ENCOUNTER — Ambulatory Visit: Payer: PPO | Admitting: Endocrinology

## 2023-05-23 VITALS — BP 126/70 | HR 83 | Ht 64.0 in | Wt 152.2 lb

## 2023-05-23 DIAGNOSIS — E038 Other specified hypothyroidism: Secondary | ICD-10-CM | POA: Diagnosis not present

## 2023-05-23 DIAGNOSIS — E871 Hypo-osmolality and hyponatremia: Secondary | ICD-10-CM | POA: Diagnosis not present

## 2023-05-23 DIAGNOSIS — E7800 Pure hypercholesterolemia, unspecified: Secondary | ICD-10-CM

## 2023-05-23 DIAGNOSIS — E1065 Type 1 diabetes mellitus with hyperglycemia: Secondary | ICD-10-CM | POA: Diagnosis not present

## 2023-05-23 NOTE — Patient Instructions (Addendum)
Bolus for 25 carbs in am for your usual breakfast  Try to bolus of Humalog before starting to eat  Exercise mode has been inactivated, use only when doing physical activity   Drink minimum fluids, no need to force fluids

## 2023-05-23 NOTE — Progress Notes (Signed)
Patient ID: Gina [name], female   DOB: 12-30-1945, 77 y.o.   MRN: [id number]     Reason for Appointment: Insulin Pump followup:   History of Present Illness   Diagnosis: Type 1 DIABETES MELITUS, date of diagnosis:  1984      HISTORY:  T-Slim PUMP SETTINGS are:  12 AM-2 AM = 0.3, 2 AM-4 AM = 0.45, 4 AM- 6 AM = 1.1, 6 AM-8 AM = 1.0, 8 AM = 0.9, 11:30 AM = 1.0; 2 PM = 1.05, 6 PM = 0.7,10 PM-12 AM = 0.3  Total basal insulin 19 units, recent total daily insulin about 32 units  Carbohydrate ratio 1: 8 except 1: 7 from 2-6 PM  Blood sugar target 100.  Correction factor 1: 50 except 1: 40 2 PM-10 PM Active insulin 5 hours  Current management, blood sugar patterns and problems identified:  A1c is stable at 7.3   Blood sugars are generally well-controlled and 84% within the target range Currently highest blood sugars are after breakfast and occasionally after dinner Although she is eating a relatively low carbohydrate meals breakfast and entering 18 g of carbohydrates blood sugars may periodically go up to around 200 or more Regular for some of her evening blood sugars are variable and occasionally low normal but does not Hyalase have carbohydrates at dinnertime Has occasional high sugars after meals related to higher fat meals, eating out or late boluses Recently has not done any physical exercise because of heel pain No hypoglycemia Overnight blood sugars are also generally well-controlled No recent weight change  CONTINUOUS GLUCOSE MONITORING RECORD INTERPRETATION    Glycemic patterns are as follows:   Overnight readings show some increase at times but not consistently and are still reasonably controlled without hypoglycemia  Highest blood sugars are after breakfast and occasionally in the afternoon or evenings with no consistent pattern.  Only after breakfast will her average blood sugar be over 180 Blood sugars during the day are generally in the mid to low 100  range before lunch and dinner On an average blood sugar is under 180 on average after lunch and dinner with a few exceptions and blood sugars well over 200 No hypoglycemia during the day Time in range is only slightly less than her last visit     CGM use % of time   2-week average/GV 148/25  Time in range        % 84  % Time Above 180 15  % Time above 250 1.3  % Time Below 70 0      Previously  CGM use % of time   2-week average/SD 141/35  Time in range 86  % Time Above 180 13  % Time above 250   % Time Below 70 1      PRE-MEAL  overnight  mornings  afternoon  evening Overall  Glucose range:       Averages: 138 143 146 136    Previous data:   CGM use % of time   2-week average/SD 140  Time in range     85   %  % Time Above 180 50  % Time above 250   % Time Below 70 0    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, housework and other activities   Wt Readings from Last 3 Encounters:  05/23/23 152 lb 3.2 oz (69 kg)  01/22/23 154 lb (69.9 kg)  01/12/23 152 lb (68.9 kg)    LABS:   Lab  Results  Component Value Date   HGBA1C 7.3 (H) 05/21/2023   HGBA1C 7.1 (H) 01/17/2023   HGBA1C 7.2 (H) 09/18/2022   Lab Results  Component Value Date   MICROALBUR 3.1 (H) 09/18/2022   LDLCALC 121 (H) 05/21/2023   CREATININE 0.88 05/21/2023    Other problems discussed today: Review of systems  Lab on 05/21/2023  Component Date Value Ref Range Status   Free T4 05/21/2023 1.50  0.60 - 1.60 ng/dL Final   Comment: Specimens from patients who are undergoing biotin therapy and /or ingesting biotin supplements may contain high levels of biotin.  The higher biotin concentration in these specimens interferes with this Free T4 assay.  Specimens that contain high levels  of biotin may cause false high results for this Free T4 assay.  Please interpret results in light of the total clinical presentation of the patient.     TSH 05/21/2023 2.27  0.35 - 5.50 uIU/mL Final   Cholesterol 05/21/2023  203 (H)  0 - 200 mg/dL Final   ATP III Classification       Desirable:  < 200 mg/dL               Borderline High:  200 - 239 mg/dL          High:  > = 161 mg/dL   Triglycerides 09/60/4540 70.0  0.0 - 149.0 mg/dL Final   Normal:  <981 mg/dLBorderline High:  150 - 199 mg/dL   HDL 19/14/7829 56.21  >39.00 mg/dL Final   VLDL 30/86/5784 14.0  0.0 - 40.0 mg/dL Final   LDL Cholesterol 05/21/2023 121 (H)  0 - 99 mg/dL Final   Total CHOL/HDL Ratio 05/21/2023 3   Final                  Men          Women1/2 Average Risk     3.4          3.3Average Risk          5.0          4.42X Average Risk          9.6          7.13X Average Risk          15.0          11.0                       NonHDL 05/21/2023 134.51   Final   NOTE:  Non-HDL goal should be 30 mg/dL higher than patient's LDL goal (i.e. LDL goal of < 70 mg/dL, would have non-HDL goal of < 100 mg/dL)   Sodium 69/62/9528 413 (L)  135 - 145 mEq/L Final   Potassium 05/21/2023 4.5  3.5 - 5.1 mEq/L Final   Chloride 05/21/2023 94 (L)  96 - 112 mEq/L Final   CO2 05/21/2023 26  19 - 32 mEq/L Final   Glucose, Bld 05/21/2023 304 (H)  70 - 99 mg/dL Final   BUN 24/40/1027 13  6 - 23 mg/dL Final   Creatinine, Ser 05/21/2023 0.88  0.40 - 1.20 mg/dL Final   Total Bilirubin 05/21/2023 0.7  0.2 - 1.2 mg/dL Final   Alkaline Phosphatase 05/21/2023 69  39 - 117 U/L Final   AST 05/21/2023 16  0 - 37 U/L Final   ALT 05/21/2023 12  0 - 35 U/L Final   Total Protein 05/21/2023 6.5  6.0 - 8.3  g/dL Final   Albumin 09/81/1914 4.1  3.5 - 5.2 g/dL Final   GFR 78/29/5621 62.73  >60.00 mL/min Final   Calculated using the CKD-EPI Creatinine Equation (2021)   Calcium 05/21/2023 9.0  8.4 - 10.5 mg/dL Final   Hgb H0Q MFr Bld 05/21/2023 7.3 (H)  4.6 - 6.5 % Final   Glycemic Control Guidelines for People with Diabetes:Non Diabetic:  <6%Goal of Therapy: <7%Additional Action Suggested:  >8%      Allergies as of 05/23/2023       Reactions   Rosuvastatin Calcium    Headaches,  fatigue        Medication List        Accurate as of May 23, 2023 10:14 AM. If you have any questions, ask your nurse or doctor.          apixaban 5 MG Tabs tablet Commonly known as: Eliquis Take 1 tablet (5 mg total) by mouth 2 (two) times daily.   ASHWAGANDHA PO Take 380 mg by mouth daily.   aspirin EC 81 MG tablet Take 81 mg by mouth daily. Swallow whole.   CoQ10 200 MG Caps Take 200 mg by mouth daily.   Dexcom G6 Sensor Misc by Does not apply route.   Dexcom G6 Transmitter Misc by Does not apply route.   diltiazem 30 MG tablet Commonly known as: CARDIZEM Take 30 mg by mouth daily.   ezetimibe 10 MG tablet Commonly known as: ZETIA TAKE 1 TABLET BY MOUTH EVERY DAY   GLUCOSAMINE CHOND MSM FORMULA PO Take 15 mLs by mouth daily.   glucose blood test strip Commonly known as: ONE TOUCH ULTRA TEST Use asto check blood sugar 5 times a day   insulin aspart 100 UNIT/ML injection Commonly known as: NovoLOG USE MAX OF 60 UNITS DAILY VIA INSULIN PUMP.   losartan 25 MG tablet Commonly known as: COZAAR TAKE 1 TABLET BY MOUTH EVERY DAY   multivitamin capsule Glycemic Balance-Taking 2 capsules by mouth daily-   MULTIVITAMIN PO Take 12.5 mg by mouth daily. Iodoral-   MULTIVITAMIN PO Take 50 mcg by mouth daily. Cholestar- Vitamin K27-   MULTIVITAMIN ADULT PO Take 1 tablet by mouth daily. Memo Max Pro-   MULTIVITAMIN ADULT PO Take 100 mg by mouth every morning. - Estrogen supplement   MULTIVITAMIN ADULT PO Take 1 capsule by mouth 2 (two) times daily. Neurovision- For eyes and immune Health   NON FORMULARY Take 15 mLs by mouth daily. MAG CAL VANILLA LIQUID   NON FORMULARY Take 2 capsules by mouth every morning. Keraflex-   RED YEAST RICE PO Take 1,200 mg by mouth daily in the afternoon.   RESVERATROL PO Take 960 mg by mouth daily.   SUPER OMEGA-3 PO Take 2 capsules by mouth daily.   thyroid 15 MG tablet Commonly known as: Armour Thyroid TAKE  1 TABLET DAILY WITH THE 30 MG TABLET (TOTAL OF 45 MG DAILY)   thyroid 30 MG tablet Commonly known as: Armour Thyroid TAKE ONE TABLET DAILY ALONG WITH THE 15 MG TABLET. (TOTAL DOSAGE OF 45 MG DAILY)   Turmeric Curcumin 500 MG Caps Take 482 mg by mouth daily.   Vitamin B-12 5000 MCG Subl Place 5,000 mcg under the tongue daily.   vitamin C 1000 MG tablet Take 1,000 mg by mouth daily.   VITAMIN D PO Take 1 drop by mouth daily. vitamin d3 plus k2        Allergies:  Allergies  Allergen Reactions   Rosuvastatin  Calcium     Headaches, fatigue    Past Medical History:  Diagnosis Date   Anxiety    new dx   Atrial fibrillation (HCC)    a. 03/2015 s/p RFCA;  b. CHA2DS2VASc = 5-->Eliquis.   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 12/2014 - due to AF RVR;  b. 03/2015 Echo: EF 60-65%, no rwma.   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 for Mets.: ER/PR POs., Her 2 Neu negative, Ki-67 12%   COLONOSCOPY     1 polyp    ELECTROPHYSIOLOGIC STUDY N/A 04/08/2015   PVI + CTI ablation + posterior Box lesion + Atach ablation by Dr Johney Frame   OVARIAN CYST SURGERY     TEE WITHOUT CARDIOVERSION N/A 04/07/2015   Procedure: TRANSESOPHAGEAL ECHOCARDIOGRAM (TEE);  Surgeon: Vesta Mixer, MD;  Location: [facility] ENDOSCOPY;  Service:  Cardiovascular;  Laterality: N/A;   TONSILLECTOMY      Family History  Problem Relation Age of Onset   Breast cancer Mother 47   Heart failure Mother    Prostate cancer Maternal Uncle        died in his 46s   Heart attack Father    Diabetes Maternal Uncle 69    Social History:  reports that she has never smoked. She has never used smokeless tobacco. She reports that she does not drink alcohol and does not use drugs.  REVIEW of systems:     She has a history of primary hypothyroidism    She is now being managed here with Armour Thyroid which she prefers She is taking a consistent dose of 45 mg daily before breakfast No unusual symptoms of fatigue  TSH has been normal  Free T4 and T3 are consistently normal  Lab Results  Component Value Date   TSH 2.27 05/21/2023   TSH 1.78 09/18/2022   TSH 0.99 05/28/2022   FREET4 1.50 05/21/2023   FREET4 1.06 09/18/2022   FREET4 1.05 09/19/2021   Lab Results  Component Value Date   T3FREE 3.5 09/19/2021   T3FREE 3.4 05/16/2021     HYPERCHOLESTEROLEMIA: Her LDL has been persistently high and refuses to take a statin drug including pravastatin even though it had no side effects previously. Previously her LDL particle number was high at 1621  She was given a prescription for Crestor which she tried for 3-4 weeks but did not continue because she felt it was giving her headaches and making her tired  She prefers to use natural remedies but these have not been very effective  She is taking Zetia regularly as prescribed Although her LDL has been fluctuating it is slightly better She has been recommended Leqvio which she agreed to try but because of high out-of-pocket  expense and no patient assistance available she is not taking this  Triglyceride is consistently normal Usually has a good diet  Her LDL levels as below   Lab Results  Component Value Date   CHOL 203 (H) 05/21/2023   CHOL 222 (H) 09/18/2022   CHOL 203 (H)  02/10/2022   Lab Results  Component Value Date   HDL 68.40 05/21/2023   HDL 71.30 09/18/2022   HDL 81 02/10/2022   Lab Results  Component Value Date   LDLCALC 121 (H) 05/21/2023   LDLCALC 136 (H) 09/18/2022   LDLCALC 113 (H) 02/10/2022   Lab Results  Component Value Date   TRIG 70.0 05/21/2023   TRIG 71.0 09/18/2022   TRIG 47 02/10/2022   Lab Results  Component Value Date   CHOLHDL 3 05/21/2023   CHOLHDL 3 09/18/2022   CHOLHDL 2.5 02/10/2022   Lab Results  Component Value Date   LDLDIRECT 90.0 05/28/2022   LDLDIRECT 148.4 08/06/2013   .  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 been usually mild, longstanding, not on any diuretics or SSRIs  Urine osmolality as of 04/2015 was 618 She was told to cut back on her fluid intake but she is not doing so and she says she is trying to drink more water since she thought this was healthy  Sodium is variable but generally between 129-134 Has not had any symptoms from hyponatremia  Lab Results  Component Value Date   NA 130 (L) 05/21/2023   K 4.5 05/21/2023   CL 94 (L) 05/21/2023   CO2 26 05/21/2023   She refused the second COVID-vaccine  Also refuses the influenza vaccine   EXAM:  BP 126/70   Pulse 83   Ht 5\' 4"  (1.626 m)   Wt 152 lb 3.2 oz (69 kg)   SpO2 97%   BMI 26.13 kg/m     ASSESSMENT:   DIABETES type I on T-Slim pump  See history of present illness for detailed discussion of current diabetes management, blood sugar patterns and problems identified  Her A1c is 7.3  Recently has 84 % within target range and minimal hypoglycemia  Despite long history of diabetes she has not had any complications Also no hypoglycemia  Blood sugars are on an average highest after breakfast as discussed above and periodically after some of her other meals but no consistent hyperglycemia Usually has a low carbohydrate diet  Currently she has no difficulty using her pump and Dexcom sensor but usually requires help making the  programming changes  RECOMMENDATIONS:  No change in pump settings To make up for the slightly higher fat intake at breakfast she will enter 25 g carbohydrate at breakfast instead of 18 Reminded her to bolus a few minute before starting to eat since this will be more effective   SIADH and hyponatremia: This is mild and asymptomatic Sodium is in a stable range around 130 However she can do better with fluid restriction as she is trying to increase her water intake We will check this periodically  Hyperlipidemia: She is only on Zetia and unable to afford Leqvio this year but may consider this next year  Hypothyroidism: TSH is previously normal using supplement of 45 mg of Armour Thyroid which she will continue  Suggested that she contact Dr. Tanya Nones to establish as a PCP since she does not have any for regular care and preventive measures  Follow-up in 4 months  Abdulloh Ullom 05/23/2023, 10:14 AM

## 2023-05-28 ENCOUNTER — Encounter: Payer: Self-pay | Admitting: Endocrinology

## 2023-06-14 DIAGNOSIS — E1065 Type 1 diabetes mellitus with hyperglycemia: Secondary | ICD-10-CM | POA: Diagnosis not present

## 2023-07-01 ENCOUNTER — Other Ambulatory Visit (HOSPITAL_COMMUNITY): Payer: Self-pay | Admitting: *Deleted

## 2023-07-01 DIAGNOSIS — M722 Plantar fascial fibromatosis: Secondary | ICD-10-CM | POA: Diagnosis not present

## 2023-07-01 DIAGNOSIS — M216X1 Other acquired deformities of right foot: Secondary | ICD-10-CM | POA: Diagnosis not present

## 2023-07-01 DIAGNOSIS — M216X2 Other acquired deformities of left foot: Secondary | ICD-10-CM | POA: Diagnosis not present

## 2023-07-01 DIAGNOSIS — M79672 Pain in left foot: Secondary | ICD-10-CM | POA: Diagnosis not present

## 2023-07-01 DIAGNOSIS — M7732 Calcaneal spur, left foot: Secondary | ICD-10-CM | POA: Diagnosis not present

## 2023-07-01 DIAGNOSIS — M2141 Flat foot [pes planus] (acquired), right foot: Secondary | ICD-10-CM | POA: Diagnosis not present

## 2023-07-01 DIAGNOSIS — I4819 Other persistent atrial fibrillation: Secondary | ICD-10-CM

## 2023-07-01 DIAGNOSIS — M249 Joint derangement, unspecified: Secondary | ICD-10-CM | POA: Diagnosis not present

## 2023-07-01 DIAGNOSIS — M2012 Hallux valgus (acquired), left foot: Secondary | ICD-10-CM | POA: Diagnosis not present

## 2023-07-01 DIAGNOSIS — E109 Type 1 diabetes mellitus without complications: Secondary | ICD-10-CM | POA: Diagnosis not present

## 2023-07-01 DIAGNOSIS — M2142 Flat foot [pes planus] (acquired), left foot: Secondary | ICD-10-CM | POA: Diagnosis not present

## 2023-07-01 MED ORDER — APIXABAN 5 MG PO TABS
5.0000 mg | ORAL_TABLET | Freq: Two times a day (BID) | ORAL | 2 refills | Status: DC
Start: 2023-07-01 — End: 2024-04-08

## 2023-07-02 DIAGNOSIS — H3412 Central retinal artery occlusion, left eye: Secondary | ICD-10-CM | POA: Diagnosis not present

## 2023-07-02 DIAGNOSIS — H04123 Dry eye syndrome of bilateral lacrimal glands: Secondary | ICD-10-CM | POA: Diagnosis not present

## 2023-07-02 DIAGNOSIS — H349 Unspecified retinal vascular occlusion: Secondary | ICD-10-CM | POA: Diagnosis not present

## 2023-07-22 DIAGNOSIS — M216X1 Other acquired deformities of right foot: Secondary | ICD-10-CM | POA: Diagnosis not present

## 2023-07-22 DIAGNOSIS — M722 Plantar fascial fibromatosis: Secondary | ICD-10-CM | POA: Diagnosis not present

## 2023-07-22 DIAGNOSIS — M216X2 Other acquired deformities of left foot: Secondary | ICD-10-CM | POA: Diagnosis not present

## 2023-07-22 DIAGNOSIS — M79672 Pain in left foot: Secondary | ICD-10-CM | POA: Diagnosis not present

## 2023-07-22 DIAGNOSIS — M7732 Calcaneal spur, left foot: Secondary | ICD-10-CM | POA: Diagnosis not present

## 2023-07-22 DIAGNOSIS — M2012 Hallux valgus (acquired), left foot: Secondary | ICD-10-CM | POA: Diagnosis not present

## 2023-07-22 DIAGNOSIS — M2141 Flat foot [pes planus] (acquired), right foot: Secondary | ICD-10-CM | POA: Diagnosis not present

## 2023-07-22 DIAGNOSIS — E109 Type 1 diabetes mellitus without complications: Secondary | ICD-10-CM | POA: Diagnosis not present

## 2023-07-22 DIAGNOSIS — M2142 Flat foot [pes planus] (acquired), left foot: Secondary | ICD-10-CM | POA: Diagnosis not present

## 2023-07-22 DIAGNOSIS — M249 Joint derangement, unspecified: Secondary | ICD-10-CM | POA: Diagnosis not present

## 2023-08-01 DIAGNOSIS — E109 Type 1 diabetes mellitus without complications: Secondary | ICD-10-CM | POA: Diagnosis not present

## 2023-09-06 DIAGNOSIS — E1065 Type 1 diabetes mellitus with hyperglycemia: Secondary | ICD-10-CM | POA: Diagnosis not present

## 2023-09-17 ENCOUNTER — Telehealth: Payer: Self-pay

## 2023-09-17 DIAGNOSIS — E038 Other specified hypothyroidism: Secondary | ICD-10-CM

## 2023-09-17 DIAGNOSIS — E1065 Type 1 diabetes mellitus with hyperglycemia: Secondary | ICD-10-CM

## 2023-09-17 DIAGNOSIS — E7800 Pure hypercholesterolemia, unspecified: Secondary | ICD-10-CM

## 2023-09-17 NOTE — Telephone Encounter (Signed)
Labs edited

## 2023-09-18 ENCOUNTER — Other Ambulatory Visit: Payer: Self-pay

## 2023-09-19 ENCOUNTER — Other Ambulatory Visit: Payer: PPO

## 2023-09-19 DIAGNOSIS — E7800 Pure hypercholesterolemia, unspecified: Secondary | ICD-10-CM | POA: Diagnosis not present

## 2023-09-19 DIAGNOSIS — E038 Other specified hypothyroidism: Secondary | ICD-10-CM | POA: Diagnosis not present

## 2023-09-19 DIAGNOSIS — E1065 Type 1 diabetes mellitus with hyperglycemia: Secondary | ICD-10-CM | POA: Diagnosis not present

## 2023-09-20 LAB — TSH: TSH: 2.69 m[IU]/L (ref 0.40–4.50)

## 2023-09-20 LAB — MICROALBUMIN / CREATININE URINE RATIO
Creatinine, Urine: 44 mg/dL (ref 20–275)
Microalb Creat Ratio: 5 mg/g{creat} (ref <30)
Microalb, Ur: 0.2 mg/dL

## 2023-09-20 LAB — BASIC METABOLIC PANEL
BUN: 12 mg/dL (ref 7–25)
CO2: 30 mmol/L (ref 20–32)
Calcium: 9.1 mg/dL (ref 8.6–10.4)
Chloride: 97 mmol/L — ABNORMAL LOW (ref 98–110)
Creat: 0.72 mg/dL (ref 0.60–1.00)
Glucose, Bld: 141 mg/dL — ABNORMAL HIGH (ref 65–99)
Potassium: 4.6 mmol/L (ref 3.5–5.3)
Sodium: 135 mmol/L (ref 135–146)

## 2023-09-20 LAB — HEMOGLOBIN A1C
Hgb A1c MFr Bld: 7.6 %{Hb} — ABNORMAL HIGH (ref <5.7)
Mean Plasma Glucose: 171 mg/dL
eAG (mmol/L): 9.5 mmol/L

## 2023-09-20 LAB — LIPID PANEL
Cholesterol: 238 mg/dL — ABNORMAL HIGH (ref <200)
HDL: 78 mg/dL (ref >=50)
LDL Cholesterol (Calc): 144 mg/dL — ABNORMAL HIGH
Non-HDL Cholesterol (Calc): 160 mg/dL — ABNORMAL HIGH (ref <130)
Total CHOL/HDL Ratio: 3.1 (calc) (ref <5.0)
Triglycerides: 61 mg/dL (ref <150)

## 2023-09-24 ENCOUNTER — Encounter: Payer: Self-pay | Admitting: Endocrinology

## 2023-09-24 ENCOUNTER — Ambulatory Visit: Payer: PPO | Admitting: Endocrinology

## 2023-09-24 VITALS — BP 112/62 | HR 72 | Resp 20 | Ht 64.0 in | Wt 155.2 lb

## 2023-09-24 DIAGNOSIS — E1065 Type 1 diabetes mellitus with hyperglycemia: Secondary | ICD-10-CM

## 2023-09-24 DIAGNOSIS — E782 Mixed hyperlipidemia: Secondary | ICD-10-CM

## 2023-09-24 DIAGNOSIS — E038 Other specified hypothyroidism: Secondary | ICD-10-CM | POA: Diagnosis not present

## 2023-09-24 DIAGNOSIS — E7800 Pure hypercholesterolemia, unspecified: Secondary | ICD-10-CM | POA: Diagnosis not present

## 2023-09-24 DIAGNOSIS — E871 Hypo-osmolality and hyponatremia: Secondary | ICD-10-CM

## 2023-09-24 NOTE — Progress Notes (Signed)
Outpatient Endocrinology Note Gina Deondre Marinaro, MD  09/24/23  Patient's Name: Gina [name]    DOB: February 12, 1946    MRN: [id number]                                                    REASON OF VISIT: Follow up for type 1 diabetes mellitus  PCP: Patient, No Pcp Per  HISTORY OF PRESENT ILLNESS:   Gina [name] is a 77 y.o. old female with past medical history listed below, is here for follow up for type 1 diabetes mellitus.   Pertinent Diabetes History: Patient was diagnosed with type 1 diabetes mellitus in 1984.    Chronic Diabetes Complications : Retinopathy: no. Last ophthalmology exam was done on annually, following with ophthalmology regularly.  Nephropathy: no, on losartan. Peripheral neuropathy: no Coronary artery disease: no Stroke: no  Relevant comorbidities and cardiovascular risk factors: Obesity: no Body mass index is 26.64 kg/m.  Hypertension: Yes   Hyperlipidemia : Yes, not on statin. Patient declined.  HYPERCHOLESTEROLEMIA: Her LDL has been persistently high and refuses to take a statin drug including pravastatin even though it had no side effects previously. Previously her LDL particle number was high at 1621. She was given a prescription for Crestor which she tried for 3-4 weeks but did not continue because she felt it was giving her headaches and making her tired. She prefers to use natural remedies but these have not been very effective.   She is taking Zetia regularly as prescribed.  She had been recommended Leqvio which she agreed to try but because of high out-of-pocket expense and no patient assistance available she is not taking this   Triglyceride is consistently normal Usually has a good diet   Current / Home Diabetic regimen includes:  Tandem t:slim insulin pump on control IQ using NovoLog U100.  Insulin Pump setting:  Basal MN- 0.300u/hour 2AM- 0.450  4AM- 1.10 6AM- 0.90 8AM-    0.900 11:30AM1.00 2PM-     1.050 6PM-     0.700 10PM-    0.300  Bolus CHO Ratio (1unit:CHO) MN- 1:8 2PM- 1:7 6PM- 1:8  Correction/Sensitivity: MN- 1:50 2PM- 1:40 10PM- 1:50  Target: 100   Active insulin time: 5 hours  Prior diabetic medications:   CONTINUOUS GLUCOSE MONITORING SYSTEM (CGMS) / INSULIN PUMP INTERPRETATION:                         Tandem Pump & Sensor Download (Reviewed and summarized below.) Pump: Dexcom G6 and Tandem t:slim Dates: Novmber 27 - September 24, 2023  Glucose Management Indicator: 6.9% Time in range :77%      Average total daily insulin:  32.56 units, Basal: 57%, Bolus: 43%,  (Manual Bolus: 68%,  Control IQ Bolus: 32%)    Trends:  Frequent hyperglycemia with meals with blood sugar up to 200-250 range, mostly with # sometime with supper and breakfast.  Hyperglycemia related to late meal bolus, occasionally missed meal bolus and probably inadequate meal bolus.  Blood sugar overnight and in between the meals are acceptable.  Rare mild hypoglycemia from his insulin stacking.  No concerning hypoglycemia.  Hypoglycemia: Patient has rare / minor hypoglycemic episodes. Patient has hypoglycemia awareness.    Factors modifying glucose control: 1.  Diabetic diet assessment: 3 meals a day.  2.  Staying active  or exercising:  Working on her farm, housework and other activities.  3.  Medication compliance: compliant all of the time.  # Primary hypothyroidism : -Patient has been taking Armour Thyroid which she prefers 45 mg daily.  # 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 been usually mild, longstanding intermittent, not on any diuretics or SSRIs. Urine osmolality as of 04/2015 was 618. She was told to cut back on her fluid intake but she is not doing so and she says she is trying to drink more water since she thought this was healthy. Sodium is variable but generally between 129-135. Has not had any symptoms from hyponatremia   Interval history  Patient had cortisone injection into the heel for bony spur about 6 weeks ago.   Hemoglobin A1c recently 7.6%.  Recent laboratory results reviewed.  She has elevated LDL and total cholesterol.  Serum sodium normal.  TSH was normal 2.69.  Pump and CGM data as reviewed above.  She has no complaints today.  REVIEW OF SYSTEMS As per history of present illness.   PAST MEDICAL HISTORY: Past Medical History:  Diagnosis Date   Anxiety    new dx   Atrial fibrillation (HCC)    a. 03/2015 s/p RFCA;  b. CHA2DS2VASc = 5-->Eliquis.   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 12/2014 - due to AF RVR;  b. 03/2015 Echo: EF 60-65%, no rwma.   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: 1/1 Node Positive for Mets.: ER/PR POs., Her 2 Neu negative, Ki-67 12%   COLONOSCOPY     1 polyp    ELECTROPHYSIOLOGIC STUDY N/A 04/08/2015   PVI + CTI ablation + posterior Box lesion + Atach ablation by Dr Gina [name]   OVARIAN CYST SURGERY     TEE WITHOUT CARDIOVERSION N/A 04/07/2015   Procedure: TRANSESOPHAGEAL ECHOCARDIOGRAM (TEE);  Surgeon: Gina Mixer, MD;  Location: [facility] ENDOSCOPY;  Service: Cardiovascular;  Laterality: N/A;   TONSILLECTOMY      ALLERGIES: Allergies  Allergen Reactions   Rosuvastatin Calcium     Headaches, fatigue    FAMILY HISTORY:  Family History  Problem Relation Age of Onset   Breast cancer  Mother 21   Heart failure Mother    Prostate cancer Maternal Uncle        died in his 31s   Heart attack Father    Diabetes Maternal Uncle 15    SOCIAL HISTORY: Social History   Socioeconomic History   Marital status: Married    Spouse name: Not on file   Number of children: Not on file   Years of education: Not on file   Highest education level: Not on file  Occupational History   Not on file  Tobacco Use   Smoking status:  Never   Smokeless tobacco: Never  Vaping Use   Vaping status: Never Used  Substance and Sexual Activity   Alcohol use: No   Drug use: No   Sexual activity: Yes  Other Topics Concern   Not on file  Social History Narrative   Not on file   Social Determinants of Health   Financial Resource Strain: Not on file  Food Insecurity: Not on file  Transportation Needs: Not on file  Physical Activity: Not on file  Stress: Not on file  Social Connections: Not on file    MEDICATIONS:  Current Outpatient Medications  Medication Sig Dispense Refill   apixaban (ELIQUIS) 5 MG TABS tablet Take 1 tablet (5 mg total) by mouth 2 (two) times daily. 180 tablet 2   Ascorbic Acid (VITAMIN C) 1000 MG tablet Take 1,000 mg by mouth daily.      ASHWAGANDHA PO Take 380 mg by mouth daily.     aspirin EC 81 MG tablet Take 81 mg by mouth daily. Swallow whole.     Cholecalciferol (VITAMIN D PO) Take 1 drop by mouth daily. vitamin d3 plus k2     Coenzyme Q10 (COQ10) 200 MG CAPS Take 200 mg by mouth daily.     Continuous Blood Gluc Sensor (DEXCOM G6 SENSOR) MISC by Does not apply route.     Continuous Blood Gluc Transmit (DEXCOM G6 TRANSMITTER) MISC by Does not apply route.     Cyanocobalamin (VITAMIN B-12) 5000 MCG SUBL Place 5,000 mcg under the tongue daily.     diltiazem (CARDIZEM) 30 MG tablet Take 30 mg by mouth daily.     ezetimibe (ZETIA) 10 MG tablet TAKE 1 TABLET BY MOUTH EVERY DAY 90 tablet 3   glucose blood (ONE TOUCH ULTRA TEST) test strip Use asto check blood  sugar 5 times a day 200 each 5   insulin aspart (NOVOLOG) 100 UNIT/ML injection USE MAX OF 60 UNITS DAILY VIA INSULIN PUMP. 50 mL 3   losartan (COZAAR) 25 MG tablet TAKE 1 TABLET BY MOUTH EVERY DAY 90 tablet 3   Misc Natural Products (GLUCOSAMINE CHOND MSM FORMULA PO) Take 15 mLs by mouth daily.     Multiple Vitamin (MULTIVITAMIN ADULT PO) Take 1 tablet by mouth daily. Memo Max Pro-     Multiple Vitamin (MULTIVITAMIN ADULT PO) Take 100 mg by mouth every morning. - Estrogen supplement     Multiple Vitamin (MULTIVITAMIN ADULT PO) Take 1 capsule by mouth 2 (two) times daily. Neurovision- For eyes and immune Health     Multiple Vitamin (MULTIVITAMIN PO) Take 12.5 mg by mouth daily. Iodoral-     Multiple Vitamin (MULTIVITAMIN PO) Take 50 mcg by mouth daily. Cholestar- Vitamin K27-     Multiple Vitamin (MULTIVITAMIN) capsule Glycemic Balance-Taking 2 capsules by mouth daily-     NON FORMULARY Take 15 mLs by mouth daily. MAG CAL VANILLA LIQUID     NON FORMULARY Take 2 capsules by mouth every morning. Keraflex-     Omega-3 Fatty Acids (SUPER OMEGA-3 PO) Take 2 capsules by mouth daily.     Red Yeast Rice Extract (RED YEAST RICE PO) Take 1,200 mg by mouth daily in the afternoon.     RESVERATROL PO Take 960 mg by mouth daily.     thyroid (ARMOUR THYROID) 15 MG tablet TAKE 1 TABLET DAILY WITH THE 30 MG TABLET (TOTAL OF 45 MG DAILY) 90 tablet 3   thyroid (ARMOUR THYROID) 30 MG tablet TAKE ONE TABLET DAILY ALONG WITH  THE 15 MG TABLET. (TOTAL DOSAGE OF 45 MG DAILY) 90 tablet 1   Turmeric Curcumin 500 MG CAPS Take 482 mg by mouth daily.     No current facility-administered medications for this visit.   Facility-Administered Medications Ordered in Other Visits  Medication Dose Route Frequency Provider Last Rate Last Admin   topical emolient (BIAFINE) emulsion   Topical Daily Lonie Peak, MD   Given at 05/28/12 1400    PHYSICAL EXAM: Vitals:   09/24/23 0909  BP: 112/62  Pulse: 72  Resp: 20  SpO2: 98%   Weight: 155 lb 3.2 oz (70.4 kg)  Height: 5\' 4"  (1.626 m)   Body mass index is 26.64 kg/m.  Wt Readings from Last 3 Encounters:  09/24/23 155 lb 3.2 oz (70.4 kg)  05/23/23 152 lb 3.2 oz (69 kg)  01/22/23 154 lb (69.9 kg)    General: Well developed, well nourished female in no apparent distress.  HEENT: AT/[place], no external lesions.  Eyes: Conjunctiva clear and no icterus. Neck: Neck supple  Lungs: Respirations not labored Neurologic: Alert, oriented, normal speech Extremities / Skin: Dry. No sores or rashes noted.  Psychiatric: Does not appear depressed or anxious  Diabetic Foot Exam - Simple   Simple Foot Form Diabetic Foot exam was performed with the following findings: Yes 09/24/2023  9:44 AM  Visual Inspection See comments: Yes Sensation Testing Intact to touch and monofilament testing bilaterally: Yes Pulse Check See comments: Yes Comments DP 2+ bilaterally. B/l bunion, no other deformity.     LABS Reviewed Lab Results  Component Value Date   HGBA1C 7.6 (H) 09/19/2023   HGBA1C 7.3 (H) 05/21/2023   HGBA1C 7.1 (H) 01/17/2023   No results found for: "FRUCTOSAMINE" Lab Results  Component Value Date   CHOL 238 (H) 09/19/2023   HDL 78 09/19/2023   LDLCALC 144 (H) 09/19/2023   LDLDIRECT 90.0 05/28/2022   TRIG 61 09/19/2023   CHOLHDL 3.1 09/19/2023   Lab Results  Component Value Date   MICRALBCREAT 5 09/19/2023   MICRALBCREAT 2.0 09/18/2022   Lab Results  Component Value Date   CREATININE 0.72 09/19/2023   Lab Results  Component Value Date   GFR 62.73 05/21/2023    ASSESSMENT / PLAN  1. Uncontrolled type 1 diabetes mellitus with hyperglycemia (HCC)   2. Adult onset hypothyroidism   3. Pure hypercholesterolemia   4. Hyponatremia   5. Mixed hyperlipidemia     Diabetes Mellitus type 1, complicated by no known complications.  - Diabetic status / severity: uncontrolled.   Lab Results  Component Value Date   HGBA1C 7.6 (H) 09/19/2023    -  Hemoglobin A1c goal <7% worsening with postprandial hyperglycemia.  Discussed about portion control and limiting carbohydrate intake.  Patient has relatively better blood sugar in the last visit reviewed download from last visit in July and August as well.  - Medications:  Insulin pump setting no change in setting today.  - Home glucose testing: continue CGM and check blood glucose as needed.  - Discussed/ Gave Hypoglycemia treatment plan.  # Consult : not required at this time.   # Annual urine for microalbuminuria/ creatinine ratio, no microalbuminuria currently. Last  Lab Results  Component Value Date   MICRALBCREAT 5 09/19/2023    # Foot check nightly.  # Annual dilated diabetic eye exams.   - Diet: Make healthy diabetic food choices - Life style / activity / exercise: discussed.   2. Blood pressure  -  BP Readings from  Last 1 Encounters:  09/24/23 112/62    - Control is in target.  - No change in current plans.  3. Lipid status / Hyperlipidemia - Last  Lab Results  Component Value Date   LDLCALC 144 (H) 09/19/2023   - Continue Zetia 10 mg daily.  Patient declined statin therapy.  # Primary hypothyroidism -Continue Armour Thyroid 45 mg daily.  # Hyponatremia ?  SIADH -This is mild and asymptomatic.  Patient serum sodium 135.  Will check serum sodium periodically.  Advised for limitation of fluid/water intake.  Patient was suggested for establishing regular care with primary care provider by Dr. Lucianne Muss in last visit.  Diagnoses and all orders for this visit:  Uncontrolled type 1 diabetes mellitus with hyperglycemia (HCC) -     Basic metabolic panel -     Hemoglobin A1c  Adult onset hypothyroidism  Pure hypercholesterolemia  Hyponatremia  Mixed hyperlipidemia    DISPOSITION Follow up in clinic in 3  months suggested.   All questions answered and patient verbalized understanding of the plan.  Gina Dava Rensch, MD [facility] Endocrinology [facility] [address] [place], [address] [place], [place] [address] Phone # [phone]  At least part of this note was generated using voice recognition software. Inadvertent word errors may have occurred, which were not recognized during the proofreading process.

## 2023-09-25 ENCOUNTER — Ambulatory Visit (HOSPITAL_COMMUNITY)
Admission: RE | Admit: 2023-09-25 | Discharge: 2023-09-25 | Disposition: A | Discharge status: Home / Self Care | Payer: PPO | Source: Ambulatory Visit | Attending: Internal Medicine | Admitting: Internal Medicine

## 2023-09-25 ENCOUNTER — Encounter: Payer: Self-pay | Admitting: Endocrinology

## 2023-09-25 VITALS — BP 160/66 | HR 76 | Ht 64.0 in | Wt 153.8 lb

## 2023-09-25 DIAGNOSIS — I110 Hypertensive heart disease with heart failure: Secondary | ICD-10-CM | POA: Insufficient documentation

## 2023-09-25 DIAGNOSIS — Z794 Long term (current) use of insulin: Secondary | ICD-10-CM | POA: Diagnosis not present

## 2023-09-25 DIAGNOSIS — Z7901 Long term (current) use of anticoagulants: Secondary | ICD-10-CM | POA: Insufficient documentation

## 2023-09-25 DIAGNOSIS — D6869 Other thrombophilia: Secondary | ICD-10-CM | POA: Insufficient documentation

## 2023-09-25 DIAGNOSIS — I4891 Unspecified atrial fibrillation: Secondary | ICD-10-CM

## 2023-09-25 DIAGNOSIS — E109 Type 1 diabetes mellitus without complications: Secondary | ICD-10-CM | POA: Insufficient documentation

## 2023-09-25 DIAGNOSIS — I480 Paroxysmal atrial fibrillation: Secondary | ICD-10-CM | POA: Insufficient documentation

## 2023-09-25 DIAGNOSIS — Z9641 Presence of insulin pump (external) (internal): Secondary | ICD-10-CM | POA: Insufficient documentation

## 2023-09-25 DIAGNOSIS — I5032 Chronic diastolic (congestive) heart failure: Secondary | ICD-10-CM | POA: Insufficient documentation

## 2023-09-25 LAB — CBC
HCT: 37.5 % (ref 36.0–46.0)
Hemoglobin: 12.5 g/dL (ref 12.0–15.0)
MCH: 30 pg (ref 26.0–34.0)
MCHC: 33.3 g/dL (ref 30.0–36.0)
MCV: 89.9 fL (ref 80.0–100.0)
Platelets: 217 10*3/uL (ref 150–400)
RBC: 4.17 MIL/uL (ref 3.87–5.11)
RDW: 13.7 % (ref 11.5–15.5)
WBC: 5 10*3/uL (ref 4.0–10.5)
nRBC: 0 % (ref 0.0–0.2)

## 2023-09-25 NOTE — Progress Notes (Addendum)
Primary Care Physician: Patient, No Pcp Per Referring Physician: Dr. Eligah East Gina is a 77 y.o. female with a h/o paroxysmal  afib s/p ablation 04/08/15. She had done very well with no reoccurrence of  sustained afib.She is off antiarrythmic's and rate control for some time. She continue on eliquis without bleeding problems.   F/u in afib clinic, 09/20/21. EKG shows today NSR. She reports no awareness of afib and that she feels well. She does report that she had a syncopal spell while cooking this past Thanksgiving dinner. She was noted to have a BP of 88 systolic at the time. She may have been dehydrated and with prolonging standing, her BP dropped. She was able to complete cooking the meal after her husband helped her get up. BP returned to normal limits within a few minutes.  No heart rate issues. No neuro signs.  Had one other syncopal spell in 2019 but cannot remember specifics.   F/u in afib clinic 02/01/22. She is here today as  she felt she may be back in afib. EKG today shows SR. She states that the morning of 4/12, shortly after she got up, she vomited and flet weak. She then started checking her V/S and her BP was low  and heart rate was irregular but not elevated. She improved during the day but felt  weak, she has not had any other spells.   F/u in afib clinic, 09/20/22, since I saw her 4/20, she was seen int the ER for a sudden loss of vision and left central retinal artery occlusion. She wore an event monitor for 2 weeks and there was no afib. She has seen Neurology and Dr. Katrinka Blazing for f/u. She was compliant with Eliquis. Baby ASA was added and she has regained most of her vision from the stroke. No afib that she has noted. She had very infrequent near syncope or brief syncope spells that she has had for years and frequently occur with standing, resolve with sitting. V/S and blood sugars never show any irregularity. She has mentioned to to  her other providers as well. She is not  driving now.   F/u in Afib clinic, 09/25/23. She is currently in NSR. She has had no episodes of Afib since last office visit. She wears an Apple watch. She has no bleeding issues on Eliquis 5 mg BID.   Today, she denies symptoms of palpitations, chest pain, shortness of breath, orthopnea, PND, lower extremity edema, dizziness, presyncope, syncope, or neurologic sequela. The patient is tolerating medications without difficulties and is otherwise without complaint today.   Past Medical History:  Diagnosis Date   Anxiety    new dx   Atrial fibrillation (HCC)    a. 03/2015 s/p RFCA;  b. CHA2DS2VASc = 5-->Eliquis.   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 12/2014 - due to AF RVR;  b. 03/2015 Echo: EF 60-65%, no rwma.   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: 1/1 Node Positive for Mets.: ER/PR POs., Her 2 Neu negative, Ki-67 12%   COLONOSCOPY     1 polyp    ELECTROPHYSIOLOGIC STUDY N/A 04/08/2015   PVI + CTI ablation + posterior Box lesion + Atach ablation by Dr Johney Frame   OVARIAN CYST SURGERY     TEE WITHOUT CARDIOVERSION N/A 04/07/2015   Procedure: TRANSESOPHAGEAL ECHOCARDIOGRAM (TEE);  Surgeon: Gina Mixer, Gina [name];  Location: [facility] ENDOSCOPY;  Service: Cardiovascular;  Laterality: N/A;   TONSILLECTOMY      Current Outpatient Medications   Medication Sig Dispense Refill   apixaban (ELIQUIS) 5 MG TABS tablet Take 1 tablet (5 mg total) by mouth 2 (two) times daily. 180 tablet 2   Ascorbic Acid (VITAMIN C) 1000 MG tablet Take 1,000 mg by mouth daily.      ASHWAGANDHA PO Take 380 mg by mouth daily.     aspirin EC 81 MG tablet Take 81 mg by mouth daily. Swallow whole.     Cholecalciferol (VITAMIN D PO) Take 1 drop by mouth daily. vitamin d3 plus k2     Coenzyme Q10 (COQ10) 200 MG CAPS Take 200 mg by mouth daily.     Continuous Blood Gluc Sensor (DEXCOM G6 SENSOR) MISC by Does not apply route.     Continuous Blood Gluc Transmit (DEXCOM G6 TRANSMITTER) MISC by Does not apply route.     Cyanocobalamin (VITAMIN B-12) 5000 MCG SUBL Place 5,000 mcg under the tongue daily.     diltiazem (CARDIZEM) 30 MG tablet Take 30 mg by mouth daily.     ezetimibe (ZETIA) 10 MG tablet TAKE 1 TABLET BY MOUTH EVERY DAY 90 tablet 3   glucose blood (ONE TOUCH ULTRA TEST) test strip Use asto check blood sugar 5 times a day 200 each 5   insulin aspart (NOVOLOG) 100 UNIT/ML injection USE MAX OF 60 UNITS DAILY VIA INSULIN PUMP. 50 mL 3   losartan (COZAAR) 25 MG tablet TAKE 1 TABLET BY MOUTH EVERY DAY 90 tablet 3   Misc Natural Products (GLUCOSAMINE CHOND MSM FORMULA PO) Take 15 mLs by mouth daily.     Multiple Vitamin (MULTIVITAMIN ADULT PO) Take 1 tablet by mouth daily. Memo Max Pro-     Multiple Vitamin (MULTIVITAMIN ADULT PO) Take 100 mg by mouth every morning. - Estrogen supplement     Multiple Vitamin (MULTIVITAMIN ADULT PO) Take 1 capsule by mouth 2 (two) times daily. Neurovision- For eyes and immune Health     Multiple Vitamin (MULTIVITAMIN PO) Take 12.5 mg by mouth daily. Iodoral-     Multiple Vitamin (MULTIVITAMIN PO) Take 50 mcg by mouth daily. Cholestar- Vitamin K27-     Multiple Vitamin (MULTIVITAMIN) capsule Glycemic Balance-Taking 2 capsules by mouth daily-     NON FORMULARY Take 15 mLs by mouth daily. MAG CAL VANILLA LIQUID     NON FORMULARY  Take 2 capsules by mouth every morning. Keraflex-     Omega-3 Fatty Acids (SUPER OMEGA-3 PO) Take 2 capsules by mouth daily.     Red Yeast Rice Extract (RED YEAST RICE PO) Take 1,200 mg by mouth daily in the afternoon.     RESVERATROL PO Take 960 mg by mouth daily.     thyroid (ARMOUR THYROID) 15 MG tablet TAKE 1 TABLET DAILY WITH THE  30 MG TABLET (TOTAL OF 45 MG DAILY) 90 tablet 3   thyroid (ARMOUR THYROID) 30 MG tablet TAKE ONE TABLET DAILY ALONG WITH THE 15 MG TABLET. (TOTAL DOSAGE OF 45 MG DAILY) 90 tablet 1   Turmeric Curcumin 500 MG CAPS Take 482 mg by mouth daily.     No current facility-administered medications for this visit.   Facility-Administered Medications Ordered in Other Visits  Medication Dose Route Frequency Provider Last Rate Last Admin   topical emolient (BIAFINE) emulsion   Topical Daily Lonie Peak, Gina [name]   Given at 05/28/12 1400    Allergies  Allergen Reactions   Rosuvastatin Calcium     Headaches, fatigue   ROS- All systems are reviewed and negative except as per the HPI above  Physical Exam: There were no vitals filed for this visit.  Wt Readings from Last 3 Encounters:  09/24/23 70.4 kg  05/23/23 69 kg  01/22/23 69.9 kg    Labs: Lab Results  Component Value Date   NA 135 09/19/2023   K 4.6 09/19/2023   CL 97 (L) 09/19/2023   CO2 30 09/19/2023   GLUCOSE 141 (H) 09/19/2023   BUN 12 09/19/2023   CREATININE 0.72 09/19/2023   CALCIUM 9.1 09/19/2023   MG 1.7 01/13/2015   Lab Results  Component Value Date   INR 1.1 02/10/2022   Lab Results  Component Value Date   CHOL 238 (H) 09/19/2023   HDL 78 09/19/2023   LDLCALC 144 (H) 09/19/2023   TRIG 61 09/19/2023   GEN- The patient is well appearing, alert and oriented x 3 today.   Neck - no JVD or carotid bruit noted Lungs- Clear to ausculation bilaterally, normal work of breathing Heart- Regular rate and rhythm, no murmurs, rubs or gallops, PMI not laterally displaced Extremities- no clubbing,  cyanosis, or edema Skin - no rash or ecchymosis noted   EKG- Vent. rate 76 BPM PR interval 164 ms QRS duration 76 ms QT/QTcB 370/416 ms P-R-T axes 73 80 57 Normal sinus rhythm Septal infarct , age undetermined Abnormal ECG When compared with ECG of 20-Sep-2022 10:02, PREVIOUS ECG IS PRESENT  ECHO 02/11/22:  1. Left ventricular ejection fraction, by estimation, is 60 to 65%. The  left ventricle has normal function. The left ventricle has no regional  wall motion abnormalities. There is mild concentric left ventricular  hypertrophy. Left ventricular diastolic  parameters are indeterminate.   2. Right ventricular systolic function is normal. The right ventricular  size is normal. There is normal pulmonary artery systolic pressure. The  estimated right ventricular systolic pressure is 19.0 mmHg.   3. The mitral valve is degenerative. Trivial mitral valve regurgitation.  No evidence of mitral stenosis. Moderate mitral annular calcification.   4. The aortic valve is tricuspid. Aortic valve regurgitation is not  visualized. Aortic valve sclerosis/calcification is present, without any  evidence of aortic stenosis. Aortic valve area, by VTI measures 2.45 cm.  Aortic valve mean gradient measures  2.0 mmHg. Aortic valve Vmax measures 1.05 m/s.   5. The inferior vena cava is normal in size with greater than 50%  respiratory variability, suggesting right atrial pressure of 3 mmHg.    Assessment and Plan: 1. Afib She is currently in NSR. Discussed how to set up ECG capability on Apple watch and showed patient how to do it.  Overall appears to have low burden. Will continue conservative observation.   2. CHA2DS2VASc score of at least 4 Eliquis appropriately dosed at 5 mg bid.  CBC done today.  3. HTN Elevated today but at home her BP is less than 130/80.    4. DM Per Dr. Lucianne Muss    F/u in Afib clinic in 1 year.   Justin Mend, PA-C Afib Clinic [facility] [address] [place], [place] [address] [phone]

## 2023-10-02 ENCOUNTER — Other Ambulatory Visit: Payer: Self-pay

## 2023-10-02 DIAGNOSIS — E7800 Pure hypercholesterolemia, unspecified: Secondary | ICD-10-CM

## 2023-10-02 MED ORDER — THYROID 15 MG PO TABS: ORAL_TABLET | ORAL | 3 refills | Status: DC

## 2023-10-02 MED ORDER — THYROID 30 MG PO TABS: ORAL_TABLET | ORAL | 1 refills | Status: DC

## 2023-10-07 DIAGNOSIS — E1065 Type 1 diabetes mellitus with hyperglycemia: Secondary | ICD-10-CM | POA: Diagnosis not present

## 2023-10-28 ENCOUNTER — Telehealth: Payer: Self-pay | Admitting: Nutrition

## 2023-10-28 ENCOUNTER — Other Ambulatory Visit: Payer: Self-pay

## 2023-10-28 MED ORDER — EZETIMIBE 10 MG PO TABS: 10.0000 mg | ORAL_TABLET | Freq: Every day | ORAL | 3 refills | Status: DC

## 2023-10-28 NOTE — Telephone Encounter (Signed)
 Per last office visit patient is to continue on Zetia and medication has been sent to pharmacy and patient is aware

## 2023-10-28 NOTE — Telephone Encounter (Signed)
 LVM on my machine that no one has returned the pharmacy call to reorder her Exetimibe.  She reports that she has been out of this medication and needs this called to her pharmacy, or a call her to confirm that she no longer needs this medication.

## 2023-10-31 ENCOUNTER — Ambulatory Visit: Payer: Self-pay | Admitting: General Practice

## 2023-10-31 NOTE — Telephone Encounter (Signed)
Copied from CRM [id number]. Topic: Appointments - Appointment Scheduling >> Oct 31, 2023 10:36 AM Gina [name] wrote: Patient/patient representative is calling to schedule an appointment. Patient is calling in because she is itching all over  Refer to attachments for appointment information.   Chief Complaint: itching all over Symptoms: itching "all over" especially arms and thighs, intermittent rash when scratching Frequency: intermittent, continual Pertinent Negatives: Patient denies fever,  Disposition: [] 911 / [] ED /[x] Urgent Care (no appt availability in office) / [] Appointment(In office/virtual)/ []  [facility]/ [] Home Care/ [] Refused Recommended Disposition /[]  Mobile Bus/ []  Follow-up with PCP Additional Notes: Pt is poor historian. Pt reporting that itching/rash "started some time around Christmas, continually but itching on and off because found some medication that been prescribed for this same thing in the past." Pt confirms that her scratching brings on the rash, "when it shows as a quote, rash, it's a red spot" and "assume because been scratching it, most areas nothing shows, it just itches." Pt reporting that the spots are "very small," biggest one is maybe "1/4 inch and probably less than that," and "red because scratched it." Pt confirms no fever, no new detergents or meds before itching/rash started but recently changed detergents few days ago to see if would help issue and no improvement. Pt confirms no other symptoms like sore throat, joint pain, pain anywhere, SOB, or trouble swallowing. Pt reporting no purple or blood colored spots, then states that "on thighs, looks purpley-red" as "spots." Pt reporting "place on arm that itches the worst" and rash showing up and for how long only "depends on how much I scratch it." Nurse asked if pt experienced this before since was prescribed for it in past, pt did not answer question. Advised pt be seen in next 24 hours, no  availability for new pt acute appt today, advised UC today and scheduled new pt appt with Dimas Aguas NP for 2/26. Pt verbalized understanding and will head to UC shortly.  Reason for Disposition  SEVERE itching (i.e., interferes with sleep, normal activities or school)  Answer Assessment - Initial Assessment Questions 1. APPEARANCE of RASH: "Describe the rash." (e.g., spots, blisters, raised areas, skin peeling, scaly)     When it shows as a quote rash it's a red spot assume because been scratching it, most areas nothing shows it just itches 2. SIZE: "How big are the spots?" (e.g., tip of pen, eraser, coin; inches, centimeters)     Very small, biggest one is 1/4 inch and probably less than that 3. LOCATION: "Where is the rash located?"     Arms and thighs 4. COLOR: "What color is the rash?" (Note: It is difficult to assess rash color in people with darker-colored skin. When this situation occurs, simply ask the caller to describe what they see.)     Red because scratched it 5. ONSET: "When did the rash begin?"     Started some time around Christmas, continually but itching on and off because found some medication that been prescribed for this same thing in the past and hydroxizine Hcl pills to help her sleep, maybe "some" help with itching, found another cream 6. FEVER: "Do you have a fever?" If Yes, ask: "What is your temperature, how was it measured, and when did it start?"     denies 7. ITCHING: "Does the rash itch?" If Yes, ask: "How bad is the itch?" (Scale 1-10; or mild, moderate, severe)     At least a 7, possibly 8 or  9 8. CAUSE: "What do you think is causing the rash?"     Just switched detergents to see if would help with itching but confirms no change in detergents before itching/rash started 9. MEDICINE FACTORS: "Have you started any new medicines within the last 2 weeks?" (e.g., antibiotics)      No new meds 10. OTHER SYMPTOMS: "Do you have any other symptoms?" (e.g., dizziness,  headache, sore throat, joint pain)       denies  Protocols used: Rash or Redness - [facility]

## 2023-11-08 ENCOUNTER — Encounter (HOSPITAL_COMMUNITY): Payer: Self-pay | Admitting: Emergency Medicine

## 2023-11-08 ENCOUNTER — Ambulatory Visit (HOSPITAL_COMMUNITY)
Admission: EM | Admit: 2023-11-08 | Discharge: 2023-11-08 | Disposition: A | Discharge status: Home / Self Care | Payer: PPO

## 2023-11-08 DIAGNOSIS — R21 Rash and other nonspecific skin eruption: Secondary | ICD-10-CM | POA: Diagnosis not present

## 2023-11-08 MED ORDER — DEXAMETHASONE SODIUM PHOSPHATE 10 MG/ML IJ SOLN
INTRAMUSCULAR | Status: AC
Start: 2023-11-08 — End: ?
  Filled 2023-11-08: qty 1

## 2023-11-08 MED ORDER — DEXAMETHASONE SODIUM PHOSPHATE 10 MG/ML IJ SOLN
10.0000 mg | Freq: Once | INTRAMUSCULAR | Status: AC
  Administered 2023-11-08: 10 mg via INTRAMUSCULAR

## 2023-11-08 MED ORDER — CETIRIZINE HCL 10 MG PO TABS
10.0000 mg | ORAL_TABLET | Freq: Every day | ORAL | 0 refills | Status: DC
Start: 2023-11-08 — End: 2024-07-08

## 2023-11-08 NOTE — ED Triage Notes (Signed)
Patient states she has a rash on bilateral arms and thighs since x 1 month.

## 2023-11-08 NOTE — ED Provider Notes (Signed)
[facility]    CSN: [id number] Arrival date & time: 11/08/23  0909      History   Chief Complaint Chief Complaint  Patient presents with   Rash    HPI Gina [name] is a 78 y.o. female.   Patient presents to clinic for an itchy red rash that has been present for the past month.  Noticed this around Christmas time to her bilateral arms and anterior thighs.  Since the rash started she has been using calamine lotion and triamcinolone ointment daily.  She has since switched to an unscented detergent and not seen any improvement.  Rash has not spread or worsened since starting.  She is a type I diabetic and reports good glucose control.  Will take hydroxyzine at night to help with itching and sleep.  Presents to clinic today because she is tired of itching.  The history is provided by the patient and medical records.  Rash   Past Medical History:  Diagnosis Date   Anxiety    new dx   Atrial fibrillation (HCC)    a. 03/2015 s/p RFCA;  b. CHA2DS2VASc = 5-->Eliquis.   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 12/2014 - due to AF RVR;  b. 03/2015 Echo: EF 60-65%, no rwma.   Hyperlipidemia    Hypertension    Hypothyroidism    Insulin pump in place    Ovarian cyst    S/P radiation therapy 04/10/12- 05/26/2012   left Breast and Axilla / 50 gy / 25 Fractions with Left Breast Boost / 10 Gy / 5 Fractions   SIADH (syndrome of inappropriate ADH production) (HCC)    Use of letrozole (Femara) 06/13/12    Patient Active Problem List   Diagnosis Date Noted   Hypercoagulable state due to paroxysmal atrial fibrillation (HCC) 09/25/2023   Mixed hyperlipidemia 01/24/2023   CRAO (central retinal artery occlusion), left 02/11/2022   Hyponatremia 02/11/2022   A-fib (HCC) 04/08/2015   Chest pain     Hypothyroidism 01/12/2015   Chronic anticoagulation-Eliquis 01/12/2015   Typical atrial flutter (HCC)    Pain in the chest    Paroxysmal atrial fibrillation (HCC)    Pyrexia    Atrial fibrillation with RVR (HCC) 01/11/2015   Elevated troponin 01/09/2015   Sepsis due to urinary tract infection (HCC)    Diabetes type 1, controlled (HCC)    Other specified hypotension    Bradycardia    Hypotension 01/08/2015   Sepsis (HCC) 01/08/2015   Hypothermia 01/08/2015   Diarrhea 01/08/2015   Syncope and collapse 01/08/2015   Dehydration 01/08/2015   Tingling 01/08/2015   Anemia 01/08/2015   Thrombocytopenia (HCC) 01/08/2015   Dyslipidemia 08/07/2013   Essential hypertension 06/25/2013   Anxiety    Breast cancer-hx of radiation Rx 03/05/2012    Past Surgical History:  Procedure Laterality Date   APPENDECTOMY     BREAST LUMPECTOMY Left 03/05/2012   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: 1/1 Node Positive for Mets.: ER/PR POs., Her 2 Neu negative, Ki-67 12%   COLONOSCOPY     1 polyp    ELECTROPHYSIOLOGIC STUDY N/A 04/08/2015   PVI + CTI ablation + posterior Box lesion + Atach ablation by Dr Johney Frame   OVARIAN CYST SURGERY     TEE WITHOUT  CARDIOVERSION N/A 04/07/2015   Procedure: TRANSESOPHAGEAL ECHOCARDIOGRAM (TEE);  Surgeon: Vesta Mixer, MD;  Location: [facility] END[facility]COPY;  Service: Cardiovascular;  Laterality: N/A;   TONSILLECTOMY      OB History     Gravida  3   Para  3   Term      Preterm      AB      Living         SAB      IAB      Ectopic      Multiple      Live Births           Obstetric Comments  Menarche age 78, Parity age 40, Last Menses age 68, Oral contraceptives less than a month. applie Progesterone cream to chest for over 15 years.          Home Medications    Prior to Admission medications   Medication Sig Start Date End Date Taking? Authorizing Provider  apixaban (ELIQUIS) 5 MG TABS tablet Take 1 tablet  (5 mg total) by mouth 2 (two) times daily. 07/01/23  Yes Eustace Pen, PA-C  Ascorbic Acid (VITAMIN C) 1000 MG tablet Take 1,000 mg by mouth daily.    Yes [provider]  ASHWAGANDHA PO Take 380 mg by mouth daily.   Yes [provider]  aspirin EC 81 MG tablet Take 81 mg by mouth daily. Swallow whole.   Yes [provider]  cetirizine (ZYRTEC ALLERGY) 10 MG tablet Take 1 tablet (10 mg total) by mouth daily. 11/08/23  Yes Rinaldo Ratel, [place] N, FNP  Cholecalciferol (VITAMIN D PO) Take 1 drop by mouth daily. vitamin d3 plus k2   Yes [provider]  Coenzyme Q10 (COQ10) 200 MG CAPS Take 200 mg by mouth daily.   Yes [provider]  Cyanocobalamin (VITAMIN B-12) 5000 MCG SUBL Place 5,000 mcg under the tongue daily.   Yes [provider]  diltiazem (CARDIZEM) 30 MG tablet Take 30 mg by mouth daily.   Yes [provider]  Ferrous Sulfate (IRON PO) Take by mouth.   Yes [provider]  insulin aspart (NOVOLOG) 100 UNIT/ML injection USE MAX OF 60 UNITS DAILY VIA INSULIN PUMP. 12/11/22  Yes Reather Littler, MD  insulin aspart (NOVOLOG) 100 UNIT/ML injection Inject into the skin. 12/11/22  Yes [provider]  losartan (COZAAR) 25 MG tablet TAKE 1 TABLET BY MOUTH EVERY DAY 10/04/22  Yes Newman Nip, NP  losartan (COZAAR) 25 MG tablet Take 1 tablet by mouth daily. 10/04/22  Yes [provider]  Misc Natural Products (GLUCOSAMINE CHOND MSM FORMULA PO) Take 15 mLs by mouth daily.   Yes [provider]  Multiple Vitamin (MULTIVITAMIN ADULT PO) Take 100 mg by mouth every morning. - Estrogen supplement   Yes [provider]  thyroid (ARMOUR THYROID) 15 MG tablet TAKE 1 TABLET DAILY WITH THE 30 MG TABLET (TOTAL OF 45 MG DAILY) 10/02/23  Yes Thapa, [place], MD  thyroid (ARMOUR THYROID) 30 MG tablet TAKE ONE TABLET DAILY ALONG WITH THE 15 MG TABLET. (TOTAL DOSAGE OF 45 MG DAILY) 10/02/23  Yes Thapa, [place], MD   Turmeric Curcumin 500 MG CAPS Take 482 mg by mouth daily.   Yes [provider]  Continuous Blood Gluc Sensor (DEXCOM G6 SENSOR) MISC by Does not apply route.    [provider]  Continuous Blood Gluc Transmit (DEXCOM G6 TRANSMITTER) MISC by Does not apply route.    [provider]  glucose blood (ONE TOUCH ULTRA TEST) test strip Use asto check blood sugar 5 times a day 11/07/16   Reather Littler, MD  Multiple Vitamin (MULTIVITAMIN ADULT PO) Take 1 capsule by mouth 2 (two) times daily. Neurovision- For eyes and immune Health    [provider]  Multiple Vitamin (MULTIVITAMIN PO) Take 12.5 mg by mouth daily. Iodoral-    [provider]  Multiple Vitamin (MULTIVITAMIN PO) Take 50 mcg by mouth daily. Cholestar- Vitamin K27-    [provider]  Multiple Vitamin (MULTIVITAMIN) capsule Glycemic Balance-Taking 2 capsules by mouth daily-    [provider]  NON FORMULARY Take 15 mLs by mouth daily. MAG CAL VANILLA LIQUID    [provider]  NON FORMULARY Take 2 capsules by mouth every morning. Keraflex-    [provider]  Omega-3 Fatty Acids (SUPER OMEGA-3 PO) Take 2 capsules by mouth daily.    [provider]  RESVERATROL PO Take 960 mg by mouth daily.    [provider]    Family History Family History  Problem Relation Age of Onset   Breast cancer Mother 76   Heart failure Mother    Prostate cancer Maternal Uncle        died in his 97s   Heart attack Father    Diabetes Maternal Uncle 64    Social History Social History   Tobacco Use   Smoking status: Never   Smokeless tobacco: Never  Vaping Use   Vaping status: Never Used  Substance Use Topics   Alcohol use: No   Drug use: No     Allergies   Rosuvastatin calcium and Rosuvastatin calcium   Review of Systems Review of Systems  Per HPI   Physical Exam Triage Vital Signs ED Triage Vitals  Encounter Vitals Group     BP 11/08/23  0952 (!) 161/79     Systolic BP Percentile --      Diastolic BP Percentile --      Pulse Rate 11/08/23 0952 66     Resp 11/08/23 0952 18     Temp 11/08/23 0952 98 F (36.7 C)     Temp Source 11/08/23 0952 Oral     SpO2 11/08/23 0952 97 %     Weight --      Height --      Head Circumference --      Peak Flow --      Pain Score 11/08/23 0948 0     Pain Loc --      Pain Education --      Exclude from Growth Chart --    No data found.  Updated Vital Signs BP (!) 161/79 (BP Location: Right Arm)   Pulse 66   Temp 98 F (36.7 C) (Oral)   Resp 18   SpO2 97%   Visual Acuity Right Eye Distance:   Left Eye Distance:   Bilateral Distance:    Right Eye Near:   Left Eye Near:    Bilateral Near:     Physical Exam Vitals and nursing note reviewed.  Constitutional:      Appearance: Normal appearance.  HENT:     Head: Normocephalic and atraumatic.     Right Ear: External ear normal.     Left Ear: External ear normal.     Nose: Nose normal.     Mouth/Throat:     Mouth: Mucous membranes are moist.  Eyes:     Conjunctiva/sclera: Conjunctivae normal.  Cardiovascular:     Rate and Rhythm: Normal rate.  Pulmonary:     Effort: Pulmonary effort is normal. No respiratory distress.  Musculoskeletal:        General: Normal range of motion.  Skin:    Findings: Rash present.  Neurological:     General: No focal deficit present.     Mental Status: She is alert and oriented to person, place, and time.  Psychiatric:        Mood and Affect: Mood normal.        Behavior: Behavior normal.      UC Treatments / Results  Labs (all labs ordered are listed, but only abnormal results are displayed) Labs Reviewed - No data to display  EKG   Radiology No results found.  Procedures Procedures (including critical care time)  Medications Ordered in UC Medications  dexamethasone (DECADRON) injection 10 mg (10 mg Intramuscular Given 11/08/23 1026)    Initial Impression /  Assessment and Plan / UC Course  I have reviewed the triage vital signs and the nursing notes.  Pertinent labs & imaging results that were available during my care of the patient were reviewed by me and considered in my medical decision making (see chart for details).  Vitals and triage reviewed, patient is hemodynamically stable.  Itchy red rash to bilateral arms and anterior thighs.  Unclear etiology, not spreading or worsening.  Advised antihistamine, will do one-time steroid injection advise discontinuation of triamcinolone as prolonged use can cause skin thinning.  Encouraged dermatology follow-up since rash has been present for over a month now.  Plan of care, follow-up care return precautions given, no questions at this time.    Final Clinical Impressions(s) / UC Diagnoses   Final diagnoses:  Rash and nonspecific skin eruption     Discharge Instructions      Continue using hypoallergenic laundry detergent, switch to an unscented soap like Dove sensitive skin.  Pat your skin dry and apply an unscented lotion like Aquaphor or Lubriderm.  Stop using the triamcinolone ointment for at least a week, then you can resume for 7 days.  This should not be used for longer than 7 days as it may cause skin thinning.  Take the antihistamine daily.  For any breakthrough itching you can take the hydroxyzine or Benadryl, these may cause sedation.   Follow-up with a dermatologist for further investigation and advanced evaluation of your rash if it continues.     ED Prescriptions     Medication Sig Dispense Auth. Provider   cetirizine (ZYRTEC ALLERGY) 10 MG tablet Take 1 tablet (10 mg total) by mouth daily. 30 tablet Shawndale Kilpatrick, [place] N, [place]      PDMP not reviewed this encounter.   Moody Robben, [place] N, [place] 11/08/23 1036

## 2023-11-08 NOTE — Discharge Instructions (Addendum)
Continue using hypoallergenic laundry detergent, switch to an unscented soap like Dove sensitive skin.  Pat your skin dry and apply an unscented lotion like Aquaphor or Lubriderm.  Stop using the triamcinolone ointment for at least a week, then you can resume for 7 days.  This should not be used for longer than 7 days as it may cause skin thinning.  Take the antihistamine daily.  For any breakthrough itching you can take the hydroxyzine or Benadryl, these may cause sedation.   Follow-up with a dermatologist for further investigation and advanced evaluation of your rash if it continues.

## 2023-11-27 DIAGNOSIS — E1065 Type 1 diabetes mellitus with hyperglycemia: Secondary | ICD-10-CM | POA: Diagnosis not present

## 2023-11-28 ENCOUNTER — Other Ambulatory Visit (HOSPITAL_COMMUNITY): Payer: Self-pay | Admitting: Internal Medicine

## 2023-12-11 ENCOUNTER — Encounter: Payer: Self-pay | Admitting: Family Medicine

## 2023-12-11 ENCOUNTER — Ambulatory Visit (INDEPENDENT_AMBULATORY_CARE_PROVIDER_SITE_OTHER): Payer: PPO | Admitting: Family Medicine

## 2023-12-11 VITALS — BP 148/60 | HR 64 | Temp 97.7°F | Ht 64.0 in | Wt 154.0 lb

## 2023-12-11 DIAGNOSIS — I10 Essential (primary) hypertension: Secondary | ICD-10-CM

## 2023-12-11 DIAGNOSIS — E109 Type 1 diabetes mellitus without complications: Secondary | ICD-10-CM

## 2023-12-11 DIAGNOSIS — E785 Hyperlipidemia, unspecified: Secondary | ICD-10-CM

## 2023-12-11 DIAGNOSIS — I480 Paroxysmal atrial fibrillation: Secondary | ICD-10-CM | POA: Diagnosis not present

## 2023-12-11 DIAGNOSIS — Z0001 Encounter for general adult medical examination with abnormal findings: Secondary | ICD-10-CM | POA: Diagnosis not present

## 2023-12-11 DIAGNOSIS — Z23 Encounter for immunization: Secondary | ICD-10-CM

## 2023-12-11 DIAGNOSIS — L209 Atopic dermatitis, unspecified: Secondary | ICD-10-CM | POA: Diagnosis not present

## 2023-12-11 DIAGNOSIS — Z0000 Encounter for general adult medical examination without abnormal findings: Secondary | ICD-10-CM | POA: Insufficient documentation

## 2023-12-11 MED ORDER — MOMETASONE FUROATE 0.1 % EX OINT: TOPICAL_OINTMENT | Freq: Every day | CUTANEOUS | 0 refills | Status: DC

## 2023-12-11 NOTE — Assessment & Plan Note (Signed)
 Followed by endo, well controlled on tandem and dexcom. A1c and uACR UTD. Foot exam with podiatry. Vaccines refused. Retinal eye exam UTD. Recommend heart healthy diet such as Mediterranean diet with whole grains, fruits, vegetable, fish, lean meats, nuts, and olive oil. Limit salt. Encouraged moderate walking, 3-5 times/week for 30-50 minutes each session. Aim for at least 150 minutes.week. Goal should be pace of 3 miles/hours, or walking 1.5 miles in 30 minutes. Seek medical care for urinary frequency, extreme thirst, vision changes, lightheadedness, dizziness.

## 2023-12-11 NOTE — Assessment & Plan Note (Signed)
 Previous labs showed elevated cholesterol. Discussed risks vs benefits of statin and she would not like to start medication at this time.  I recommend consuming a heart healthy diet such as Mediterranean diet or DASH diet with whole grains, fruits, vegetable, fish, lean meats, nuts, and olive oil. Limit sweets and processed foods. I also encourage moderate intensity exercise 150 minutes weekly. This is 3-5 times weekly for 30-50 minutes each session. Goal should be pace of 3 miles/hours, or walking 1.5 miles in 30 minutes. The 10-year ASCVD risk score (Arnett DK, et al., 2019) is: 63.9%

## 2023-12-11 NOTE — Patient Instructions (Signed)
 It was great to meet you today and I'm excited to have you join the [facility] practice. I hope you had a positive experience today! If you feel so inclined, please feel free to recommend our practice to friends and family. Kurtis Bushman, FNP-C

## 2023-12-11 NOTE — Assessment & Plan Note (Signed)
 Pt here today to establish care, medical history was reviewed and routine physical exam with labs was performed. Recommend 150 minutes of moderate intensity exercise weekly and consuming a well-balanced diet. Advised to stop smoking if a smoker, avoid smoking if a non-smoker, limit alcohol consumption to 1 drink per day for women and 2 drinks per day for men, and avoid illicit drug use. Counseled on the importance of sunscreen use. Counseled in mental health awareness and when to seek medical care. Vaccine maintenance discussed. Appropriate health maintenance items reviewed. Return to office in 1 year for annual physical exam.

## 2023-12-11 NOTE — Addendum Note (Signed)
 Addended by: Arta Silence on: 12/11/2023 03:45 PM   Modules accepted: Orders

## 2023-12-11 NOTE — Assessment & Plan Note (Signed)
 Uncontrolled on losartan 25mg  daily. She will monitor at home and return to office in 1 week.  Recommend heart healthy diet such as Mediterranean diet with whole grains, fruits, vegetable, fish, lean meats, nuts, and olive oil. Limit salt. Encouraged moderate walking, 3-5 times/week for 30-50 minutes each session. Aim for at least 150 minutes.week. Goal should be pace of 3 miles/hours, or walking 1.5 miles in 30 minutes. Avoid tobacco products. Avoid excess alcohol. Take medications as prescribed and bring medications and blood pressure log with cuff to each office visit. Seek medical care for chest pain, palpitations, shortness of breath with exertion, dizziness/lightheadedness, vision changes, recurrent headaches, or swelling of extremities.

## 2023-12-11 NOTE — Progress Notes (Signed)
 New Patient Office Visit  Subjective    Patient ID: Gina [name], female    DOB: November 30, 1945  Age: 78 y.o. MRN: [id number]  CC: No chief complaint on file.   HPI Gina [name] presents to establish care. Oriented to practice routines and expectations. She has been seeing a PCP regularly and also sees Endocrinology, Cardiology, and podiatry. DM well controlled on tandem pump with Dexcom, last A1c 7.6%. BP elevated at OV today and outside of her endocrinologist office, readings have been normal there in December and September last year.  She does get eye exams annually and see a dentist regularly. She does have thermograms completed with Dr Biagio Quint. Concerns include a generalized dry, flaking, itching rash since christmas similar to a rash she had several years ago. She has tried triamcinolone, hydrocortisone, calamine, hydroxyzine, silver cream, calamine.  Breast CA screening: thermograms annually Colon CA screening: declined DEXA: DEXA scan declined, will consider Tobacco: non-smoker Vaccines:  tdap today    Outpatient Encounter Medications as of 12/11/2023  Medication Sig   apixaban (ELIQUIS) 5 MG TABS tablet Take 1 tablet (5 mg total) by mouth 2 (two) times daily.   Ascorbic Acid (VITAMIN C) 1000 MG tablet Take 1,000 mg by mouth daily.    ASHWAGANDHA PO Take 380 mg by mouth daily.   aspirin EC 81 MG tablet Take 81 mg by mouth daily. Swallow whole.   cetirizine (ZYRTEC ALLERGY) 10 MG tablet Take 1 tablet (10 mg total) by mouth daily.   Cholecalciferol (VITAMIN D PO) Take 1 drop by mouth daily. vitamin d3 plus k2   Coenzyme Q10 (COQ10) 200 MG CAPS Take 200 mg by mouth daily.   Continuous Blood Gluc Sensor (DEXCOM G6 SENSOR) MISC by Does not apply route.   Continuous Blood Gluc Transmit (DEXCOM G6 TRANSMITTER) MISC by Does not apply route.   Cyanocobalamin (VITAMIN B-12) 5000 MCG SUBL Place 5,000 mcg under the tongue daily.   diltiazem (CARDIZEM) 30 MG tablet Take 30 mg by mouth  daily.   glucose blood (ONE TOUCH ULTRA TEST) test strip Use asto check blood sugar 5 times a day   insulin aspart (NOVOLOG) 100 UNIT/ML injection USE MAX OF 60 UNITS DAILY VIA INSULIN PUMP.   insulin aspart (NOVOLOG) 100 UNIT/ML injection Inject into the skin.   losartan (COZAAR) 25 MG tablet TAKE 1 TABLET BY MOUTH EVERY DAY   Misc Natural Products (GLUCOSAMINE CHOND MSM FORMULA PO) Take 15 mLs by mouth daily.   mometasone (ELOCON) 0.1 % ointment Apply topically daily.   Multiple Vitamin (MULTIVITAMIN ADULT PO) Take 100 mg by mouth every morning. - Estrogen supplement   Multiple Vitamin (MULTIVITAMIN ADULT PO) Take 1 capsule by mouth 2 (two) times daily. Neurovision- For eyes and immune Health   Multiple Vitamin (MULTIVITAMIN PO) Take 12.5 mg by mouth daily. Iodoral-   Multiple Vitamin (MULTIVITAMIN PO) Take 50 mcg by mouth daily. Cholestar- Vitamin K27-   Multiple Vitamin (MULTIVITAMIN) capsule Glycemic Balance-Taking 2 capsules by mouth daily-   NON FORMULARY Take 15 mLs by mouth daily. MAG CAL VANILLA LIQUID   NON FORMULARY Take 2 capsules by mouth every morning. Keraflex-   Omega-3 Fatty Acids (SUPER OMEGA-3 PO) Take 2 capsules by mouth daily.   RESVERATROL PO Take 960 mg by mouth daily.   thyroid (ARMOUR THYROID) 15 MG tablet TAKE 1 TABLET DAILY WITH THE 30 MG TABLET (TOTAL OF 45 MG DAILY)   thyroid (ARMOUR THYROID) 30 MG tablet TAKE ONE TABLET DAILY ALONG WITH THE  15 MG TABLET. (TOTAL DOSAGE OF 45 MG DAILY)   Turmeric Curcumin 500 MG CAPS Take 482 mg by mouth daily.   Ferrous Sulfate (IRON PO) Take by mouth.   [DISCONTINUED] losartan (COZAAR) 25 MG tablet Take 1 tablet by mouth daily. (Patient not taking: Reported on 12/11/2023)   [DISCONTINUED] losartan (COZAAR) 25 MG tablet TAKE ONE TABLET BY MOUTH ONCE A DAY (Patient not taking: Reported on 12/11/2023)   Facility-Administered Encounter Medications as of 12/11/2023  Medication   topical emolient (BIAFINE) emulsion    Past Medical  History:  Diagnosis Date   Anxiety    new dx   Atrial fibrillation (HCC)    a. 03/2015 s/p RFCA;  b. CHA2DS2VASc = 5-->Eliquis.   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 12/2014 - due to AF RVR;  b. 03/2015 Echo: EF 60-65%, no rwma.   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 for Mets.: ER/PR POs., Her 2 Neu negative, Ki-67 12%   COLONOSCOPY     1 polyp    ELECTROPHYSIOLOGIC STUDY N/A 04/08/2015   PVI + CTI ablation + posterior Box lesion + Atach ablation by Dr Johney Frame   OVARIAN CYST SURGERY     TEE WITHOUT CARDIOVERSION N/A 04/07/2015   Procedure: TRANSESOPHAGEAL ECHOCARDIOGRAM (TEE);  Surgeon: Vesta Mixer, MD;  Location: [facility] ENDOSCOPY;  Service: Cardiovascular;  Laterality: N/A;   TONSILLECTOMY      Family History  Problem Relation Age of Onset   Breast cancer Mother 25   Heart failure Mother    Prostate cancer Maternal Uncle        died in his 2s   Heart attack Father    Diabetes Maternal Uncle 15    Social History   Socioeconomic History   Marital status: Married    Spouse name: Not on file   Number of children: Not on file   Years of education: Not on file   Highest education level: Not on file  Occupational History   Not on file  Tobacco Use   Smoking status: Never   Smokeless tobacco: Never   Vaping Use   Vaping status: Never Used  Substance and Sexual Activity   Alcohol use: No   Drug use: No   Sexual activity: Yes  Other Topics Concern   Not on file  Social History Narrative   Not on file   Social Drivers of Health   Financial Resource Strain: Not on file  Food Insecurity: Not on file  Transportation Needs: Not on file  Physical Activity: Not on file  Stress: Not on file  Social Connections: Not on file  Intimate Partner Violence: Not on file    Review of Systems  Constitutional: Negative.   HENT: Negative.  Eyes: Negative.   Respiratory: Negative.    Cardiovascular: Negative.   Gastrointestinal: Negative.   Genitourinary: Negative.   Musculoskeletal: Negative.   Skin:  Positive for itching and rash.  Neurological: Negative.   Endo/Heme/Allergies: Negative.   Psychiatric/Behavioral: Negative.    All other systems reviewed and are negative.       Objective    BP (!) 148/60   Pulse 64   Temp 97.7 F (36.5 C) (Oral)   Ht 5\' 4"  (1.626 m)   Wt 154 lb (69.9 kg)   SpO2 98%   BMI 26.43 kg/m     12/11/2023   10:34 AM 12/11/2023   10:26 AM 11/08/2023    9:52 AM  Vitals with BMI  Height  5\' 4"    Weight  154 lbs   BMI  26.42   Systolic 148 148 161  Diastolic 60 60 79  Pulse  64 66     Physical Exam Vitals and nursing note reviewed.  Constitutional:      Appearance: Normal appearance. She is normal weight.  HENT:     Head: Normocephalic and atraumatic.     Right Ear: Tympanic membrane, ear canal and external ear normal.     Left Ear: Tympanic membrane, ear canal and external ear normal.     Nose: Nose normal.     Mouth/Throat:     Mouth: Mucous membranes are moist.     Pharynx: Oropharynx is clear.  Eyes:     Extraocular Movements: Extraocular movements intact.     Conjunctiva/sclera: Conjunctivae normal.     Pupils: Pupils are equal, round, and reactive to light.  Cardiovascular:     Rate and Rhythm: Normal rate and regular  rhythm.     Pulses: Normal pulses.     Heart sounds: Normal heart sounds.  Pulmonary:     Effort: Pulmonary effort is normal.     Breath sounds: Normal breath sounds.  Abdominal:     General: Bowel sounds are normal.     Palpations: Abdomen is soft.  Musculoskeletal:        General: Normal range of motion.     Cervical back: Normal range of motion and neck supple.  Skin:    General: Skin is warm and dry.     Capillary Refill: Capillary refill takes less than 2 seconds.     Findings: Rash present. Rash is macular and urticarial.     Comments: Scattered erythematous rash to bilateral arms and legs as well as abdomen and back   Neurological:     General: No focal deficit present.     Mental Status: She is alert and oriented to person, place, and time. Mental status is at baseline.  Psychiatric:        Mood and Affect: Mood normal.        Behavior: Behavior normal.        Thought Content: Thought content normal.        Judgment: Judgment normal.         Assessment & Plan:   Problem List Items Addressed This Visit     Essential hypertension (Chronic)   Uncontrolled on losartan 25mg  daily. She will monitor at home and return to office in 1 week.  Recommend heart healthy diet such as Mediterranean diet with whole grains, fruits, vegetable, fish, lean meats, nuts, and olive oil. Limit salt. Encouraged moderate walking, 3-5 times/week for 30-50 minutes each session. Aim for at least 150 minutes.week. Goal should be pace of  3 miles/hours, or walking 1.5 miles in 30 minutes. Avoid tobacco products. Avoid excess alcohol. Take medications as prescribed and bring medications and blood pressure log with cuff to each office visit. Seek medical care for chest pain, palpitations, shortness of breath with exertion, dizziness/lightheadedness, vision changes, recurrent headaches, or swelling of extremities.       Dyslipidemia   Previous labs showed elevated cholesterol. Discussed risks vs  benefits of statin and she would not like to start medication at this time.  I recommend consuming a heart healthy diet such as Mediterranean diet or DASH diet with whole grains, fruits, vegetable, fish, lean meats, nuts, and olive oil. Limit sweets and processed foods. I also encourage moderate intensity exercise 150 minutes weekly. This is 3-5 times weekly for 30-50 minutes each session. Goal should be pace of 3 miles/hours, or walking 1.5 miles in 30 minutes. The 10-year ASCVD risk score (Arnett DK, et al., 2019) is: 63.9%       Diabetes type 1, controlled (HCC) (Chronic)   Followed by endo, well controlled on tandem and dexcom. A1c and uACR UTD. Foot exam with podiatry. Vaccines refused. Retinal eye exam UTD. Recommend heart healthy diet such as Mediterranean diet with whole grains, fruits, vegetable, fish, lean meats, nuts, and olive oil. Limit salt. Encouraged moderate walking, 3-5 times/week for 30-50 minutes each session. Aim for at least 150 minutes.week. Goal should be pace of 3 miles/hours, or walking 1.5 miles in 30 minutes. Seek medical care for urinary frequency, extreme thirst, vision changes, lightheadedness, dizziness.        Paroxysmal atrial fibrillation (HCC)   Rate controlled. History of ablation.       Atopic dermatitis   Start Elocon, return to office if symptoms persist.       Physical exam, annual - Primary   Pt here today to establish care, medical history was reviewed and routine physical exam with labs was performed. Recommend 150 minutes of moderate intensity exercise weekly and consuming a well-balanced diet. Advised to stop smoking if a smoker, avoid smoking if a non-smoker, limit alcohol consumption to 1 drink per day for women and 2 drinks per day for men, and avoid illicit drug use. Counseled on the importance of sunscreen use. Counseled in mental health awareness and when to seek medical care. Vaccine maintenance discussed. Appropriate health maintenance items  reviewed. Return to office in 1 year for annual physical exam.        Return in about 2 weeks (around 12/25/2023) for AWV and, hypertension.   Park Meo, FNP

## 2023-12-11 NOTE — Assessment & Plan Note (Signed)
 Start Elocon, return to office if symptoms persist.

## 2023-12-11 NOTE — Assessment & Plan Note (Signed)
 Rate controlled. History of ablation.

## 2023-12-16 ENCOUNTER — Other Ambulatory Visit: Payer: Self-pay

## 2023-12-17 DIAGNOSIS — L2989 Other pruritus: Secondary | ICD-10-CM | POA: Diagnosis not present

## 2023-12-17 DIAGNOSIS — L309 Dermatitis, unspecified: Secondary | ICD-10-CM | POA: Diagnosis not present

## 2023-12-23 ENCOUNTER — Other Ambulatory Visit: Payer: PPO

## 2023-12-25 DIAGNOSIS — E109 Type 1 diabetes mellitus without complications: Secondary | ICD-10-CM | POA: Diagnosis not present

## 2023-12-26 ENCOUNTER — Encounter: Payer: Self-pay | Admitting: Endocrinology

## 2023-12-26 ENCOUNTER — Ambulatory Visit: Admitting: Endocrinology

## 2023-12-26 ENCOUNTER — Ambulatory Visit: Payer: PPO | Admitting: Endocrinology

## 2023-12-26 VITALS — BP 118/60 | HR 70 | Resp 20 | Ht 64.0 in | Wt 151.4 lb

## 2023-12-26 DIAGNOSIS — E1065 Type 1 diabetes mellitus with hyperglycemia: Secondary | ICD-10-CM

## 2023-12-26 LAB — POCT GLYCOSYLATED HEMOGLOBIN (HGB A1C): Hemoglobin A1C: 6.3 % — AB (ref 4.0–5.6)

## 2023-12-26 NOTE — Progress Notes (Signed)
 Outpatient Endocrinology Note Gina Marsia Cino, MD  12/26/23  Patient's Name: Gina [name]    DOB: 05-03-1946    MRN: [id number]                                                    REASON OF VISIT: Follow up for type 1 diabetes mellitus  PCP: Gina Meo, FNP  HISTORY OF PRESENT ILLNESS:   Gina [name] is a 78 y.o. old female with past medical history listed below, is here for follow up for type 1 diabetes mellitus.   Pertinent Diabetes History: Patient was diagnosed with type 1 diabetes mellitus in 1984.    Chronic Diabetes Complications : Retinopathy: no. Last ophthalmology exam was done on annually, following with ophthalmology regularly.  Nephropathy: no, on losartan. Peripheral neuropathy: no Coronary artery disease: no Stroke: no  Relevant comorbidities and cardiovascular risk factors: Obesity: no Body mass index is 25.99 kg/m.  Hypertension: Yes   Hyperlipidemia : Yes, not on statin. Patient declined.  HYPERCHOLESTEROLEMIA: Her LDL has been persistently high and refuses to take a statin drug including pravastatin even though it had no side effects previously. Previously her LDL particle number was high at 1621. She was given a prescription for Crestor which she tried for 3-4 weeks but did not continue because she felt it was giving her headaches and making her tired. She prefers to use natural remedies but these have not been very effective.   She is taking Zetia regularly as prescribed.  She had been recommended Leqvio which she agreed to try but because of high out-of-pocket expense and no patient assistance available she is not taking this   Triglyceride is consistently normal Usually has a good diet   Current / Home Diabetic regimen includes:  Tandem t:slim insulin pump on control IQ using NovoLog U100.  Insulin Pump setting:  Basal MN- 0.300u/hour 2AM- 0.450  4AM- 1.10 6AM- 0.90 8AM-    0.900 11:30AM1.00 2PM-     1.050 6PM-     0.700 10PM-    0.300  Bolus CHO Ratio (1unit:CHO) MN- 1:8 2PM- 1:7 6PM- 1:8  Correction/Sensitivity: MN- 1:50 2PM- 1:40 10PM- 1:50  Target: 100   Active insulin time: 5 hours  Prior diabetic medications:   CONTINUOUS GLUCOSE MONITORING SYSTEM (CGMS) / INSULIN PUMP INTERPRETATION:                         Tandem Pump & Sensor Download (Reviewed and summarized below.) Pump: Dexcom G6 and Tandem t:slim Dates: February 28 to December 26, 2023, 14 days Glucose Management Indicator: 6.9% Time in range :80%       Average total daily insulin:  34 units, Basal: 60%, Bolus: 40%,  (Manual Bolus: 70%,  Control IQ Bolus: 30%)    Trends:  Random hyperglycemia postprandially with blood sugar up to 200-260 range and mostly hyperglycemia is low 200 range related to  no meal bolus or sometimes she uses manual bolus for the meals.  Blood sugar in between the meals are acceptable.  She uses sleep mode and acceptable blood sugar overnight.  No hypoglycemia.  Hypoglycemia: Patient has rare / minor hypoglycemic episodes. Patient has hypoglycemia awareness.    Factors modifying glucose control: 1.  Diabetic diet assessment: 3 meals a day.  2.  Staying active or exercising:  Working on her farm, housework and other activities.  3.  Medication compliance: compliant all of the time.  # Primary hypothyroidism : -Patient has been taking Armour Thyroid which she prefers 45 mg daily.  # 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 been usually mild, longstanding intermittent, not on any diuretics or SSRIs. Urine osmolality as of 04/2015 was 618. She was told to cut back on her fluid intake but she is not doing so and she says she is trying to drink more water since she thought this was healthy. Sodium is variable but generally between 129-135. Has not had any symptoms from hyponatremia   Interval history  Pump and CGM data as reviewed above.  Mostly acceptable blood sugar.  Hemoglobin A1c improved to 6.3% today.  She reports she had  rashes in the body, following with dermatology, concerned about allergic reaction to the supplements she has been taking other medication related.  She has been using hydrocortisone cream and helping with the rashes.  No other complaints today.  REVIEW OF SYSTEMS As per history of present illness.   PAST MEDICAL HISTORY: Past Medical History:  Diagnosis Date   Anxiety    new dx   Atrial fibrillation (HCC)    a. 03/2015 s/p RFCA;  b. CHA2DS2VASc = 5-->Eliquis.   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 12/2014 - due to AF RVR;  b. 03/2015 Echo: EF 60-65%, no rwma.   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: 1/1 Node Positive for Mets.: ER/PR POs., Her 2 Neu negative, Ki-67 12%   COLONOSCOPY     1 polyp    ELECTROPHYSIOLOGIC STUDY N/A 04/08/2015   PVI + CTI ablation + posterior Box lesion + Atach ablation by Dr Johney Frame   OVARIAN CYST SURGERY     TEE WITHOUT CARDIOVERSION N/A 04/07/2015   Procedure: TRANSESOPHAGEAL ECHOCARDIOGRAM (TEE);  Surgeon: Vesta Mixer, MD;  Location: [facility] ENDOSCOPY;  Service: Cardiovascular;  Laterality: N/A;   TONSILLECTOMY      ALLERGIES: Allergies  Allergen Reactions   Rosuvastatin Calcium     Headaches, fatigue   Rosuvastatin Calcium Other (See Comments)    Headaches, fatigue     FAMILY HISTORY:  Family History  Problem Relation Age of Onset   Breast cancer Mother 51   Heart failure Mother    Prostate cancer Maternal Uncle        died in his 51s   Heart attack Father    Diabetes Maternal Uncle 15    SOCIAL HISTORY: Social History   Socioeconomic History   Marital status: Married    Spouse name: Not on file   Number of children: Not on file   Years of education: Not on file   Highest education level: Not on  file  Occupational History   Not on file  Tobacco Use   Smoking status: Never   Smokeless tobacco: Never  Vaping Use   Vaping status: Never Used  Substance and Sexual Activity   Alcohol use: No   Drug use: No   Sexual activity: Yes  Other Topics Concern   Not on file  Social History Narrative   Not on file   Social Drivers of Health   Financial Resource Strain: Not on file  Food Insecurity: Not on file  Transportation Needs: Not on file  Physical Activity: Not on file  Stress: Not on file  Social Connections: Not on file    MEDICATIONS:  Current Outpatient Medications  Medication Sig Dispense Refill   apixaban (ELIQUIS) 5 MG TABS tablet Take 1 tablet (5 mg total) by mouth 2 (two) times daily. 180 tablet 2   Ascorbic Acid (VITAMIN C) 1000 MG tablet Take 1,000 mg by mouth daily.      ASHWAGANDHA PO Take 380 mg by mouth daily.     aspirin EC 81 MG tablet Take 81 mg by mouth daily. Swallow whole.     cetirizine (ZYRTEC ALLERGY) 10 MG tablet Take 1 tablet (10 mg total) by mouth daily. 30 tablet 0   Cholecalciferol (VITAMIN D PO) Take 1 drop by mouth daily. vitamin d3 plus k2     Coenzyme Q10 (COQ10) 200 MG CAPS Take 200 mg by mouth daily.     Continuous Blood Gluc Sensor (DEXCOM G6 SENSOR) MISC by Does not apply route.     Continuous Blood Gluc Transmit (DEXCOM G6 TRANSMITTER) MISC by Does not apply route.     Cyanocobalamin (VITAMIN B-12) 5000 MCG SUBL Place 5,000 mcg under the tongue daily.     diltiazem (CARDIZEM) 30 MG tablet  Take 30 mg by mouth daily.     Ferrous Sulfate (IRON PO) Take by mouth.     glucose blood (ONE TOUCH ULTRA TEST) test strip Use asto check blood sugar 5 times a day 200 each 5   insulin aspart (NOVOLOG) 100 UNIT/ML injection USE MAX OF 60 UNITS DAILY VIA INSULIN PUMP. 50 mL 3   insulin aspart (NOVOLOG) 100 UNIT/ML injection Inject into the skin.     losartan (COZAAR) 25 MG tablet TAKE 1 TABLET BY MOUTH EVERY DAY 90 tablet 3   Misc Natural Products (GLUCOSAMINE CHOND MSM FORMULA PO) Take 15 mLs by mouth daily.     mometasone (ELOCON) 0.1 % ointment Apply topically daily. 45 g 0   Multiple Vitamin (MULTIVITAMIN ADULT PO) Take 100 mg by mouth every morning. - Estrogen supplement     Multiple Vitamin (MULTIVITAMIN ADULT PO) Take 1 capsule by mouth 2 (two) times daily. Neurovision- For eyes and immune Health     Multiple Vitamin (MULTIVITAMIN PO) Take 12.5 mg by mouth daily. Iodoral-     Multiple Vitamin (MULTIVITAMIN PO) Take 50 mcg by mouth daily. Cholestar- Vitamin K27-     Multiple Vitamin (MULTIVITAMIN) capsule Glycemic Balance-Taking 2 capsules by mouth daily-     NON FORMULARY Take 15 mLs by mouth daily. MAG CAL VANILLA LIQUID     NON FORMULARY Take 2 capsules by mouth every morning. Keraflex-     Omega-3 Fatty Acids (SUPER OMEGA-3 PO) Take 2 capsules by mouth daily.     RESVERATROL PO Take 960 mg by mouth daily.     thyroid (ARMOUR THYROID) 15 MG tablet TAKE 1 TABLET DAILY WITH THE 30 MG TABLET (TOTAL OF 45 MG  DAILY) 90 tablet 3   thyroid (ARMOUR THYROID) 30 MG tablet TAKE ONE TABLET DAILY ALONG WITH THE 15 MG TABLET. (TOTAL DOSAGE OF 45 MG DAILY) 90 tablet 1   Turmeric Curcumin 500 MG CAPS Take 482 mg by mouth daily.     No current facility-administered medications for this visit.   Facility-Administered Medications Ordered in Other Visits  Medication Dose Route Frequency Provider Last Rate Last Admin   topical emolient (BIAFINE) emulsion   Topical Daily Lonie Peak, MD   Given at  05/28/12 1400    PHYSICAL EXAM: Vitals:   12/26/23 1615  BP: 118/60  Pulse: 70  Resp: 20  SpO2: 97%  Weight: 151 lb 6.4 oz (68.7 kg)  Height: 5\' 4"  (1.626 m)    Body mass index is 25.99 kg/m.  Wt Readings from Last 3 Encounters:  12/26/23 151 lb 6.4 oz (68.7 kg)  12/11/23 154 lb (69.9 kg)  09/25/23 153 lb 12.8 oz (69.8 kg)    General: Well developed, well nourished female in no apparent distress.  HEENT: AT/[place], no external lesions.  Eyes: Conjunctiva clear and no icterus. Neck: Neck supple  Lungs: Respirations not labored Neurologic: Alert, oriented, normal speech Extremities / Skin: Dry.  Psychiatric: Does not appear depressed or anxious  Diabetic Foot Exam - Simple   No data filed    LABS Reviewed Lab Results  Component Value Date   HGBA1C 6.3 (A) 12/26/2023   HGBA1C 7.6 (H) 09/19/2023   HGBA1C 7.3 (H) 05/21/2023   No results found for: "FRUCTOSAMINE" Lab Results  Component Value Date   CHOL 238 (H) 09/19/2023   HDL 78 09/19/2023   LDLCALC 144 (H) 09/19/2023   LDLDIRECT 90.0 05/28/2022   TRIG 61 09/19/2023   CHOLHDL 3.1 09/19/2023   Lab Results  Component Value Date   MICRALBCREAT 5 09/19/2023   MICRALBCREAT 2.0 09/18/2022   Lab Results  Component Value Date   CREATININE 0.72 09/19/2023   Lab Results  Component Value Date   GFR 62.73 05/21/2023    ASSESSMENT / PLAN  1. Uncontrolled type 1 diabetes mellitus with hyperglycemia (HCC)    Diabetes Mellitus type 1, complicated by no known complications.  - Diabetic status / severity: uncontrolled.   Lab Results  Component Value Date   HGBA1C 6.3 (A) 12/26/2023    - Hemoglobin A1c goal <7% worsening with random postprandial hyperglycemia.  Overall acceptable blood sugar.  - Medications:  Insulin pump setting no change in setting today.  - Home glucose testing: continue CGM and check blood glucose as needed.  - Discussed/ Gave Hypoglycemia treatment plan.  # Consult : not required at  this time.   # Annual urine for microalbuminuria/ creatinine ratio, no microalbuminuria currently. Last  Lab Results  Component Value Date   MICRALBCREAT 5 09/19/2023    # Foot check nightly.  # Annual dilated diabetic eye exams.   - Diet: Make healthy diabetic food choices - Life style / activity / exercise: discussed.   2. Blood pressure  -  BP Readings from Last 1 Encounters:  12/26/23 118/60    - Control is in target.  - No change in current plans.  3. Lipid status / Hyperlipidemia - Last  Lab Results  Component Value Date   LDLCALC 144 (H) 09/19/2023   - Continue Zetia 10 mg daily.  Patient declined statin therapy.  Consider oral Livalo.  # Primary hypothyroidism -Continue Armour Thyroid 45 mg daily.  # Hyponatremia ?  SIADH -This is  mild and asymptomatic.  Patient serum sodium 135.  Will check serum sodium periodically.  Advised for limitation of fluid/water intake.  Patient was suggested for establishing regular care with primary care provider by Dr. Lucianne Muss in last visit.  Diagnoses and all orders for this visit:  Uncontrolled type 1 diabetes mellitus with hyperglycemia (HCC) -     POCT glycosylated hemoglobin (Hb A1C)     DISPOSITION Follow up in clinic in 3  months suggested.   All questions answered and patient verbalized understanding of the plan.  Gina Anmol Paschen, MD [facility] [address] [place] [address], [address] [place], [place] [address] Phone # [phone]  At least part of this note was generated using voice recognition software. Inadvertent word errors may have occurred, which were not recognized during the proofreading process.

## 2023-12-27 ENCOUNTER — Encounter: Payer: Self-pay | Admitting: Endocrinology

## 2023-12-31 DIAGNOSIS — L270 Generalized skin eruption due to drugs and medicaments taken internally: Secondary | ICD-10-CM | POA: Diagnosis not present

## 2023-12-31 DIAGNOSIS — D225 Melanocytic nevi of trunk: Secondary | ICD-10-CM | POA: Diagnosis not present

## 2023-12-31 DIAGNOSIS — D492 Neoplasm of unspecified behavior of bone, soft tissue, and skin: Secondary | ICD-10-CM | POA: Diagnosis not present

## 2024-01-08 ENCOUNTER — Other Ambulatory Visit: Payer: Self-pay

## 2024-01-08 DIAGNOSIS — E1065 Type 1 diabetes mellitus with hyperglycemia: Secondary | ICD-10-CM

## 2024-01-08 MED ORDER — INSULIN ASPART 100 UNIT/ML IJ SOLN: INTRAMUSCULAR | 3 refills | Status: AC

## 2024-01-08 NOTE — Telephone Encounter (Signed)
 Requested Prescriptions   Signed Prescriptions Disp Refills   insulin aspart (NOVOLOG) 100 UNIT/ML injection 50 mL 3    Sig: USE MAX OF 60 UNITS DAILY VIA INSULIN PUMP.    Authorizing Provider: THAPA, [place]    Ordering User: Beverely Pace

## 2024-01-09 ENCOUNTER — Ambulatory Visit: Payer: PPO | Admitting: Family Medicine

## 2024-01-09 VITALS — BP 128/78 | HR 63 | Temp 97.5°F | Ht 64.0 in | Wt 149.6 lb

## 2024-01-09 DIAGNOSIS — Z0000 Encounter for general adult medical examination without abnormal findings: Secondary | ICD-10-CM | POA: Insufficient documentation

## 2024-01-09 DIAGNOSIS — I10 Essential (primary) hypertension: Secondary | ICD-10-CM

## 2024-01-09 DIAGNOSIS — Z0001 Encounter for general adult medical examination with abnormal findings: Secondary | ICD-10-CM | POA: Diagnosis not present

## 2024-01-09 NOTE — Progress Notes (Signed)
 Subjective:   Gina [name] is a 78 y.o. female who presents for Medicare Annual (Subsequent) preventive examination.  Visit Complete: In person  Patient Medicare AWV questionnaire was completed by the patient on 3/27; I have confirmed that all information answered by patient is correct and no changes since this date.  Cardiac Risk Factors include: diabetes mellitus;hypertension     Objective:    Today's Vitals   01/09/24 1117  BP: 128/78  Pulse: 63  Temp: (!) 97.5 F (36.4 C)  SpO2: 98%  Weight: 149 lb 9.6 oz (67.9 kg)  Height: 5\' 4"  (1.626 m)   Body mass index is 25.68 kg/m.     01/12/2023   11:16 AM 02/10/2022   10:50 PM 04/08/2015    6:12 AM 04/07/2015    7:46 AM 03/06/2015   10:17 AM 01/12/2015    4:06 PM 01/11/2015   12:39 PM  Advanced Directives  Does Patient Have a Medical Advance Directive? No No No No No No No  Would patient like information on creating a medical advance directive? No - Patient declined  No - patient declined information No - patient declined information  No - patient declined information No - patient declined information    Current Medications (verified) Outpatient Encounter Medications as of 01/09/2024  Medication Sig   apixaban (ELIQUIS) 5 MG TABS tablet Take 1 tablet (5 mg total) by mouth 2 (two) times daily.   Ascorbic Acid (VITAMIN C) 1000 MG tablet Take 1,000 mg by mouth daily.    ASHWAGANDHA PO Take 380 mg by mouth daily.   aspirin EC 81 MG tablet Take 81 mg by mouth daily. Swallow whole.   cetirizine (ZYRTEC ALLERGY) 10 MG tablet Take 1 tablet (10 mg total) by mouth daily.   Cholecalciferol (VITAMIN D PO) Take 1 drop by mouth daily. vitamin d3 plus k2   Coenzyme Q10 (COQ10) 200 MG CAPS Take 200 mg by mouth daily. (Patient not taking: Reported on 01/09/2024)   Continuous Blood Gluc Sensor (DEXCOM G6 SENSOR) MISC by Does not apply route.   Continuous Blood Gluc Transmit (DEXCOM G6 TRANSMITTER) MISC by Does not apply route.    Cyanocobalamin (VITAMIN B-12) 5000 MCG SUBL Place 5,000 mcg under the tongue daily.   diltiazem (CARDIZEM) 30 MG tablet Take 30 mg by mouth daily.   Ferrous Sulfate (IRON PO) Take by mouth.   glucose blood (ONE TOUCH ULTRA TEST) test strip Use asto check blood sugar 5 times a day   insulin aspart (NOVOLOG) 100 UNIT/ML injection Inject into the skin.   insulin aspart (NOVOLOG) 100 UNIT/ML injection USE MAX OF 60 UNITS DAILY VIA INSULIN PUMP.   losartan (COZAAR) 25 MG tablet TAKE 1 TABLET BY MOUTH EVERY DAY   Misc Natural Products (GLUCOSAMINE CHOND MSM FORMULA PO) Take 15 mLs by mouth daily.   mometasone (ELOCON) 0.1 % ointment Apply topically daily. (Patient not taking: Reported on 01/09/2024)   Multiple Vitamin (MULTIVITAMIN ADULT PO) Take 100 mg by mouth every morning. - Estrogen supplement   Multiple Vitamin (MULTIVITAMIN ADULT PO) Take 1 capsule by mouth 2 (two) times daily. Neurovision- For eyes and immune Health   Multiple Vitamin (MULTIVITAMIN PO) Take 12.5 mg by mouth daily. Iodoral- (Patient not taking: Reported on 01/09/2024)   Multiple Vitamin (MULTIVITAMIN PO) Take 50 mcg by mouth daily. Cholestar- Vitamin K27-   Multiple Vitamin (MULTIVITAMIN) capsule Glycemic Balance-Taking 2 capsules by mouth daily-   NON FORMULARY Take 15 mLs by mouth daily. MAG CAL VANILLA LIQUID  NON FORMULARY Take 2 capsules by mouth every morning. Keraflex-   Omega-3 Fatty Acids (SUPER OMEGA-3 PO) Take 2 capsules by mouth daily.   RESVERATROL PO Take 960 mg by mouth daily.   thyroid (ARMOUR THYROID) 15 MG tablet TAKE 1 TABLET DAILY WITH THE 30 MG TABLET (TOTAL OF 45 MG DAILY)   thyroid (ARMOUR THYROID) 30 MG tablet TAKE ONE TABLET DAILY ALONG WITH THE 15 MG TABLET. (TOTAL DOSAGE OF 45 MG DAILY)   Turmeric Curcumin 500 MG CAPS Take 482 mg by mouth daily.   Facility-Administered Encounter Medications as of 01/09/2024  Medication   topical emolient (BIAFINE) emulsion    Allergies (verified) Rosuvastatin  calcium and Rosuvastatin calcium   History: Past Medical History:  Diagnosis Date   Anxiety    new dx   Atrial fibrillation (HCC)    a. 03/2015 s/p RFCA;  b. CHA2DS2VASc = 5-->Eliquis.   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 12/2014 - due to AF RVR;  b. 03/2015 Echo: EF 60-65%, no rwma.   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: 1/1 Node Positive for Mets.: ER/PR POs., Her 2 Neu negative, Ki-67 12%   COLONOSCOPY     1 polyp    ELECTROPHYSIOLOGIC STUDY N/A 04/08/2015   PVI + CTI ablation + posterior Box lesion + Atach ablation by Dr Johney Frame   OVARIAN CYST SURGERY     TEE WITHOUT CARDIOVERSION N/A 04/07/2015   Procedure: TRANSESOPHAGEAL ECHOCARDIOGRAM (TEE);  Surgeon: Vesta Mixer, MD;  Location: [facility] ENDOSCOPY;  Service: Cardiovascular;  Laterality: N/A;   TONSILLECTOMY     Family History  Problem Relation Age of Onset   Breast cancer Mother 25   Heart failure Mother    Prostate cancer Maternal Uncle        died in his 78s   Heart attack Father    Diabetes Maternal Uncle 15   Social History   Socioeconomic History   Marital status: Married    Spouse name: Not on file   Number of children: Not on file   Years of education: Not on file   Highest education level: Not on file  Occupational History   Not on file  Tobacco Use    Smoking status: Never   Smokeless tobacco: Never  Vaping Use   Vaping status: Never Used  Substance and Sexual Activity   Alcohol use: No   Drug use: No   Sexual activity: Yes  Other Topics Concern   Not on file  Social History Narrative   Not on file   Social Drivers of Health   Financial Resource Strain: Low Risk  (01/09/2024)   Overall Financial Resource Strain (CARDIA)    Difficulty of Paying Living Expenses: Not hard at all  Food  Insecurity: No Food Insecurity (01/09/2024)   Hunger Vital Sign    Worried About Running Out of Food in the Last Year: Never true    Ran Out of Food in the Last Year: Never true  Transportation Needs: No Transportation Needs (01/09/2024)   PRAPARE - Administrator, Civil Service (Medical): No    Lack of Transportation (Non-Medical): No  Physical Activity: Insufficiently Active (01/09/2024)   Exercise Vital Sign    Days of Exercise per Week: 3 days    Minutes of Exercise per Session: 30 min  Stress: No Stress Concern Present (01/09/2024)   [employer] of Occupational Health - Occupational Stress Questionnaire    Feeling of Stress : Not at all  Social Connections: Socially Integrated (01/09/2024)   Social Connection and Isolation Panel [NHANES]    Frequency of Communication with Friends and Family: More than three times a week    Frequency of Social Gatherings with Friends and Family: Twice a week    Attends Religious Services: More than 4 times per year    Active Member of [employer] or Organizations: Yes    Attends Engineer, structural: More than 4 times per year    Marital Status: Married    Tobacco Counseling Counseling given: Not Answered   Clinical Intake:  Pre-visit preparation completed: Yes  Pain : No/denies pain     BMI - recorded: 25.68 Nutritional Status: BMI 25 -29 Overweight Nutritional Risks: None Diabetes: Yes CBG done?: No Did pt. bring in CBG monitor from home?: Yes Glucose Meter Downloaded?:  Yes  How often do you need to have someone help you when you read instructions, pamphlets, or other written materials from your doctor or pharmacy?: 5 - Always What is the last grade level you completed in school?: 12th  Interpreter Needed?: No      Activities of Daily Living    01/09/2024   11:26 AM  In your present state of health, do you have any difficulty performing the following activities:  Hearing? 0  Vision? 1  Difficulty concentrating or making decisions? 0  Walking or climbing stairs? 0  Dressing or bathing? 0  Doing errands, shopping? 0  Preparing Food and eating ? N  Using the Toilet? N  In the past six months, have you accidently leaked urine? N  Do you have problems with loss of bowel control? N  Managing your Medications? N  Managing your Finances? N  Housekeeping or managing your Housekeeping? N    Patient Care Team: Park Meo, FNP as PCP - General (Family Medicine) Hillis Range, MD (Inactive) as PCP - Electrophysiology (Cardiology) Lyn Records, MD (Inactive) as PCP - Cardiology (Cardiology)  Indicate any recent Medical Services you may have received from other than [facility] providers in the past year (date may be approximate).     Assessment:   This is a routine wellness examination for Gina [name].  Hearing/Vision screen No results found.   Goals Addressed   None    Depression Screen    12/11/2023    3:41 PM  PHQ 2/9 Scores  PHQ - 2 Score 0  PHQ- 9 Score 0    Fall Risk    05/23/2023   10:08 AM 09/09/2019   10:41 AM  Fall Risk   Falls in the past year? 0 0  Comment  Emmi Telephone Survey: data to providers prior to load  Number falls in past yr: 0   Injury with Fall? 0   Risk  for fall due to : No Fall Risks   Follow up Falls prevention discussed;Falls evaluation completed     MEDICARE RISK AT HOME:    TIMED UP AND GO:  Was the test performed?  Yes  Length of time to ambulate 10 feet: 8  sec Gait steady and fast without use  of assistive device    Cognitive Function:        Immunizations Immunization History  Administered Date(s) Administered   Moderna Sars-Covid-2 Vaccination 01/07/2020   Tdap 12/11/2023    TDAP status: Up to date  Flu Vaccine status: Up to date, declines  Pneumococcal vaccine status: Up to date, declines  Covid-19 vaccine status: Information provided on how to obtain vaccines. , declines  Qualifies for Shingles Vaccine? No  declines Zostavax completed No   Shingrix Completed?: No.    Education has been provided regarding the importance of this vaccine. Patient has been advised to call insurance company to determine out of pocket expense if they have not yet received this vaccine. Advised may also receive vaccine at local pharmacy or Health Dept. Verbalized acceptance and understanding.  Screening Tests Health Maintenance  Topic Date Due   COVID-19 Vaccine (2 - Moderna risk series) 02/04/2020   OPHTHALMOLOGY EXAM  01/24/2022   INFLUENZA VACCINE  01/13/2024 (Originally 05/16/2023)   Zoster Vaccines- Shingrix (1 of 2) 03/09/2024 (Originally 06/26/1965)   Pneumonia Vaccine 42+ Years old (1 of 2 - PCV) 12/10/2024 (Originally 06/25/1952)   DEXA SCAN  12/10/2024 (Originally 06/25/2009)   Hepatitis C Screening  12/10/2024 (Originally [birth year]/1962)   HEMOGLOBIN A1C  06/27/2024   Diabetic kidney evaluation - eGFR measurement  09/18/2024   Diabetic kidney evaluation - Urine ACR  09/18/2024   FOOT EXAM  09/23/2024   Medicare Annual Wellness (AWV)  01/08/2025   DTaP/Tdap/Td (2 - Td or Tdap) 12/10/2033   HPV VACCINES  Aged Out    Health Maintenance  Health Maintenance Due  Topic Date Due   COVID-19 Vaccine (2 - Moderna risk series) 02/04/2020   OPHTHALMOLOGY EXAM  01/24/2022    Colorectal cancer screening: No longer required.   Mammogram status: No longer required due to age.  Bone Density status: Ordered PT declined at this time. Pt provided with contact info and advised to call to  schedule appt.  Lung Cancer Screening: (Low Dose CT Chest recommended if Age 11-80 years, 20 pack-year currently smoking OR have quit w/in 15years.) does not qualify.   Lung Cancer Screening Referral:   Additional Screening:  Hepatitis C Screening: does qualify; Completed N/A  Vision Screening: Recommended annual ophthalmology exams for early detection of glaucoma and other disorders of the eye. Is the patient up to date with their annual eye exam?  Yes  Who is the provider or what is the name of the office in which the patient attends annual eye exams? Unable to remember If pt is not established with a provider, would they like to be referred to a provider to establish care? Yes .   Dental Screening: Recommended annual dental exams for proper oral hygiene  Diabetic Foot Exam: Diabetic Foot Exam: Completed 09/24/2023  Community Resource Referral / Chronic Care Management: CRR required this visit?  No   CCM required this visit?  No     Plan:     I have personally reviewed and noted the following in the patient's chart:   Medical and social history Use of alcohol, tobacco or illicit drugs  Current medications and supplements including opioid prescriptions.  Patient is not currently taking opioid prescriptions. Functional ability and status Nutritional status Physical activity Advanced directives List of other physicians Hospitalizations, surgeries, and ER visits in previous 12 months Vitals Screenings to include cognitive, depression, and falls Referrals and appointments  In addition, I have reviewed and discussed with patient certain preventive protocols, quality metrics, and best practice recommendations. A written personalized care plan for preventive services as well as general preventive health recommendations were provided to patient.     Park Meo, FNP   01/09/2024   After Visit Summary: (In Person-Printed) AVS printed and given to the patient  Nurse Notes:  n/a

## 2024-01-09 NOTE — Progress Notes (Signed)
 Subjective:  HPI: Gina [name] is a 78 y.o. female presenting on 01/09/2024 for No chief complaint on file.   HPI Patient is in today for AWV and BP follow up. Home machine reads 171/86 today in office while manual office reading is 128/78.  Home readings have been 129/74 - 204/101 (see media)  HYPERTENSION without Chronic Kidney Disease Hypertension status: controlled  Satisfied with current treatment? yes Duration of hypertension: chronic BP monitoring frequency:  a few times a day BP range:  BP medication side effects:  no Medication compliance: excellent compliance Previous BP meds: losartan Aspirin: yes Recurrent headaches: no Visual changes: no Palpitations: no Dyspnea: no Chest pain: no Lower extremity edema: no Dizzy/lightheaded: no   Review of Systems  All other systems reviewed and are negative.   Relevant past medical history reviewed and updated as indicated.   Past Medical History:  Diagnosis Date   Anxiety    new dx   Atrial fibrillation (HCC)    a. 03/2015 s/p RFCA;  b. CHA2DS2VASc = 5-->Eliquis.   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 12/2014 - due to AF RVR;  b. 03/2015 Echo: EF 60-65%, no rwma.   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   Current Outpatient Medications:    apixaban (ELIQUIS) 5 MG TABS tablet, Take 1 tablet (5 mg total) by mouth 2 (two) times daily., Disp: 180 tablet, Rfl: 2   Ascorbic Acid (VITAMIN C) 1000 MG tablet, Take 1,000 mg by mouth daily. , Disp: , Rfl:    ASHWAGANDHA PO, Take 380 mg by mouth daily., Disp: , Rfl:    aspirin EC 81 MG tablet, Take 81 mg by mouth daily. Swallow whole., Disp: , Rfl:    cetirizine (ZYRTEC ALLERGY) 10 MG tablet, Take 1 tablet (10 mg total) by mouth daily., Disp: 30 tablet, Rfl: 0   Cholecalciferol (VITAMIN D PO), Take 1 drop by mouth daily. vitamin d3 plus k2, Disp: , Rfl:    Coenzyme Q10 (COQ10) 200 MG CAPS, Take 200 mg by mouth daily. (Patient not taking: Reported on 01/09/2024), Disp: , Rfl:    Continuous Blood Gluc Sensor (DEXCOM G6 SENSOR) MISC, by Does not apply  route., Disp: , Rfl:    Continuous Blood Gluc Transmit (DEXCOM G6 TRANSMITTER) MISC, by Does not apply route., Disp: , Rfl:    Cyanocobalamin (VITAMIN B-12) 5000 MCG SUBL, Place 5,000 mcg under the tongue daily., Disp: , Rfl:    diltiazem (CARDIZEM) 30 MG tablet, Take 30 mg by mouth daily., Disp: , Rfl:    Ferrous Sulfate (IRON PO), Take by mouth., Disp: , Rfl:    glucose blood (ONE TOUCH ULTRA TEST) test strip, Use asto check blood sugar 5 times a day, Disp: 200 each, Rfl: 5   insulin aspart (NOVOLOG) 100 UNIT/ML injection, Inject into the skin., Disp: , Rfl:    insulin aspart (NOVOLOG) 100 UNIT/ML injection, USE MAX OF 60 UNITS DAILY VIA INSULIN PUMP., Disp: 50  mL, Rfl: 3   losartan (COZAAR) 25 MG tablet, TAKE 1 TABLET BY MOUTH EVERY DAY, Disp: 90 tablet, Rfl: 3   Misc Natural Products (GLUCOSAMINE CHOND MSM FORMULA PO), Take 15 mLs by mouth daily., Disp: , Rfl:    mometasone (ELOCON) 0.1 % ointment, Apply topically daily. (Patient not taking: Reported on 01/09/2024), Disp: 45 g, Rfl: 0   Multiple Vitamin (MULTIVITAMIN ADULT PO), Take 100 mg by mouth every morning. - Estrogen supplement, Disp: , Rfl:    Multiple Vitamin (MULTIVITAMIN ADULT PO), Take 1 capsule by mouth 2 (two) times daily. Neurovision- For eyes and immune Health, Disp: , Rfl:    Multiple Vitamin (MULTIVITAMIN PO), Take 12.5 mg by mouth daily. Iodoral- (Patient not taking: Reported on 01/09/2024), Disp: , Rfl:    Multiple Vitamin (MULTIVITAMIN PO), Take 50 mcg by mouth daily. Cholestar- Vitamin K27-, Disp: , Rfl:    Multiple Vitamin (MULTIVITAMIN) capsule, Glycemic Balance-Taking 2 capsules by mouth daily-, Disp: , Rfl:    NON FORMULARY, Take 15 mLs by mouth daily. MAG CAL VANILLA LIQUID, Disp: , Rfl:    NON FORMULARY, Take 2 capsules by mouth every morning. Keraflex-, Disp: , Rfl:    Omega-3 Fatty Acids (SUPER OMEGA-3 PO), Take 2 capsules by mouth daily., Disp: , Rfl:    RESVERATROL PO, Take 960 mg by mouth daily., Disp: , Rfl:    thyroid (ARMOUR THYROID) 15 MG tablet, TAKE 1 TABLET DAILY WITH THE 30 MG TABLET (TOTAL OF 45 MG DAILY), Disp: 90 tablet, Rfl: 3   thyroid (ARMOUR THYROID) 30 MG tablet, TAKE ONE TABLET DAILY ALONG WITH THE 15 MG TABLET. (TOTAL DOSAGE OF 45 MG DAILY), Disp: 90 tablet, Rfl: 1   Turmeric Curcumin 500 MG CAPS, Take 482 mg by mouth daily., Disp: , Rfl:  No current facility-administered medications for this visit.  Facility-Administered Medications Ordered in Other Visits:    topical emolient (BIAFINE) emulsion, , Topical, Daily, Lonie Peak, MD, Given at 05/28/12 1400  Allergies  Allergen Reactions   Rosuvastatin Calcium     Headaches, fatigue   Rosuvastatin  Calcium Other (See Comments)    Headaches, fatigue    Objective:   BP 128/78   Pulse 63   Temp (!) 97.5 F (36.4 C)   Ht 5\' 4"  (1.626 m)   Wt 149 lb 9.6 oz (67.9 kg)   SpO2 98%   BMI 25.68 kg/m      01/09/2024   11:17 AM 12/26/2023    4:15 PM 12/11/2023   10:34 AM  Vitals with BMI  Height 5\' 4"  5\' 4"    Weight 149 lbs 10 oz 151 lbs 6 oz   BMI 25.67 25.98   Systolic 128 118 952  Diastolic  78 60 60  Pulse 63 70      Physical Exam Vitals and nursing note reviewed.  Constitutional:      Appearance: Normal appearance. She is normal weight.  HENT:     Head: Normocephalic and atraumatic.  Cardiovascular:     Rate and Rhythm: Normal rate and regular rhythm.     Pulses: Normal pulses.     Heart sounds: Normal heart sounds.  Pulmonary:     Effort: Pulmonary effort is normal.     Breath sounds: Normal breath sounds.  Skin:    General: Skin is warm and dry.  Neurological:     General: No focal deficit present.     Mental Status: She is alert and oriented to person, place, and time. Mental status is at baseline.  Psychiatric:        Mood and Affect: Mood normal.        Behavior: Behavior normal.        Thought Content: Thought content normal.        Judgment: Judgment normal.     Assessment & Plan:  Encounter for Medicare annual wellness exam  Essential hypertension Assessment & Plan: Well controlled with office readings, home cuff is reading 50 points higher, advised to obtain a new cuff and return to office if readings sustain >130/80. Recommend heart healthy diet such as Mediterranean diet with whole grains, fruits, vegetable, fish, lean meats, nuts, and olive oil. Limit salt. Encouraged moderate walking, 3-5 times/week for 30-50 minutes each session. Aim for at least 150 minutes.week. Goal should be pace of 3 miles/hours, or walking 1.5 miles in 30 minutes. Avoid tobacco products. Avoid excess alcohol. Take medications as prescribed and bring medications and blood  pressure log with cuff to each office visit. Seek medical care for chest pain, palpitations, shortness of breath with exertion, dizziness/lightheadedness, vision changes, recurrent headaches, or swelling of extremities. Follow up in 5 months or sooner if needed.      Follow up plan: Return in 5 months (on 06/10/2024) for chronic follow-up with labs 1 week prior.  Park Meo, FNP

## 2024-01-09 NOTE — Assessment & Plan Note (Signed)
 Well controlled with office readings, home cuff is reading 50 points higher, advised to obtain a new cuff and return to office if readings sustain >130/80. Recommend heart healthy diet such as Mediterranean diet with whole grains, fruits, vegetable, fish, lean meats, nuts, and olive oil. Limit salt. Encouraged moderate walking, 3-5 times/week for 30-50 minutes each session. Aim for at least 150 minutes.week. Goal should be pace of 3 miles/hours, or walking 1.5 miles in 30 minutes. Avoid tobacco products. Avoid excess alcohol. Take medications as prescribed and bring medications and blood pressure log with cuff to each office visit. Seek medical care for chest pain, palpitations, shortness of breath with exertion, dizziness/lightheadedness, vision changes, recurrent headaches, or swelling of extremities. Follow up in 5 months or sooner if needed.

## 2024-01-09 NOTE — Patient Instructions (Signed)
  Ms. Los , Thank you for taking time to come for your Medicare Wellness Visit. I appreciate your ongoing commitment to your health goals. Please review the following plan we discussed and let me know if I can assist you in the future.   These are the goals we discussed:  Goals   None     This is a list of the screening recommended for you and due dates:  Health Maintenance  Topic Date Due   COVID-19 Vaccine (2 - Moderna risk series) 02/04/2020   Eye exam for diabetics  01/24/2022   Flu Shot  01/13/2024*   Zoster (Shingles) Vaccine (1 of 2) 03/09/2024*   Pneumonia Vaccine (1 of 2 - PCV) 12/10/2024*   DEXA scan (bone density measurement)  12/10/2024*   Hepatitis C Screening  12/10/2024*   Hemoglobin A1C  06/27/2024   Yearly kidney function blood test for diabetes  09/18/2024   Yearly kidney health urinalysis for diabetes  09/18/2024   Complete foot exam   09/23/2024   Medicare Annual Wellness Visit  01/08/2025   DTaP/Tdap/Td vaccine (2 - Td or Tdap) 12/10/2033   HPV Vaccine  Aged Out  *Topic was postponed. The date shown is not the original due date.

## 2024-01-24 DIAGNOSIS — E109 Type 1 diabetes mellitus without complications: Secondary | ICD-10-CM | POA: Diagnosis not present

## 2024-02-18 DIAGNOSIS — L270 Generalized skin eruption due to drugs and medicaments taken internally: Secondary | ICD-10-CM | POA: Diagnosis not present

## 2024-02-18 DIAGNOSIS — L2989 Other pruritus: Secondary | ICD-10-CM | POA: Diagnosis not present

## 2024-02-25 DIAGNOSIS — E109 Type 1 diabetes mellitus without complications: Secondary | ICD-10-CM | POA: Diagnosis not present

## 2024-03-11 LAB — CREATININE
Creatinine, POC: 0.6 mg/dL
EGFR (Non-African Amer.): 90.001

## 2024-03-11 LAB — MICROALBUMIN / CREATININE URINE RATIO: Microalb Creat Ratio: <30

## 2024-03-19 ENCOUNTER — Other Ambulatory Visit: Payer: Self-pay | Admitting: Endocrinology

## 2024-03-19 DIAGNOSIS — E7800 Pure hypercholesterolemia, unspecified: Secondary | ICD-10-CM

## 2024-03-26 DIAGNOSIS — E109 Type 1 diabetes mellitus without complications: Secondary | ICD-10-CM | POA: Diagnosis not present

## 2024-03-30 ENCOUNTER — Ambulatory Visit: Admitting: Endocrinology

## 2024-03-30 ENCOUNTER — Encounter: Payer: Self-pay | Admitting: Endocrinology

## 2024-03-30 ENCOUNTER — Ambulatory Visit: Payer: Self-pay | Admitting: Endocrinology

## 2024-03-30 VITALS — BP 124/70 | HR 83 | Ht 64.0 in | Wt 145.0 lb

## 2024-03-30 DIAGNOSIS — E871 Hypo-osmolality and hyponatremia: Secondary | ICD-10-CM | POA: Diagnosis not present

## 2024-03-30 DIAGNOSIS — E038 Other specified hypothyroidism: Secondary | ICD-10-CM | POA: Diagnosis not present

## 2024-03-30 DIAGNOSIS — E1065 Type 1 diabetes mellitus with hyperglycemia: Secondary | ICD-10-CM | POA: Diagnosis not present

## 2024-03-30 DIAGNOSIS — E7800 Pure hypercholesterolemia, unspecified: Secondary | ICD-10-CM

## 2024-03-30 LAB — POCT GLYCOSYLATED HEMOGLOBIN (HGB A1C): Hemoglobin A1C: 7 % — AB (ref 4.0–5.6)

## 2024-03-30 NOTE — Progress Notes (Signed)
 Outpatient Endocrinology Note [place] Dahmir Epperly, MD  03/30/24  Patient's Name: Gina [name]    DOB: May 28, 1946    MRN: [id number]                                                    REASON OF VISIT: Follow up for type 1 diabetes mellitus  PCP: Jenelle Mis, FNP  HISTORY OF PRESENT ILLNESS:   Gina [name] is a 78 y.o. old female with past medical history listed below, is here for follow up for type 1 diabetes mellitus.   Pertinent Diabetes History: Patient was diagnosed with type 1 diabetes mellitus in 1984.    Chronic Diabetes Complications : Retinopathy: no. Last ophthalmology exam was done on annually, following with ophthalmology regularly.  Nephropathy: no, on losartan . Peripheral neuropathy: no Coronary artery disease: no Stroke: no  Relevant comorbidities and cardiovascular risk factors: Obesity: no Body mass index is 24.89 kg/m.  Hypertension: Yes   Hyperlipidemia : Yes, not on statin. Patient declined.  HYPERCHOLESTEROLEMIA: Her LDL has been persistently high and refuses to take a statin drug including pravastatin  even though it had no side effects previously. Previously her LDL particle number was high at 1621. She was given a prescription for Crestor  which she tried for 3-4 weeks but did not continue because she felt it was giving her headaches and making her tired. She prefers to use natural remedies but these have not been very effective.   She is taking Zetia  regularly as prescribed.  She had been recommended Leqvio which she agreed to try but because of high out-of-pocket expense and no patient assistance available she is not taking this   Triglyceride is consistently normal Usually has a good diet   Current / Home Diabetic regimen includes:  Tandem t:slim insulin  pump on control IQ using NovoLog  U100.  Insulin  Pump setting:  Basal MN- 0.300u/hour 2AM- 0.450  4AM- 1.10 6AM- 0.90 8AM-    0.900 11:30AM1.00 2PM-     1.050 6PM-     0.700 10PM-    0.300  Bolus CHO Ratio (1unit:CHO) MN- 1:8 2PM- 1:7 6PM- 1:8  Correction/Sensitivity: MN- 1:50 2PM- 1:40 10PM- 1:50  Target: 100   Active insulin  time: 5 hours  Prior diabetic medications:   CONTINUOUS GLUCOSE MONITORING SYSTEM (CGMS) / INSULIN  PUMP INTERPRETATION:                         Tandem Pump & Sensor Download (Reviewed and summarized below.) Pump: Dexcom G7 and Tandem t:slim Dates: June 3 - June 16 , 2025, 14 days Glucose Management Indicator: n/a% Time in range :77%        Average total daily insulin : 38 units, Basal: 56%, Bolus: 44%,  (Manual Bolus: 78%,  Control IQ Bolus: 22%)    Trends:  Frequent hyperglycemia usually in the low 200s and sometimes up to 250-300 range postprandially related to not enough meal bolus.  She corrected with manual bolus for postprandial hyperglycemia.  Some of the times she misses to give meal bolus before eating.  Blood sugar overnight and in between the meals are acceptable.  No hypoglycemia.  Hypoglycemia: Patient has no hypoglycemic episodes. Patient has hypoglycemia awareness.    Factors modifying glucose control: 1.  Diabetic diet assessment: 3 meals a day.  2.  Staying active or  exercising:  Working on her farm, housework and other activities.  3.  Medication compliance: compliant all of the time.  # Primary hypothyroidism : -Patient has been taking Armour Thyroid  which she prefers 45 mg daily.  # 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 been usually mild, longstanding intermittent, not on any diuretics or SSRIs. Urine osmolality as of 04/2015 was 618. She was told to cut back on her fluid intake but she is not doing so and she says she is trying to drink more water since she thought this was healthy. Sodium is variable but generally between 129-135. Has not had any symptoms from hyponatremia   Interval history  Pump and CGM data as reviewed above.  She ran out of the Dexcom G7 sensor from Saturday and asked for the replacement.   Provided 1 sample of Dexcom G7 in the clinic today.  She has random hyperglycemia.  She reports her carb count whenever she eats outside may not be accurate leading to hyperglycemia.  Patient reports she gets rash below the breast occasionally and had seen dermatology, had biopsy and told that may be related to the drug reaction.  She has not been on any new medication.  Discussed that if that become a problem related to the medication consider seeing allergist.  Denies palpitation or heat intolerance.  She has been taking Armour Thyroid  45 mg daily.  No other complaints today.  REVIEW OF SYSTEMS As per history of present illness.   PAST MEDICAL HISTORY: Past Medical History:  Diagnosis Date   Anxiety    new dx   Atrial fibrillation (HCC)    a. 03/2015 s/p RFCA;  b. CHA2DS2VASc = 5-->Eliquis .   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 12/2014 - due to AF RVR;  b. 03/2015 Echo: EF 60-65%, no rwma.   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 Node Positive for Mets.: ER/PR POs., Her 2 Neu negative, Ki-67 12%   COLONOSCOPY     1 polyp    ELECTROPHYSIOLOGIC STUDY N/A 04/08/2015   PVI + CTI ablation + posterior Box lesion + Atach ablation by Dr Nunzio Belch   OVARIAN CYST SURGERY     TEE WITHOUT CARDIOVERSION N/A 04/07/2015    Procedure: TRANSESOPHAGEAL ECHOCARDIOGRAM (TEE);  Surgeon: Lake Pilgrim, MD;  Location: [facility] ENDOSCOPY;  Service: Cardiovascular;  Laterality: N/A;   TONSILLECTOMY      ALLERGIES: Allergies  Allergen Reactions   Rosuvastatin  Calcium      Headaches, fatigue   Rosuvastatin  Calcium  Other (See Comments)    Headaches, fatigue    FAMILY HISTORY:  Family History  Problem Relation Age of Onset   Breast cancer Mother 72   Heart failure Mother    Prostate cancer Maternal Uncle  died in his 80s   Heart attack Father    Diabetes Maternal Uncle 15    SOCIAL HISTORY: Social History   Socioeconomic History   Marital status: Married    Spouse name: Not on file   Number of children: Not on file   Years of education: Not on file   Highest education level: Not on file  Occupational History   Not on file  Tobacco Use   Smoking status: Never   Smokeless tobacco: Never  Vaping Use   Vaping status: Never Used  Substance and Sexual Activity   Alcohol use: No   Drug use: No   Sexual activity: Yes  Other Topics Concern   Not on file  Social History Narrative   Not on file   Social Drivers of Health   Financial Resource Strain: Low Risk  (01/09/2024)   Overall Financial Resource Strain (CARDIA)    Difficulty of Paying Living Expenses: Not hard at all  Food Insecurity: No Food Insecurity (01/09/2024)   Hunger Vital Sign    Worried About Running Out of Food in the Last Year: Never true    Ran Out of Food in the Last Year: Never true  Transportation Needs: No Transportation Needs (01/09/2024)   PRAPARE - Administrator, Civil Service (Medical): No    Lack of Transportation (Non-Medical): No  Physical Activity: Insufficiently Active (01/09/2024)   Exercise Vital Sign    Days of Exercise per Week: 3 days    Minutes of Exercise per Session: 30 min  Stress: No Stress Concern Present (01/09/2024)   [employer] of Occupational Health - Occupational Stress  Questionnaire    Feeling of Stress : Not at all  Social Connections: Socially Integrated (01/09/2024)   Social Connection and Isolation Panel    Frequency of Communication with Friends and Family: More than three times a week    Frequency of Social Gatherings with Friends and Family: Twice a week    Attends Religious Services: More than 4 times per year    Active Member of [employer] or Organizations: Yes    Attends Engineer, structural: More than 4 times per year    Marital Status: Married    MEDICATIONS:  Current Outpatient Medications  Medication Sig Dispense Refill   apixaban  (ELIQUIS ) 5 MG TABS tablet Take 1 tablet (5 mg total) by mouth 2 (two) times daily. 180 tablet 2   aspirin  EC 81 MG tablet Take 81 mg by mouth daily. Swallow whole.     Continuous Glucose Sensor (DEXCOM G7 SENSOR) MISC by Does not apply route.     ezetimibe  (ZETIA ) 10 MG tablet Take 10 mg by mouth daily.     insulin  aspart (NOVOLOG ) 100 UNIT/ML injection Inject into the skin.     insulin  aspart (NOVOLOG ) 100 UNIT/ML injection USE MAX OF 60 UNITS DAILY VIA INSULIN  PUMP. 50 mL 3   losartan  (COZAAR ) 25 MG tablet TAKE 1 TABLET BY MOUTH EVERY DAY 90 tablet 3   thyroid  (ARMOUR THYROID ) 15 MG tablet TAKE 1 TABLET DAILY WITH THE 30 MG TABLET (TOTAL OF 45 MG DAILY) 90 tablet 3   thyroid  (ARMOUR THYROID ) 30 MG tablet TAKE ONE TABLET DAILY ALONG WITH THE 15 MG TABLET. (TOTAL DOSAGE OF 45 MG DAILY) 90 tablet 1   Ascorbic Acid  (VITAMIN C ) 1000 MG tablet Take 1,000 mg by mouth daily.  (Patient not taking: Reported on 03/30/2024)     ASHWAGANDHA PO Take 380 mg by mouth daily. (  Patient not taking: Reported on 03/30/2024)     cetirizine  (ZYRTEC  ALLERGY) 10 MG tablet Take 1 tablet (10 mg total) by mouth daily. (Patient not taking: Reported on 03/30/2024) 30 tablet 0   Cholecalciferol  (VITAMIN D  PO) Take 1 drop by mouth daily. vitamin d3 plus k2 (Patient not taking: Reported on 03/30/2024)     Coenzyme Q10 (COQ10) 200 MG CAPS Take  200 mg by mouth daily. (Patient not taking: Reported on 01/09/2024)     Continuous Blood Gluc Sensor (DEXCOM G6 SENSOR) MISC by Does not apply route. (Patient not taking: Reported on 03/30/2024)     Continuous Blood Gluc Transmit (DEXCOM G6 TRANSMITTER) MISC by Does not apply route. (Patient not taking: Reported on 03/30/2024)     Cyanocobalamin  (VITAMIN B-12) 5000 MCG SUBL Place 5,000 mcg under the tongue daily. (Patient not taking: Reported on 03/30/2024)     diltiazem  (CARDIZEM ) 30 MG tablet Take 30 mg by mouth daily.     Ferrous Sulfate  (IRON PO) Take by mouth.     glucose blood (ONE TOUCH ULTRA TEST) test strip Use asto check blood sugar 5 times a day 200 each 5   Misc Natural Products (GLUCOSAMINE CHOND MSM FORMULA PO) Take 15 mLs by mouth daily.     mometasone  (ELOCON ) 0.1 % ointment Apply topically daily. (Patient not taking: Reported on 01/09/2024) 45 g 0   Multiple Vitamin (MULTIVITAMIN ADULT PO) Take 100 mg by mouth every morning. - Estrogen supplement     Multiple Vitamin (MULTIVITAMIN ADULT PO) Take 1 capsule by mouth 2 (two) times daily. Neurovision- For eyes and immune Health     Multiple Vitamin (MULTIVITAMIN PO) Take 12.5 mg by mouth daily. Iodoral- (Patient not taking: Reported on 01/09/2024)     Multiple Vitamin (MULTIVITAMIN PO) Take 50 mcg by mouth daily. Cholestar- Vitamin K27-     Multiple Vitamin (MULTIVITAMIN) capsule Glycemic Balance-Taking 2 capsules by mouth daily-     NON FORMULARY Take 15 mLs by mouth daily. MAG CAL VANILLA LIQUID (Patient not taking: Reported on 03/30/2024)     NON FORMULARY Take 2 capsules by mouth every morning. Keraflex-     Omega-3 Fatty Acids (SUPER OMEGA-3 PO) Take 2 capsules by mouth daily.     RESVERATROL PO Take 960 mg by mouth daily.     Turmeric Curcumin 500 MG CAPS Take 482 mg by mouth daily.     No current facility-administered medications for this visit.   Facility-Administered Medications Ordered in Other Visits  Medication Dose Route  Frequency Provider Last Rate Last Admin   topical emolient (BIAFINE) emulsion   Topical Daily Colie Dawes, MD   Given at 05/28/12 1400    PHYSICAL EXAM: Vitals:   03/30/24 0954  BP: 124/70  Pulse: 83  SpO2: 98%  Weight: 145 lb (65.8 kg)  Height: 5' 4 (1.626 m)     Body mass index is 24.89 kg/m.  Wt Readings from Last 3 Encounters:  03/30/24 145 lb (65.8 kg)  01/09/24 149 lb 9.6 oz (67.9 kg)  12/26/23 151 lb 6.4 oz (68.7 kg)    General: Well developed, well nourished female in no apparent distress.  HEENT: AT/[place], no external lesions.  Eyes: Conjunctiva clear and no icterus. Neck: Neck supple  Lungs: Respirations not labored Neurologic: Alert, oriented, normal speech Extremities / Skin: Dry.  Psychiatric: Does not appear depressed or anxious  Diabetic Foot Exam - Simple   No data filed    LABS Reviewed Lab Results  Component Value Date  HGBA1C 7.0 (A) 03/30/2024   HGBA1C 6.3 (A) 12/26/2023   HGBA1C 7.6 (H) 09/19/2023   No results found for: FRUCTOSAMINE Lab Results  Component Value Date   CHOL 238 (H) 09/19/2023   HDL 78 09/19/2023   LDLCALC 144 (H) 09/19/2023   LDLDIRECT 90.0 05/28/2022   TRIG 61 09/19/2023   CHOLHDL 3.1 09/19/2023   Lab Results  Component Value Date   MICRALBCREAT 5 09/19/2023   MICRALBCREAT 2.0 09/18/2022   Lab Results  Component Value Date   CREATININE 0.72 09/19/2023   Lab Results  Component Value Date   GFR 62.73 05/21/2023    ASSESSMENT / PLAN  1. Uncontrolled type 1 diabetes mellitus with hyperglycemia (HCC)   2. Adult onset hypothyroidism   3. Pure hypercholesterolemia   4. Hyponatremia     Diabetes Mellitus type 1, complicated by no other  known complications.  - Diabetic status / severity: fair controlled.   Lab Results  Component Value Date   HGBA1C 7.0 (A) 03/30/2024    - Hemoglobin A1c goal <7%. Random postprandial hyperglycemia.  - Medications:  Insulin  pump setting changed as follows.  Mainly  changed to correction factor due to hyperglycemia.  Advised to bolus for all the meals and preferably 10 to 15 minutes before eating.  Tandem t:slim insulin  pump on control IQ using NovoLog  U100.  Insulin  Pump setting:  Basal MN- 0.300u/hour 2AM- 0.450  4AM- 1.10 6AM- 0.90 8AM-    0.900 11:30AM1.00 2PM-     1.050 6PM-     0.700 10PM-   0.300  Bolus CHO Ratio (1unit:CHO) MN- 1:8 2PM- 1:7 6PM- 1:8  Correction/Sensitivity: MN- 1:50, changed to 1:45. 2PM- 1:40 10PM- 1:50, changed to 1 : 45.  Target: 100   Active insulin  time: 5 hours  - Home glucose testing: continue CGM and check blood glucose as needed.  - Discussed/ Gave Hypoglycemia treatment plan.  # Consult : not required at this time.   # Annual urine for microalbuminuria/ creatinine ratio, no microalbuminuria currently. Last  Lab Results  Component Value Date   MICRALBCREAT 5 09/19/2023    # Foot check nightly.  # Annual dilated diabetic eye exams.   - Diet: Make healthy diabetic food choices - Life style / activity / exercise: discussed.   2. Blood pressure  -  BP Readings from Last 1 Encounters:  03/30/24 124/70    - Control is in target.  - No change in current plans.  3. Lipid status / Hyperlipidemia - Last  Lab Results  Component Value Date   LDLCALC 144 (H) 09/19/2023   - Continue Zetia  10 mg daily.  Patient declined statin therapy.  Consider oral Livalo. - Check lipid panel today.  Patient is not fasting.  # Primary hypothyroidism -Continue Armour Thyroid  45 mg daily. - Check thyroid  function test TSH, free T4, free T3.  # Hyponatremia ?  SIADH -This is mild and asymptomatic.  Patient serum sodium 135.  Will check serum sodium periodically.  Advised for limitation of fluid/water intake. - Will check BMP with EGFR today.   Diagnoses and all orders for this visit:  Uncontrolled type 1 diabetes mellitus with hyperglycemia (HCC) -     POCT glycosylated hemoglobin (Hb A1C)  Adult  onset hypothyroidism -     T4, free -     TSH -     T3, free  Pure hypercholesterolemia -     Lipid panel  Hyponatremia -     Basic metabolic panel with  GFR    DISPOSITION Follow up in clinic in 3  months suggested.   All questions answered and patient verbalized understanding of the plan.  [place] Benjamyn Hestand, MD [facility] Endocrin[facility] [address] [place], [address] [place], [place] [address] Phone # [phone]  At least part of this note was generated using voice recognition software. Inadvertent word errors may have occurred, which were not recognized during the proofreading process.

## 2024-03-31 LAB — T4, FREE: Free T4: 1.5 ng/dL (ref 0.8–1.8)

## 2024-03-31 LAB — TSH: TSH: 0.12 m[IU]/L — ABNORMAL LOW (ref 0.40–4.50)

## 2024-03-31 LAB — BASIC METABOLIC PANEL WITH GFR
BUN: 14 mg/dL (ref 7–25)
CO2: 27 mmol/L (ref 20–32)
Calcium: 9.1 mg/dL (ref 8.6–10.4)
Chloride: 96 mmol/L — ABNORMAL LOW (ref 98–110)
Creat: 0.75 mg/dL (ref 0.60–1.00)
Glucose, Bld: 168 mg/dL — ABNORMAL HIGH (ref 65–139)
Potassium: 4.9 mmol/L (ref 3.5–5.3)
Sodium: 133 mmol/L — ABNORMAL LOW (ref 135–146)
eGFR: 81 mL/min/{1.73_m2} (ref >=60)

## 2024-03-31 LAB — LIPID PANEL
Cholesterol: 225 mg/dL — ABNORMAL HIGH (ref <200)
HDL: 82 mg/dL (ref >=50)
LDL Cholesterol (Calc): 128 mg/dL — ABNORMAL HIGH
Non-HDL Cholesterol (Calc): 143 mg/dL — ABNORMAL HIGH (ref <130)
Total CHOL/HDL Ratio: 2.7 (calc) (ref <5.0)
Triglycerides: 60 mg/dL (ref <150)

## 2024-03-31 LAB — HEMOGLOBIN A1C
Hgb A1c MFr Bld: 7.7 % — ABNORMAL HIGH (ref <5.7)
Mean Plasma Glucose: 174 mg/dL
eAG (mmol/L): 9.7 mmol/L

## 2024-03-31 LAB — T3, FREE: T3, Free: 3.9 pg/mL (ref 2.3–4.2)

## 2024-04-01 ENCOUNTER — Encounter: Payer: Self-pay | Admitting: Endocrinology

## 2024-04-06 DIAGNOSIS — L508 Other urticaria: Secondary | ICD-10-CM | POA: Diagnosis not present

## 2024-04-08 ENCOUNTER — Other Ambulatory Visit (HOSPITAL_COMMUNITY): Payer: Self-pay | Admitting: Internal Medicine

## 2024-04-08 DIAGNOSIS — I4819 Other persistent atrial fibrillation: Secondary | ICD-10-CM

## 2024-04-20 DIAGNOSIS — L508 Other urticaria: Secondary | ICD-10-CM | POA: Diagnosis not present

## 2024-04-24 DIAGNOSIS — L508 Other urticaria: Secondary | ICD-10-CM | POA: Diagnosis not present

## 2024-04-27 DIAGNOSIS — E109 Type 1 diabetes mellitus without complications: Secondary | ICD-10-CM | POA: Diagnosis not present

## 2024-05-06 DIAGNOSIS — L508 Other urticaria: Secondary | ICD-10-CM | POA: Diagnosis not present

## 2024-05-20 DIAGNOSIS — L508 Other urticaria: Secondary | ICD-10-CM | POA: Diagnosis not present

## 2024-05-26 DIAGNOSIS — E109 Type 1 diabetes mellitus without complications: Secondary | ICD-10-CM | POA: Diagnosis not present

## 2024-05-29 DIAGNOSIS — E1065 Type 1 diabetes mellitus with hyperglycemia: Secondary | ICD-10-CM | POA: Diagnosis not present

## 2024-05-29 DIAGNOSIS — E109 Type 1 diabetes mellitus without complications: Secondary | ICD-10-CM | POA: Diagnosis not present

## 2024-06-03 ENCOUNTER — Encounter: Attending: Endocrinology | Admitting: Nutrition

## 2024-06-03 DIAGNOSIS — L508 Other urticaria: Secondary | ICD-10-CM | POA: Diagnosis not present

## 2024-06-03 DIAGNOSIS — E1065 Type 1 diabetes mellitus with hyperglycemia: Secondary | ICD-10-CM | POA: Insufficient documentation

## 2024-06-03 NOTE — Progress Notes (Signed)
 Patient is here with her husband to start the new insulin  pump.  Settings were transferred to her new pump from the last office note on Dr. Eugenio record on 03/30/24. This was also the settings that were in her old pump.  Sensor data was also put into the new pump.  Control IQ was turned on but her pump was not linked to tandem Source.  This will be don when she switches pumps.  We reviewed how to do this and she had no questions.  Pt. Will start the new pump when the cartridge empties on her old pump, in 2 days. They had no final questions.

## 2024-06-11 DIAGNOSIS — E109 Type 1 diabetes mellitus without complications: Secondary | ICD-10-CM | POA: Diagnosis not present

## 2024-06-17 DIAGNOSIS — L508 Other urticaria: Secondary | ICD-10-CM | POA: Diagnosis not present

## 2024-06-29 DIAGNOSIS — E1065 Type 1 diabetes mellitus with hyperglycemia: Secondary | ICD-10-CM | POA: Diagnosis not present

## 2024-07-01 DIAGNOSIS — L508 Other urticaria: Secondary | ICD-10-CM | POA: Diagnosis not present

## 2024-07-02 DIAGNOSIS — E119 Type 2 diabetes mellitus without complications: Secondary | ICD-10-CM | POA: Diagnosis not present

## 2024-07-02 DIAGNOSIS — H349 Unspecified retinal vascular occlusion: Secondary | ICD-10-CM | POA: Diagnosis not present

## 2024-07-02 LAB — HM DIABETES EYE EXAM

## 2024-07-08 ENCOUNTER — Ambulatory Visit: Payer: Self-pay | Admitting: Endocrinology

## 2024-07-08 ENCOUNTER — Encounter: Payer: Self-pay | Admitting: Endocrinology

## 2024-07-08 ENCOUNTER — Ambulatory Visit (INDEPENDENT_AMBULATORY_CARE_PROVIDER_SITE_OTHER): Admitting: Endocrinology

## 2024-07-08 VITALS — BP 142/72 | HR 75 | Resp 20 | Ht 64.0 in | Wt 145.4 lb

## 2024-07-08 DIAGNOSIS — E1065 Type 1 diabetes mellitus with hyperglycemia: Secondary | ICD-10-CM | POA: Diagnosis not present

## 2024-07-08 DIAGNOSIS — E038 Other specified hypothyroidism: Secondary | ICD-10-CM

## 2024-07-08 DIAGNOSIS — E871 Hypo-osmolality and hyponatremia: Secondary | ICD-10-CM

## 2024-07-08 LAB — TSH: TSH: 0.51 m[IU]/L (ref 0.40–4.50)

## 2024-07-08 LAB — BASIC METABOLIC PANEL WITH GFR
BUN: 13 mg/dL (ref 7–25)
CO2: 29 mmol/L (ref 20–32)
Calcium: 8.8 mg/dL (ref 8.6–10.4)
Chloride: 98 mmol/L (ref 98–110)
Creat: 0.68 mg/dL (ref 0.60–0.95)
Glucose, Bld: 158 mg/dL — ABNORMAL HIGH (ref 65–99)
Potassium: 4.3 mmol/L (ref 3.5–5.3)
Sodium: 134 mmol/L — ABNORMAL LOW (ref 135–146)
eGFR: 88 mL/min/1.73m2 (ref >=60)

## 2024-07-08 LAB — T4, FREE: Free T4: 1.2 ng/dL (ref 0.8–1.8)

## 2024-07-08 LAB — POCT GLYCOSYLATED HEMOGLOBIN (HGB A1C): Hemoglobin A1C: 6.8 % — AB (ref 4.0–5.6)

## 2024-07-08 LAB — T3, FREE: T3, Free: 3.9 pg/mL (ref 2.3–4.2)

## 2024-07-08 NOTE — Progress Notes (Signed)
 Outpatient Endocrinology Note [place] Ziad Maye, MD  07/08/24  Patient's Name: Gina [name]    DOB: [birth year]    MRN: [id number]                                                    REASON OF VISIT: Follow up for type 1 diabetes mellitus  PCP: Kayla Jeoffrey RAMAN, FNP  HISTORY OF PRESENT ILLNESS:   Gina [name] is a 78 y.o. old female with past medical history listed below, is here for follow up for type 1 diabetes mellitus.   Pertinent Diabetes History: Patient was diagnosed with type 1 diabetes mellitus in 1984.    Chronic Diabetes Complications : Retinopathy: no. Last ophthalmology exam was done on annually, following with ophthalmology regularly.  Nephropathy: no, on losartan . Peripheral neuropathy: no Coronary artery disease: no Stroke: no  Relevant comorbidities and cardiovascular risk factors: Obesity: no Body mass index is 24.96 kg/m.  Hypertension: Yes   Hyperlipidemia : Yes, not on statin. Patient declined.  HYPERCHOLESTEROLEMIA: Her LDL has been persistently high and refuses to take a statin drug including pravastatin  even though it had no side effects previously. Previously her LDL particle number was high at 1621. She was given a prescription for Crestor  which she tried for 3-4 weeks but did not continue because she felt it was giving her headaches and making her tired. She prefers to use natural remedies but these have not been very effective.   She is taking Zetia  regularly as prescribed.  She had been recommended Leqvio which she agreed to try but because of high out-of-pocket expense and no patient assistance available she is not taking this   Triglyceride is consistently normal Usually has a good diet   Current / Home Diabetic regimen includes:  Tandem t:slim insulin  pump on control IQ using NovoLog  U100.  Insulin  Pump setting:  Basal MN- 0.300u/hour 2AM- 0.450  4AM- 1.10 6AM- 0.90 8AM-    0.900 11:30AM1.00 2PM-     1.050 6PM-     0.700 10PM-    0.300  Bolus CHO Ratio (1unit:CHO) MN- 1:8 2PM- 1:7 6PM- 1:8  Correction/Sensitivity: MN- 1:45 2PM- 1:40 10PM- 1:45  Target: 110   Active insulin  time: 5 hours  Prior diabetic medications:   CONTINUOUS GLUCOSE MONITORING SYSTEM (CGMS) / INSULIN  PUMP INTERPRETATION:                         Tandem Pump & Sensor Download (Reviewed and summarized below.) Pump: Dexcom G7 and Tandem t:slim Dates: September 11 to July 08, 2024, 14 days Glucose Management Indicator:6.8% Time in range :83%         Average total daily insulin : 34 units, Basal: 48%, Bolus: 52%,  (Manual Bolus: 63%,  Control IQ Bolus: 37%)    Trends:  Mostly acceptable blood sugar with occasional mild hyperglycemia with blood sugar in low 200s.  Rare mild hypoglycemia related to insulin  stacking.  No concerning hypo or hyperglycemia.  Hypoglycemia: Patient has no hypoglycemic episodes. Patient has hypoglycemia awareness.    Factors modifying glucose control: 1.  Diabetic diet assessment: 3 meals a day.  2.  Staying active or exercising:  Working on her farm, housework and other activities.  3.  Medication compliance: compliant all of the time.  # Primary hypothyroidism : -Patient has been taking  Armour Thyroid  which she prefers 45 mg daily.  # 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 been usually mild, longstanding intermittent, not on any diuretics or SSRIs. Urine osmolality as of 04/2015 was 618. She was told to cut back on her fluid intake but she is not doing so and she says she is trying to drink more water since she thought this was healthy. Sodium is variable but generally between 129-135. Has not had any symptoms from hyponatremia   Interval history  Insulin  pump and CGM data as reviewed above.  Hemoglobin A1c improved to 6.8% today.  She denies palpitation and heat intolerance.  She has been taking Armour Thyroid .  No other complaints today.  REVIEW OF SYSTEMS As per history of present illness.   PAST  MEDICAL HISTORY: Past Medical History:  Diagnosis Date   Anxiety    new dx   Atrial fibrillation (HCC)    a. 03/2015 s/p RFCA;  b. CHA2DS2VASc = 5-->Eliquis .   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 12/2014 - due to AF RVR;  b. 03/2015 Echo: EF 60-65%, no rwma.   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 Node Positive for Mets.: ER/PR POs., Her 2 Neu negative, Ki-67 12%   COLONOSCOPY     1 polyp    ELECTROPHYSIOLOGIC STUDY N/A 04/08/2015   PVI + CTI ablation + posterior Box lesion + Atach ablation by Dr Kelsie   OVARIAN CYST SURGERY     TEE WITHOUT CARDIOVERSION N/A 04/07/2015   Procedure: TRANSESOPHAGEAL ECHOCARDIOGRAM (TEE);  Surgeon: Aleene JINNY Passe, MD;  Location: [facility] ENDOSCOPY;  Service: Cardiovascular;  Laterality: N/A;   TONSILLECTOMY      ALLERGIES: Allergies  Allergen Reactions   Rosuvastatin  Calcium      Headaches, fatigue   Rosuvastatin  Calcium  Other (See Comments)    Headaches, fatigue    FAMILY HISTORY:  Family History  Problem Relation Age of Onset   Breast cancer Mother 61   Heart failure Mother    Prostate cancer Maternal Uncle        died in his 82s   Heart attack Father    Diabetes Maternal Uncle 15    SOCIAL HISTORY: Social History   Socioeconomic History   Marital status: Married     Spouse name: Not on file   Number of children: Not on file   Years of education: Not on file   Highest education level: Not on file  Occupational History   Not on file  Tobacco Use   Smoking status: Never   Smokeless tobacco: Never  Vaping Use   Vaping status: Never Used  Substance and Sexual Activity   Alcohol use: No   Drug use: No   Sexual activity: Yes  Other Topics Concern  Not on file  Social History Narrative   Not on file   Social Drivers of Health   Financial Resource Strain: Low Risk  (01/09/2024)   Overall Financial Resource Strain (CARDIA)    Difficulty of Paying Living Expenses: Not hard at all  Food Insecurity: No Food Insecurity (01/09/2024)   Hunger Vital Sign    Worried About Running Out of Food in the Last Year: Never true    Ran Out of Food in the Last Year: Never true  Transportation Needs: No Transportation Needs (01/09/2024)   PRAPARE - Administrator, Civil Service (Medical): No    Lack of Transportation (Non-Medical): No  Physical Activity: Insufficiently Active (01/09/2024)   Exercise Vital Sign    Days of Exercise per Week: 3 days    Minutes of Exercise per Session: 30 min  Stress: No Stress Concern Present (01/09/2024)   [employer] of Occupational Health - Occupational Stress Questionnaire    Feeling of Stress : Not at all  Social Connections: Socially Integrated (01/09/2024)   Social Connection and Isolation Panel    Frequency of Communication with Friends and Family: More than three times a week    Frequency of Social Gatherings with Friends and Family: Twice a week    Attends Religious Services: More than 4 times per year    Active Member of [employer] or Organizations: Yes    Attends Engineer, structural: More than 4 times per year    Marital Status: Married    MEDICATIONS:  Current Outpatient Medications  Medication Sig Dispense Refill   aspirin  EC 81 MG tablet Take 81 mg by mouth daily. Swallow whole.      Continuous Glucose Sensor (DEXCOM G7 SENSOR) MISC by Does not apply route.     ELIQUIS  5 MG TABS tablet TAKE ONE TABLET (5 MG TOTAL) BY MOUTH TWO (TWO) TIMES DAILY. 180 tablet 2   ezetimibe  (ZETIA ) 10 MG tablet Take 10 mg by mouth daily.     glucose blood (ONE TOUCH ULTRA TEST) test strip Use asto check blood sugar 5 times a day 200 each 5   insulin  aspart (NOVOLOG ) 100 UNIT/ML injection Inject into the skin.     insulin  aspart (NOVOLOG ) 100 UNIT/ML injection USE MAX OF 60 UNITS DAILY VIA INSULIN  PUMP. 50 mL 3   losartan  (COZAAR ) 25 MG tablet TAKE 1 TABLET BY MOUTH EVERY DAY 90 tablet 3   Misc Natural Products (GLUCOSAMINE CHOND MSM FORMULA PO) Take 15 mLs by mouth daily.     thyroid  (ARMOUR THYROID ) 15 MG tablet TAKE 1 TABLET DAILY WITH THE 30 MG TABLET (TOTAL OF 45 MG DAILY) 90 tablet 3   thyroid  (ARMOUR THYROID ) 30 MG tablet TAKE ONE TABLET DAILY ALONG WITH THE 15 MG TABLET. (TOTAL DOSAGE OF 45 MG DAILY) 90 tablet 1   No current facility-administered medications for this visit.   Facility-Administered Medications Ordered in Other Visits  Medication Dose Route Frequency Provider Last Rate Last Admin   topical emolient (BIAFINE) emulsion   Topical Daily Izell Domino, MD   Given at 05/28/12 1400    PHYSICAL EXAM: Vitals:   07/08/24 1044 07/08/24 1045  BP: (!) 142/70 (!) 142/72  Pulse: 75   Resp: 20   SpO2: 98%   Weight: 145 lb 6.4 oz (66 kg)   Height: 5' 4 (1.626 m)       Body mass index is 24.96 kg/m.  Wt Readings from Last 3 Encounters:  07/08/24 145 lb 6.4  oz (66 kg)  03/30/24 145 lb (65.8 kg)  01/09/24 149 lb 9.6 oz (67.9 kg)    General: Well developed, well nourished female in no apparent distress.  HEENT: AT/[place], no external lesions.  Eyes: Conjunctiva clear and no icterus. Neck: Neck supple  Lungs: Respirations not labored Neurologic: Alert, oriented, normal speech Extremities / Skin: Dry.  Psychiatric: Does not appear depressed or anxious  Diabetic Foot Exam -  Simple   No data filed    LABS Reviewed Lab Results  Component Value Date   HGBA1C 6.8 (A) 07/08/2024   HGBA1C 7.7 (H) 03/30/2024   HGBA1C 7.0 (A) 03/30/2024   No results found for: FRUCTOSAMINE Lab Results  Component Value Date   CHOL 225 (H) 03/30/2024   HDL 82 03/30/2024   LDLCALC 128 (H) 03/30/2024   LDLDIRECT 90.0 05/28/2022   TRIG 60 03/30/2024   CHOLHDL 2.7 03/30/2024   Lab Results  Component Value Date   MICRALBCREAT <30 03/11/2024   MICRALBCREAT 5 09/19/2023   Lab Results  Component Value Date   CREATININE 0.75 03/30/2024   Lab Results  Component Value Date   GFR 62.73 05/21/2023    ASSESSMENT / PLAN  1. Uncontrolled type 1 diabetes mellitus with hyperglycemia (HCC)   2. Adult onset hypothyroidism   3. Hyponatremia      Diabetes Mellitus type 1, complicated by no other  known complications.  - Diabetic status / severity: fair controlled.   Lab Results  Component Value Date   HGBA1C 6.8 (A) 07/08/2024    - Hemoglobin A1c goal <7%. Random postprandial mild hyperglycemia.  - Medications:   Tandem t:slim insulin  pump on control IQ using NovoLog  U100 : No change in pump setting today.   - Home glucose testing: continue CGM and check blood glucose as needed.  - Discussed/ Gave Hypoglycemia treatment plan.  # Consult : not required at this time.   # Annual urine for microalbuminuria/ creatinine ratio, no microalbuminuria currently. Last  Lab Results  Component Value Date   MICRALBCREAT <30 03/11/2024    # Foot check nightly.  # Annual dilated diabetic eye exams.   - Diet: Make healthy diabetic food choices - Life style / activity / exercise: discussed.   2. Blood pressure  -  BP Readings from Last 1 Encounters:  07/08/24 (!) 142/72    - Control is in target.  - No change in current plans.  3. Lipid status / Hyperlipidemia - Last  Lab Results  Component Value Date   LDLCALC 128 (H) 03/30/2024   - Continue Zetia  10 mg daily.   Patient declined statin therapy.  Consider oral Livalo.  # Primary hypothyroidism -Continue Armour Thyroid  45 mg daily.  She had mildly low TSH in June.  Discussed harmful effect of overactive/over replacement of thyroid  hormone including arrhythmia risks and bone loss.  TSH is still low we will decrease Armour Thyroid  to 30 mg daily. - Check thyroid  function test TSH, free T4, free T3 today.  # Hyponatremia ?  SIADH -This is mild and asymptomatic.  Will check serum sodium periodically.  Advised for limitation of fluid/water intake. - Will check BMP with EGFR today.   Diagnoses and all orders for this visit:  Uncontrolled type 1 diabetes mellitus with hyperglycemia (HCC) -     POCT glycosylated hemoglobin (Hb A1C) -     Basic metabolic panel with GFR  Adult onset hypothyroidism -     T4, free -     T3, free -  TSH  Hyponatremia     DISPOSITION Follow up in clinic in 3  months suggested.  Labs today.   All questions answered and patient verbalized understanding of the plan.  [place] Jani Moronta, MD [facility] Endocrinology [facility] [address] [place], [address] [place], [place] [address] Phone # [phone]  At least part of this note was generated using voice recognition software. Inadvertent word errors may have occurred, which were not recognized during the proofreading process.

## 2024-07-13 DIAGNOSIS — E109 Type 1 diabetes mellitus without complications: Secondary | ICD-10-CM | POA: Diagnosis not present

## 2024-07-15 DIAGNOSIS — L508 Other urticaria: Secondary | ICD-10-CM | POA: Diagnosis not present

## 2024-07-29 DIAGNOSIS — E1065 Type 1 diabetes mellitus with hyperglycemia: Secondary | ICD-10-CM | POA: Diagnosis not present

## 2024-07-29 DIAGNOSIS — E109 Type 1 diabetes mellitus without complications: Secondary | ICD-10-CM | POA: Diagnosis not present

## 2024-08-10 ENCOUNTER — Other Ambulatory Visit (HOSPITAL_COMMUNITY): Payer: Self-pay | Admitting: Internal Medicine

## 2024-08-12 DIAGNOSIS — E109 Type 1 diabetes mellitus without complications: Secondary | ICD-10-CM | POA: Diagnosis not present

## 2024-08-24 DIAGNOSIS — E109 Type 1 diabetes mellitus without complications: Secondary | ICD-10-CM | POA: Diagnosis not present

## 2024-08-29 DIAGNOSIS — E1065 Type 1 diabetes mellitus with hyperglycemia: Secondary | ICD-10-CM | POA: Diagnosis not present

## 2024-09-11 DIAGNOSIS — E109 Type 1 diabetes mellitus without complications: Secondary | ICD-10-CM | POA: Diagnosis not present

## 2024-09-13 ENCOUNTER — Other Ambulatory Visit: Payer: Self-pay

## 2024-09-13 ENCOUNTER — Emergency Department

## 2024-09-13 ENCOUNTER — Emergency Department
Admission: EM | Admit: 2024-09-13 | Discharge: 2024-09-13 | Disposition: A | Discharge status: Home / Self Care | Attending: Emergency Medicine | Admitting: Emergency Medicine

## 2024-09-13 DIAGNOSIS — Z7901 Long term (current) use of anticoagulants: Secondary | ICD-10-CM | POA: Insufficient documentation

## 2024-09-13 DIAGNOSIS — E109 Type 1 diabetes mellitus without complications: Secondary | ICD-10-CM | POA: Insufficient documentation

## 2024-09-13 DIAGNOSIS — M25512 Pain in left shoulder: Secondary | ICD-10-CM | POA: Insufficient documentation

## 2024-09-13 DIAGNOSIS — R079 Chest pain, unspecified: Secondary | ICD-10-CM | POA: Insufficient documentation

## 2024-09-13 DIAGNOSIS — Z853 Personal history of malignant neoplasm of breast: Secondary | ICD-10-CM | POA: Diagnosis not present

## 2024-09-13 DIAGNOSIS — I10 Essential (primary) hypertension: Secondary | ICD-10-CM | POA: Diagnosis not present

## 2024-09-13 DIAGNOSIS — R0789 Other chest pain: Secondary | ICD-10-CM | POA: Diagnosis not present

## 2024-09-13 DIAGNOSIS — I700 Atherosclerosis of aorta: Secondary | ICD-10-CM | POA: Diagnosis not present

## 2024-09-13 DIAGNOSIS — I4891 Unspecified atrial fibrillation: Secondary | ICD-10-CM | POA: Insufficient documentation

## 2024-09-13 LAB — CBC WITH DIFFERENTIAL/PLATELET
Abs Immature Granulocytes: 0.03 K/uL (ref 0.00–0.07)
Basophils Absolute: 0.1 K/uL (ref 0.0–0.1)
Basophils Relative: 1 %
Eosinophils Absolute: 0.1 K/uL (ref 0.0–0.5)
Eosinophils Relative: 1 %
HCT: 41.2 % (ref 36.0–46.0)
Hemoglobin: 13.1 g/dL (ref 12.0–15.0)
Immature Granulocytes: 1 %
Lymphocytes Relative: 12 %
Lymphs Abs: 0.8 K/uL (ref 0.7–4.0)
MCH: 27.8 pg (ref 26.0–34.0)
MCHC: 31.8 g/dL (ref 30.0–36.0)
MCV: 87.3 fL (ref 80.0–100.0)
Monocytes Absolute: 0.6 K/uL (ref 0.1–1.0)
Monocytes Relative: 9 %
Neutro Abs: 4.9 K/uL (ref 1.7–7.7)
Neutrophils Relative %: 76 %
Platelets: 245 K/uL (ref 150–400)
RBC: 4.72 MIL/uL (ref 3.87–5.11)
RDW: 13.4 % (ref 11.5–15.5)
WBC: 6.4 K/uL (ref 4.0–10.5)
nRBC: 0 % (ref 0.0–0.2)

## 2024-09-13 LAB — COMPREHENSIVE METABOLIC PANEL WITH GFR
ALT: 16 U/L (ref 0–44)
AST: 24 U/L (ref 15–41)
Albumin: 4 g/dL (ref 3.5–5.0)
Alkaline Phosphatase: 77 U/L (ref 38–126)
Anion gap: 9 (ref 5–15)
BUN: 12 mg/dL (ref 8–23)
CO2: 27 mmol/L (ref 22–32)
Calcium: 8.9 mg/dL (ref 8.9–10.3)
Chloride: 98 mmol/L (ref 98–111)
Creatinine, Ser: 0.71 mg/dL (ref 0.44–1.00)
GFR, Estimated: >60 mL/min (ref >60)
Glucose, Bld: 159 mg/dL — ABNORMAL HIGH (ref 70–99)
Potassium: 3.9 mmol/L (ref 3.5–5.1)
Sodium: 134 mmol/L — ABNORMAL LOW (ref 135–145)
Total Bilirubin: 0.6 mg/dL (ref 0.0–1.2)
Total Protein: 6.7 g/dL (ref 6.5–8.1)

## 2024-09-13 LAB — TROPONIN T, HIGH SENSITIVITY
Troponin T High Sensitivity: <15 ng/L (ref 0–19)
Troponin T High Sensitivity: <15 ng/L (ref 0–19)

## 2024-09-13 LAB — D-DIMER, QUANTITATIVE: D-Dimer, Quant: <0.27 ug{FEU}/mL (ref 0.00–0.50)

## 2024-09-13 MED ORDER — NITROGLYCERIN 0.4 MG SL SUBL
0.4000 mg | SUBLINGUAL_TABLET | SUBLINGUAL | Status: DC | PRN
  Administered 2024-09-13: 0.4 mg via SUBLINGUAL
  Filled 2024-09-13: qty 1

## 2024-09-13 MED ORDER — ASPIRIN 325 MG PO TABS
325.0000 mg | ORAL_TABLET | Freq: Once | ORAL | Status: AC
  Administered 2024-09-13: 325 mg via ORAL
  Filled 2024-09-13: qty 1

## 2024-09-13 NOTE — ED Triage Notes (Signed)
 Pt comes with c/o mid sternal cp. Pt states this started at 530am and has now progressed to left shoulder. Pt states 5-7 out of 10 for shoulder pain. Pt states no sob. Pt states pain radiates to jaw a little bit.   Pt has hx of afib and on thinner.

## 2024-09-13 NOTE — ED Provider Notes (Signed)
 [facility] Provider Note    Event Date/Time   First MD Initiated Contact with Patient 09/13/24 [phone]     (approximate)   History   Chest Pain   HPI  Gina [name] is a 78 year old female with history of distant breast cancer, hypertension, type 1 diabetes, A-fib on Eliquis  presenting to the ER for evaluation of chest pain.  Around 530 this morning patient was woken up from her sleep with left-sided chest pain.  Reports pain is now in her left shoulder.  Currently rates a 3 out of 10 in her shoulder, denies chest pain.  No shortness of breath, diaphoresis, nausea.  Denies history of similar.     Physical Exam   Triage Vital Signs: ED Triage Vitals  Encounter Vitals Group     BP 09/13/24 0732 (!) 177/80     Girls Systolic BP Percentile --      Girls Diastolic BP Percentile --      Boys Systolic BP Percentile --      Boys Diastolic BP Percentile --      Pulse Rate 09/13/24 0732 70     Resp 09/13/24 0732 19     Temp 09/13/24 0732 97.9 F (36.6 C)     Temp src --      SpO2 09/13/24 0732 100 %     Weight 09/13/24 0731 144 lb (65.3 kg)     Height 09/13/24 0731 5' 4 (1.626 m)     Head Circumference --      Peak Flow --      Pain Score 09/13/24 0731 5     Pain Loc --      Pain Education --      Exclude from Growth Chart --     Most recent vital signs: Vitals:   09/13/24 0740 09/13/24 0830  BP:  (!) 173/79  Pulse:  69  Resp:  18  Temp:    SpO2: 100% 98%     General: Awake, interactive  CV:  Good peripheral perfusion Resp:  Unlabored respirations, lungs clear to auscultation Chest wall: Mild tenderness over the left anterior chest wall Abd:  Nondistended.  Neuro:  Symmetric facial movement, fluid speech   ED Results / Procedures / Treatments   Labs (all labs ordered are listed, but only abnormal results are displayed) Labs Reviewed  COMPREHENSIVE METABOLIC PANEL WITH GFR - Abnormal; Notable for the following components:       Result Value   Sodium 134 (*)    Glucose, Bld 159 (*)    All other components within normal limits  D-DIMER, QUANTITATIVE  CBC WITH DIFFERENTIAL/PLATELET  CBC WITH DIFFERENTIAL/PLATELET  TROPONIN T, HIGH SENSITIVITY  TROPONIN T, HIGH SENSITIVITY     EKG EKG independently reviewed and interpreted by myself demonstrates:  EKG demonstrates normal sinus rhythm at a rate of 69, PR 160, QRS 64, QTc 400, no acute ST changes  RADIOLOGY Imaging independently reviewed and interpreted by myself demonstrates:  CXR without focal consolidation   Formal Radiology Read:  DG Chest Portable 1 View Result Date: 09/13/2024 EXAM: 1 VIEW(S) XRAY OF THE CHEST 09/13/2024 08:15:25 AM COMPARISON: AP radiograph of the chest dated 03/06/2015. CLINICAL HISTORY: chest pain FINDINGS: LUNGS AND PLEURA: No focal pulmonary opacity. No pleural effusion. No pneumothorax. HEART AND MEDIASTINUM: Aortic atherosclerosis. BONES AND SOFT TISSUES: No acute osseous abnormality. IMPRESSION: 1. No acute findings. 2. Aortic atherosclerosis. Electronically signed by: Evalene Coho MD 09/13/2024 08:30 AM EST RP Workstation: [id number]  PROCEDURES:  Critical Care performed: No  Procedures   MEDICATIONS ORDERED IN ED: Medications  nitroGLYCERIN (NITROSTAT) SL tablet 0.4 mg (0.4 mg Sublingual Given 09/13/24 0804)  aspirin  tablet 325 mg (325 mg Oral Given 09/13/24 0803)     IMPRESSION / MDM / ASSESSMENT AND PLAN / ED COURSE  I reviewed the triage vital signs and the nursing notes.  Differential diagnosis includes, but is not limited to, ACS, PE, PTX, PNA  Patient's presentation is most consistent with acute presentation with potential threat to life or bodily function.  78 year old female presenting to the ER for evaluation of chest and shoulder pain. EKG without acute ischemic changes.  Reassuring CBC, CMP without critical derangements.  Negative troponin x 2.  Negative D-dimer.  Patient reassessed and reports  complete resolution of her symptoms.  I did consider and discussed admission given her clinical history and risk factors.  Patient reports she prefers discharge home and outpatient follow-up.  With her reassuring workup, I do think this is reasonable.  Will place urgent referral to cardiology.  Strict return precautions provided.  Patient discharged in stable condition.      FINAL CLINICAL IMPRESSION(S) / ED DIAGNOSES   Final diagnoses:  Nonspecific chest pain  Acute pain of left shoulder     Rx / DC Orders   ED Discharge Orders          Ordered    Ambulatory referral to Cardiology       Comments: If you have not heard from the Cardiology office within the next 72 hours please call [phone].   09/13/24 1114             Note:  This document was prepared using Dragon voice recognition software and may include unintentional dictation errors.   Levander Slate, MD 09/13/24 [phone]

## 2024-09-13 NOTE — Discharge Instructions (Signed)
 You were seen in the Emergency Department today for evaluation of your chest pain. Fortunately, your labs, EKG, and chest x-Gina [name] were overall reassuring against a emergency cause for your pain. I have placed a referral to cardiology for further evaluation of your chest pain.  Return to the ER for any new or worsening symptoms including worsening chest pain, difficulty breathing, or any other new or concerning symptoms that you believe warrants immediate attention.

## 2024-09-15 ENCOUNTER — Other Ambulatory Visit: Payer: Self-pay | Admitting: Endocrinology

## 2024-09-15 DIAGNOSIS — E7800 Pure hypercholesterolemia, unspecified: Secondary | ICD-10-CM

## 2024-09-15 NOTE — Telephone Encounter (Signed)
 Refill request complete

## 2024-09-16 ENCOUNTER — Ambulatory Visit: Payer: Self-pay

## 2024-09-16 NOTE — Telephone Encounter (Signed)
 FYI Only or Action Required?: Action required by provider: update on patient condition.  Patient was last seen in primary care on 01/09/2024 by Kayla Jeoffrey RAMAN, FNP.  Called Nurse Triage reporting Advice Only.   Triage Disposition: Call PCP Within 24 Hours  Patient/caregiver understands and will follow disposition?: Yes   Copied from CRM #[id number]. Topic: Clinical - Red Word Triage >> Sep 16, 2024 10:23 AM Treva T wrote: Red Word that prompted transfer to Nurse Triage: Pt states she was seen in [facility] ER on Sunday 09/13/24, with chest pain and pain in left shoulder.  Pt reports she is still having Slight pain in left shoulder occassionally, when taking a deep breath. Denies shortness of breath.   Per pt ER Advised pt to have stress test done in couple days. Pt states she has not heard anything from PCP with assisting in getting this test scheduled.   Ph. [phone] Reason for Disposition  [1] Follow-up call from patient regarding patient's clinical status AND [2] information NON-URGENT  Answer Assessment - Initial Assessment Questions 1. REASON FOR CALL or QUESTION: What is your reason for calling today? or How can I best     Pt called in stating that she was advised to schedule a stress test after being d/c from hospital but has not heard anything. Discussed that stress tests are usually performed by cardiology. Pt has appt scheduled 12/11 with Cardio for 1 year f/u. Discussed testing can be coordinated at that time. Pt states at this time she has no chest pain or SOB. She states her only symptom is low energy. Please advise.  Protocols used: PCP Call - No Triage-A-AH

## 2024-09-17 ENCOUNTER — Encounter: Payer: Self-pay | Admitting: Family Medicine

## 2024-09-17 ENCOUNTER — Ambulatory Visit: Admitting: Family Medicine

## 2024-09-17 VITALS — BP 160/72 | HR 65 | Temp 98.0°F | Ht 64.0 in | Wt 150.0 lb

## 2024-09-17 DIAGNOSIS — I480 Paroxysmal atrial fibrillation: Secondary | ICD-10-CM

## 2024-09-17 DIAGNOSIS — I10 Essential (primary) hypertension: Secondary | ICD-10-CM

## 2024-09-17 DIAGNOSIS — Z87898 Personal history of other specified conditions: Secondary | ICD-10-CM | POA: Insufficient documentation

## 2024-09-17 MED ORDER — LOSARTAN POTASSIUM 50 MG PO TABS: 50.0000 mg | ORAL_TABLET | Freq: Every day | ORAL | 1 refills | Status: AC

## 2024-09-17 NOTE — Addendum Note (Signed)
 Addended by: KAYLA JEOFFREY RAMAN on: 09/17/2024 02:04 PM   Modules accepted: Level of Service

## 2024-09-17 NOTE — Progress Notes (Signed)
 Acute Office Visit  Patient ID: Gina [name], female    DOB: [birth year], 78 y.o.   MRN: [id number]  PCP: Kayla Jeoffrey RAMAN, FNP  Chief Complaint  Patient presents with   Hospitalization Follow-up    09/13/24 Non specific chest pain  Was told that she needs a stress test ordered.    Subjective:     HPI  Gina [name] is here today for hospital follow up. She was seen in the ED on 09/13/2024 for nonspecific chest pain. Was treated with ASA and Nitroglycerin. Symptoms resolved. Her CBC, CMP, troponin, d-dimer, CXR, and EKG were all normal and she was referred to cardiology.   Discussed the use of AI scribe software for clinical note transcription with the patient, who gave verbal consent to proceed.  History of Present Illness Gina [name] is an 78 year old female with atrial fibrillation who presents with a recent episode of chest pain and elevated blood pressure.  She experienced a severe headache and left arm pain on the morning of September 13, 2024, prompting a visit to the emergency room. The headache was described as 'horrible' with pain radiating to her left arm. In the emergency room, a comprehensive workup including blood tests, EKG, and chest X-ray returned normal results. She was administered baby aspirin  and nitroglycerin, which alleviated the headache by the end of the day. However, she continued to experience left shoulder pain, particularly when taking deep breaths, for two to three days following the incident.  She has a history of atrial fibrillation and is scheduled to visit the AFib clinic on September 24, 2024. She has not experienced any chest pain since the hospital visit on September 13, 2024. She does not recall being referred to cardiology from the emergency room but has a scheduled visit to the AFib clinic. She has not been seen by a cardiologist outside of the AFib clinic.  Her blood pressure has been consistently high since September 2025, despite being normal  earlier in the year. She does not regularly monitor her blood pressure at home despite having the equipment. She has a history of stopping all supplements, including vitamins, due to a rash.  She has experienced episodes of feeling lightheaded and needing to sit down, which she associates with her atrial fibrillation. She describes a sensation of needing to 'sit down and get my head between my knees' to prevent syncope. She recalls a similar episode last Thanksgiving when she experienced syncope, which was when her atrial fibrillation was identified.  No shortness of breath, sweating, or flushing associated with her recent symptoms. She denies any recent neck problems or activities that could have contributed to her shoulder pain.  Past Medical History:  Diagnosis Date   Anxiety    new dx   Atrial fibrillation (HCC)    a. 03/2015 s/p RFCA;  b. CHA2DS2VASc = 5-->Eliquis .   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 12/2014 - due to AF RVR;  b. 03/2015 Echo: EF 60-65%, no rwma.   Hyperlipidemia    Hypertension    Hypothyroidism    Insulin  pump in place    Ovarian cyst    S/P radiation therapy 04/10/12- 05/26/2012   left Breast and Axilla / 50 gy / 25 Fractions with Left Breast Boost / 10 Gy / 5  Fractions   SIADH (syndrome of inappropriate ADH production)    Use of letrozole  (Femara ) 06/13/12   .pah Current Outpatient Medications on File Prior to Visit  Medication Sig Dispense Refill   aspirin  EC 81 MG tablet Take 81 mg by mouth daily. Swallow whole.     Continuous Glucose Sensor (DEXCOM G7 SENSOR) MISC by Does not apply route.     ELIQUIS  5 MG TABS tablet TAKE ONE TABLET (5 MG TOTAL) BY MOUTH TWO (TWO) TIMES DAILY. 180 tablet 2   ezetimibe  (ZETIA ) 10 MG tablet Take 10 mg by mouth daily.     glucose blood  (ONE TOUCH ULTRA TEST) test strip Use asto check blood sugar 5 times a day 200 each 5   insulin  aspart (NOVOLOG ) 100 UNIT/ML injection USE MAX OF 60 UNITS DAILY VIA INSULIN  PUMP. 50 mL 3   thyroid  (ARMOUR THYROID ) 15 MG tablet TAKE 1 TABLET DAILY WITH THE 30 MG TABLET (TOTAL OF 45 MG DAILY) 90 tablet 3   thyroid  (ARMOUR THYROID ) 30 MG tablet TAKE ONE TABLET DAILY ALONG WITH THE 15 MG TABLET. (TOTAL DOSAGE OF 45 MG DAILY) 90 tablet 1   insulin  aspart (NOVOLOG ) 100 UNIT/ML injection Inject into the skin. (Patient not taking: Reported on 09/17/2024)     Misc Natural Products (GLUCOSAMINE CHOND MSM FORMULA PO) Take 15 mLs by mouth daily. (Patient not taking: Reported on 09/17/2024)     Current Facility-Administered Medications on File Prior to Visit  Medication Dose Route Frequency Provider Last Rate Last Admin   topical emolient (BIAFINE) emulsion   Topical Daily Izell Domino, MD   Given at 05/28/12 1400  . Allergies  Allergen Reactions   Rosuvastatin  Calcium      Headaches, fatigue   Rosuvastatin  Calcium  Other (See Comments)    Headaches, fatigue     Review of Systems  Respiratory: Negative.    Cardiovascular: Negative.   Neurological: Negative.   All other systems reviewed and are negative.      Objective:    BP (!) 160/72   Pulse 65   Temp 98 F (36.7 C)   Ht 5' 4 (1.626 m)   Wt 150 lb (68 kg)   SpO2 98%   BMI 25.75 kg/m  BP Readings from Last 3 Encounters:  09/17/24 (!) 160/72  09/13/24 (!) 151/67  07/08/24 (!) 142/72   Wt Readings from Last 3 Encounters:  09/17/24 150 lb (68 kg)  09/13/24 144 lb (65.3 kg)  07/08/24 145 lb 6.4 oz (66 kg)      Physical Exam Vitals and nursing note reviewed.  Constitutional:      Appearance: Normal appearance. She is normal weight.  HENT:     Head: Normocephalic and atraumatic.  Cardiovascular:     Rate and Rhythm: Normal rate. Rhythm irregular.     Pulses: Normal pulses.     Heart sounds: Normal heart sounds.  Pulmonary:      Effort: Pulmonary effort is normal.     Breath sounds: Normal breath sounds.  Skin:    General: Skin is warm and dry.  Neurological:     General: No focal deficit present.     Mental Status: She is alert and oriented to person, place, and time. Mental status is at baseline.  Psychiatric:        Mood and Affect: Mood normal.        Behavior: Behavior normal.        Thought Content: Thought content normal.  Judgment: Judgment normal.       No results found for any visits on 09/17/24.     Assessment & Plan:   Problem List Items Addressed This Visit     Essential hypertension (Chronic)   Relevant Medications   losartan  (COZAAR ) 50 MG tablet   Paroxysmal atrial fibrillation (HCC)   Relevant Medications   losartan  (COZAAR ) 50 MG tablet   Other Relevant Orders   EKG 12-Lead   History of chest pain - Primary   Relevant Orders   Ambulatory referral to Cardiology    Assessment and Plan Assessment & Plan Hypertension Blood pressure elevated since September, high during hospitalization and today's visit. Possible factors: supplement discontinuation, stress. - Will double losartan  to 50 mg twice a day (two 25 mg tablets) - Keep appointment with cardiology - Instructed to monitor blood pressure at home. - Scheduled follow-up in one month to assess blood pressure control. - CMP at next visit  Paroxysmal atrial fibrillation Atrial fibrillation confirmed during hospitalization. Symptoms include occasional lightheadedness and near-syncope. - Performed EKG to assess current rhythm. NSR - Continue current management and follow up with AFib clinic.  Evaluation of chest pain Recent chest pain episode resolved without intervention. Hospital workup normal. Referral to cardiology for further evaluation and stress test needed. - Placed referral to cardiology for stress test and further evaluation. - Instructed to call cardiology office if not contacted within 72 hours. -  Scheduled follow-up with cardiology for stress test.    Meds ordered this encounter  Medications   losartan  (COZAAR ) 50 MG tablet    Sig: Take 1 tablet (50 mg total) by mouth daily.    Dispense:  30 tablet    Refill:  1    Supervising Provider:   DUANNE LOWERS T [[id number]]    Return in about 4 weeks (around 10/15/2024) for hypertension.  Jeoffrey GORMAN Barrio, FNP [facility]

## 2024-09-21 ENCOUNTER — Ambulatory Visit

## 2024-09-21 VITALS — BP 138/86 | HR 77 | Ht 64.0 in | Wt 151.6 lb

## 2024-09-21 DIAGNOSIS — R079 Chest pain, unspecified: Secondary | ICD-10-CM | POA: Diagnosis not present

## 2024-09-21 DIAGNOSIS — Z789 Other specified health status: Secondary | ICD-10-CM

## 2024-09-21 DIAGNOSIS — I4891 Unspecified atrial fibrillation: Secondary | ICD-10-CM | POA: Diagnosis not present

## 2024-09-21 DIAGNOSIS — I10 Essential (primary) hypertension: Secondary | ICD-10-CM

## 2024-09-21 DIAGNOSIS — E785 Hyperlipidemia, unspecified: Secondary | ICD-10-CM

## 2024-09-21 MED ORDER — AMLODIPINE BESYLATE 10 MG PO TABS: 10.0000 mg | ORAL_TABLET | Freq: Every day | ORAL | 3 refills | Status: DC

## 2024-09-21 NOTE — Progress Notes (Signed)
 Cardiology Office Note Date:  09/21/2024  ID:  Gina [name], DOB 24-Jul-1946, MRN [id number] PCP:  Kayla Jeoffrey RAMAN, FNP  Cardiologist:   Joelle VEAR Ren Donley, MD  Chief Complaint  Patient presents with   Chest Pain      Problems pAF s/p PVI 03/2015 ED for CP SPECT 3/16 low risk TTE 4/23  60-65%, mild LVH T1DM Labs: HA1C 6.8 9/25, LDL 128 6/25 M: ASA81, AN5, EE10, Insulin , LN50  Visits  ED 11/25: CP, ruled out for ACS 12/25: SPECT, TTE, d/c ASA, AE10, BP log, 1 month w/ APP    History of Present Illness: Gina [name] is a 78 y.o. female who presents for new visit.   About a week ago, she was awoken by an episode of chest pain at 5 AM that was left-sided and radiated to the left shoulder.  The pain lasted about 2 hours, while the shoulder pain lasted about 2 days.  She denies any trauma to the area.  She denies any associated dyspnea, nausea, vomiting, or diaphoresis.  She has no angina at baseline, though is not very active.  She checks her blood pressure about twice a day since hospital discharge.  Systolic has been consistently in the 180s.  She denies any tobacco use.  She denies any family history of heart disease.  ROS: Please see the history of present illness. All other systems are reviewed and negative.   Past Medical History:  Diagnosis Date   Anxiety    new dx   Atrial fibrillation (HCC)    a. 03/2015 s/p RFCA;  b. CHA2DS2VASc = 5-->Eliquis .   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 12/2014 - due to AF RVR;  b. 03/2015 Echo: EF 60-65%, no rwma.   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 Swallow whole.     Continuous Glucose Sensor (DEXCOM G7 SENSOR) MISC by Does not apply route.     ELIQUIS  5 MG TABS tablet TAKE ONE TABLET (5 MG TOTAL) BY MOUTH TWO (TWO) TIMES DAILY. 180 tablet 2   ezetimibe  (ZETIA ) 10 MG tablet Take 10 mg  by mouth daily.     glucose blood (ONE TOUCH ULTRA TEST) test strip Use asto check blood sugar 5 times a day 200 each 5   insulin  aspart (NOVOLOG ) 100 UNIT/ML injection USE MAX OF 60 UNITS DAILY VIA INSULIN  PUMP. 50 mL 3   losartan  (COZAAR ) 50 MG tablet Take 1 tablet (50 mg total) by mouth daily. 30 tablet 1   thyroid  (ARMOUR THYROID ) 15 MG tablet TAKE 1 TABLET DAILY WITH THE 30 MG TABLET (TOTAL OF 45 MG DAILY) 90 tablet 3   thyroid  (ARMOUR THYROID ) 30 MG tablet TAKE ONE TABLET DAILY ALONG WITH THE 15 MG TABLET. (TOTAL DOSAGE OF 45 MG DAILY) 90 tablet 1   insulin  aspart (NOVOLOG ) 100 UNIT/ML injection Inject into the skin. (Patient not taking: Reported on 09/21/2024)     Misc Natural Products (GLUCOSAMINE CHOND MSM FORMULA PO) Take 15 mLs by mouth daily. (Patient not taking: Reported on 09/21/2024)     No current  facility-administered medications for this visit.   Facility-Administered Medications Ordered in Other Visits  Medication Dose Route Frequency Provider Last Rate Last Admin   topical emolient (BIAFINE) emulsion   Topical Daily Izell Domino, MD   Given at 05/28/12 1400    Allergies:   Rosuvastatin  calcium  and Rosuvastatin  calcium    Social History:  see above  Family History:  see above  PHYSICAL EXAM: VS:  BP 138/86   Pulse 77   Ht 5' 4 (1.626 m)   Wt 151 lb 9.6 oz (68.8 kg)   SpO2 96%   BMI 26.02 kg/m  , BMI Body mass index is 26.02 kg/m. GEN: Well nourished, well developed, in no acute distress HEENT: normal Neck: no JVD, carotid bruits, or masses Cardiac: RRR; no murmurs, rubs, or gallops,no edema  Respiratory:  CTAB bilaterally, normal work of breathing GI: soft, nontender, nondistended, + BS Extremities: No LE edema Skin: warm and dry, no rash Neuro:  Strength and sensation are intact   Recent Labs: Reviewed  Studies: Reviewed  ASSESSMENT AND PLAN: Gina [name] is a 78 y.o. female who presents for new visit.  - Her chest pain is very concerning for cardiac etiology.  Will obtain a 2D echocardiogram and exercise nuclear test. - Given that patient is on Eliquis  at baseline, we will stop aspirin  given increased risk of bleeding without additional CV benefit in chronic CAD. - Given significantly elevated BP at home, we will start amlodipine  10 mg daily.  Provided with a blood pressure log to record her BP. - Will also obtain a lipid panel. - Follow up in 1 month with APP for BP and symptoms recurrence.   Signed, Joelle VEAR Ren Donley, MD  09/21/2024 4:07 PM    [facility]

## 2024-09-21 NOTE — Patient Instructions (Addendum)
 Medication Instructions:  STOP Aspirin   START Norvasc  10mg  Take 1 tablet once a day  *If you need a refill on your cardiac medications before your next appointment, please call your pharmacy*  Lab Work: None ordered If you have labs (blood work) drawn today and your tests are completely normal, you will receive your results only by: MyChart Message (if you have MyChart) OR A paper copy in the mail If you have any lab test that is abnormal or we need to change your treatment, we will call you to review the results.  Testing/Procedures: Your physician has requested that you have an exercise stress myoview. For further information please visit [email or web address] Please follow instruction sheet, as given.  Your physician has requested that you have an echocardiogram. Echocardiography is a painless test that uses sound waves to create images of your heart. It provides your doctor with information about the size and shape of your heart and how well your heart's chambers and valves are working. This procedure takes approximately one hour. There are no restrictions for this procedure. Please do NOT wear cologne, perfume, aftershave, or lotions (deodorant is allowed). Please arrive 15 minutes prior to your appointment time.  Please note: We ask at that you not bring children with you during ultrasound (echo/ vascular) testing. Due to room size and safety concerns, children are not allowed in the ultrasound rooms during exams. Our front office staff cannot provide observation of children in our lobby area while testing is being conducted. An adult accompanying a patient to their appointment will only be allowed in the ultrasound room at the discretion of the ultrasound technician under special circumstances. We apologize for any inconvenience.  Follow-Up: At [facility], you and your health needs are our priority.  As part of our continuing mission to provide you with exceptional heart care,  our providers are all part of one team.  This team includes your primary Cardiologist (physician) and Advanced Practice Providers or APPs (Physician Assistants and Nurse Practitioners) who all work together to provide you with the care you need, when you need it.  Your next appointment:   1 month(s) TO DISCUSS BLOOD PRESSURE  Provider:   ANY APP  We recommend signing up for the patient portal called MyChart.  Sign up information is provided on this After Visit Summary.  MyChart is used to connect with patients for Virtual Visits (Telemedicine).  Patients are able to view lab/test results, encounter notes, upcoming appointments, etc.  Non-urgent messages can be sent to your provider as well.   To learn more about what you can do with MyChart, go to [email or web address].   Other Instructions

## 2024-09-24 ENCOUNTER — Ambulatory Visit (HOSPITAL_COMMUNITY): Admission: RE | Admit: 2024-09-24 | Discharge: 2024-09-24 | Attending: Internal Medicine | Admitting: Internal Medicine

## 2024-09-24 ENCOUNTER — Telehealth (HOSPITAL_COMMUNITY): Payer: Self-pay | Admitting: Radiology

## 2024-09-24 ENCOUNTER — Encounter (HOSPITAL_COMMUNITY): Payer: Self-pay | Admitting: Internal Medicine

## 2024-09-24 VITALS — BP 146/80 | HR 69 | Ht 64.0 in | Wt 150.6 lb

## 2024-09-24 DIAGNOSIS — D6869 Other thrombophilia: Secondary | ICD-10-CM | POA: Diagnosis not present

## 2024-09-24 DIAGNOSIS — I4819 Other persistent atrial fibrillation: Secondary | ICD-10-CM

## 2024-09-24 DIAGNOSIS — I480 Paroxysmal atrial fibrillation: Secondary | ICD-10-CM | POA: Diagnosis not present

## 2024-09-24 MED ORDER — APIXABAN 5 MG PO TABS: 5.0000 mg | ORAL_TABLET | Freq: Two times a day (BID) | ORAL | 3 refills | Status: AC

## 2024-09-24 NOTE — Telephone Encounter (Signed)
 Patient given detailed instructions per Myocardial Perfusion Study Information Sheet for the test on 10/21/2024 at 10:30. Patient notified to arrive 15 minutes early and that it is imperative to arrive on time for appointment to keep from having the test rescheduled.  If you need to cancel or reschedule your appointment, please call the office within 24 hours of your appointment. . Patient verbalized understanding.EHK

## 2024-09-24 NOTE — Progress Notes (Signed)
 Primary Care Physician: Kayla Jeoffrey RAMAN, FNP Referring Physician: Dr. Kelsie Macintosh Pearman is a 78 y.o. female with a h/o paroxysmal  afib s/p ablation 04/08/15. She had done very well with no reoccurrence of  sustained afib.She is off antiarrythmic's and rate control for some time. She continue on eliquis  without bleeding problems.   F/u in afib clinic, 09/20/21. EKG shows today NSR. She reports no awareness of afib and that she feels well. She does report that she had a syncopal spell while cooking this past Thanksgiving dinner. She was noted to have a BP of 88 systolic at the time. She may have been dehydrated and with prolonging standing, her BP dropped. She was able to complete cooking the meal after her husband helped her get up. BP returned to normal limits within a few minutes.  No heart rate issues. No neuro signs.  Had one other syncopal spell in 2019 but cannot remember specifics.   F/u in afib clinic 02/01/22. She is here today as  she felt she may be back in afib. EKG today shows SR. She states that the morning of 4/12, shortly after she got up, she vomited and flet weak. She then started checking her V/S and her BP was low  and heart rate was irregular but not elevated. She improved during the day but felt  weak, she has not had any other spells.   F/u in afib clinic, 09/20/22, since I saw her 4/20, she was seen int the ER for a sudden loss of vision and left central retinal artery occlusion. She wore an event monitor for 2 weeks and there was no afib. She has seen Neurology and Dr. Claudene for f/u. She was compliant with Eliquis . Baby ASA was added and she has regained most of her vision from the stroke. No afib that she has noted. She had very infrequent near syncope or brief syncope spells that she has had for years and frequently occur with standing, resolve with sitting. V/S and blood sugars never show any irregularity. She has mentioned to to  her other providers as well. She is not  driving now.   F/u in Afib clinic, 09/25/23. She is currently in NSR. She has had no episodes of Afib since last office visit. She wears an Apple watch. She has no bleeding issues on Eliquis  5 mg BID.   Follow-up 09/24/2024.  Patient is currently in NSR.  She has had overall very low A-fib burden since last office visit.  She has not had any further episodes of chest pain since cardiology visit on 12/8.  She monitors her rhythm at home with Apple watch.  She does note occasional episodes of lightheadedness but the husband states she may not stay well-hydrated on certain days.  She does take 2 medications for hypertension.  No bleeding issues on Eliquis  5 mg twice daily.  Today, she denies symptoms of palpitations, shortness of breath, orthopnea, PND, lower extremity edema,  syncope, or neurologic sequela. The patient is tolerating medications without difficulties and is otherwise without complaint today.   Past Medical History:  Diagnosis Date   Anxiety    new dx   Atrial fibrillation (HCC)    a. 03/2015 s/p RFCA;  b. CHA2DS2VASc = 5-->Eliquis .   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 12/2014 - due to AF RVR;  b. 03/2015 Echo: EF 60-65%, no rwma.   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 Node Positive for Mets.: ER/PR POs., Her 2 Neu negative,  Ki-67 12%   COLONOSCOPY     1 polyp    ELECTROPHYSIOLOGIC STUDY N/A 04/08/2015   PVI + CTI ablation + posterior Box lesion + Atach ablation by Dr Kelsie   OVARIAN CYST SURGERY     TEE WITHOUT CARDIOVERSION N/A 04/07/2015   Procedure: TRANSESOPHAGEAL ECHOCARDIOGRAM (TEE);  Surgeon: Aleene JINNY Passe, MD;  Location: [facility] ENDOSCOPY;  Service: Cardiovascular;  Laterality: N/A;   TONSILLECTOMY      Current Outpatient Medications  Medication Sig Dispense Refill   amLODipine  (NORVASC ) 10 MG tablet Take 1 tablet (10 mg total) by mouth daily. 30 tablet 3   Continuous Glucose Sensor (DEXCOM G7 SENSOR) MISC by Does not apply route.     ELIQUIS  5 MG TABS tablet TAKE ONE TABLET (5 MG TOTAL) BY MOUTH TWO (TWO) TIMES DAILY. 180 tablet 2   ezetimibe  (ZETIA ) 10 MG tablet Take 10 mg by mouth daily.     glucose blood (ONE TOUCH ULTRA TEST) test strip Use asto check blood sugar 5 times a day 200 each 5   insulin  aspart (NOVOLOG ) 100 UNIT/ML injection Inject into the skin. (Patient not taking: Reported on 09/21/2024)     insulin  aspart (NOVOLOG ) 100 UNIT/ML injection USE MAX OF 60 UNITS DAILY VIA INSULIN  PUMP. 50 mL 3   losartan  (COZAAR ) 50 MG tablet Take 1 tablet (50 mg total) by mouth daily. 30 tablet 1   Misc Natural Products (GLUCOSAMINE CHOND MSM FORMULA PO) Take 15 mLs by mouth daily. (Patient not taking: Reported on 09/21/2024)     thyroid  (ARMOUR THYROID ) 15 MG tablet TAKE 1 TABLET DAILY WITH THE 30 MG TABLET (TOTAL OF 45 MG DAILY) 90 tablet 3   thyroid  (ARMOUR THYROID ) 30 MG tablet TAKE ONE TABLET DAILY ALONG WITH THE 15 MG TABLET. (TOTAL DOSAGE OF 45 MG DAILY) 90 tablet 1   No current facility-administered medications for this visit.   Facility-Administered Medications Ordered in Other Visits  Medication Dose Route Frequency Provider Last Rate Last Admin   topical emolient (BIAFINE) emulsion   Topical Daily Izell Domino, MD   Given at 05/28/12 1400    Allergies  Allergen Reactions   Rosuvastatin   Calcium      Headaches, fatigue   Rosuvastatin  Calcium  Other (See Comments)    Headaches, fatigue   ROS- All systems are reviewed and negative except as per the HPI above  Physical Exam: There were no vitals filed for this visit.  Wt Readings from Last 3 Encounters:  09/21/24 68.8 kg  09/17/24 68 kg  09/13/24 65.3 kg    Labs: Lab Results  Component Value Date   NA 134 (L) 09/13/2024   K 3.9 09/13/2024   CL 98 09/13/2024   CO2 27 09/13/2024   GLUCOSE 159 (H) 09/13/2024  BUN 12 09/13/2024   CREATININE 0.71 09/13/2024   CALCIUM  8.9 09/13/2024   MG 1.7 01/13/2015   Lab Results  Component Value Date   INR 1.1 02/10/2022   Lab Results  Component Value Date   CHOL 225 (H) 03/30/2024   HDL 82 03/30/2024   LDLCALC 128 (H) 03/30/2024   TRIG 60 03/30/2024   GEN- The patient is well appearing, alert and oriented x 3 today.   Neck - no JVD or carotid bruit noted Lungs- Clear to ausculation bilaterally, normal work of breathing Heart- Regular rate and rhythm, no murmurs, rubs or gallops, PMI not laterally displaced Extremities- no clubbing, cyanosis, or edema Skin - no rash or ecchymosis noted   EKG: EKG Interpretation Date/Time:  Thursday September 24 2024 10:02:20 EST Ventricular Rate:  69 PR Interval:  168 QRS Duration:  70 QT Interval:  384 QTC Calculation: 411 R Axis:   71  Text Interpretation: Sinus rhythm with Premature atrial complexes Cannot rule out Septal infarct (cited on or before 25-Sep-2023) Abnormal ECG When compared with ECG of 13-Sep-2024 07:31, Premature atrial complexes are now Present Confirmed by Terra Pac ([id number]) on 09/24/2024 10:21:09 AM    ECHO 02/11/22:  1. Left ventricular ejection fraction, by estimation, is 60 to 65%. The  left ventricle has normal function. The left ventricle has no regional  wall motion abnormalities. There is mild concentric left ventricular  hypertrophy. Left ventricular diastolic  parameters are indeterminate.    2. Right ventricular systolic function is normal. The right ventricular  size is normal. There is normal pulmonary artery systolic pressure. The  estimated right ventricular systolic pressure is 19.0 mmHg.   3. The mitral valve is degenerative. Trivial mitral valve regurgitation.  No evidence of mitral stenosis. Moderate mitral annular calcification.   4. The aortic valve is tricuspid. Aortic valve regurgitation is not  visualized. Aortic valve sclerosis/calcification is present, without any  evidence of aortic stenosis. Aortic valve area, by VTI measures 2.45 cm.  Aortic valve mean gradient measures  2.0 mmHg. Aortic valve Vmax measures 1.05 m/s.   5. The inferior vena cava is normal in size with greater than 50%  respiratory variability, suggesting right atrial pressure of 3 mmHg.    Assessment and Plan: 1. Afib Patient appears to be maintaining SR.  She was just seen by cardiology on 12/8 and is undergoing cardiac workup for chest pain.  She has overall very low A-fib burden.  Will continue with conservative observation.  2. CHA2DS2VASc score of at least 4 Continue Eliquis  5 mg twice daily.  Dosage of 5 mg is correct based on weight greater than 60 kg and creatinine below 1.5.  3. HTN Stable today.      Follow up in Afib clinic in 1 year.   Dorn Terra, PA-C Afib Clinic [facility] [address] [place], [place] [address] [phone]

## 2024-09-27 ENCOUNTER — Emergency Department

## 2024-09-27 ENCOUNTER — Observation Stay
Admission: EM | Admit: 2024-09-27 | Discharge: 2024-09-29 | Disposition: A | Attending: Internal Medicine | Admitting: Internal Medicine

## 2024-09-27 ENCOUNTER — Other Ambulatory Visit: Payer: Self-pay

## 2024-09-27 DIAGNOSIS — Z79899 Other long term (current) drug therapy: Secondary | ICD-10-CM | POA: Diagnosis not present

## 2024-09-27 DIAGNOSIS — E039 Hypothyroidism, unspecified: Secondary | ICD-10-CM | POA: Diagnosis not present

## 2024-09-27 DIAGNOSIS — J101 Influenza due to other identified influenza virus with other respiratory manifestations: Secondary | ICD-10-CM | POA: Diagnosis not present

## 2024-09-27 DIAGNOSIS — Z7901 Long term (current) use of anticoagulants: Secondary | ICD-10-CM | POA: Insufficient documentation

## 2024-09-27 DIAGNOSIS — E876 Hypokalemia: Secondary | ICD-10-CM | POA: Insufficient documentation

## 2024-09-27 DIAGNOSIS — I5032 Chronic diastolic (congestive) heart failure: Secondary | ICD-10-CM | POA: Insufficient documentation

## 2024-09-27 DIAGNOSIS — I480 Paroxysmal atrial fibrillation: Secondary | ICD-10-CM | POA: Diagnosis not present

## 2024-09-27 DIAGNOSIS — R55 Syncope and collapse: Secondary | ICD-10-CM | POA: Diagnosis not present

## 2024-09-27 DIAGNOSIS — I4819 Other persistent atrial fibrillation: Secondary | ICD-10-CM

## 2024-09-27 DIAGNOSIS — Z87891 Personal history of nicotine dependence: Secondary | ICD-10-CM | POA: Insufficient documentation

## 2024-09-27 DIAGNOSIS — Z9641 Presence of insulin pump (external) (internal): Secondary | ICD-10-CM | POA: Insufficient documentation

## 2024-09-27 DIAGNOSIS — R509 Fever, unspecified: Secondary | ICD-10-CM | POA: Insufficient documentation

## 2024-09-27 DIAGNOSIS — E1065 Type 1 diabetes mellitus with hyperglycemia: Secondary | ICD-10-CM | POA: Diagnosis not present

## 2024-09-27 DIAGNOSIS — I10 Essential (primary) hypertension: Secondary | ICD-10-CM | POA: Diagnosis present

## 2024-09-27 DIAGNOSIS — E871 Hypo-osmolality and hyponatremia: Secondary | ICD-10-CM | POA: Diagnosis not present

## 2024-09-27 DIAGNOSIS — Z794 Long term (current) use of insulin: Secondary | ICD-10-CM | POA: Diagnosis not present

## 2024-09-27 DIAGNOSIS — R5381 Other malaise: Secondary | ICD-10-CM | POA: Diagnosis not present

## 2024-09-27 DIAGNOSIS — Z853 Personal history of malignant neoplasm of breast: Secondary | ICD-10-CM | POA: Insufficient documentation

## 2024-09-27 DIAGNOSIS — E109 Type 1 diabetes mellitus without complications: Secondary | ICD-10-CM | POA: Diagnosis present

## 2024-09-27 DIAGNOSIS — I110 Hypertensive heart disease with heart failure: Secondary | ICD-10-CM | POA: Diagnosis not present

## 2024-09-27 LAB — URINALYSIS, ROUTINE W REFLEX MICROSCOPIC
Bacteria, UA: NONE SEEN
Bilirubin Urine: NEGATIVE
Glucose, UA: NEGATIVE mg/dL
Hgb urine dipstick: NEGATIVE
Ketones, ur: 20 mg/dL — AB
Leukocytes,Ua: NEGATIVE
Nitrite: NEGATIVE
Protein, ur: 100 mg/dL — AB
Specific Gravity, Urine: 1.017 (ref 1.005–1.030)
pH: 5 (ref 5.0–8.0)

## 2024-09-27 LAB — CBC WITH DIFFERENTIAL/PLATELET
Abs Immature Granulocytes: 0.06 K/uL (ref 0.00–0.07)
Basophils Absolute: 0.1 K/uL (ref 0.0–0.1)
Basophils Relative: 1 %
Eosinophils Absolute: 0 K/uL (ref 0.0–0.5)
Eosinophils Relative: 0 %
HCT: 38.8 % (ref 36.0–46.0)
Hemoglobin: 12.5 g/dL (ref 12.0–15.0)
Immature Granulocytes: 1 %
Lymphocytes Relative: 6 %
Lymphs Abs: 0.4 K/uL — ABNORMAL LOW (ref 0.7–4.0)
MCH: 28.3 pg (ref 26.0–34.0)
MCHC: 32.2 g/dL (ref 30.0–36.0)
MCV: 87.8 fL (ref 80.0–100.0)
Monocytes Absolute: 0.5 K/uL (ref 0.1–1.0)
Monocytes Relative: 8 %
Neutro Abs: 6.1 K/uL (ref 1.7–7.7)
Neutrophils Relative %: 84 %
Platelets: 203 K/uL (ref 150–400)
RBC: 4.42 MIL/uL (ref 3.87–5.11)
RDW: 13.4 % (ref 11.5–15.5)
WBC: 7.2 K/uL (ref 4.0–10.5)
nRBC: 0 % (ref 0.0–0.2)

## 2024-09-27 LAB — TROPONIN T, HIGH SENSITIVITY
Troponin T High Sensitivity: <15 ng/L (ref 0–19)
Troponin T High Sensitivity: 15 ng/L (ref 0–19)

## 2024-09-27 LAB — RESPIRATORY PANEL BY PCR

## 2024-09-27 LAB — COMPREHENSIVE METABOLIC PANEL WITH GFR
ALT: 20 U/L (ref 0–44)
AST: 26 U/L (ref 15–41)
Albumin: 4 g/dL (ref 3.5–5.0)
Alkaline Phosphatase: 76 U/L (ref 38–126)
Anion gap: 13 (ref 5–15)
BUN: 13 mg/dL (ref 8–23)
CO2: 25 mmol/L (ref 22–32)
Calcium: 8.9 mg/dL (ref 8.9–10.3)
Chloride: 96 mmol/L — ABNORMAL LOW (ref 98–111)
Creatinine, Ser: 0.8 mg/dL (ref 0.44–1.00)
GFR, Estimated: >60 mL/min (ref >60)
Glucose, Bld: 215 mg/dL — ABNORMAL HIGH (ref 70–99)
Potassium: 4.4 mmol/L (ref 3.5–5.1)
Sodium: 134 mmol/L — ABNORMAL LOW (ref 135–145)
Total Bilirubin: 0.8 mg/dL (ref 0.0–1.2)
Total Protein: 6.6 g/dL (ref 6.5–8.1)

## 2024-09-27 LAB — D-DIMER, QUANTITATIVE: D-Dimer, Quant: 2.1 ug{FEU}/mL — ABNORMAL HIGH (ref 0.00–0.50)

## 2024-09-27 LAB — RESP PANEL BY RT-PCR (RSV, FLU A&B, COVID)  RVPGX2
Influenza A by PCR: POSITIVE — AB
Influenza B by PCR: NEGATIVE
Resp Syncytial Virus by PCR: NEGATIVE
SARS Coronavirus 2 by RT PCR: NEGATIVE

## 2024-09-27 LAB — LACTIC ACID, PLASMA: Lactic Acid, Venous: 0.9 mmol/L (ref 0.5–1.9)

## 2024-09-27 LAB — TSH: TSH: 2.35 u[IU]/mL (ref 0.350–4.500)

## 2024-09-27 LAB — PRO BRAIN NATRIURETIC PEPTIDE: Pro Brain Natriuretic Peptide: 719 pg/mL — ABNORMAL HIGH (ref <300.0)

## 2024-09-27 LAB — CBG MONITORING, ED
Glucose-Capillary: 216 mg/dL — ABNORMAL HIGH (ref 70–99)
Glucose-Capillary: 184 mg/dL — ABNORMAL HIGH (ref 70–99)
Glucose-Capillary: 214 mg/dL — ABNORMAL HIGH (ref 70–99)

## 2024-09-27 LAB — MAGNESIUM: Magnesium: 2.1 mg/dL (ref 1.7–2.4)

## 2024-09-27 MED ORDER — BENZONATATE 100 MG PO CAPS
100.0000 mg | ORAL_CAPSULE | Freq: Three times a day (TID) | ORAL | Status: DC | PRN
  Administered 2024-09-28: 10:00:00 100 mg via ORAL
  Filled 2024-09-27: qty 1

## 2024-09-27 MED ORDER — ONDANSETRON HCL 4 MG PO TABS: 4.0000 mg | ORAL_TABLET | Freq: Four times a day (QID) | ORAL | Status: DC | PRN

## 2024-09-27 MED ORDER — ACETAMINOPHEN 325 MG PO TABS
650.0000 mg | ORAL_TABLET | Freq: Four times a day (QID) | ORAL | Status: DC | PRN
  Administered 2024-09-27 – 2024-09-28 (×3): 650 mg via ORAL
  Filled 2024-09-27 (×3): qty 2

## 2024-09-27 MED ORDER — LIDOCAINE 5 % EX PTCH
1.0000 | MEDICATED_PATCH | CUTANEOUS | Status: DC
  Administered 2024-09-27 – 2024-09-29 (×3): 1 via TRANSDERMAL
  Filled 2024-09-27 (×4): qty 1

## 2024-09-27 MED ORDER — INSULIN ASPART 100 UNIT/ML IJ SOLN
0.0000 [IU] | Freq: Three times a day (TID) | INTRAMUSCULAR | Status: DC
  Administered 2024-09-27: 3 [IU] via SUBCUTANEOUS
  Filled 2024-09-27: qty 3

## 2024-09-27 MED ORDER — ONDANSETRON HCL 4 MG/2ML IJ SOLN: 4.0000 mg | Freq: Four times a day (QID) | INTRAMUSCULAR | Status: DC | PRN

## 2024-09-27 MED ORDER — SODIUM CHLORIDE 0.9 % IV SOLN
100.0000 mg | Freq: Two times a day (BID) | INTRAVENOUS | Status: DC
  Administered 2024-09-27 – 2024-09-28 (×2): 100 mg via INTRAVENOUS
  Filled 2024-09-27 (×3): qty 100

## 2024-09-27 MED ORDER — SODIUM CHLORIDE 0.9% FLUSH
3.0000 mL | Freq: Two times a day (BID) | INTRAVENOUS | Status: DC
  Administered 2024-09-27 – 2024-09-29 (×4): 3 mL via INTRAVENOUS

## 2024-09-27 MED ORDER — EZETIMIBE 10 MG PO TABS
10.0000 mg | ORAL_TABLET | Freq: Every day | ORAL | Status: DC
  Administered 2024-09-27 – 2024-09-29 (×3): 10 mg via ORAL
  Filled 2024-09-27 (×3): qty 1

## 2024-09-27 MED ORDER — MENTHOL 3 MG MT LOZG: 1.0000 | LOZENGE | OROMUCOSAL | Status: DC | PRN

## 2024-09-27 MED ORDER — LOSARTAN POTASSIUM 50 MG PO TABS
50.0000 mg | ORAL_TABLET | Freq: Every day | ORAL | Status: DC
  Administered 2024-09-27 – 2024-09-29 (×3): 50 mg via ORAL
  Filled 2024-09-27 (×3): qty 1

## 2024-09-27 MED ORDER — APIXABAN 5 MG PO TABS
5.0000 mg | ORAL_TABLET | Freq: Two times a day (BID) | ORAL | Status: DC
  Administered 2024-09-27 – 2024-09-29 (×5): 5 mg via ORAL
  Filled 2024-09-27 (×5): qty 1

## 2024-09-27 MED ORDER — INSULIN PUMP
Freq: Three times a day (TID) | SUBCUTANEOUS | Status: DC
  Administered 2024-09-28: 17:00:00 0.14 via SUBCUTANEOUS
  Filled 2024-09-27: qty 1

## 2024-09-27 MED ORDER — IOHEXOL 350 MG/ML SOLN
75.0000 mL | Freq: Once | INTRAVENOUS | Status: AC | PRN
  Administered 2024-09-27: 75 mL via INTRAVENOUS

## 2024-09-27 MED ORDER — ACETAMINOPHEN 650 MG RE SUPP: 650.0000 mg | Freq: Four times a day (QID) | RECTAL | Status: DC | PRN

## 2024-09-27 NOTE — Progress Notes (Signed)
 Patient spiking fever of 103 F, will do sepsis workup with blood culture and lactic acid. Cover for CAP with doxycycline  and send for atypical PNA workup.

## 2024-09-27 NOTE — Progress Notes (Signed)
 The patient's CBG at 2200 was 161 per patient's insulin  pump.

## 2024-09-27 NOTE — Progress Notes (Signed)
° °  Brief Progress Note   _____________________________________________________________________________________________________________  Patient Name: Gina [name] Patient DOB: [birth year] Date: @TODAY @      Data: Reviewed vital signs, labs, and clinical notes.    Action: No action required at this time.    Response:  Telemetry monitoring x 24 hrs per attending's note.  _____________________________________________________________________________________________________________  [facility] RN Expeditor Susie Pousson S Janyce Ellinger Please contact us  directly via secure chat (search for [facility]) or by calling us  at [phone] [facility]).

## 2024-09-27 NOTE — H&P (Signed)
 History and Physical    Gina [name] FMW:[id number] DOB: 08-Mar-1946 DOA: 09/27/2024  PCP: Kayla Jeoffrey RAMAN, FNP (Confirm with patient/family/NH records and if not entered, this has to be entered at [facility] point of entry) Patient coming from: Home  I have personally briefly reviewed patient's old medical records in [facility]  Chief Complaint: I passed out  HPI: Gina [name] is a 78 y.o. 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  Patient and her husband started to have sore throat and dry cough 2 days ago has been using OTC cough syrups.  Last night, patient woke up around 3 AM and complaining  not feeling well, husband woke up and saw the patient was not responsive and called her daughter again.  Soon patient passed out and slumped on the bed, daughter entered the room and found patient diffusely sweating, breathing fast and eyes are closed unresponsive.  According to daughter her pulses are widely palpable.  Daughter did about 10 rounds of chest compressions and patient woke up immediately.  Initially daughter also felt the patient both arms were rigid but did not notice any seizure-like movement.  No tongue biting no loss control urine or bowel movement.  EMS arrived and found patient awake and glucose level more than 200.  O2 saturation 80% blood pressure 120/60 and patient was placed on nonrebreather with O2 saturation improved to 94%.  ED Course: Afebrile, no tachycardia blood pressure 140/50 O2 saturation 100% on room air.  Chest x-ray showed no acute findings.  EKG sinus rhythm no acute ST changes no acute PR or QTc changes.  Blood work showed WBC 7.2 hemoglobin 12.5 K4.4 BUN 13 creatinine 0.8 glucose 215 bicarb 25.  Review of Systems: As per HPI otherwise 14 point review of systems negative.    Past Medical History:  Diagnosis Date   Anxiety    new dx    Atrial fibrillation (HCC)    a. 03/2015 s/p RFCA;  b. CHA2DS2VASc = 5-->Eliquis .   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 12/2014 - due to AF RVR;  b. 03/2015 Echo: EF 60-65%, no rwma.   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 Her smoking use included cigarettes. She has never used smokeless tobacco. She reports that she does not drink alcohol and does not use drugs.  Allergies[1]  Family History  Problem Relation Age of Onset   Breast cancer Mother 67   Heart failure Mother    Prostate cancer Maternal Uncle        died in his 62s   Heart attack Father    Diabetes Maternal Uncle 15     Prior to Admission medications  Medication Sig Start Date End Date Taking? Authorizing Provider  apixaban  (ELIQUIS ) 5 MG TABS tablet Take 1 tablet (5 mg total) by mouth 2  (two) times daily. 09/24/24  Yes Terra Fairy PARAS, PA-C  ezetimibe  (ZETIA ) 10 MG tablet Take 10 mg by mouth daily. 02/19/24  Yes [provider]  insulin  aspart (NOVOLOG ) 100 UNIT/ML injection USE MAX OF 60 UNITS DAILY VIA INSULIN  PUMP. 01/08/24  Yes Thapa, [place], MD  losartan  (COZAAR ) 50 MG tablet Take 1 tablet (50 mg total) by mouth daily. 09/17/24  Yes Kayla Gauze S, FNP  amLODipine  (NORVASC ) 10 MG tablet Take 1 tablet (10 mg total) by mouth daily. Patient not taking: Reported on 09/27/2024 09/21/24 12/20/24  Azobou Donley Joelle DEL, MD  Continuous Glucose Sensor (DEXCOM G7 SENSOR) MISC by Does not apply route.    [provider]  glucose blood (ONE TOUCH ULTRA TEST) test strip Use asto check blood sugar 5 times a day 11/07/16   [place], MD  thyroid  (ARMOUR THYROID ) 15 MG tablet TAKE 1 TABLET DAILY WITH THE 30 MG TABLET (TOTAL OF 45 MG DAILY) Patient not taking: Reported on 09/27/2024 09/15/24   Thapa, [place], MD  thyroid  (ARMOUR THYROID ) 30 MG tablet TAKE ONE TABLET DAILY ALONG WITH THE 15 MG TABLET. (TOTAL DOSAGE OF 45 MG DAILY) Patient not taking: Reported on 09/27/2024 03/19/24   Thapa, [place], MD    Physical Exam: Vitals:   09/27/24 1000 09/27/24 1005 09/27/24 1013 09/27/24 1014  BP: (!) 144/56     Pulse: 94 91 89 91  Resp:  19 18 19   Temp:      TempSrc:      SpO2: 100% 96% 97% 97%  Weight:      Height:        Constitutional: NAD, calm, comfortable Vitals:   09/27/24 1000 09/27/24 1005 09/27/24 1013 09/27/24 1014  BP: (!) 144/56     Pulse: 94 91 89 91  Resp:  19 18 19   Temp:      TempSrc:      SpO2: 100% 96% 97% 97%  Weight:      Height:       Eyes: PERRL, lids and conjunctivae normal ENMT: Mucous membranes are moist. Posterior pharynx erythematous but no swelling no exudate or lesions.Normal dentition.  Neck: normal, supple, no masses, no thyromegaly Respiratory: clear to auscultation bilaterally, no wheezing, no crackles. Normal respiratory effort. No  accessory muscle use.  Cardiovascular: Regular rate and rhythm, no murmurs / rubs / gallops. No extremity edema. 2+ pedal pulses. No carotid bruits.  Abdomen: no tenderness, no masses palpated. No hepatosplenomegaly. Bowel sounds positive.  Musculoskeletal: no clubbing / cyanosis. No joint deformity upper and lower extremities. Good ROM, no contractures. Normal muscle tone.  Skin: no rashes, lesions, ulcers. No induration Neurologic: CN 2-12 grossly intact. Sensation intact, DTR normal. Strength 5/5 in all 4.  Psychiatric: Normal judgment and insight. Alert and oriented x 3. Normal mood.  Labs on Admission: I have personally reviewed following labs and imaging studies  CBC: Recent Labs  Lab 09/27/24 0509  WBC 7.2  NEUTROABS 6.1  HGB 12.5  HCT 38.8  MCV 87.8  PLT 203   Basic Metabolic Panel: Recent Labs  Lab 09/27/24 0509  NA 134*  K 4.4  CL 96*  CO2 25  GLUCOSE 215*  BUN 13  CREATININE 0.80  CALCIUM  8.9  MG 2.1   GFR: Estimated Creatinine Clearance: 52.3 mL/min (by C-G formula based on SCr of 0.8 mg/dL). Liver Function Tests: Recent Labs  Lab 09/27/24 0509  AST 26  ALT 20  ALKPHOS 76  BILITOT 0.8  PROT 6.6  ALBUMIN 4.0   No results for input(s): LIPASE, AMYLASE in the last 168 hours. No results for input(s): AMMONIA in the last 168 hours. Coagulation Profile: No results for input(s): INR, PROTIME in the last 168 hours. Cardiac Enzymes: No results for input(s): CKTOTAL, CKMB, CKMBINDEX, TROPONINI in the last 168 hours. BNP (last 3 results) Recent Labs    09/27/24 0509  PROBNP 719.0*   HbA1C: No results for input(s): HGBA1C in the last 72 hours. CBG: Recent Labs  Lab 09/27/24 0500  GLUCAP 216*   Lipid Profile: No results for input(s): CHOL, HDL, LDLCALC, TRIG, CHOLHDL, LDLDIRECT in the last 72 hours. Thyroid  Function Tests: Recent Labs    09/27/24 0509  TSH 2.350   Anemia Panel: No results for input(s):  VITAMINB12, FOLATE, FERRITIN, TIBC, IRON, RETICCTPCT in the last 72 hours. Urine analysis:    Component Value Date/Time   COLORURINE YELLOW (A) 09/27/2024 0630   APPEARANCEUR HAZY (A) 09/27/2024 0630   LABSPEC 1.017 09/27/2024 0630   PHURINE 5.0 09/27/2024 0630   GLUCOSEU NEGATIVE 09/27/2024 0630   GLUCOSEU NEGATIVE 06/06/2020 0906   HGBUR NEGATIVE 09/27/2024 0630   BILIRUBINUR NEGATIVE 09/27/2024 0630   KETONESUR 20 (A) 09/27/2024 0630   PROTEINUR 100 (A) 09/27/2024 0630   UROBILINOGEN 0.2 06/06/2020 0906   NITRITE NEGATIVE 09/27/2024 0630   LEUKOCYTESUR NEGATIVE 09/27/2024 0630    Radiological Exams on Admission: CT Angio Chest PE W and/or Wo Contrast Result Date: 09/27/2024 CLINICAL DATA:  Syncope and chest pain. Clinical concern for pulmonary embolus. Patient was found down. EXAM: CT ANGIOGRAPHY CHEST WITH CONTRAST TECHNIQUE: Multidetector CT imaging of the chest was performed using the standard protocol during bolus administration of intravenous contrast. Multiplanar CT image reconstructions and MIPs were obtained to evaluate the vascular anatomy. RADIATION DOSE REDUCTION: This exam was performed according to the departmental dose-optimization program which includes automated exposure control, adjustment of the mA and/or kV according to patient size and/or use of iterative reconstruction technique. CONTRAST:  75mL OMNIPAQUE  IOHEXOL  350 MG/ML SOLN COMPARISON:  01/13/2015 FINDINGS: Cardiovascular: The heart size is upper normal to borderline enlarged. No substantial pericardial effusion. Coronary artery calcification is evident. Mild atherosclerotic calcification is noted in the wall of the thoracic aorta. There is no filling defect within the opacified pulmonary arteries to suggest the presence of an acute pulmonary embolus. Mediastinum/Nodes: Upper normal prevascular lymph nodes are stable. 14 mm short axis AP window lymph node was not definitely seen previously. Similar 9 mm  short axis subcarinal node. No hilar lymphadenopathy. The esophagus has normal imaging features. There is no axillary lymphadenopathy. Lungs/Pleura: Biapical pleuroparenchymal scarring evident. Tiny subpleural nodules in both upper lobes identified measuring up to 5 mm on image 21/6. These are probably atelectasis or scarring related. No overtly suspicious pulmonary nodule or mass. Subpleural reticulation anterior left  upper lobe is compatible with radiation fibrosis in this patient with a history of breast cancer. No focal airspace consolidation. No pneumothorax. No pleural effusion. Upper Abdomen: Visualized portion of the upper abdomen shows no acute findings. Musculoskeletal: No worrisome lytic or sclerotic osseous abnormality. No evidence for rib fracture. Focal cortical buckling identified in the anterior cortex of the mid sternum, finding not present on the previous exam. Although no discrete fracture line is evident, given the history of CPR, nondisplaced or incomplete sternal fracture would be a consideration. Review of the MIP images confirms the above findings. IMPRESSION: 1. No CT evidence for acute pulmonary embolus. 2. Focal cortical buckling in the anterior cortex of the mid sternum, finding not present on the previous exam. Although no discrete fracture line is evident, given the history of CPR, nondisplaced or incomplete sternal fracture would be a consideration. 3. Tiny subpleural nodules in both upper lobes measuring up to 5 mm. These are probably atelectasis or scarring related. 4. 14 mm short axis AP window lymph node was not definitely seen previously. This is likely reactive. Follow-up CT chest in 3 months recommended to ensure stability. 5.  Aortic Atherosclerosis (ICD10-I70.0). Electronically Signed   By: Camellia Candle M.D.   On: 09/27/2024 07:02   CT HEAD WO CONTRAST ( ) Result Date: 09/27/2024 EXAM: CT HEAD WITHOUT 09/27/2024 06:52:52 AM TECHNIQUE: CT of the head was performed  without the administration of intravenous contrast. Automated exposure control, iterative reconstruction, and/or weight based adjustment of the mA/kV was utilized to reduce the radiation dose to as low as reasonably achievable. COMPARISON: Brain MRI and Head CT dated 02/11/2022. CLINICAL HISTORY: 78 year old female with syncope on apixaban , evaluation for intracranial hemorrhage. Found unresponsive status post cardiopulmonary resuscitation. FINDINGS: BRAIN AND VENTRICLES: No acute intracranial hemorrhage. No mass effect or midline shift. No extra-axial fluid collection. No evidence of acute infarct. No hydrocephalus. Brain volume is stable, within normal limits for age. Cerebral sulci and gray white differentiation are preserved, stable. Chronic lacunar infarct in the left thalamus. No suspicious intracranial vascular hyperdensity. Mild for age calcified atherosclerosis at the skull base. ORBITS: No acute abnormality. SINUSES AND MASTOIDS: Paranasal sinuses, tympanic cavities and mastoids remain clear. SOFT TISSUES AND SKULL: No acute skull fracture. No acute soft tissue abnormality. IMPRESSION: 1. No acute intracranial abnormality. 2. Stable non-contrast CT appearance of chronic small vessel disease. Electronically signed by: Helayne Hurst MD 09/27/2024 06:57 AM EST RP Workstation: [id number]   DG Chest Portable 1 View Result Date: 09/27/2024 CLINICAL DATA:  Unresponsiveness.  Status post CPR. EXAM: PORTABLE CHEST 1 VIEW COMPARISON:  09/13/2024 FINDINGS: The cardio pericardial silhouette is enlarged. Mild vascular congestion without edema. No focal consolidation or pleural effusion. No pneumothorax. Telemetry leads overlie the chest. IMPRESSION: Enlargement of the cardiopericardial silhouette with mild vascular congestion. Electronically Signed   By: Camellia Candle M.D.   On: 09/27/2024 05:45    EKG: Independently reviewed.  Sinus rhythm, no acute ST changes, no acute PR or QTc interval  changes.  Assessment/Plan Principal Problem:   Syncope Active Problems:   Syncope, vasovagal  (please populate well all problems here in Problem List. (For example, if patient is on BP meds at home and you resume or decide to hold them, it is a problem that needs to be her. Same for CAD, COPD, HLD and so on)  Syncope - Clinically appears to be a vasovagal syncope probably secondary to URI symptoms and repeated cough. - Given that patient also has had 2 days of  on no OTC cough syrups along with her baseline history of PAF, arrhythmia cannot be ruled out.  Review of telemonitoring record on ED showed persistent normal sinus rhythm.  Plan to continue telemonitoring x 24 hours. - Echocardiogram - Orthostatic vital signs - Other DDx, low suspicion for seizure.  Hold off further imaging and EEG study.  UA negative for UTI.  Chest x-ray negative for pneumonia.  Will check respiratory viral panel.  CTA negative for PE.  PAF - Sinus rhythm - Continue Eliquis    HTN - Stable, continue losartan   IDDM - Insulin  pump  DVT prophylaxis: Eliquis  Code Status: Full code Family Communication: Husband and daughter at bedside Disposition Plan: Expect less than 2 midnight hospital stay Consults called: None Admission status: Telemetry observation   Cort ONEIDA Mana MD [facility] Pager [phone]  09/27/2024, 11:37 AM       [1]  Allergies Allergen Reactions   Rosuvastatin  Calcium      Headaches, fatigue   Rosuvastatin  Calcium  Other (See Comments)    Headaches, fatigue

## 2024-09-27 NOTE — Progress Notes (Signed)
 PT Cancellation Note  Patient Details Name: Gina [name] MRN: [id number] DOB: 1946-02-07   Cancelled Treatment:    Reason Eval/Treat Not Completed: Medical issues which prohibited therapy Chart reviewed, spoke with nurse currently working directly with PT.  She reports that pt has a very elevated temp (103), is very weak and not feeling well.  Asks to hold PT today and try back tomorrow.  Will maintain on PT caseload and attempt to see as appropriate.   Carmin JONELLE Deed 09/27/2024, 4:17 PM

## 2024-09-27 NOTE — ED Notes (Signed)
Patient helped to restroom

## 2024-09-27 NOTE — ED Notes (Signed)
 Patient did not have insulin  pump at arrival. Son in law brought insulin  pump and is attached to patient.

## 2024-09-27 NOTE — ED Notes (Signed)
 This tech found  that pt experienced incontinence at this time. Peri-care provided, new brief and chuck placed. Pt is dry and comfortable at this time

## 2024-09-27 NOTE — ED Notes (Signed)
 MD Zhang sent secure chat about patients insulin  pump. MD made aware patient and family are unsure how much insulin  she gets from pump connected to her. Blood sugar reading still high. MD ordering sliding scale at this time.  This RN has conversation on phone with patients daughter about insulin  sliding scale. Daughter gave permission to give insuline per sliding scale.

## 2024-09-27 NOTE — ED Provider Notes (Signed)
 [facility] Provider Note    Event Date/Time   First MD Initiated Contact with Patient 09/27/24 [phone]     (approximate)   History   Loss of Consciousness   HPI  Gina [name] is a 78 y.o. female who presents to the ED for evaluation of Loss of Consciousness   I reviewed the cardiology clinic visit from 6 days ago.  History of paroxysmal A-fib on Eliquis  s/p 2016 ablation, DM, HLD, HTN  Patient presents to the ED from home by EMS for evaluation of a syncopal episode.  History is somewhat limited on arrival to the ED.  Patient has no recollection of preceding events, she reports some mild chest discomfort.   EMS reports that patient was at home, sitting upright on edge of her bed when she ended up on the floor and was found unresponsive.  911 told family to initiate CPR and daughter performed a couple minutes of CPR.    EMS found her unresponsive but with intact pulses.  They provided supplemental oxygen and after few minutes she regained consciousness.  Alert and oriented since that time but no recollection of the events.  Initially asymptomatic but now developing some chest discomfort, presumably from CPR.  After family later arrives, her husband tells me that she was normal yesterday, went to sleep normally.  Around 3 AM he woke up and she was sitting upright on the edge of the bed and reports not feeling well before he witnessed her sliding down to the floor and experiencing syncope.   Physical Exam   Triage Vital Signs: ED Triage Vitals  Encounter Vitals Group     BP      Girls Systolic BP Percentile      Girls Diastolic BP Percentile      Boys Systolic BP Percentile      Boys Diastolic BP Percentile      Pulse      Resp      Temp      Temp src      SpO2      Weight      Height      Head Circumference      Peak Flow      Pain Score      Pain Loc      Pain Education      Exclude from Growth Chart     Most recent vital signs: Vitals:    09/27/24 0501 09/27/24 0503  BP:  (!) 144/56  Pulse: 96   Resp: 18   Temp: 98.5 F (36.9 C)   SpO2: 100%     General: Awake, no distress.  Disoriented but pleasant, conversational, following commands. CV:  Good peripheral perfusion.  Resp:  Normal effort.  Abd:  No distention.  Soft and nontender MSK:  No deformity noted.  Mild anterior sternal tenderness  Neuro:  No focal deficits appreciated. Other:     ED Results / Procedures / Treatments   Labs (all labs ordered are listed, but only abnormal results are displayed) Labs Reviewed  COMPREHENSIVE METABOLIC PANEL WITH GFR - Abnormal; Notable for the following components:      Result Value   Sodium 134 (*)    Chloride 96 (*)    Glucose, Bld 215 (*)    All other components within normal limits  CBC WITH DIFFERENTIAL/PLATELET - Abnormal; Notable for the following components:   Lymphs Abs 0.4 (*)    All other components within normal limits  URINALYSIS, ROUTINE W REFLEX MICROSCOPIC - Abnormal; Notable for the following components:   Color, Urine YELLOW (*)    APPearance HAZY (*)    Ketones, ur 20 (*)    Protein, ur 100 (*)    All other components within normal limits  PRO BRAIN NATRIURETIC PEPTIDE - Abnormal; Notable for the following components:   Pro Brain Natriuretic Peptide 719.0 (*)    All other components within normal limits  D-DIMER, QUANTITATIVE - Abnormal; Notable for the following components:   D-Dimer, Quant 2.10 (*)    All other components within normal limits  CBG MONITORING, ED - Abnormal; Notable for the following components:   Glucose-Capillary 216 (*)    All other components within normal limits  MAGNESIUM   TROPONIN T, HIGH SENSITIVITY  TROPONIN T, HIGH SENSITIVITY    EKG Sinus rhythm with a rate of 92 bpm.  Normal axis and intervals.  Nonspecific ST changes with subtle ST depressions to inferior and lateral leads, mild elevation to aVR.  RADIOLOGY CT head interpreted by me without evidence of  acute intracranial pathology CT chest interpreted by me without signs of [name] CXR interpreted by me without evidence of acute cardiopulmonary pathology.  Official radiology report(s): CT Angio Chest [name] W and/or Wo Contrast Result Date: 09/27/2024 CLINICAL DATA:  Syncope and chest pain. Clinical concern for pulmonary embolus. Patient was found down. EXAM: CT ANGIOGRAPHY CHEST WITH CONTRAST TECHNIQUE: Multidetector CT imaging of the chest was performed using the standard protocol during bolus administration of intravenous contrast. Multiplanar CT image reconstructions and MIPs were obtained to evaluate the vascular anatomy. RADIATION DOSE REDUCTION: This exam was performed according to the departmental dose-optimization program which includes automated exposure control, adjustment of the mA and/or kV according to patient size and/or use of iterative reconstruction technique. CONTRAST:  75mL OMNIPAQUE  IOHEXOL  350 MG/ML SOLN COMPARISON:  01/13/2015 FINDINGS: Cardiovascular: The heart size is upper normal to borderline enlarged. No substantial pericardial effusion. Coronary artery calcification is evident. Mild atherosclerotic calcification is noted in the wall of the thoracic aorta. There is no filling defect within the opacified pulmonary arteries to suggest the presence of an acute pulmonary embolus. Mediastinum/Nodes: Upper normal prevascular lymph nodes are stable. 14 mm short axis AP window lymph node was not definitely seen previously. Similar 9 mm short axis subcarinal node. No hilar lymphadenopathy. The esophagus has normal imaging features. There is no axillary lymphadenopathy. Lungs/Pleura: Biapical pleuroparenchymal scarring evident. Tiny subpleural nodules in both upper lobes identified measuring up to 5 mm on image 21/6. These are probably atelectasis or scarring related. No overtly suspicious pulmonary nodule or mass. Subpleural reticulation anterior left upper lobe is compatible with radiation  fibrosis in this patient with a history of breast cancer. No focal airspace consolidation. No pneumothorax. No pleural effusion. Upper Abdomen: Visualized portion of the upper abdomen shows no acute findings. Musculoskeletal: No worrisome lytic or sclerotic osseous abnormality. No evidence for rib fracture. Focal cortical buckling identified in the anterior cortex of the mid sternum, finding not present on the previous exam. Although no discrete fracture line is evident, given the history of CPR, nondisplaced or incomplete sternal fracture would be a consideration. Review of the MIP images confirms the above findings. IMPRESSION: 1. No CT evidence for acute pulmonary embolus. 2. Focal cortical buckling in the anterior cortex of the mid sternum, finding not present on the previous exam. Although no discrete fracture line is evident, given the history of CPR, nondisplaced or incomplete sternal fracture would be a consideration.  3. Tiny subpleural nodules in both upper lobes measuring up to 5 mm. These are probably atelectasis or scarring related. 4. 14 mm short axis AP window lymph node was not definitely seen previously. This is likely reactive. Follow-up CT chest in 3 months recommended to ensure stability. 5.  Aortic Atherosclerosis (ICD10-I70.0). Electronically Signed   By: Camellia Candle M.D.   On: 09/27/2024 07:02   CT HEAD WO CONTRAST ( ) Result Date: 09/27/2024 EXAM: CT HEAD WITHOUT 09/27/2024 06:52:52 AM TECHNIQUE: CT of the head was performed without the administration of intravenous contrast. Automated exposure control, iterative reconstruction, and/or weight based adjustment of the mA/kV was utilized to reduce the radiation dose to as low as reasonably achievable. COMPARISON: Brain MRI and Head CT dated 02/11/2022. CLINICAL HISTORY: 78 year old female with syncope on apixaban , evaluation for intracranial hemorrhage. Found unresponsive status post cardiopulmonary resuscitation. FINDINGS: BRAIN AND  VENTRICLES: No acute intracranial hemorrhage. No mass effect or midline shift. No extra-axial fluid collection. No evidence of acute infarct. No hydrocephalus. Brain volume is stable, within normal limits for age. Cerebral sulci and gray white differentiation are preserved, stable. Chronic lacunar infarct in the left thalamus. No suspicious intracranial vascular hyperdensity. Mild for age calcified atherosclerosis at the skull base. ORBITS: No acute abnormality. SINUSES AND MASTOIDS: Paranasal sinuses, tympanic cavities and mastoids remain clear. SOFT TISSUES AND SKULL: No acute skull fracture. No acute soft tissue abnormality. IMPRESSION: 1. No acute intracranial abnormality. 2. Stable non-contrast CT appearance of chronic small vessel disease. Electronically signed by: Helayne Hurst MD 09/27/2024 06:57 AM EST RP Workstation: [id number]   DG Chest Portable 1 View Result Date: 09/27/2024 CLINICAL DATA:  Unresponsiveness.  Status post CPR. EXAM: PORTABLE CHEST 1 VIEW COMPARISON:  09/13/2024 FINDINGS: The cardio pericardial silhouette is enlarged. Mild vascular congestion without edema. No focal consolidation or pleural effusion. No pneumothorax. Telemetry leads overlie the chest. IMPRESSION: Enlargement of the cardiopericardial silhouette with mild vascular congestion. Electronically Signed   By: Camellia Candle M.D.   On: 09/27/2024 05:45    PROCEDURES and INTERVENTIONS:  .1-3 Lead EKG Interpretation  Performed by: Claudene Rover, MD Authorized by: Claudene Rover, MD     Interpretation: normal     ECG rate:  90   ECG rate assessment: normal     Rhythm: sinus rhythm     Ectopy: none     Conduction: normal     Medications  iohexol  (OMNIPAQUE ) 350 MG/ML injection 75 mL (75 mLs Intravenous Contrast Given 09/27/24 0640)     IMPRESSION / MDM / ASSESSMENT AND PLAN / ED COURSE  I reviewed the triage vital signs and the nursing notes.  Differential diagnosis includes, but is not limited to, ICH, [name],  seizure, cardiac dysrhythmia  {Patient presents with symptoms of an acute illness or injury that is potentially life-threatening.  Anticoagulated patient presents from home after a syncopal episode and period of unresponsiveness.  At the time she arrived to the ED she is back to baseline with a generally reassuring exam, though amnestic of the events leading up to this.  EKG with some nonspecific changes but no STEMI.  No dysrhythmias on the monitor.  Mild hyperglycemia, normal CBC, first troponin negative.  Reassuring CT imaging.  Signed out to oncoming physician to follow-up on second troponin and anticipate medical admission for observation for syncope.  Clinical Course as of 09/27/24 0711  Sun Sep 27, 2024  0524 Reassessed.  Clinically the same.  Discussed with daughter and husband out in the hall while patient  getting chest x-ray. [DS]    Clinical Course User Index [DS] Claudene Rover, MD     FINAL CLINICAL IMPRESSION(S) / ED DIAGNOSES   Final diagnoses:  Syncope and collapse     Rx / DC Orders   ED Discharge Orders     None        Note:  This document was prepared using Dragon voice recognition software and may include unintentional dictation errors.   Claudene Rover, MD 09/27/24 [phone]

## 2024-09-27 NOTE — ED Notes (Signed)
 Pt placed on bedpan but unable to urinate. Pt is now off of bedpan with no further needs at this time.

## 2024-09-27 NOTE — ED Triage Notes (Addendum)
 Found by husband beside bed unresponsive. Daughter did 1 min of CPR by 911 direction. EMS initial vitals of o2 80 with BP 100/62. EMS placed NPA o2 92 with and A+O x4, complaining to ems of chest soreness. CBG 242,  Hx of DM with insulin  by pump. breast cancer eliquis , and here two weaks ago for headache. Insulin  pump is at home, disconnected by EMS and left on dresser communicated to daughter.

## 2024-09-28 DIAGNOSIS — J101 Influenza due to other identified influenza virus with other respiratory manifestations: Secondary | ICD-10-CM | POA: Insufficient documentation

## 2024-09-28 LAB — GLUCOSE, CAPILLARY
Glucose-Capillary: 140 mg/dL — ABNORMAL HIGH (ref 70–99)
Glucose-Capillary: 120 mg/dL — ABNORMAL HIGH (ref 70–99)
Glucose-Capillary: 193 mg/dL — ABNORMAL HIGH (ref 70–99)
Glucose-Capillary: 152 mg/dL — ABNORMAL HIGH (ref 70–99)
Glucose-Capillary: 180 mg/dL — ABNORMAL HIGH (ref 70–99)

## 2024-09-28 MED ORDER — ORAL CARE MOUTH RINSE: 15.0000 mL | OROMUCOSAL | Status: DC | PRN

## 2024-09-28 MED ORDER — OSELTAMIVIR PHOSPHATE 30 MG PO CAPS
30.0000 mg | ORAL_CAPSULE | Freq: Two times a day (BID) | ORAL | Status: DC
  Administered 2024-09-28 – 2024-09-29 (×3): 30 mg via ORAL
  Filled 2024-09-28 (×4): qty 1

## 2024-09-28 NOTE — Progress Notes (Signed)
°  Progress Note   Patient: Gina [name] FMW:[id number] DOB: 1946/08/16 DOA: 09/27/2024     0 DOS: the patient was seen and examined on 09/28/2024   Brief hospital course: Gina [name] is a 78 y.o. 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  Per patient daughter, patient became unresponsive after syncope, she was directed by paramedics to start chest compressions, she was compressed x10, patient woke up right away. Upon arriving to hospital, patient had a high fever, positive for influenza.   Principal Problem:   Syncope Active Problems:   Essential hypertension   Diabetes type 1, controlled (HCC)   Hypothyroidism   Syncope, vasovagal   Paroxysmal atrial fibrillation (HCC)   Assessment and Plan: Syncope and collapse. Patient has no history of syncope in the past, no recent GI symptoms.  But she is positive for influenza, which may have contributed to syncope episode. Patient was started on CPR at home by daughter, not sure patient had a true cardiac arrest.  I will continue telemetry monitoring.  Reviewed EKG, patient has sinus rhythm, QTc 447.  I will continue monitor with telemetry, assess with echocardiogram to evaluate ejection fraction.  Also check orthostatic vital signs. CT angiogram did not show a PE. Patient probably can be discharged home tomorrow.  Influenza A infection. Patient spiked a fever, chest CT scan did not show pneumonia.  Type 1 diabetes with hyperglycemia. Patient currently on insulin  pump, discussed with diabetes coordinator, and daughter, patient will be allowed to continue on insulin  pump while in the hospital.  Paroxysmal atrial fibrillation. Currently in sinus rhythm, continue anticoagulation.  Essential hypertension. Blood pressure running high, restarted losartan .      Subjective:  Patient doing well today, no additional  fever.  No dizziness.  No shortness of breath.  Physical Exam: Vitals:   09/27/24 2034 09/28/24 0103 09/28/24 0500 09/28/24 0506  BP:  (!) 164/68  (!) 171/70  Pulse:  81  75  Resp:  15  15  Temp:  100.1 F (37.8 C)  98.7 F (37.1 C)  TempSrc:  Oral  Oral  SpO2:    95%  Weight: 66.6 kg  70.5 kg   Height: 5' 5 (1.651 m)      General exam: Appears calm and comfortable  Respiratory system: Clear to auscultation. Respiratory effort normal. Cardiovascular system: S1 & S2 heard, RRR. No JVD, murmurs, rubs, gallops or clicks. No pedal edema. Gastrointestinal system: Abdomen is nondistended, soft and nontender. No organomegaly or masses felt. Normal bowel sounds heard. Central nervous system: Alert and oriented. No focal neurological deficits. Extremities: Symmetric 5 x 5 power. Skin: No rashes, lesions or ulcers Psychiatry: Judgement and insight appear normal. Mood & affect appropriate.    Data Reviewed:  Reviewed CT scan results and lab results.  Family Communication: Daughter updated at the bedside.  Disposition: Status is: Observation      Time spent: 35 minutes  Author: Murvin Mana, MD 09/28/2024 11:36 AM  For on call review [email or web address].

## 2024-09-28 NOTE — Plan of Care (Signed)

## 2024-09-28 NOTE — Progress Notes (Signed)
 This clinical research associate notified Dr. Lawence about CCMD's call. No new orders. Tylenol  given for fever 100.5. The patient refused to wear oxygen to keep O2 sats above 92%.

## 2024-09-28 NOTE — Progress Notes (Signed)
 CCMD called this clinical research associate and said that the patient had 8 runs of SVT with a HR of 151. This clinical research associate assessed the patient, asymptomatic. HR currently 93. The patient has a history of A Fib.

## 2024-09-28 NOTE — Care Management Obs Status (Signed)
 MEDICARE OBSERVATION STATUS NOTIFICATION   Patient Details  Name: Gina [name] MRN: [id number] Date of Birth: [birth year]   Medicare Observation Status Notification Given:  Chaney BRANDY CHRISTIANE LELON, CMA 09/28/2024, 1:27 PM

## 2024-09-28 NOTE — Hospital Course (Signed)
 Gina [name] is a 78 y.o. 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  Per patient daughter, patient became unresponsive after syncope, she was directed by paramedics to start chest compressions, she was compressed x10, patient woke up right away. Upon arriving to hospital, patient had a high fever, positive for influenza. Patient is treated with Tamiflu . Telemetry did not show any arrhythmia, echocardiogram showed ejection fraction 65 to 70%, grade 2 diastolic dysfunction.  Mild to moderate mitral regurgitation.  Patient is symptomatically improved, medically stable for discharge.   Had ZIO recorder set up, will follow-up with cardiology for results.

## 2024-09-28 NOTE — Evaluation (Addendum)
 Physical Therapy Evaluation Patient Details Name: Gina [name] MRN: [id number] DOB: 1946-08-24 Today's Date: 09/28/2024  History of Present Illness  Gina [name] is an 78 y.o. female who presented to ED for c/o syncope on 09/27/24. PMH significant for HTN, DM, Afib.  Clinical Impression  Pt is a pleasant 78 y.o. female admitted for the above. Pt greeted in semi-supine with daughter at bedside. Pt able to complete bed mobility with mod I and required min A during STS to RW for steadiness as she donned mesh panties. Pt ambulated 349ft total with the last 265ft completed without AD and 1HHA prn. While pt is mobilizing safely, she reports she has not yet met her baseline status. Plan to complete stairs at next date. She will continue to benefit from skilled PT services to address her reduction in functional strength and mobility following her hospital stay.        If plan is discharge home, recommend the following: A little help with walking and/or transfers;A little help with bathing/dressing/bathroom   Can travel by private vehicle        Equipment Recommendations None recommended by PT  Recommendations for Other Services       Functional Status Assessment Patient has had a recent decline in their functional status and demonstrates the ability to make significant improvements in function in a reasonable and predictable amount of time.     Precautions / Restrictions Precautions Precautions: None Restrictions Weight Bearing Restrictions Per Provider Order: No      Mobility  Bed Mobility Overal bed mobility: Modified Independent             General bed mobility comments: increased time to complete    Transfers Overall transfer level: Needs assistance Equipment used: Rolling walker (2 wheels) Transfers: Sit to/from Stand Sit to Stand: Min assist           General transfer comment: min A for steadiness while pt pulled up mesh panties     Ambulation/Gait Ambulation/Gait assistance: Contact guard assist Gait Distance (Feet): 350 Feet Assistive device: Rolling walker (2 wheels) Gait Pattern/deviations: Step-through pattern       General Gait Details: ambulated the last 242ft without RW, 1 HHA prn  Stairs            Wheelchair Mobility     Tilt Bed    Modified Rankin (Stroke Patients Only)       Balance Overall balance assessment: Needs assistance Sitting-balance support: Feet supported, No upper extremity supported Sitting balance-Leahy Scale: Good     Standing balance support: Single extremity supported, During functional activity Standing balance-Leahy Scale: Fair Standing balance comment: able to amb with 1 HHA with slower speed                             Pertinent Vitals/Pain Pain Assessment Pain Assessment: Faces Faces Pain Scale: Hurts a little bit Pain Location: sternum d/t CPR given by daughter at time of syncope Pain Descriptors / Indicators: Discomfort Pain Intervention(s): Monitored during session    Home Living Family/patient expects to be discharged to:: Private residence Living Arrangements: Spouse/significant other Available Help at Discharge: Family Type of Home: House Home Access: Stairs to enter Entrance Stairs-Rails: Left Entrance Stairs-Number of Steps: 3   Home Layout: One level   Additional Comments: [place] daughter available prn    Prior Function Prior Level of Function : Independent/Modified Independent  Mobility Comments: IND and active at baseline ADLs Comments: IND at baseline     Extremity/Trunk Assessment   Upper Extremity Assessment Upper Extremity Assessment: Overall WFL for tasks assessed    Lower Extremity Assessment Lower Extremity Assessment: Overall WFL for tasks assessed    Cervical / Trunk Assessment Cervical / Trunk Assessment: Kyphotic  Communication   Communication Communication:  No apparent difficulties    Cognition Arousal: Alert Behavior During Therapy: WFL for tasks assessed/performed   PT - Cognitive impairments: No apparent impairments                         Following commands: Impaired Following commands impaired: Follows one step commands with increased time     Cueing Cueing Techniques: Verbal cues     General Comments      Exercises     Assessment/Plan    PT Assessment Patient needs continued PT services  PT Problem List Decreased strength;Decreased activity tolerance;Decreased balance;Decreased mobility;Decreased coordination       PT Treatment Interventions Gait training;Stair training;Functional mobility training;Therapeutic activities;Therapeutic exercise;Balance training    PT Goals (Current goals can be found in the Care Plan section)  Acute Rehab PT Goals Patient Stated Goal: none stated PT Goal Formulation: With patient Time For Goal Achievement: 10/12/24 Potential to Achieve Goals: Good    Frequency Min 2X/week     Co-evaluation               AM-PAC PT 6 Clicks Mobility  Outcome Measure Help needed turning from your back to your side while in a flat bed without using bedrails?: None Help needed moving from lying on your back to sitting on the side of a flat bed without using bedrails?: None Help needed moving to and from a bed to a chair (including a wheelchair)?: None Help needed standing up from a chair using your arms (e.g., wheelchair or bedside chair)?: A Little Help needed to walk in hospital room?: A Little Help needed climbing 3-5 steps with a railing? : A Little 6 Click Score: 21    End of Session Equipment Utilized During Treatment: Gait belt Activity Tolerance: Patient tolerated treatment well Patient left: in chair;with call bell/phone within reach;with family/visitor present Nurse Communication: Mobility status PT Visit Diagnosis: Unsteadiness on feet (R26.81);Other abnormalities of  gait and mobility (R26.89)    Time: 9091-9069 PT Time Calculation (min) (ACUTE ONLY): 22 min   Charges:                 Allena Bulls, SPT  Maryanne Finder, PT, DPT Physical Therapist - [facility]  [facility] 09/28/2024, 10:22 AM

## 2024-09-28 NOTE — Inpatient Diabetes Management (Signed)
 Inpatient Diabetes Program Recommendations  AACE/ADA: New Consensus Statement on Inpatient Glycemic Control   Target Ranges:  Prepandial:   less than 140 mg/dL      Peak postprandial:   less than 180 mg/dL (1-2 hours)      Critically ill patients:  140 - 180 mg/dL    Latest Reference Range & Units 09/27/24 05:00 09/27/24 11:42 09/27/24 16:30 09/28/24 05:03 09/28/24 08:23  Glucose-Capillary 70 - 99 mg/dL 783 (H) 815 (H) 785 (H) 140 (H) 120 (H)   Review of Glycemic Control  Diabetes history: DM19 (dx at 78 years old) Outpatient Diabetes medications: T-slim insulin  pump with Novolog  insulin  and Dexcom G7 CGM Current orders for Inpatient glycemic control: Insulin  Pump AC&HS and 2am, Novolog  0-9 units TID with meals  Inpatient Diabetes Program Recommendations:    Insulin : Please discontinue Novolog  0-9 units TID with meals since patient is using her insulin  pump for all insulin  needs.  NOTE: Patient admitted with syncope on 09/27/24. In reviewing the chart, noted patient has Type 1 DM and sees Dr. Mercie for DM management. Patient last seen Dr. Mercie on 07/08/24 and per office note the following should be insulin  pump settings:   Tandem t:slim insulin  pump on control IQ using NovoLog  U100.   Insulin  Pump setting:  Basal MN-0.300u/hour 2AM-    0.450  4AM-1.10 6AM-0.90 8AM-    0.90 11:30AM1.00 2PM-     1.050 6PM-     0.700 10PM-   0.300   Bolus CHO Ratio (1unit:CHO) MN-1:8 2PM-1:7 6PM-1:8   Correction/Sensitivity: MN-1:45 2PM-1:40 10PM-1:45   Target: 110   Spoke with patient and her daughter at bedside. Patient has T-Slim insulin  pump attached and using it for DM control. Patient stated that she does not have on a Dexcom G7 sensor currently. She initially stated that she would need to fill her insulin  tubing if she applied a new Dexcom G7 sensor. Discussed that she should not have to change anything out on her insulin  pump in order to apply and start a new Dexcom G7 sensor.  Asked patient to remove the insulin  pump so pump settings could be verified with settings noted by Dr. Mercie on 07/08/24 note. Insulin  pump settings are exactly as noted above.  Patient reconnected insulin  pump. Patient's daughter gave patient a new Dexcom G7 sensor and patient was able to apply a new sensor and got the new sensor connected to her insulin  pump. Patient reports she has not bolused for carbs from breakfast yet but she will plan to do so once glucose is reading from sensor and she eats her grits and fruit on breakfast tray.  Informed patient that while inpatient, nursing staff will continue to do finger sticks for glucose monitoring and our team will follow along while inpatient. Patient appreciative of visit and has no questions or concerns at this time.  Sent chat message to Dr. Laurita and RN to recommend that Novolog  correction scale be discontinued since patient has on her insulin  pump and using it for all insulin  needs.   Thanks, Earnie Gainer, RN, MSN, CDCES Diabetes Coordinator Inpatient Diabetes Program [phone] (Team Pager from 8am to 5pm)

## 2024-09-29 ENCOUNTER — Observation Stay (HOSPITAL_BASED_OUTPATIENT_CLINIC_OR_DEPARTMENT_OTHER)
Admit: 2024-09-29 | Discharge: 2024-09-29 | Disposition: A | Discharge status: Home / Self Care | Attending: Physician Assistant | Admitting: Physician Assistant

## 2024-09-29 ENCOUNTER — Observation Stay: Admit: 2024-09-29 | Discharge: 2024-09-29 | Disposition: A | Attending: Internal Medicine

## 2024-09-29 ENCOUNTER — Other Ambulatory Visit: Payer: Self-pay

## 2024-09-29 DIAGNOSIS — R55 Syncope and collapse: Secondary | ICD-10-CM

## 2024-09-29 DIAGNOSIS — E876 Hypokalemia: Secondary | ICD-10-CM | POA: Insufficient documentation

## 2024-09-29 LAB — ECHOCARDIOGRAM COMPLETE
AR max vel: 2.39 cm2
AV Area VTI: 2.24 cm2
AV Area mean vel: 2.19 cm2
AV Mean grad: 2 mmHg
AV Peak grad: 3.8 mmHg
Ao pk vel: 0.98 m/s
Area-P 1/2: 3.72 cm2
Calc EF: 74.3 %
Height: 65 in
MV VTI: 1.6 cm2
S' Lateral: 1.9 cm
Single Plane A2C EF: 79.9 %
Single Plane A4C EF: 63.6 %
Weight: 2440.93 [oz_av]

## 2024-09-29 LAB — BASIC METABOLIC PANEL WITH GFR
Anion gap: 11 (ref 5–15)
BUN: 10 mg/dL (ref 8–23)
CO2: 24 mmol/L (ref 22–32)
Calcium: 8.1 mg/dL — ABNORMAL LOW (ref 8.9–10.3)
Chloride: 95 mmol/L — ABNORMAL LOW (ref 98–111)
Creatinine, Ser: 0.57 mg/dL (ref 0.44–1.00)
GFR, Estimated: >60 mL/min (ref >60)
Glucose, Bld: 125 mg/dL — ABNORMAL HIGH (ref 70–99)
Potassium: 3.4 mmol/L — ABNORMAL LOW (ref 3.5–5.1)
Sodium: 129 mmol/L — ABNORMAL LOW (ref 135–145)

## 2024-09-29 LAB — MAGNESIUM: Magnesium: 1.9 mg/dL (ref 1.7–2.4)

## 2024-09-29 LAB — GLUCOSE, CAPILLARY
Glucose-Capillary: 147 mg/dL — ABNORMAL HIGH (ref 70–99)
Glucose-Capillary: 125 mg/dL — ABNORMAL HIGH (ref 70–99)
Glucose-Capillary: 134 mg/dL — ABNORMAL HIGH (ref 70–99)
Glucose-Capillary: 195 mg/dL — ABNORMAL HIGH (ref 70–99)
Glucose-Capillary: 124 mg/dL — ABNORMAL HIGH (ref 70–99)

## 2024-09-29 LAB — MYCOPLASMA PNEUMONIAE ANTIBODY, IGM: Mycoplasma pneumo IgM: <770 U/mL (ref 0–769)

## 2024-09-29 MED ORDER — OSELTAMIVIR PHOSPHATE 30 MG PO CAPS
30.0000 mg | ORAL_CAPSULE | Freq: Two times a day (BID) | ORAL | 0 refills | Status: AC
  Filled 2024-09-29: qty 6, 3d supply, fill #0

## 2024-09-29 MED ORDER — SODIUM CHLORIDE 1 G PO TABS
1.0000 g | ORAL_TABLET | Freq: Two times a day (BID) | ORAL | 0 refills | Status: AC
  Filled 2024-09-29: qty 14, 7d supply, fill #0

## 2024-09-29 MED ORDER — BENZONATATE 100 MG PO CAPS
100.0000 mg | ORAL_CAPSULE | Freq: Three times a day (TID) | ORAL | 0 refills | Status: AC | PRN
  Filled 2024-09-29: qty 20, 7d supply, fill #0

## 2024-09-29 MED ORDER — SODIUM CHLORIDE 1 G PO TABS
1.0000 g | ORAL_TABLET | Freq: Two times a day (BID) | ORAL | Status: DC
  Administered 2024-09-29 (×2): 1 g via ORAL
  Filled 2024-09-29 (×2): qty 1

## 2024-09-29 MED ORDER — PERFLUTREN LIPID MICROSPHERE
1.0000 mL | INTRAVENOUS | Status: DC | PRN
  Administered 2024-09-29: 12:00:00 2 mL via INTRAVENOUS

## 2024-09-29 MED ORDER — POTASSIUM CHLORIDE 20 MEQ PO PACK
40.0000 meq | PACK | Freq: Once | ORAL | Status: AC
  Administered 2024-09-29: 11:00:00 40 meq via ORAL
  Filled 2024-09-29: qty 2

## 2024-09-29 NOTE — TOC Initial Note (Signed)
 Transition of Care [facility]) - Initial/Assessment Note    Patient Details  Name: Gina [name] MRN: [id number] Date of Birth: 26-Mar-1946  [facility] CM/SW Contact:    Nathanael CHRISTELLA Ring, RN Phone Number: 09/29/2024, 11:32 AM  Clinical Narrative:                 CM met with patient at the bedside, her 2 daughters are also at the bedside, introduced self and explained role in DC planning.   Patient is from home with her husband.  She is independent in ADL's and requires no DME.  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  Her husband provides her transportation.  PT has recommended OP PT but she and family decline at this time.  I do let them know that if they change their mind after discharge her PCP can always set it up.   No other TOC needs identified at this time.   Expected Discharge Plan: Home/Self Care Barriers to Discharge: Continued Medical Work up   Patient Goals and CMS Choice Patient states their goals for this hospitalization and ongoing recovery are:: Wants to go home          Expected Discharge Plan and Services       Living arrangements for the past 2 months: Single Family Home                 DME Arranged: N/A         HH Arranged: NA          Prior Living Arrangements/Services Living arrangements for the past 2 months: Single Family Home Lives with:: Spouse Patient language and need for interpreter reviewed:: Yes Do you feel safe going back to the place where you live?: Yes      Need for Family Participation in Patient Care: Yes (Comment) Care giver support system in place?: Yes (comment)   Criminal Activity/Legal Involvement Pertinent to Current Situation/Hospitalization: No - Comment as needed  Activities of Daily Living   ADL Screening (condition at time of admission) Independently performs ADLs?: Yes (appropriate for developmental age) Is the patient deaf or have  difficulty hearing?: No Does the patient have difficulty seeing, even when wearing glasses/contacts?: Yes (Left eye post stroke) Does the patient have difficulty concentrating, remembering, or making decisions?: No  Permission Sought/Granted Permission sought to share information with : Family Supports Permission granted to share information with : Yes, Verbal Permission Granted  Share Information with NAME: Ayden Apodaca     Permission granted to share info w Relationship: husband  Permission granted to share info w Contact Information: [phone]  Emotional Assessment Appearance:: Appears stated age Attitude/Demeanor/Rapport: Engaged Affect (typically observed): Accepting Orientation: : Oriented to Self, Oriented to Place, Oriented to  Time, Oriented to Situation Alcohol / Substance Use: Not Applicable Psych Involvement: No (comment)  Admission diagnosis:  Syncope and collapse [R55] Syncope [R55] Persistent atrial fibrillation (HCC) [I48.19] Patient Active Problem List   Diagnosis Date Noted   Hypokalemia 09/29/2024   Influenza A 09/28/2024   Syncope 09/27/2024   History of chest pain 09/17/2024   Encounter for Medicare annual wellness exam 01/09/2024   Atopic dermatitis 12/11/2023   Physical exam, annual 12/11/2023   Hypercoagulable state due to paroxysmal atrial fibrillation (HCC) 09/25/2023   Mixed hyperlipidemia 01/24/2023   CRAO (central retinal artery occlusion), left 02/11/2022   Hyponatremia 02/11/2022   A-fib (  HCC) 04/08/2015   Chest pain    Hypothyroidism 01/12/2015   Chronic anticoagulation-Eliquis  01/12/2015   Typical atrial flutter (HCC)    Pain in the chest    Paroxysmal atrial fibrillation (HCC)    Pyrexia    Atrial fibrillation with RVR (HCC) 01/11/2015   Elevated troponin 01/09/2015   Sepsis due to urinary tract infection (HCC)    Diabetes type 1, controlled (HCC)    Other specified hypotension    Bradycardia    Hypotension 01/08/2015   Sepsis  (HCC) 01/08/2015   Hypothermia 01/08/2015   Diarrhea 01/08/2015   Syncope, vasovagal 01/08/2015   Dehydration 01/08/2015   Tingling 01/08/2015   Anemia 01/08/2015   Thrombocytopenia 01/08/2015   Dyslipidemia 08/07/2013   Essential hypertension 06/25/2013   Anxiety    Breast cancer-hx of radiation Rx 03/05/2012   PCP:  Kayla Jeoffrey RAMAN, FNP Pharmacy:   [facility] [place] [facility] [place], [place] - [address] [place] [address] Phone: [phone] Fax: [phone]  [facility] [address] [place] ([place]), [place] - [address] [place]) [place] [address] Phone: [phone] Fax: [phone]     Social Drivers of Health (SDOH) Social History: SDOH Screenings   Food Insecurity: No Food Insecurity (09/27/2024)  Housing: Low Risk (09/27/2024)  Transportation Needs: No Transportation Needs (09/27/2024)  Utilities: Not At Risk (09/27/2024)  Alcohol Screen: Low Risk (01/09/2024)  Depression (PHQ2-9): Low Risk (09/17/2024)  Financial Resource Strain: Low Risk (01/09/2024)  Physical Activity: Insufficiently Active (01/09/2024)  Social Connections: Socially Integrated (09/27/2024)  Stress: No Stress Concern Present (01/09/2024)  Tobacco Use: Medium Risk (09/27/2024)  Health Literacy: Adequate Health Literacy (01/09/2024)   SDOH Interventions:     Readmission Risk Interventions     No data to display

## 2024-09-29 NOTE — Plan of Care (Signed)

## 2024-09-29 NOTE — Discharge Summary (Signed)
 Physician Discharge Summary   Patient: Gina [name] MRN: [id number] DOB: Mar 27, 1946  Admit date:     09/27/2024  Discharge date: 09/29/2024  Discharge Physician: Murvin Mana   PCP: Kayla Jeoffrey RAMAN, FNP   Recommendations at discharge:   Follow-up with PCP in 1 week. Follow-up with cardiology in 2 weeks. Repeat BMP at next office visit.  Discharge Diagnoses: Principal Problem:   Syncope Active Problems:   Essential hypertension   Diabetes type 1, controlled (HCC)   Hypothyroidism   Hyponatremia   Syncope, vasovagal   Paroxysmal atrial fibrillation (HCC)   Influenza A   Hypokalemia  Resolved Problems:   * No resolved hospital problems. *  Hospital Course: Gina [name] is a 78 y.o. 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  Per patient daughter, patient became unresponsive after syncope, she was directed by paramedics to start chest compressions, she was compressed x10, patient woke up right away. Upon arriving to hospital, patient had a high fever, positive for influenza. Patient is treated with Tamiflu . Telemetry did not show any arrhythmia, echocardiogram showed ejection fraction 65 to 70%, grade 2 diastolic dysfunction.  Mild to moderate mitral regurgitation.  Patient is symptomatically improved, medically stable for discharge.   Had ZIO recorder set up, will follow-up with cardiology for results.  Assessment and Plan: Syncope and collapse. Patient has no history of syncope in the past, no recent GI symptoms.  But she is positive for influenza, which may have contributed to syncope episode. Patient was started on CPR at home by daughter, not sure patient had a true cardiac arrest.   Reviewed EKG, patient has sinus rhythm, QTc 447.   CT angiogram did not show a PE. Orthostatic vital signs did not show significant orthostatic  hypotension. Echocardiogram performed as above, no significant valvular disease. Telemetry did not show any arrhythmia. Patient has set up ZIO recorder before discharge, will follow with cardiology to review his results.   Influenza A infection. Patient spiked a fever, chest CT scan did not show pneumonia. Symptom improving, will continue Tamiflu  for additional 3 days.   Type 1 diabetes with hyperglycemia. Resume insulin  pump.   Paroxysmal atrial fibrillation. Currently in sinus rhythm, continue anticoagulation.  Chronic hyponatremia. Hypokalemia. Potassium is 3.4, give a dose of oral potassium before discharge.  Sodium level 129, reviewed chart, patient has a chronic hyponatremia for years.  I have started sodium tablets 1 g twice a day x 7 days, please follow-up on the BMP at the next office visit.   Essential hypertension. Continue home medicines.       Consultants: None Procedures performed: None  Disposition: Home Diet recommendation:  Discharge Diet Orders (From admission, onward)     Start     Ordered   09/29/24 0000  Diet - low sodium heart healthy        09/29/24 1649           Cardiac diet DISCHARGE MEDICATION: Allergies as of 09/29/2024       Reactions   Rosuvastatin  Calcium     Headaches, fatigue   Rosuvastatin  Calcium  Other (See Comments)   Headaches, fatigue        Medication List     STOP taking these medications    amLODipine  10 MG tablet Commonly known as: NORVASC    thyroid  15 MG tablet Commonly known as: Armour Thyroid    thyroid  30 MG tablet Commonly known as: Armour Thyroid   TAKE these medications    apixaban  5 MG Tabs tablet Commonly known as: Eliquis  Take 1 tablet (5 mg total) by mouth 2 (two) times daily.   benzonatate  100 MG capsule Commonly known as: TESSALON  Take 1 capsule (100 mg total) by mouth 3 (three) times daily as needed for cough.   Dexcom G7 Sensor Misc by Does not apply route.   ezetimibe  10 MG  tablet Commonly known as: ZETIA  Take 10 mg by mouth daily.   glucose blood test strip Commonly known as: ONE TOUCH ULTRA TEST Use asto check blood sugar 5 times a day   insulin  aspart 100 UNIT/ML injection Commonly known as: NovoLOG  USE MAX OF 60 UNITS DAILY VIA INSULIN  PUMP.   losartan  50 MG tablet Commonly known as: COZAAR  Take 1 tablet (50 mg total) by mouth daily.   oseltamivir  30 MG capsule Commonly known as: TAMIFLU  Take 1 capsule (30 mg total) by mouth 2 (two) times daily for 3 days.   sodium chloride  1 g tablet Take 1 tablet (1 g total) by mouth 2 (two) times daily with a meal for 7 days.        Follow-up Information     Kayla Jeoffrey RAMAN, FNP Follow up.   Specialty: Family Medicine Why: Hospital follow up Contact information: [address] [place] [address] [phone]         Ren Donley Joelle VEAR, MD Follow up in 2 week(s).   Specialty: Cardiology Contact information: [address] [place] [address] [phone]                Discharge Exam: Gina [name]   09/27/24 2034 09/28/24 0500 09/29/24 0458  Weight: 66.6 kg 70.5 kg 69.2 kg   General exam: Appears calm and comfortable  Respiratory system: Clear to auscultation. Respiratory effort normal. Cardiovascular system: S1 & S2 heard, RRR. No JVD, murmurs, rubs, gallops or clicks. No pedal edema. Gastrointestinal system: Abdomen is nondistended, soft and nontender. No organomegaly or masses felt. Normal bowel sounds heard. Central nervous system: Alert and oriented. No focal neurological deficits. Extremities: Symmetric 5 x 5 power. Skin: No rashes, lesions or ulcers Psychiatry: Judgement and insight appear normal. Mood & affect appropriate.    Condition at discharge: good  The results of significant diagnostics from this hospitalization (including imaging, microbiology, ancillary and laboratory) are listed below for reference.   Imaging  Studies: ECHOCARDIOGRAM COMPLETE Result Date: 09/29/2024    ECHOCARDIOGRAM REPORT   Patient Name:   Gina [name] Date of Exam: 09/29/2024 Medical Rec #:  [id number]       Height:       65.0 in Accession #:    [id number]      Weight:       152.6 lb Date of Birth:  1946/06/22       BSA:          1.763 m Patient Age:    78 years        BP:           179/69 mmHg Patient Gender: F               HR:           64 bpm. Exam Location:  [facility] Procedure: 2D Echo, Color Doppler, Cardiac Doppler and Intracardiac            Opacification Agent (Both Spectral and Color Flow Doppler were  utilized during procedure). Indications:     Syncope R55  History:         Patient has prior history of Echocardiogram examinations, most                  recent 02/11/2022. Signs/Symptoms:Syncope.  Sonographer:     Rosina Dunk Referring Phys:  [id number] CORT ONEIDA MANA Diagnosing Phys: Lonni Hanson MD IMPRESSIONS  1. Left ventricular ejection fraction, by estimation, is 65 to 70%. The left ventricle has normal function. The left ventricle has no regional wall motion abnormalities. There is mild left ventricular hypertrophy. Left ventricular diastolic parameters are consistent with Grade II diastolic dysfunction (pseudonormalization).  2. Right ventricular systolic function is normal. The right ventricular size is mildly enlarged. There is normal pulmonary artery systolic pressure.  3. Left atrial size was moderately dilated.  4. The mitral valve is degenerative. Mild to moderate mitral valve regurgitation. No evidence of mitral stenosis. Moderate mitral annular calcification.  5. The aortic valve is tricuspid. Aortic valve regurgitation is not visualized. No aortic stenosis is present.  6. The inferior vena cava is normal in size with greater than 50% respiratory variability, suggesting right atrial pressure of 3 mmHg. FINDINGS  Left Ventricle: Left ventricular ejection fraction, by estimation, is 65 to 70%. The left  ventricle has normal function. The left ventricle has no regional wall motion abnormalities. Definity  contrast agent was given IV to delineate the left ventricular  endocardial borders. The left ventricular internal cavity size was normal in size. There is mild left ventricular hypertrophy. Left ventricular diastolic parameters are consistent with Grade II diastolic dysfunction (pseudonormalization). Right Ventricle: The right ventricular size is mildly enlarged. No increase in right ventricular wall thickness. Right ventricular systolic function is normal. There is normal pulmonary artery systolic pressure. The tricuspid regurgitant velocity is 2.73  m/s, and with an assumed right atrial pressure of 3 mmHg, the estimated right ventricular systolic pressure is 32.8 mmHg. Left Atrium: Left atrial size was moderately dilated. Right Atrium: Right atrial size was normal in size. Pericardium: There is no evidence of pericardial effusion. Mitral Valve: The mitral valve is degenerative in appearance. There is mild thickening of the mitral valve leaflet(s). Moderate mitral annular calcification. Mild to moderate mitral valve regurgitation. No evidence of mitral valve stenosis. MV peak gradient, 5.9 mmHg. The mean mitral valve gradient is 1.0 mmHg. Tricuspid Valve: The tricuspid valve is grossly normal. Tricuspid valve regurgitation is mild. Aortic Valve: The aortic valve is tricuspid. Aortic valve regurgitation is not visualized. No aortic stenosis is present. Aortic valve mean gradient measures 2.0 mmHg. Aortic valve peak gradient measures 3.8 mmHg. Aortic valve area, by VTI measures 2.24 cm. Pulmonic Valve: The pulmonic valve was not well visualized. Pulmonic valve regurgitation is mild. No evidence of pulmonic stenosis. Aorta: The aortic root and ascending aorta are structurally normal, with no evidence of dilitation. Pulmonary Artery: The pulmonary artery is of normal size. Venous: The inferior vena cava is normal in  size with greater than 50% respiratory variability, suggesting right atrial pressure of 3 mmHg. IAS/Shunts: The interatrial septum was not well visualized.  LEFT VENTRICLE PLAX 2D LVIDd:         3.80 cm     Diastology LVIDs:         1.90 cm     LV e' medial:    7.83 cm/s LV PW:         1.10 cm     LV E/e' medial:  12.2 LV  IVS:        1.00 cm     LV e' lateral:   8.05 cm/s LVOT diam:     1.80 cm     LV E/e' lateral: 11.9 LV SV:         50 LV SV Index:   28 LVOT Area:     2.54 cm LV IVRT:       106 msec  LV Volumes (MOD) LV vol d, MOD A2C: 55.1 ml LV vol d, MOD A4C: 47.2 ml LV vol s, MOD A2C: 11.1 ml LV vol s, MOD A4C: 17.2 ml LV SV MOD A2C:     44.0 ml LV SV MOD A4C:     47.2 ml LV SV MOD BP:      38.6 ml RIGHT VENTRICLE             IVC RV Basal diam:  4.30 cm     IVC diam: 1.30 cm RV Mid diam:    3.40 cm RV S prime:     13.60 cm/s TAPSE (M-mode): 3.2 cm LEFT ATRIUM             Index        RIGHT ATRIUM           Index LA diam:        3.70 cm 2.10 cm/m   RA Area:     16.30 cm LA Vol (A2C):   64.2 ml 36.41 ml/m  RA Volume:   40.90 ml  23.20 ml/m LA Vol (A4C):   59.3 ml 33.63 ml/m LA Biplane Vol: 62.3 ml 35.34 ml/m  AORTIC VALVE                    PULMONIC VALVE AV Area (Vmax):    2.39 cm     PV Vmax:          0.87 m/s AV Area (Vmean):   2.19 cm     PV Vmean:         60.700 cm/s AV Area (VTI):     2.24 cm     PV VTI:           0.192 m AV Vmax:           97.50 cm/s   PV Peak grad:     3.0 mmHg AV Vmean:          68.800 cm/s  PV Mean grad:     2.0 mmHg AV VTI:            0.224 m      PR End Diast Vel: 5.11 msec AV Peak Grad:      3.8 mmHg     RVOT Peak grad:   2 mmHg AV Mean Grad:      2.0 mmHg LVOT Vmax:         91.70 cm/s LVOT Vmean:        59.200 cm/s LVOT VTI:          0.197 m LVOT/AV VTI ratio: 0.88  AORTA Ao Root diam: 3.10 cm Ao Asc diam:  3.00 cm MITRAL VALVE               TRICUSPID VALVE MV Area (PHT): 3.72 cm    TR Peak grad:   29.8 mmHg MV Area VTI:   1.60 cm    TR Mean grad:   24.0 mmHg MV Peak  grad:  5.9 mmHg    TR Vmax:  273.00 cm/s MV Mean grad:  1.0 mmHg    TR Vmean:       240.0 cm/s MV Vmax:       1.21 m/s MV Vmean:      52.7 cm/s   SHUNTS MV Decel Time: 204 msec    Systemic VTI:  0.20 m MV E velocity: 95.90 cm/s  Systemic Diam: 1.80 cm MV A velocity: 62.40 cm/s  Pulmonic VTI:  0.163 m MV E/A ratio:  1.54 Lonni Hanson MD Electronically signed by Lonni Hanson MD Signature Date/Time: 09/29/2024/4:40:09 PM    Final    CT Angio Chest PE W and/or Wo Contrast Result Date: 09/27/2024 CLINICAL DATA:  Syncope and chest pain. Clinical concern for pulmonary embolus. Patient was found down. EXAM: CT ANGIOGRAPHY CHEST WITH CONTRAST TECHNIQUE: Multidetector CT imaging of the chest was performed using the standard protocol during bolus administration of intravenous contrast. Multiplanar CT image reconstructions and MIPs were obtained to evaluate the vascular anatomy. RADIATION DOSE REDUCTION: This exam was performed according to the departmental dose-optimization program which includes automated exposure control, adjustment of the mA and/or kV according to patient size and/or use of iterative reconstruction technique. CONTRAST:  75mL OMNIPAQUE  IOHEXOL  350 MG/ML SOLN COMPARISON:  01/13/2015 FINDINGS: Cardiovascular: The heart size is upper normal to borderline enlarged. No substantial pericardial effusion. Coronary artery calcification is evident. Mild atherosclerotic calcification is noted in the wall of the thoracic aorta. There is no filling defect within the opacified pulmonary arteries to suggest the presence of an acute pulmonary embolus. Mediastinum/Nodes: Upper normal prevascular lymph nodes are stable. 14 mm short axis AP window lymph node was not definitely seen previously. Similar 9 mm short axis subcarinal node. No hilar lymphadenopathy. The esophagus has normal imaging features. There is no axillary lymphadenopathy. Lungs/Pleura: Biapical pleuroparenchymal scarring evident. Tiny  subpleural nodules in both upper lobes identified measuring up to 5 mm on image 21/6. These are probably atelectasis or scarring related. No overtly suspicious pulmonary nodule or mass. Subpleural reticulation anterior left upper lobe is compatible with radiation fibrosis in this patient with a history of breast cancer. No focal airspace consolidation. No pneumothorax. No pleural effusion. Upper Abdomen: Visualized portion of the upper abdomen shows no acute findings. Musculoskeletal: No worrisome lytic or sclerotic osseous abnormality. No evidence for rib fracture. Focal cortical buckling identified in the anterior cortex of the mid sternum, finding not present on the previous exam. Although no discrete fracture line is evident, given the history of CPR, nondisplaced or incomplete sternal fracture would be a consideration. Review of the MIP images confirms the above findings. IMPRESSION: 1. No CT evidence for acute pulmonary embolus. 2. Focal cortical buckling in the anterior cortex of the mid sternum, finding not present on the previous exam. Although no discrete fracture line is evident, given the history of CPR, nondisplaced or incomplete sternal fracture would be a consideration. 3. Tiny subpleural nodules in both upper lobes measuring up to 5 mm. These are probably atelectasis or scarring related. 4. 14 mm short axis AP window lymph node was not definitely seen previously. This is likely reactive. Follow-up CT chest in 3 months recommended to ensure stability. 5.  Aortic Atherosclerosis (ICD10-I70.0). Electronically Signed   By: Camellia Candle M.D.   On: 09/27/2024 07:02   CT HEAD WO CONTRAST ( ) Result Date: 09/27/2024 EXAM: CT HEAD WITHOUT 09/27/2024 06:52:52 AM TECHNIQUE: CT of the head was performed without the administration of intravenous contrast. Automated exposure control, iterative reconstruction, and/or weight based adjustment of  the mA/kV was utilized to reduce the radiation dose to as low as  reasonably achievable. COMPARISON: Brain MRI and Head CT dated 02/11/2022. CLINICAL HISTORY: 78 year old female with syncope on apixaban , evaluation for intracranial hemorrhage. Found unresponsive status post cardiopulmonary resuscitation. FINDINGS: BRAIN AND VENTRICLES: No acute intracranial hemorrhage. No mass effect or midline shift. No extra-axial fluid collection. No evidence of acute infarct. No hydrocephalus. Brain volume is stable, within normal limits for age. Cerebral sulci and gray white differentiation are preserved, stable. Chronic lacunar infarct in the left thalamus. No suspicious intracranial vascular hyperdensity. Mild for age calcified atherosclerosis at the skull base. ORBITS: No acute abnormality. SINUSES AND MASTOIDS: Paranasal sinuses, tympanic cavities and mastoids remain clear. SOFT TISSUES AND SKULL: No acute skull fracture. No acute soft tissue abnormality. IMPRESSION: 1. No acute intracranial abnormality. 2. Stable non-contrast CT appearance of chronic small vessel disease. Electronically signed by: Helayne Hurst MD 09/27/2024 06:57 AM EST RP Workstation: [id number]   DG Chest Portable 1 View Result Date: 09/27/2024 CLINICAL DATA:  Unresponsiveness.  Status post CPR. EXAM: PORTABLE CHEST 1 VIEW COMPARISON:  09/13/2024 FINDINGS: The cardio pericardial silhouette is enlarged. Mild vascular congestion without edema. No focal consolidation or pleural effusion. No pneumothorax. Telemetry leads overlie the chest. IMPRESSION: Enlargement of the cardiopericardial silhouette with mild vascular congestion. Electronically Signed   By: Camellia Candle M.D.   On: 09/27/2024 05:45   DG Chest Portable 1 View Result Date: 09/13/2024 EXAM: 1 VIEW(S) XRAY OF THE CHEST 09/13/2024 08:15:25 AM COMPARISON: AP radiograph of the chest dated 03/06/2015. CLINICAL HISTORY: chest pain FINDINGS: LUNGS AND PLEURA: No focal pulmonary opacity. No pleural effusion. No pneumothorax. HEART AND MEDIASTINUM: Aortic  atherosclerosis. BONES AND SOFT TISSUES: No acute osseous abnormality. IMPRESSION: 1. No acute findings. 2. Aortic atherosclerosis. Electronically signed by: Evalene Coho MD 09/13/2024 08:30 AM EST RP Workstation: [id number]    Microbiology: Results for orders placed or performed during the hospital encounter of 09/27/24  Respiratory (~20 pathogens) panel by PCR     Status: Abnormal   Collection Time: 09/27/24 12:25 PM   Specimen: Nasopharyngeal Swab; Respiratory  Result Value Ref Range Status   Adenovirus NOT DETECTED NOT DETECTED Final   Coronavirus 229E NOT DETECTED NOT DETECTED Final    Comment: (NOTE) The Coronavirus on the Respiratory Panel, DOES NOT test for the novel  Coronavirus (2019 nCoV)    Coronavirus HKU1 NOT DETECTED NOT DETECTED Final   Coronavirus NL63 NOT DETECTED NOT DETECTED Final   Coronavirus OC43 NOT DETECTED NOT DETECTED Final   Metapneumovirus NOT DETECTED NOT DETECTED Final   Rhinovirus / Enterovirus NOT DETECTED NOT DETECTED Final   Influenza A H3 DETECTED (A) NOT DETECTED Final   Influenza B NOT DETECTED NOT DETECTED Final   Parainfluenza Virus 1 NOT DETECTED NOT DETECTED Final   Parainfluenza Virus 2 NOT DETECTED NOT DETECTED Final   Parainfluenza Virus 3 NOT DETECTED NOT DETECTED Final   Parainfluenza Virus 4 NOT DETECTED NOT DETECTED Final   Respiratory Syncytial Virus NOT DETECTED NOT DETECTED Final   Bordetella pertussis NOT DETECTED NOT DETECTED Final   Bordetella Parapertussis NOT DETECTED NOT DETECTED Final   Chlamydophila pneumoniae NOT DETECTED NOT DETECTED Final   Mycoplasma pneumoniae NOT DETECTED NOT DETECTED Final    Comment: Performed at [facility], [address], [place], [place] [address]  Culture, blood (Routine X 2) w Reflex to ID Panel     Status: None (Preliminary result)   Collection Time: 09/27/24  4:34 PM  Specimen: BLOOD  Result Value Ref Range Status   Specimen Description BLOOD BLOOD RIGHT HAND  Final   Special  Requests   Final    BOTTLES DRAWN AEROBIC AND ANAEROBIC Blood Culture adequate volume   Culture   Final    NO GROWTH 2 DAYS Performed at [facility], [address], [place], [place] [address]    Report Status PENDING  Incomplete  Resp panel by RT-PCR (RSV, Flu A&B, Covid) Anterior Nasal Swab     Status: Abnormal   Collection Time: 09/27/24  6:19 PM   Specimen: Anterior Nasal Swab  Result Value Ref Range Status   SARS Coronavirus 2 by RT PCR NEGATIVE NEGATIVE Final    Comment: (NOTE) SARS-CoV-2 target nucleic acids are NOT DETECTED.  The SARS-CoV-2 RNA is generally detectable in upper respiratory specimens during the acute phase of infection. The lowest concentration of SARS-CoV-2 viral copies this assay can detect is 138 copies/mL. A negative result does not preclude SARS-Cov-2 infection and should not be used as the sole basis for treatment or other patient management decisions. A negative result may occur with  improper specimen collection/handling, submission of specimen other than nasopharyngeal swab, presence of viral mutation(s) within the areas targeted by this assay, and inadequate number of viral copies(<138 copies/mL). A negative result must be combined with clinical observations, patient history, and epidemiological information. The expected result is Negative.  Fact Sheet for Patients:  bloggercourse.com  Fact Sheet for Healthcare Providers:  seriousbroker.it  This test is no t yet approved or cleared by the [place]  FDA and  has been authorized for detection and/or diagnosis of SARS-CoV-2 by FDA under an Emergency Use Authorization (EUA). This EUA will remain  in effect (meaning this test can be used) for the duration of the COVID-19 declaration under Section 564(b)(1) of the Act, 21 U.S.C.section 360bbb-3(b)(1), unless the authorization is terminated  or revoked sooner.       Influenza A by PCR  POSITIVE (A) NEGATIVE Final   Influenza B by PCR NEGATIVE NEGATIVE Final    Comment: (NOTE) The Xpert Xpress SARS-CoV-2/FLU/RSV plus assay is intended as an aid in the diagnosis of influenza from Nasopharyngeal swab specimens and should not be used as a sole basis for treatment. Nasal washings and aspirates are unacceptable for Xpert Xpress SARS-CoV-2/FLU/RSV testing.  Fact Sheet for Patients: bloggercourse.com  Fact Sheet for Healthcare Providers: seriousbroker.it  This test is not yet approved or cleared by the [place]  FDA and has been authorized for detection and/or diagnosis of SARS-CoV-2 by FDA under an Emergency Use Authorization (EUA). This EUA will remain in effect (meaning this test can be used) for the duration of the COVID-19 declaration under Section 564(b)(1) of the Act, 21 U.S.C. section 360bbb-3(b)(1), unless the authorization is terminated or revoked.     Resp Syncytial Virus by PCR NEGATIVE NEGATIVE Final    Comment: (NOTE) Fact Sheet for Patients: bloggercourse.com  Fact Sheet for Healthcare Providers: seriousbroker.it  This test is not yet approved or cleared by the [place]  FDA and has been authorized for detection and/or diagnosis of SARS-CoV-2 by FDA under an Emergency Use Authorization (EUA). This EUA will remain in effect (meaning this test can be used) for the duration of the COVID-19 declaration under Section 564(b)(1) of the Act, 21 U.S.C. section 360bbb-3(b)(1), unless the authorization is terminated or revoked.  Performed at [facility], [address] [place], [place] [address]   Culture, blood (Routine X 2) w Reflex to  ID Panel     Status: None (Preliminary result)   Collection Time: 09/27/24  8:26 PM   Specimen: BLOOD  Result Value Ref Range Status   Specimen Description BLOOD BLOOD RIGHT HAND  Final   Special Requests    Final    BOTTLES DRAWN AEROBIC AND ANAEROBIC Blood Culture results may not be optimal due to an inadequate volume of blood received in culture bottles   Culture   Final    NO GROWTH 2 DAYS Performed at [facility], [address] [place], [place] [address]    Report Status PENDING  Incomplete    Labs: CBC: Recent Labs  Lab 09/27/24 0509  WBC 7.2  NEUTROABS 6.1  HGB 12.5  HCT 38.8  MCV 87.8  PLT 203   Basic Metabolic Panel: Recent Labs  Lab 09/27/24 0509 09/29/24 0544  NA 134* 129*  K 4.4 3.4*  CL 96* 95*  CO2 25 24  GLUCOSE 215* 125*  BUN 13 10  CREATININE 0.80 0.57  CALCIUM  8.9 8.1*  MG 2.1 1.9   Liver Function Tests: Recent Labs  Lab 09/27/24 0509  AST 26  ALT 20  ALKPHOS 76  BILITOT 0.8  PROT 6.6  ALBUMIN 4.0   CBG: Recent Labs  Lab 09/29/24 0303 09/29/24 0430 09/29/24 0828 09/29/24 1223 09/29/24 1530  GLUCAP 147* 125* 134* 195* 124*    Discharge time spent: 35 minutes.  Signed: Murvin Mana, MD [facility] 09/29/2024

## 2024-09-30 ENCOUNTER — Telehealth: Payer: Self-pay

## 2024-09-30 NOTE — Transitions of Care (Post Inpatient/ED Visit) (Signed)
° °  09/30/2024  Name: Gina [name] MRN: [id number] DOB: Sep 14, 1946  Today's TOC FU Call Status: Today's TOC FU Call Status:: Successful TOC FU Call Completed TOC FU Call Complete Date: 09/30/24  Patient's Name and Date of Birth confirmed. Name, DOB  Transition Care Management Follow-up Telephone Call Date of Discharge: 09/29/24 Discharge Facility: [facility]) Type of Discharge: Inpatient Admission Primary Inpatient Discharge Diagnosis:: syncope How have you been since you were released from the hospital?: Better Any questions or concerns?: No  Items Reviewed: Did you receive and understand the discharge instructions provided?: Yes Medications obtained,verified, and reconciled?: Yes (Medications Reviewed) Any new allergies since your discharge?: No Dietary orders reviewed?: Yes Do you have support at home?: Yes People in Home [RPT]: child(ren), adult, spouse  Medications Reviewed Today: Medications Reviewed Today     Reviewed by Emmitt Pan, LPN (Licensed Practical Nurse) on 09/30/24 at 1541  Med List Status: <None>   Medication Order Taking? Sig Documenting Provider Last Dose Status Informant  apixaban  (ELIQUIS ) 5 MG TABS tablet [id number] Yes Take 1 tablet (5 mg total) by mouth 2 (two) times daily. Terra Fairy PARAS, PA-C  Active Spouse/Significant Other  benzonatate  (TESSALON ) 100 MG capsule [id number] Yes Take 1 capsule (100 mg total) by mouth 3 (three) times daily as needed for cough. Laurita Pillion, MD  Active   Continuous Glucose Sensor (DEXCOM G7 SENSOR) [place] [id number] Yes by Does not apply route. [provider]  Active Spouse/Significant Other  ezetimibe  (ZETIA ) 10 MG tablet [id number] Yes Take 10 mg by mouth daily. [provider]  Active Spouse/Significant Other  glucose blood (ONE TOUCH ULTRA TEST) test strip [id number] Yes Use asto check blood sugar 5 times a day Von [place], MD  Active Spouse/Significant Other  insulin   aspart (NOVOLOG ) 100 UNIT/ML injection [id number] Yes USE MAX OF 60 UNITS DAILY VIA INSULIN  PUMP. Thapa, [place], MD  Active Spouse/Significant Other  losartan  (COZAAR ) 50 MG tablet [id number] Yes Take 1 tablet (50 mg total) by mouth daily. Kayla Jeoffrey RAMAN, FNP  Active Spouse/Significant Other  oseltamivir  (TAMIFLU ) 30 MG capsule [id number] Yes Take 1 capsule (30 mg total) by mouth 2 (two) times daily for 3 days. Laurita Pillion, MD  Active   sodium chloride  1 g tablet [id number] Yes Take 1 tablet (1 g total) by mouth 2 (two) times daily with a meal for 7 days. Laurita Pillion, MD  Active   topical emolient VIOLET) emulsion [id number]   Izell Domino, MD  Active             Home Care and Equipment/Supplies: Were Home Health Services Ordered?: NA Any new equipment or medical supplies ordered?: NA  Functional Questionnaire: Do you need assistance with bathing/showering or dressing?: No Do you need assistance with meal preparation?: No Do you need assistance with eating?: No Do you have difficulty maintaining continence: No Do you need assistance with getting out of bed/getting out of a chair/moving?: No Do you have difficulty managing or taking your medications?: No  Follow up appointments reviewed: PCP Follow-up appointment confirmed?: Yes Date of PCP follow-up appointment?: 10/12/24 Follow-up Provider: [facility] Follow-up appointment confirmed?: NA Do you need transportation to your follow-up appointment?: No Do you understand care options if your condition(s) worsen?: Yes-patient verbalized understanding    SIGNATURE Pan Emmitt, LPN [facility] Nurse Health Advisor Direct Dial [phone]

## 2024-10-02 LAB — CULTURE, BLOOD (ROUTINE X 2)
Culture: NO GROWTH
Culture: NO GROWTH
Special Requests: ADEQUATE

## 2024-10-12 ENCOUNTER — Encounter: Payer: Self-pay | Admitting: Family Medicine

## 2024-10-12 ENCOUNTER — Ambulatory Visit: Admitting: Family Medicine

## 2024-10-12 VITALS — BP 148/66 | HR 74 | Ht 65.0 in | Wt 142.8 lb

## 2024-10-12 DIAGNOSIS — E039 Hypothyroidism, unspecified: Secondary | ICD-10-CM

## 2024-10-12 DIAGNOSIS — R55 Syncope and collapse: Secondary | ICD-10-CM | POA: Diagnosis not present

## 2024-10-12 MED ORDER — AMLODIPINE BESYLATE 10 MG PO TABS: 10.0000 mg | ORAL_TABLET | Freq: Every day | ORAL | 1 refills | Status: AC

## 2024-10-12 NOTE — Progress Notes (Signed)
 "  Acute Office Visit  Patient ID: Gina [name], female    DOB: November 23, 1945, 78 y.o.   MRN: [id number]  PCP: Kayla Jeoffrey RAMAN, FNP  Chief Complaint  Patient presents with   Hospitalization Follow-up    09/27/24 syncope and collapse      Subjective:     HPI  Ms Gina [name] is here today for hospital follow up. She was hospitalized from 09/27/2024-09/29/2024 for a syncopal episode. CPR was performed by her daughter at home however undetermined if there was a true cardiac arrest. She was flu positive on arrival to the hospital. Normal EKG, CT angiogram, echocardiogram. Discharged with [facility] recorder. See HPI below.  Discharge Summary 09/29/2024 for reference only: Hospital Course: [name] is a 78 y.o. 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  Per patient daughter, patient became unresponsive after syncope, she was directed by paramedics to start chest compressions, she was compressed x10, patient woke up right away. Upon arriving to hospital, patient had a high fever, positive for influenza. Patient is treated with Tamiflu . Telemetry did not show any arrhythmia, echocardiogram showed ejection fraction 65 to 70%, grade 2 diastolic dysfunction.  Mild to moderate mitral regurgitation.  Patient is symptomatically improved, medically stable for discharge.   Had ZIO recorder set up, will follow-up with cardiology for results.   Assessment and Plan: Syncope and collapse. Patient has no history of syncope in the past, no recent GI symptoms.  But she is positive for influenza, which may have contributed to syncope episode. Patient was started on CPR at home by daughter, not sure patient had a true cardiac arrest.   Reviewed EKG, patient has sinus rhythm, QTc 447.   CT angiogram did not show a PE. Orthostatic vital signs did not show significant orthostatic  hypotension. Echocardiogram performed as above, no significant valvular disease. Telemetry did not show any arrhythmia. Patient has set up ZIO recorder before discharge, will follow with cardiology to review his results.   Influenza A infection. Patient spiked a fever, chest CT scan did not show pneumonia. Symptom improving, will continue Tamiflu  for additional 3 days.   Type 1 diabetes with hyperglycemia. Resume insulin  pump.   Paroxysmal atrial fibrillation. Currently in sinus rhythm, continue anticoagulation.   Chronic hyponatremia. Hypokalemia. Potassium is 3.4, give a dose of oral potassium before discharge.  Sodium level 129, reviewed chart, patient has a chronic hyponatremia for years.  I have started sodium tablets 1 g twice a day x 7 days, please follow-up on the BMP at the next office visit.   Essential hypertension. Continue home medicines.  Discussed the use of AI scribe software for clinical note transcription with the patient, who gave verbal consent to proceed.  History of Present Illness Gina [name] is an 78 year old female who presents with recurrent episodes of syncope. She is accompanied by Alisa, her daughter, and her husband.  On December 14th, 2025, she experienced a syncopal episode around 3:15 AM. She was found sitting on the side of her bed, feeling unwell, and then slumped to the floor. CPR was initiated by her daughter, Alisa, after she was unresponsive for approximately 20 minutes. Her eyes opened briefly during compressions and again when oxygen was administered by paramedics. She was transported to the hospital where a flu test returned positive, and she was started on Tamiflu  and salt tablets. She underwent a hospital workup that included a CT scan,  chest x-ray, and heart ultrasound.  On December 27th, 2025, she experienced another episode of syncope while reloading her insulin  pump. She was found on the floor but regained consciousness without EMS  intervention. Her blood sugar was approximately 70 mg/dL at the time, which she noted was not low enough to cause syncope based on her past experiences. She felt 'weighty' and experienced dizziness just before the episode. She is currently wearing a heart monitor, which is due to be returned today.  She has a history of diabetes managed with an insulin  pump. She has experienced low blood sugar levels in the past but did not report any hypoglycemic symptoms during the recent episodes. She has completed her course of Tamiflu  and salt tablets. She reports weakness in her legs and requires assistance to walk confidently. She has been sleeping in a recliner due to chest discomfort when lying flat related to chest compressions.  She maintains hydration with Liquid IV and continues to consume coffee. No recent chest pain, shortness of breath, or significant dizziness outside of the syncopal episodes. No seizure-like activity or tremors during these episodes.   Review of Systems  Respiratory: Negative.    Cardiovascular: Negative.   Neurological: Negative.   All other systems reviewed and are negative.   Past Medical History:  Diagnosis Date   Anxiety    new dx   Atrial fibrillation (HCC)    a. 03/2015 s/p RFCA;  b. CHA2DS2VASc = 5-->Eliquis .   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 12/2014 - due to AF RVR;  b. 03/2015 Echo: EF 60-65%, no rwma.   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: 1/1 Node Positive for Mets.: ER/PR POs., Her 2 Neu negative, Ki-67 12%   COLONOSCOPY     1 polyp    ELECTROPHYSIOLOGIC STUDY N/A 04/08/2015   PVI + CTI ablation + posterior Box lesion + Atach ablation by Dr Kelsie   OVARIAN CYST SURGERY     TEE WITHOUT CARDIOVERSION N/A 04/07/2015   Procedure: TRANSESOPHAGEAL ECHOCARDIOGRAM (TEE);  Surgeon: Aleene JINNY Passe, MD;  Location: [facility] ENDOSCOPY;  Service: Cardiovascular;  Laterality: N/A;   TONSILLECTOMY      Outpatient Medications Prior to Visit  Medication Sig Dispense Refill   thyroid  (ARMOUR) 15 MG tablet Take 15 mg by mouth daily.     thyroid  (ARMOUR) 30 MG tablet Take 30 mg by mouth daily before breakfast.     apixaban  (ELIQUIS ) 5 MG TABS tablet Take 1 tablet (5 mg total) by mouth 2 (two) times daily. 180 tablet 3   benzonatate  (TESSALON ) 100 MG capsule Take 1 capsule (100 mg total) by mouth 3 (three) times daily as needed for cough. 20 capsule 0   Continuous Glucose Sensor (DEXCOM G7 SENSOR) MISC by  Does not apply route.     ezetimibe  (ZETIA ) 10 MG tablet Take 10 mg by mouth daily.     glucose blood (ONE TOUCH ULTRA TEST) test strip Use asto check blood sugar 5 times a day 200 each 5   insulin  aspart (NOVOLOG ) 100 UNIT/ML injection USE MAX OF 60 UNITS DAILY VIA INSULIN  PUMP. 50 mL 3   losartan  (COZAAR ) 50 MG tablet Take 1 tablet (50 mg total) by mouth daily. 30 tablet 1   Facility-Administered Medications Prior to Visit  Medication Dose Route Frequency Provider Last Rate Last Admin   topical emolient (BIAFINE) emulsion   Topical Daily Izell Domino, MD   Given at 05/28/12 1400    Allergies[1]     Objective:    BP (!) 148/66 Comment: personal home cuff rt arm  Pulse 74   Ht 5' 5 (1.651 m)   Wt 142 lb 12.8 oz (64.8 kg)   SpO2 97%   BMI 23.76 kg/m  BP Readings from Last 3 Encounters:   10/12/24 (!) 148/66  09/29/24 (!) 149/65  09/24/24 (!) 146/80   Wt Readings from Last 3 Encounters:  10/12/24 142 lb 12.8 oz (64.8 kg)  09/29/24 152 lb 8.9 oz (69.2 kg)  09/24/24 150 lb 9.6 oz (68.3 kg)      Physical Exam Vitals and nursing note reviewed.  Constitutional:      Appearance: Normal appearance. She is normal weight.  HENT:     Head: Normocephalic and atraumatic.  Neck:     Vascular: No carotid bruit.  Cardiovascular:     Rate and Rhythm: Normal rate and regular rhythm.     Pulses: Normal pulses.     Heart sounds: Normal heart sounds.  Pulmonary:     Effort: Pulmonary effort is normal.     Breath sounds: Normal breath sounds.  Skin:    General: Skin is warm and dry.  Neurological:     General: No focal deficit present.     Mental Status: She is alert and oriented to person, place, and time. Mental status is at baseline.  Psychiatric:        Mood and Affect: Mood normal.        Behavior: Behavior normal.        Thought Content: Thought content normal.        Judgment: Judgment normal.       No results found for any visits on 10/12/24.     Assessment & Plan:   Problem List Items Addressed This Visit       Endocrine   Hypothyroidism (Chronic)   Relevant Medications   thyroid  (ARMOUR) 15 MG tablet   thyroid  (ARMOUR) 30 MG tablet   Other Relevant Orders   TSH     Other   Syncope - Primary   Relevant Orders   Basic Metabolic Panel Without GFR   EKG 12-Lead   TSH    Assessment and Plan Assessment & Plan Recurrent syncope Recent syncope episodes with no clear etiology. Differential includes cardiac and neurological causes. Recent flu may have contributed to electrolyte imbalance. - Ordered EKG- NSR with old anteroseptal infarct, unchanged from prior - Contacted cardiology to expedite appointment. - Ordered blood work to recheck sodium and potassium levels. - Encouraged hydration with electrolyte fluids. - Strict ED  precautions  Hypertension - Correlated with home cuff and home readings have been elevated. - Amlodipine  has been held since hospitalization for unknown reason - Restart Amlodipine  10mg  daily and continue Losartan  50mg   daily.  - Monitor blood pressure at home daily. - Keep appointment with Cardiology Monday - Follow up in 1 month or sooner if needed  Hypothyroidism - Armour thyroid  has been held since hospitalization - TSH normal during hospitalization and prior, no recent dose adjustments - Resume Thyroid  Armour 45mg  daily.    Meds ordered this encounter  Medications   amLODipine  (NORVASC ) 10 MG tablet    Sig: Take 1 tablet (10 mg total) by mouth daily.    Dispense:  30 tablet    Refill:  1    Supervising Provider:   DUANNE LOWERS T [[id number]]    Return in about 5 weeks (around 11/16/2024), or if symptoms worsen or fail to improve, for chronic follow-up with labs 1 week prior.  Jeoffrey GORMAN Barrio, FNP  [facility]        [1]  Allergies Allergen Reactions   Rosuvastatin  Calcium      Headaches, fatigue   Rosuvastatin  Calcium  Other (See Comments)    Headaches, fatigue   "

## 2024-10-13 ENCOUNTER — Encounter: Payer: Self-pay | Admitting: Endocrinology

## 2024-10-13 ENCOUNTER — Ambulatory Visit (INDEPENDENT_AMBULATORY_CARE_PROVIDER_SITE_OTHER): Admitting: Endocrinology

## 2024-10-13 ENCOUNTER — Ambulatory Visit: Payer: Self-pay | Admitting: Endocrinology

## 2024-10-13 ENCOUNTER — Encounter (HOSPITAL_COMMUNITY)

## 2024-10-13 ENCOUNTER — Ambulatory Visit: Payer: Self-pay | Admitting: Family Medicine

## 2024-10-13 VITALS — BP 132/62 | Ht 65.0 in | Wt 144.0 lb

## 2024-10-13 DIAGNOSIS — E871 Hypo-osmolality and hyponatremia: Secondary | ICD-10-CM | POA: Diagnosis not present

## 2024-10-13 DIAGNOSIS — E038 Other specified hypothyroidism: Secondary | ICD-10-CM | POA: Diagnosis not present

## 2024-10-13 DIAGNOSIS — E1065 Type 1 diabetes mellitus with hyperglycemia: Secondary | ICD-10-CM

## 2024-10-13 LAB — BASIC METABOLIC PANEL WITHOUT GFR
BUN: 13 mg/dL (ref 7–25)
CO2: 27 mmol/L (ref 20–32)
Calcium: 9.3 mg/dL (ref 8.6–10.4)
Chloride: 97 mmol/L — ABNORMAL LOW (ref 98–110)
Creat: 0.67 mg/dL (ref 0.60–0.95)
Glucose, Bld: 173 mg/dL — ABNORMAL HIGH (ref 65–99)
Potassium: 4.8 mmol/L (ref 3.5–5.3)
Sodium: 134 mmol/L — ABNORMAL LOW (ref 135–146)

## 2024-10-13 LAB — POCT GLYCOSYLATED HEMOGLOBIN (HGB A1C): Hemoglobin A1C: 7.4 % — AB (ref 4.0–5.6)

## 2024-10-13 LAB — TSH: TSH: 2.5 m[IU]/L (ref 0.40–4.50)

## 2024-10-13 NOTE — Progress Notes (Signed)
 "  Outpatient Endocrinology Note Algie Westry, MD  10/13/2024  Patient's Name: Gina [name]    DOB: [birth year]    MRN: [id number]                                                    REASON OF VISIT: Follow up for type 1 diabetes mellitus  PCP: Kayla Jeoffrey RAMAN, FNP  HISTORY OF PRESENT ILLNESS:   Gina [name] is a 78 y.o. old female with past medical history listed below, is here for follow up for type 1 diabetes mellitus.   Pertinent Diabetes History: Patient was diagnosed with type 1 diabetes mellitus in 1984.    Chronic Diabetes Complications : Retinopathy: no. Last ophthalmology exam was done on annually, following with ophthalmology regularly.  Nephropathy: no, on losartan . Peripheral neuropathy: no Coronary artery disease: no Stroke: no  Relevant comorbidities and cardiovascular risk factors: Obesity: no Body mass index is 23.96 kg/m.  Hypertension: Yes   Hyperlipidemia : Yes, not on statin. Patient declined.  HYPERCHOLESTEROLEMIA: Her LDL has been persistently high and refuses to take a statin drug including pravastatin  even though it had no side effects previously. Previously her LDL particle number was high at 1621. She was given a prescription for Crestor  which she tried for 3-4 weeks but did not continue because she felt it was giving her headaches and making her tired. She prefers to use natural remedies but these have not been very effective.   She is taking Zetia  regularly as prescribed.  She had been recommended Leqvio which she agreed to try but because of high out-of-pocket expense and no patient assistance available she is not taking this   Triglyceride is consistently normal Usually has a good diet   Current / Home Diabetic regimen includes:  Tandem t:slim insulin  pump on control IQ using NovoLog  U100.  Insulin  Pump setting:  Basal (18.750 units) MN- 0.300u/hour 2AM- 0.450  4AM- 1.10 6AM- 0.90 8AM-    0.900 11:30AM1.00 2PM-     1.050 6PM-      0.700 10PM-   0.300  Bolus CHO Ratio (1unit:CHO) MN- 1:8 2PM- 1:7 6PM- 1:8  Correction/Sensitivity: MN- 1:45 2PM- 1:40 10PM- 1:45  Target: 110   Active insulin  time: 5 hours  Prior diabetic medications:   CONTINUOUS GLUCOSE MONITORING SYSTEM (CGMS) / INSULIN  PUMP INTERPRETATION:                         Tandem Pump & Sensor Download (Reviewed and summarized below.) Pump: Dexcom G7 and Tandem t:slim Dates: December 17 to December 30 , 2025, 14 days Glucose Management Indicator: 7.3% Time in range : 69%  Average total daily insulin : 36 units, Basal: 49%, Bolus: 51%,  (Manual Bolus: 73%,  Control IQ Bolus: 27%)    Previous:        Trends:  She has frequent mild hyperglycemia postprandially with blood sugar mostly in the low 200 range.  Acceptable blood sugar in between the meals and overnight.  She has used activity mode frequently in last 2 weeks.  Occasionally she had significant hyperglycemia with blood sugar up to 300 range requiring multiple manual boluses.  Rare trending down blood sugar with in the low normal range related to multiple manual bolus/auto correctional bolus and meal bolus with insulin  stacking.  No concerning hypoglycemia.  Hypoglycemia: Patient has no hypoglycemic episodes. Patient has hypoglycemia awareness.    Factors modifying glucose control: 1.  Diabetic diet assessment: 3 meals a day.  2.  Staying active or exercising:  Working on her farm, housework and other activities.  3.  Medication compliance: compliant all of the time.  # Primary hypothyroidism : -Patient has been taking Armour Thyroid  which (she prefers) 45 mg daily.  # 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 been usually mild, longstanding intermittent, not on any diuretics or SSRIs. Urine osmolality as of 04/2015 was 618. She was told to cut back on her fluid intake but she is not doing so and she says she is trying to drink more water since she thought this was healthy. Sodium is variable but  generally between 129-135. Has not had any symptoms from hyponatremia   Interval history  Insulin  pump and CGM data as reviewed above.  Hemoglobin A1c slightly worsened to 7.4% today.  She has been taking Armour Thyroid .  Denies palpitation and heat intolerance.  Labs reviewed completed yesterday with serum sodium of 134 and acceptable renal function.  TSH was normal 2.50.  She recently had influenza A, had presyncopal episodes and was hospitalized in the mid of December, recovering, still with significant low energy.  She is accompanied by daughter in the clinic today.  REVIEW OF SYSTEMS As per history of present illness.   PAST MEDICAL HISTORY: Past Medical History:  Diagnosis Date   Anxiety    new dx   Atrial fibrillation (HCC)    a. 03/2015 s/p RFCA;  b. CHA2DS2VASc = 5-->Eliquis .   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 12/2014 - due to AF RVR;  b. 03/2015 Echo: EF 60-65%, no rwma.   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 Node Positive for Mets.: ER/PR POs., Her 2 Neu negative, Ki-67 12%   COLONOSCOPY     1 polyp    ELECTROPHYSIOLOGIC STUDY N/A 04/08/2015   PVI + CTI ablation + posterior Box lesion + Atach ablation by Dr Kelsie   OVARIAN CYST SURGERY     TEE WITHOUT CARDIOVERSION N/A 04/07/2015    Procedure: TRANSESOPHAGEAL ECHOCARDIOGRAM (TEE);  Surgeon: Aleene JINNY Passe, MD;  Location: [facility] ENDOSCOPY;  Service: Cardiovascular;  Laterality: N/A;   TONSILLECTOMY      ALLERGIES: Allergies  Allergen Reactions   Rosuvastatin  Calcium      Headaches, fatigue   Rosuvastatin  Calcium  Other (See Comments)    Headaches, fatigue    FAMILY HISTORY:  Family History  Problem Relation Age of Onset   Breast cancer Mother 20   Heart failure Mother    Prostate cancer Maternal Uncle        died in his 97s   Heart attack Father  Diabetes Maternal Uncle 15    SOCIAL HISTORY: Social History   Socioeconomic History   Marital status: Married    Spouse name: Not on file   Number of children: Not on file   Years of education: Not on file   Highest education level: Not on file  Occupational History   Not on file  Tobacco Use   Smoking status: Former    Types: Cigarettes   Smokeless tobacco: Never   Tobacco comments:    Former smoker 09/24/24  Vaping Use   Vaping status: Never Used  Substance and Sexual Activity   Alcohol use: No   Drug use: No   Sexual activity: Yes  Other Topics Concern   Not on file  Social History Narrative   Not on file   Social Drivers of Health   Tobacco Use: Medium Risk (10/13/2024)   Patient History    Smoking Tobacco Use: Former    Smokeless Tobacco Use: Never    Passive Exposure: Not on Actuary Strain: Low Risk (01/09/2024)   Overall Financial Resource Strain (CARDIA)    Difficulty of Paying Living Expenses: Not hard at all  Food Insecurity: No Food Insecurity (09/27/2024)   Epic    Worried About Radiation Protection Practitioner of Food in the Last Year: Never true    Ran Out of Food in the Last Year: Never true  Transportation Needs: No Transportation Needs (09/27/2024)   Epic    Lack of Transportation (Medical): No    Lack of Transportation (Non-Medical): No  Physical Activity: Insufficiently Active (01/09/2024)   Exercise Vital Sign    Days of  Exercise per Week: 3 days    Minutes of Exercise per Session: 30 min  Stress: No Stress Concern Present (01/09/2024)   [employer] of Occupational Health - Occupational Stress Questionnaire    Feeling of Stress : Not at all  Social Connections: Socially Integrated (09/27/2024)   Social Connection and Isolation Panel    Frequency of Communication with Friends and Family: More than three times a week    Frequency of Social Gatherings with Friends and Family: More than three times a week    Attends Religious Services: More than 4 times per year    Active Member of Clubs or Organizations: Yes    Attends Banker Meetings: More than 4 times per year    Marital Status: Married  Depression (PHQ2-9): Low Risk (09/17/2024)   Depression (PHQ2-9)    PHQ-2 Score: 1  Alcohol Screen: Low Risk (01/09/2024)   Alcohol Screen    Last Alcohol Screening Score (AUDIT): 0  Housing: Low Risk (09/27/2024)   Epic    Unable to Pay for Housing in the Last Year: No    Number of Times Moved in the Last Year: 0    Homeless in the Last Year: No  Utilities: Not At Risk (09/27/2024)   Epic    Threatened with loss of utilities: No  Health Literacy: Adequate Health Literacy (01/09/2024)   B1300 Health Literacy    Frequency of need for help with medical instructions: Never    MEDICATIONS:  Current Outpatient Medications  Medication Sig Dispense Refill   amLODipine  (NORVASC ) 10 MG tablet Take 1 tablet (10 mg total) by mouth daily. 30 tablet 1   apixaban  (ELIQUIS ) 5 MG TABS tablet Take 1 tablet (5 mg total) by mouth 2 (two) times daily. 180 tablet 3   Continuous Glucose Sensor (DEXCOM G7 SENSOR) MISC by Does not apply route.  DUPIXENT 300 MG/2ML SOAJ Inject into the skin.     ezetimibe  (ZETIA ) 10 MG tablet Take 10 mg by mouth daily.     glucose blood (ONE TOUCH ULTRA TEST) test strip Use asto check blood sugar 5 times a day 200 each 5   insulin  aspart (NOVOLOG ) 100 UNIT/ML injection USE MAX OF 60  UNITS DAILY VIA INSULIN  PUMP. 50 mL 3   losartan  (COZAAR ) 50 MG tablet Take 1 tablet (50 mg total) by mouth daily. 30 tablet 1   thyroid  (ARMOUR) 15 MG tablet Take 15 mg by mouth daily.     thyroid  (ARMOUR) 30 MG tablet Take 30 mg by mouth daily before breakfast.     benzonatate  (TESSALON ) 100 MG capsule Take 1 capsule (100 mg total) by mouth 3 (three) times daily as needed for cough. (Patient not taking: Reported on 10/13/2024) 20 capsule 0   No current facility-administered medications for this visit.   Facility-Administered Medications Ordered in Other Visits  Medication Dose Route Frequency Provider Last Rate Last Admin   topical emolient (BIAFINE) emulsion   Topical Daily Izell Domino, MD   Given at 05/28/12 1400    PHYSICAL EXAM: Vitals:   10/13/24 0939  BP: 132/62  Weight: 144 lb (65.3 kg)  Height: 5' 5 (1.651 m)       Body mass index is 23.96 kg/m.  Wt Readings from Last 3 Encounters:  10/13/24 144 lb (65.3 kg)  10/12/24 142 lb 12.8 oz (64.8 kg)  09/29/24 152 lb 8.9 oz (69.2 kg)    General: Well developed, well nourished female in no apparent distress.  HEENT: AT/[place], no external lesions.  Eyes: Conjunctiva clear and no icterus. Neck: Neck supple  Lungs: Respirations not labored Neurologic: Alert, oriented, normal speech Extremities / Skin: Dry.  Psychiatric: Does not appear depressed or anxious  Diabetic Foot Exam - Simple   No data filed    LABS Reviewed Lab Results  Component Value Date   HGBA1C 7.4 (A) 10/13/2024   HGBA1C 6.8 (A) 07/08/2024   HGBA1C 7.7 (H) 03/30/2024   No results found for: FRUCTOSAMINE Lab Results  Component Value Date   CHOL 225 (H) 03/30/2024   HDL 82 03/30/2024   LDLCALC 128 (H) 03/30/2024   LDLDIRECT 90.0 05/28/2022   TRIG 60 03/30/2024   CHOLHDL 2.7 03/30/2024   Lab Results  Component Value Date   MICRALBCREAT <30 03/11/2024   MICRALBCREAT 5 09/19/2023   Lab Results  Component Value Date   CREATININE 0.67  10/12/2024   Lab Results  Component Value Date   GFR 62.73 05/21/2023    ASSESSMENT / PLAN  1. Uncontrolled type 1 diabetes mellitus with hyperglycemia (HCC)   2. Adult onset hypothyroidism   3. Hyponatremia     Diabetes Mellitus type 1, complicated by no other  known complications.  - Diabetic status / severity: fair controlled.   Lab Results  Component Value Date   HGBA1C 7.4 (A) 10/13/2024    - Hemoglobin A1c goal <7%.  Frequent postprandial mild hyperglycemia.  Mildly worsening diabetes control with frequent postprandial hyperglycemia.  She has also been ill with influenza and was also hospitalized in mid of December.  Discussed that mild hyperglycemia with blood sugar in low 200 is acceptable postprandially.  Advised patient to avoid frequent manual boluses, to avoid ultimate low blood sugar or low normal blood sugar due to insulin  stacking.  Discussed that control IQ insulin  pump will give auto correctional boluses every 1 hour.  No change  in the pump setting today.  - Medications:   Tandem t:slim insulin  pump on control IQ using NovoLog  U100 : No change in pump setting today.   - Home glucose testing: continue CGM and check blood glucose as needed.   - Discussed/ Gave Hypoglycemia treatment plan.  # Consult : not required at this time.   # Annual urine for microalbuminuria/ creatinine ratio, no microalbuminuria currently. Last  Lab Results  Component Value Date   MICRALBCREAT <30 03/11/2024    # Foot check nightly.  # Annual dilated diabetic eye exams.   - Diet: Make healthy diabetic food choices - Life style / activity / exercise: discussed.   2. Blood pressure  -  BP Readings from Last 1 Encounters:  10/13/24 132/62    - Control is in target.  - No change in current plans.  3. Lipid status / Hyperlipidemia - Last  Lab Results  Component Value Date   LDLCALC 128 (H) 03/30/2024   - Continue Zetia  10 mg daily.  Patient declined statin  therapy.  Consider oral Livalo.  # Primary hypothyroidism        -Continue Armour Thyroid  45 mg daily.  Normal TSH is of 2.50.  # Hyponatremia ?  SIADH -This is mild and asymptomatic.  Will check serum sodium periodically.  Advised for limitation of fluid/water intake.  Serum sodium 134.   Gina [name] was seen today for diabetes.  Diagnoses and all orders for this visit:  Uncontrolled type 1 diabetes mellitus with hyperglycemia (HCC) -     POCT glycosylated hemoglobin (Hb A1C)  Adult onset hypothyroidism  Hyponatremia    DISPOSITION Follow up in clinic in 3  months suggested.    All questions answered and patient verbalized understanding of the plan.  Broox Lonigro, MD [facility] Endocrinology [facility] [address] [place], [address] [place], [place] [address] Phone # [phone]  At least part of this note was generated using voice recognition software. Inadvertent word errors may have occurred, which were not recognized during the proofreading process. "

## 2024-10-14 ENCOUNTER — Other Ambulatory Visit: Payer: Self-pay

## 2024-10-14 DIAGNOSIS — I4891 Unspecified atrial fibrillation: Secondary | ICD-10-CM

## 2024-10-14 DIAGNOSIS — E785 Hyperlipidemia, unspecified: Secondary | ICD-10-CM

## 2024-10-14 DIAGNOSIS — Z789 Other specified health status: Secondary | ICD-10-CM

## 2024-10-14 DIAGNOSIS — I10 Essential (primary) hypertension: Secondary | ICD-10-CM

## 2024-10-14 DIAGNOSIS — R079 Chest pain, unspecified: Secondary | ICD-10-CM

## 2024-10-16 ENCOUNTER — Telehealth: Payer: Self-pay | Admitting: Physician Assistant

## 2024-10-16 ENCOUNTER — Encounter (HOSPITAL_COMMUNITY): Payer: Self-pay | Admitting: *Deleted

## 2024-10-16 NOTE — Telephone Encounter (Signed)
 Pt daughter calling to ask if mother should be seen 1/5 or rescheduled to after her stress test and echo. Please advise.

## 2024-10-16 NOTE — Telephone Encounter (Signed)
 Spoke with pt regarding an appointment after her testing. Pt was ok with seeing Lum Louis, NP on 11/23/24. Pt scheduled and appointment 1/5 canceled. Pt verbalized understanding. All questions if any were answered.

## 2024-10-16 NOTE — Telephone Encounter (Signed)
 Gina [name] requesting c/b at [phone]

## 2024-10-19 ENCOUNTER — Ambulatory Visit: Admitting: Physician Assistant

## 2024-10-19 ENCOUNTER — Inpatient Hospital Stay: Admitting: Family Medicine

## 2024-10-20 ENCOUNTER — Inpatient Hospital Stay: Admitting: Family Medicine

## 2024-10-20 NOTE — Addendum Note (Signed)
 Addended by: REN DONLEY PRADER on: 10/20/2024 08:31 AM   Modules accepted: Orders

## 2024-10-21 ENCOUNTER — Ambulatory Visit (HOSPITAL_COMMUNITY): Admission: RE | Admit: 2024-10-21 | Source: Ambulatory Visit

## 2024-10-21 ENCOUNTER — Encounter (HOSPITAL_COMMUNITY): Payer: Self-pay

## 2024-10-21 DIAGNOSIS — R55 Syncope and collapse: Secondary | ICD-10-CM | POA: Diagnosis not present

## 2024-10-21 NOTE — Addendum Note (Signed)
 Encounter addended by: Wilfred, Kailah Pennel  C, CMA on: 10/21/2024 9:45 AM  Actions taken: Imaging Exam ended

## 2024-10-22 ENCOUNTER — Ambulatory Visit: Payer: Self-pay | Admitting: Physician Assistant

## 2024-10-22 NOTE — Progress Notes (Signed)
 Gina [name]                                          MRN: [id number]   10/22/2024   The [facility] Quality Team Specialist reviewed this patient medical record for the purposes of chart review for care gap closure. The following were reviewed: abstraction for care gap closure-controlling blood pressure.    [facility] Quality Team

## 2024-10-22 NOTE — Progress Notes (Signed)
 Last read by Avelina Stalling at 2:03PM on 10/22/2024.

## 2024-10-27 NOTE — Progress Notes (Signed)
 Patient had significant physical deconditioning on admission, PT was ordered as a result.

## 2024-11-04 ENCOUNTER — Encounter (HOSPITAL_COMMUNITY): Payer: Self-pay

## 2024-11-04 ENCOUNTER — Ambulatory Visit (HOSPITAL_COMMUNITY)
Admission: RE | Admit: 2024-11-04 | Discharge: 2024-11-04 | Disposition: A | Discharge status: Home / Self Care | Source: Ambulatory Visit | Attending: Cardiology | Admitting: Cardiology

## 2024-11-04 ENCOUNTER — Telehealth (HOSPITAL_COMMUNITY): Payer: Self-pay

## 2024-11-04 DIAGNOSIS — I10 Essential (primary) hypertension: Secondary | ICD-10-CM

## 2024-11-04 DIAGNOSIS — E785 Hyperlipidemia, unspecified: Secondary | ICD-10-CM

## 2024-11-04 DIAGNOSIS — Z789 Other specified health status: Secondary | ICD-10-CM

## 2024-11-04 DIAGNOSIS — I4891 Unspecified atrial fibrillation: Secondary | ICD-10-CM

## 2024-11-04 DIAGNOSIS — R079 Chest pain, unspecified: Secondary | ICD-10-CM

## 2024-11-04 NOTE — Telephone Encounter (Signed)
 I called patient to reschedule the ordered Myoview that patient cancelled due to sick and she does not wish to reschedule at this time. She will call us  back when she is ready to reschedule. Order will be removed from the [facility] and if pt calls back we will reinstate the order. Thank you.

## 2024-11-09 ENCOUNTER — Other Ambulatory Visit

## 2024-11-09 DIAGNOSIS — E782 Mixed hyperlipidemia: Secondary | ICD-10-CM

## 2024-11-09 DIAGNOSIS — I10 Essential (primary) hypertension: Secondary | ICD-10-CM

## 2024-11-12 ENCOUNTER — Other Ambulatory Visit

## 2024-11-12 ENCOUNTER — Ambulatory Visit: Admitting: Physician Assistant

## 2024-11-12 DIAGNOSIS — E782 Mixed hyperlipidemia: Secondary | ICD-10-CM

## 2024-11-12 DIAGNOSIS — I10 Essential (primary) hypertension: Secondary | ICD-10-CM

## 2024-11-12 DIAGNOSIS — E109 Type 1 diabetes mellitus without complications: Secondary | ICD-10-CM

## 2024-11-13 LAB — CBC WITH DIFFERENTIAL/PLATELET
Absolute Lymphocytes: 1584 {cells}/uL (ref 850–3900)
Absolute Monocytes: 660 {cells}/uL (ref 200–950)
Basophils Absolute: 77 {cells}/uL (ref 0–200)
Basophils Relative: 1.4 %
Eosinophils Absolute: 88 {cells}/uL (ref 15–500)
Eosinophils Relative: 1.6 %
HCT: 43.3 % (ref 35.9–46.0)
Hemoglobin: 13.7 g/dL (ref 11.7–15.5)
MCH: 28 pg (ref 27.0–33.0)
MCHC: 31.6 g/dL (ref 31.6–35.4)
MCV: 88.5 fL (ref 81.4–101.7)
MPV: 10.5 fL (ref 7.5–12.5)
Monocytes Relative: 12 %
Neutro Abs: 3091 {cells}/uL (ref 1500–7800)
Neutrophils Relative %: 56.2 %
Platelets: 287 10*3/uL (ref 140–400)
RBC: 4.89 Million/uL (ref 3.80–5.10)
RDW: 13.9 % (ref 11.0–15.0)
Total Lymphocyte: 28.8 %
WBC: 5.5 10*3/uL (ref 3.8–10.8)

## 2024-11-13 LAB — HEMOGLOBIN A1C
Hgb A1c MFr Bld: 7.3 % — ABNORMAL HIGH
Mean Plasma Glucose: 163 mg/dL
eAG (mmol/L): 9 mmol/L

## 2024-11-13 LAB — LIPID PANEL
Cholesterol: 238 mg/dL — ABNORMAL HIGH
HDL: 72 mg/dL
LDL Cholesterol (Calc): 149 mg/dL — ABNORMAL HIGH
Non-HDL Cholesterol (Calc): 166 mg/dL — ABNORMAL HIGH
Total CHOL/HDL Ratio: 3.3 (calc)
Triglycerides: 71 mg/dL

## 2024-11-13 LAB — COMPREHENSIVE METABOLIC PANEL WITH GFR
AG Ratio: 1.6 (calc) (ref 1.0–2.5)
ALT: 17 U/L (ref 6–29)
AST: 20 U/L (ref 10–35)
Albumin: 4.1 g/dL (ref 3.6–5.1)
Alkaline phosphatase (APISO): 83 U/L (ref 37–153)
BUN: 15 mg/dL (ref 7–25)
CO2: 33 mmol/L — ABNORMAL HIGH (ref 20–32)
Calcium: 9.7 mg/dL (ref 8.6–10.4)
Chloride: 100 mmol/L (ref 98–110)
Creat: 0.76 mg/dL (ref 0.60–0.95)
Globulin: 2.5 g/dL (ref 1.9–3.7)
Glucose, Bld: 154 mg/dL — ABNORMAL HIGH (ref 65–99)
Potassium: 4.3 mmol/L (ref 3.5–5.3)
Sodium: 138 mmol/L (ref 135–146)
Total Bilirubin: 0.7 mg/dL (ref 0.2–1.2)
Total Protein: 6.6 g/dL (ref 6.1–8.1)
eGFR: 79 mL/min/{1.73_m2}

## 2024-11-16 ENCOUNTER — Ambulatory Visit: Payer: Self-pay | Admitting: Family Medicine

## 2024-11-16 ENCOUNTER — Ambulatory Visit: Admitting: Family Medicine

## 2024-11-17 ENCOUNTER — Encounter (HOSPITAL_COMMUNITY): Payer: Self-pay

## 2024-11-18 ENCOUNTER — Other Ambulatory Visit: Payer: Self-pay | Admitting: Endocrinology

## 2024-11-23 ENCOUNTER — Ambulatory Visit: Admitting: Emergency Medicine

## 2024-12-01 ENCOUNTER — Ambulatory Visit: Admitting: Family Medicine

## 2024-12-03 ENCOUNTER — Encounter (HOSPITAL_COMMUNITY)

## 2025-01-20 ENCOUNTER — Ambulatory Visit: Admitting: Endocrinology
